# Patient Record
Sex: Male | Born: 1950
Health system: Southern US, Community
[De-identification: ages and names within clinical notes are randomized; demographics above are authoritative.]

## PROBLEM LIST (undated history)

## (undated) ENCOUNTER — Encounter: Attending: Hematology & Oncology | Primary: Hematology & Oncology

## (undated) ENCOUNTER — Encounter

## (undated) ENCOUNTER — Telehealth

## (undated) ENCOUNTER — Ambulatory Visit

## (undated) ENCOUNTER — Ambulatory Visit: Payer: MEDICARE

## (undated) ENCOUNTER — Telehealth: Attending: Hospitalist | Primary: Hospitalist

## (undated) ENCOUNTER — Ambulatory Visit: Attending: Family | Primary: Family

## (undated) ENCOUNTER — Telehealth: Attending: Pharmacist | Primary: Pharmacist

## (undated) ENCOUNTER — Encounter: Attending: Pharmacist | Primary: Pharmacist

## (undated) ENCOUNTER — Inpatient Hospital Stay

## (undated) ENCOUNTER — Telehealth: Attending: Emergency Medicine | Primary: Emergency Medicine

## (undated) ENCOUNTER — Encounter: Attending: Emergency Medicine | Primary: Emergency Medicine

## (undated) ENCOUNTER — Telehealth: Attending: Hematology & Oncology | Primary: Hematology & Oncology

## (undated) ENCOUNTER — Ambulatory Visit: Payer: MEDICARE | Attending: Pharmacist | Primary: Pharmacist

## (undated) ENCOUNTER — Encounter: Attending: Infectious Disease | Primary: Infectious Disease

## (undated) DIAGNOSIS — N183 Chronic kidney disease, stage 3 (moderate): Secondary | ICD-10-CM

## (undated) DIAGNOSIS — C9500 Acute leukemia of unspecified cell type not having achieved remission: Secondary | ICD-10-CM

## (undated) DIAGNOSIS — I10 Essential (primary) hypertension: Secondary | ICD-10-CM

## (undated) DIAGNOSIS — E785 Hyperlipidemia, unspecified: Secondary | ICD-10-CM

## (undated) DIAGNOSIS — I639 Cerebral infarction, unspecified: Secondary | ICD-10-CM

## (undated) DIAGNOSIS — E119 Type 2 diabetes mellitus without complications: Secondary | ICD-10-CM

## (undated) HISTORY — DX: Hyperlipidemia, unspecified: E78.5

## (undated) HISTORY — DX: Type 2 diabetes mellitus without complications: E11.9

## (undated) HISTORY — DX: Essential (primary) hypertension: I10

## (undated) HISTORY — DX: Cerebral infarction, unspecified: I63.9

## (undated) HISTORY — PX: TONSILLECTOMY: SUR1361

## (undated) HISTORY — DX: Chronic kidney disease, stage 3 (moderate): N18.3

## (undated) HISTORY — PX: CATARACT EXTRACTION: SUR2

---

## 2007-11-14 ENCOUNTER — Inpatient Hospital Stay: Payer: Self-pay | Admitting: Internal Medicine

## 2007-11-14 ENCOUNTER — Other Ambulatory Visit: Payer: Self-pay

## 2011-03-14 ENCOUNTER — Inpatient Hospital Stay: Payer: Self-pay | Admitting: Internal Medicine

## 2011-04-28 ENCOUNTER — Emergency Department: Payer: Self-pay | Admitting: Emergency Medicine

## 2011-06-21 ENCOUNTER — Emergency Department: Payer: Self-pay | Admitting: Unknown Physician Specialty

## 2013-07-10 ENCOUNTER — Emergency Department: Payer: Self-pay | Admitting: Emergency Medicine

## 2013-07-10 LAB — COMPREHENSIVE METABOLIC PANEL
Albumin: 3.9 g/dL (ref 3.4–5.0)
Alkaline Phosphatase: 78 U/L (ref 50–136)
Anion Gap: 8 (ref 7–16)
BUN: 14 mg/dL (ref 7–18)
Bilirubin,Total: 0.4 mg/dL (ref 0.2–1.0)
Calcium, Total: 9.2 mg/dL (ref 8.5–10.1)
Co2: 26 mmol/L (ref 21–32)
Creatinine: 1.14 mg/dL (ref 0.60–1.30)
Glucose: 229 mg/dL — ABNORMAL HIGH (ref 65–99)
Potassium: 3.9 mmol/L (ref 3.5–5.1)
SGOT(AST): 14 U/L — ABNORMAL LOW (ref 15–37)
Sodium: 135 mmol/L — ABNORMAL LOW (ref 136–145)

## 2013-07-10 LAB — URINALYSIS, COMPLETE
Bilirubin,UR: NEGATIVE
Glucose,UR: >=500 mg/dL (ref 0–75)
Leukocyte Esterase: NEGATIVE
Ph: 5 (ref 4.5–8.0)
RBC,UR: <1 /HPF (ref 0–5)
Specific Gravity: 1.021 (ref 1.003–1.030)

## 2013-07-10 LAB — CBC
HCT: 40.2 % (ref 40.0–52.0)
HGB: 14.6 g/dL (ref 13.0–18.0)
MCV: 93 fL (ref 80–100)
Platelet: 241 10*3/uL (ref 150–440)
RBC: 4.31 10*6/uL — ABNORMAL LOW (ref 4.40–5.90)
RDW: 12.9 % (ref 11.5–14.5)
WBC: 5.5 10*3/uL (ref 3.8–10.6)

## 2013-09-25 ENCOUNTER — Ambulatory Visit: Payer: Self-pay | Admitting: Ophthalmology

## 2014-03-10 ENCOUNTER — Ambulatory Visit: Payer: Self-pay | Admitting: Family Medicine

## 2014-04-04 ENCOUNTER — Ambulatory Visit: Payer: Self-pay | Admitting: Family Medicine

## 2014-05-05 ENCOUNTER — Ambulatory Visit: Payer: Self-pay | Admitting: Family Medicine

## 2014-08-05 ENCOUNTER — Ambulatory Visit: Payer: Self-pay | Admitting: Family Medicine

## 2014-12-29 DIAGNOSIS — I10 Essential (primary) hypertension: Secondary | ICD-10-CM | POA: Diagnosis not present

## 2014-12-29 DIAGNOSIS — Z1389 Encounter for screening for other disorder: Secondary | ICD-10-CM | POA: Diagnosis not present

## 2014-12-29 DIAGNOSIS — E782 Mixed hyperlipidemia: Secondary | ICD-10-CM | POA: Diagnosis not present

## 2014-12-29 DIAGNOSIS — I6930 Unspecified sequelae of cerebral infarction: Secondary | ICD-10-CM | POA: Diagnosis not present

## 2014-12-29 DIAGNOSIS — E1140 Type 2 diabetes mellitus with diabetic neuropathy, unspecified: Secondary | ICD-10-CM | POA: Diagnosis not present

## 2015-04-02 DIAGNOSIS — I10 Essential (primary) hypertension: Secondary | ICD-10-CM | POA: Diagnosis not present

## 2015-04-02 DIAGNOSIS — Z1389 Encounter for screening for other disorder: Secondary | ICD-10-CM | POA: Diagnosis not present

## 2015-04-02 DIAGNOSIS — N183 Chronic kidney disease, stage 3 (moderate): Secondary | ICD-10-CM | POA: Diagnosis not present

## 2015-04-02 DIAGNOSIS — Z9181 History of falling: Secondary | ICD-10-CM | POA: Diagnosis not present

## 2015-04-02 DIAGNOSIS — Z1211 Encounter for screening for malignant neoplasm of colon: Secondary | ICD-10-CM | POA: Diagnosis not present

## 2015-04-02 DIAGNOSIS — E782 Mixed hyperlipidemia: Secondary | ICD-10-CM | POA: Diagnosis not present

## 2015-04-02 DIAGNOSIS — R5383 Other fatigue: Secondary | ICD-10-CM | POA: Diagnosis not present

## 2015-04-02 DIAGNOSIS — E1140 Type 2 diabetes mellitus with diabetic neuropathy, unspecified: Secondary | ICD-10-CM | POA: Diagnosis not present

## 2015-04-28 DIAGNOSIS — Z008 Encounter for other general examination: Secondary | ICD-10-CM | POA: Diagnosis not present

## 2015-05-22 ENCOUNTER — Encounter: Payer: Self-pay | Admitting: Family Medicine

## 2015-05-22 ENCOUNTER — Ambulatory Visit (INDEPENDENT_AMBULATORY_CARE_PROVIDER_SITE_OTHER): Payer: Commercial Managed Care - HMO | Admitting: Family Medicine

## 2015-05-22 VITALS — BP 112/78 | HR 78 | Temp 97.8°F | Resp 16 | Ht 70.0 in | Wt 180.4 lb

## 2015-05-22 DIAGNOSIS — H2000 Unspecified acute and subacute iridocyclitis: Secondary | ICD-10-CM | POA: Diagnosis not present

## 2015-05-22 DIAGNOSIS — E1142 Type 2 diabetes mellitus with diabetic polyneuropathy: Secondary | ICD-10-CM | POA: Insufficient documentation

## 2015-05-22 DIAGNOSIS — E1169 Type 2 diabetes mellitus with other specified complication: Secondary | ICD-10-CM | POA: Insufficient documentation

## 2015-05-22 DIAGNOSIS — I10 Essential (primary) hypertension: Secondary | ICD-10-CM | POA: Insufficient documentation

## 2015-05-22 DIAGNOSIS — E785 Hyperlipidemia, unspecified: Secondary | ICD-10-CM

## 2015-05-22 DIAGNOSIS — H5713 Ocular pain, bilateral: Secondary | ICD-10-CM | POA: Diagnosis not present

## 2015-05-22 NOTE — Patient Instructions (Signed)
I made you an appt at [facility] at 115 pm today.

## 2015-05-22 NOTE — Progress Notes (Signed)
Subjective:    Patient ID: Richard Gordon, male    DOB: 1951-07-05, 64 y.o.   MRN: [id number]  HPI: Richard Gordon is a 64 y.o. male presenting on 05/22/2015 for Eye Pain   Eye Pain  Both eyes are affected.This is a new problem. The current episode started yesterday. The problem occurs intermittently. The problem has been waxing and waning. There was no injury mechanism. The pain is at a severity of 5/10. There is no known exposure to pink eye. Associated symptoms include eye redness and photophobia. Pertinent negatives include no blurred vision, eye discharge, fever, foreign body sensation, itching, recent URI or vomiting.   Eye pain began last night. Occurs when pt looks as bright light. No exudate or itching from the eye. No watering, no foreign body sensation. Pt has eye exam scheduled on 6/28.     Past Medical History  Diagnosis Date  . Diabetes mellitus without complication   . Hypertension   . Hyperlipidemia     No current outpatient prescriptions on file prior to visit.   No current facility-administered medications on file prior to visit.    Review of Systems  Constitutional: Negative for fever and chills.  HENT: Negative.   Eyes: Positive for photophobia, pain and redness. Negative for blurred vision and discharge.  Respiratory: Negative for chest tightness and shortness of breath.   Cardiovascular: Negative for chest pain, palpitations and leg swelling.  Gastrointestinal: Negative for vomiting.  Genitourinary: Negative.   Musculoskeletal: Negative.   Skin: Negative for itching.  Neurological: Negative for dizziness and light-headedness.   Per HPI unless specifically indicated above     Objective:    BP 112/78 mmHg  Pulse 78  Temp(Src) 97.8 F (36.6 C) (Oral)  Resp 16  Ht 5' 10" (1.778 m)  Wt 180 lb 6.4 oz (81.829 kg)  BMI 25.88 kg/m2  Wt Readings from Last 3 Encounters:  05/22/15 180 lb 6.4 oz (81.829 kg)    Physical Exam  Constitutional: He appears  well-developed and well-nourished. No distress.  Eyes: EOM and lids are normal. Pupils are equal, round, and reactive to light. No scleral icterus. Right eye exhibits no nystagmus. Left eye exhibits no nystagmus.  Both eyes mildly red. No exudate.   Cardiovascular: Normal rate and regular rhythm.  Exam reveals no gallop and no friction rub.   No murmur heard. Skin: He is not diaphoretic.   No results found for this or any previous visit.    Assessment & Plan:   Problem List Items Addressed This Visit    None    Visit Diagnoses    Eye pain, bilateral    -  Primary    No sign of infection. Due to eye pain and light sensitivity, refer to [facility] for evaluation.     Relevant Orders    Ambulatory referral to Ophthalmology       Meds ordered this encounter  Medications  . glimepiride (AMARYL) 4 MG tablet    Sig: Take 4 mg by mouth daily with breakfast.  . simvastatin (ZOCOR) 20 MG tablet    Sig: Take 20 mg by mouth daily.  . hydrochlorothiazide (HYDRODIURIL) 25 MG tablet    Sig: Take 25 mg by mouth daily.  Marland Kitchen lisinopril (PRINIVIL,ZESTRIL) 20 MG tablet    Sig: Take 20 mg by mouth daily.  . metFORMIN (GLUCOPHAGE) 1000 MG tablet    Sig: Take 1,000 mg by mouth 2 (two) times daily with a meal.  .  aspirin 325 MG tablet    Sig: Take 325 mg by mouth daily.      Follow up plan: Return if symptoms worsen or fail to improve.

## 2015-07-09 ENCOUNTER — Encounter: Payer: Self-pay | Admitting: Family Medicine

## 2015-07-09 ENCOUNTER — Ambulatory Visit (INDEPENDENT_AMBULATORY_CARE_PROVIDER_SITE_OTHER): Payer: Commercial Managed Care - HMO | Admitting: Family Medicine

## 2015-07-09 VITALS — BP 136/64 | HR 72 | Resp 16 | Ht 69.0 in | Wt 183.0 lb

## 2015-07-09 DIAGNOSIS — E119 Type 2 diabetes mellitus without complications: Secondary | ICD-10-CM

## 2015-07-09 DIAGNOSIS — E785 Hyperlipidemia, unspecified: Secondary | ICD-10-CM | POA: Diagnosis not present

## 2015-07-09 DIAGNOSIS — I10 Essential (primary) hypertension: Secondary | ICD-10-CM | POA: Diagnosis not present

## 2015-07-09 DIAGNOSIS — E1169 Type 2 diabetes mellitus with other specified complication: Secondary | ICD-10-CM | POA: Insufficient documentation

## 2015-07-09 LAB — POCT GLYCOSYLATED HEMOGLOBIN (HGB A1C): Hemoglobin A1C: 6.7

## 2015-07-09 NOTE — Assessment & Plan Note (Signed)
Reduce statin to 75m daily due to very low LDL (27).  Recheck at next visit.  Continue to encourage the heart healthy diet.

## 2015-07-09 NOTE — Assessment & Plan Note (Addendum)
Controlled continue current medication regimen. Recheck A1c in 3 mos. Step back medications  If continues to drop.  Eye exam UTD. Foot exam UTD. ACE/ARB for renal protection.

## 2015-07-09 NOTE — Progress Notes (Signed)
Subjective:    Patient ID: Richard Gordon, male    DOB: November 02, 1951, 64 y.o.   MRN: [id number]  HPI: Richard Gordon is a 64 y.o. male presenting on 07/09/2015 for Follow-up and Diabetes   Diabetes He presents for his follow-up diabetic visit. He has type 2 diabetes mellitus. His disease course has been improving. There are no hypoglycemic associated symptoms. Pertinent negatives for hypoglycemia include no headaches. Pertinent negatives for diabetes include no chest pain, no fatigue, no foot paresthesias, no polydipsia, no polyphagia and no polyuria. Diabetic complications include a CVA. He is following a diabetic diet. His overall blood glucose range is 130-140 mg/dl. An ACE inhibitor/angiotensin II receptor blocker is being taken. He does not see a podiatrist.Eye exam is current.    Past Medical History  Diagnosis Date  . Hypertension   . Hyperlipidemia   . Diabetes mellitus without complication   . Stroke     No current outpatient prescriptions on file prior to visit.   No current facility-administered medications on file prior to visit.    Review of Systems  Constitutional: Negative for fever, chills and fatigue.  Respiratory: Negative for chest tightness, shortness of breath and wheezing.   Cardiovascular: Negative for chest pain.  Gastrointestinal: Negative.  Negative for nausea, vomiting and abdominal pain.  Endocrine: Negative for cold intolerance, heat intolerance, polydipsia, polyphagia and polyuria.  Genitourinary: Negative for dysuria.  Musculoskeletal: Negative.   Neurological: Negative for light-headedness, numbness and headaches.  Psychiatric/Behavioral: Negative.    Per HPI unless specifically indicated above     Objective:    BP 136/64 mmHg  Pulse 72  Resp 16  Ht 5' 9" (1.753 m)  Wt 183 lb (83.008 kg)  BMI 27.01 kg/m2  Wt Readings from Last 3 Encounters:  07/09/15 183 lb (83.008 kg)    Physical Exam  Constitutional: He is oriented to person, place, and  time. He appears well-developed and well-nourished. No distress.  Neck: Normal range of motion. Neck supple. No thyromegaly present.  Cardiovascular: Normal rate and regular rhythm.  Exam reveals no gallop and no friction rub.   No murmur heard. Pulmonary/Chest: Effort normal and breath sounds normal.  Abdominal: Soft. Bowel sounds are normal. There is no tenderness. There is no rebound.  Musculoskeletal: Normal range of motion. He exhibits no edema or tenderness.  Lymphadenopathy:    He has no cervical adenopathy.  Neurological: He is alert and oriented to person, place, and time.  Skin: Skin is warm and dry. He is not diaphoretic.   Diabetic Foot Exam - Simple   Simple Foot Form  Diabetic Foot exam was performed with the following findings:  Yes 07/09/2015  3:37 PM  Visual Inspection  No deformities, no ulcerations, no other skin breakdown bilaterally:  Yes  Sensation Testing  Intact to touch and monofilament testing bilaterally:  Yes  Pulse Check  Posterior Tibialis and Dorsalis pulse intact bilaterally:  Yes  Comments      Results for orders placed or performed in visit on 07/09/15  POCT HgB A1C  Result Value Ref Range   Hemoglobin A1C 6.7       Assessment & Plan:   Problem List Items Addressed This Visit      Cardiovascular and Mediastinum   Hypertension    Controlled- continue current regimen.       Relevant Medications   hydrochlorothiazide (HYDRODIURIL) 25 MG tablet   lisinopril (PRINIVIL,ZESTRIL) 20 MG tablet   simvastatin (ZOCOR) 20 MG tablet   Other Relevant  Orders   Basic Metabolic Panel (BMET)     Endocrine   Diabetes type 2, controlled - Primary    Controlled continue current medication regimen. Recheck A1c in 3 mos. Step back medications  If continues to drop.  Eye exam UTD. Foot exam UTD. ACE/ARB for renal protection.       Relevant Medications   glimepiride (AMARYL) 4 MG tablet   lisinopril (PRINIVIL,ZESTRIL) 20 MG tablet   metFORMIN  (GLUCOPHAGE) 1000 MG tablet   simvastatin (ZOCOR) 20 MG tablet   Other Relevant Orders   POCT HgB A1C (Completed)   Basic Metabolic Panel (BMET)     Other   Hyperlipidemia    Reduce statin to 7m daily due to very low LDL (27).  Recheck at next visit.  Continue to encourage the heart healthy diet.       Relevant Medications   hydrochlorothiazide (HYDRODIURIL) 25 MG tablet   lisinopril (PRINIVIL,ZESTRIL) 20 MG tablet   simvastatin (ZOCOR) 20 MG tablet      Meds ordered this encounter  Medications  . glimepiride (AMARYL) 4 MG tablet    Sig:   . hydrochlorothiazide (HYDRODIURIL) 25 MG tablet    Sig:   . lisinopril (PRINIVIL,ZESTRIL) 20 MG tablet    Sig:   . metFORMIN (GLUCOPHAGE) 1000 MG tablet    Sig:   . simvastatin (ZOCOR) 20 MG tablet    Sig:   . Vitamins A & D 5000-400 UNITS CAPS    Sig:       Follow up plan: Return in about 3 months (around 10/09/2015), or if symptoms worsen or fail to improve.

## 2015-07-09 NOTE — Assessment & Plan Note (Signed)
Controlled- continue current regimen.

## 2015-07-09 NOTE — Patient Instructions (Signed)
Cholesterol: Take 1/2 each evening for your cholesterol. And We will recheck in November.  Great job on getting your A1c down to 6.7%  Dr. Dorothey Baseman Office: [facility] [place] [phone]

## 2015-07-10 LAB — BASIC METABOLIC PANEL
BUN / CREAT RATIO: 16 (ref 10–22)
BUN: 23 mg/dL (ref 8–27)
CO2: 23 mmol/L (ref 18–29)
CREATININE: 1.42 mg/dL — AB (ref 0.76–1.27)
Calcium: 10.4 mg/dL — ABNORMAL HIGH (ref 8.6–10.2)
Chloride: 98 mmol/L (ref 97–108)
GFR calc Af Amer: 59 mL/min/{1.73_m2} — ABNORMAL LOW (ref >59)
GFR calc non Af Amer: 51 mL/min/{1.73_m2} — ABNORMAL LOW (ref >59)
Glucose: 133 mg/dL — ABNORMAL HIGH (ref 65–99)
Potassium: 4.7 mmol/L (ref 3.5–5.2)
SODIUM: 141 mmol/L (ref 134–144)

## 2015-08-14 ENCOUNTER — Encounter: Payer: Self-pay | Admitting: Family Medicine

## 2015-08-28 ENCOUNTER — Other Ambulatory Visit: Payer: Self-pay | Admitting: Family Medicine

## 2015-11-03 ENCOUNTER — Ambulatory Visit (INDEPENDENT_AMBULATORY_CARE_PROVIDER_SITE_OTHER): Payer: Commercial Managed Care - HMO | Admitting: Family Medicine

## 2015-11-03 ENCOUNTER — Encounter: Payer: Self-pay | Admitting: Family Medicine

## 2015-11-03 VITALS — BP 114/72 | HR 83 | Temp 98.3°F | Resp 16 | Ht 70.0 in | Wt 185.0 lb

## 2015-11-03 DIAGNOSIS — Z23 Encounter for immunization: Secondary | ICD-10-CM

## 2015-11-03 DIAGNOSIS — E1142 Type 2 diabetes mellitus with diabetic polyneuropathy: Secondary | ICD-10-CM | POA: Diagnosis not present

## 2015-11-03 DIAGNOSIS — E785 Hyperlipidemia, unspecified: Secondary | ICD-10-CM

## 2015-11-03 DIAGNOSIS — N183 Chronic kidney disease, stage 3 unspecified: Secondary | ICD-10-CM

## 2015-11-03 DIAGNOSIS — I6930 Unspecified sequelae of cerebral infarction: Secondary | ICD-10-CM | POA: Diagnosis not present

## 2015-11-03 DIAGNOSIS — R5383 Other fatigue: Secondary | ICD-10-CM | POA: Insufficient documentation

## 2015-11-03 DIAGNOSIS — E1169 Type 2 diabetes mellitus with other specified complication: Secondary | ICD-10-CM

## 2015-11-03 DIAGNOSIS — I129 Hypertensive chronic kidney disease with stage 1 through stage 4 chronic kidney disease, or unspecified chronic kidney disease: Secondary | ICD-10-CM

## 2015-11-03 DIAGNOSIS — L209 Atopic dermatitis, unspecified: Secondary | ICD-10-CM | POA: Insufficient documentation

## 2015-11-03 HISTORY — DX: Chronic kidney disease, stage 3 unspecified: N18.30

## 2015-11-03 LAB — POCT GLYCOSYLATED HEMOGLOBIN (HGB A1C): HEMOGLOBIN A1C: 7.7

## 2015-11-03 MED ORDER — SITAGLIPTIN PHOSPHATE 100 MG PO TABS: 100.0000 mg | ORAL_TABLET | Freq: Every day | ORAL | Status: DC

## 2015-11-03 MED ORDER — ALPHA-LIPOIC ACID 600 MG PO CAPS: 1.0000 | ORAL_CAPSULE | Freq: Every day | ORAL | Status: DC

## 2015-11-03 NOTE — Assessment & Plan Note (Signed)
A1C is elevated. Continue metformin. Change Amaryl to Januvia to help control A1c. Encouraged pt to make diet changes to help control A1c. Encouraged carb counting and avoiding concentrated sweets. Encouraged continued exercise.  ACE for renal protection. Eye exam UTD. Foot exam done.  RTC 3 mos.

## 2015-11-03 NOTE — Patient Instructions (Addendum)
I have changed your diabetes medications to see if we can reduce your A1c.  We also need to change a few things in your diet- Reduce the number of times you are going to the grill. Increase veggies and reduce breads, rice, and pasta. Watch concentrated sweets such as cakes and cookies.   Diabetes Mellitus and Food It is important for you to manage your blood sugar (glucose) level. Your blood glucose level can be greatly affected by what you eat. Eating healthier foods in the appropriate amounts throughout the day at about the same time each day will help you control your blood glucose level. It can also help slow or prevent worsening of your diabetes mellitus. Healthy eating may even help you improve the level of your blood pressure and reach or maintain a healthy weight.  General recommendations for healthful eating and cooking habits include:  Eating meals and snacks regularly. Avoid going long periods of time without eating to lose weight.  Eating a diet that consists mainly of plant-based foods, such as fruits, vegetables, nuts, legumes, and whole grains.  Using low-heat cooking methods, such as baking, instead of high-heat cooking methods, such as deep frying. Work with your dietitian to make sure you understand how to use the Nutrition Facts information on food labels. HOW CAN FOOD AFFECT ME? Carbohydrates Carbohydrates affect your blood glucose level more than any other type of food. Your dietitian will help you determine how many carbohydrates to eat at each meal and teach you how to count carbohydrates. Counting carbohydrates is important to keep your blood glucose at a healthy level, especially if you are using insulin or taking certain medicines for diabetes mellitus. Alcohol Alcohol can cause sudden decreases in blood glucose (hypoglycemia), especially if you use insulin or take certain medicines for diabetes mellitus. Hypoglycemia can be a life-threatening condition. Symptoms of  hypoglycemia (sleepiness, dizziness, and disorientation) are similar to symptoms of having too much alcohol.  If your health care provider has given you approval to drink alcohol, do so in moderation and use the following guidelines:  Women should not have more than one drink per day, and men should not have more than two drinks per day. One drink is equal to:  12 oz of beer.  5 oz of wine.  1 oz of hard liquor.  Do not drink on an empty stomach.  Keep yourself hydrated. Have water, diet soda, or unsweetened iced tea.  Regular soda, juice, and other mixers might contain a lot of carbohydrates and should be counted. WHAT FOODS ARE NOT RECOMMENDED? As you make food choices, it is important to remember that all foods are not the same. Some foods have fewer nutrients per serving than other foods, even though they might have the same number of calories or carbohydrates. It is difficult to get your body what it needs when you eat foods with fewer nutrients. Examples of foods that you should avoid that are high in calories and carbohydrates but low in nutrients include:  Trans fats (most processed foods list trans fats on the Nutrition Facts label).  Regular soda.  Juice.  Candy.  Sweets, such as cake, pie, doughnuts, and cookies.  Fried foods. WHAT FOODS CAN I EAT? Eat nutrient-rich foods, which will nourish your body and keep you healthy. The food you should eat also will depend on several factors, including:  The calories you need.  The medicines you take.  Your weight.  Your blood glucose level.  Your blood pressure level.  Your cholesterol level. You should eat a variety of foods, including:  Protein.  Lean cuts of meat.  Proteins low in saturated fats, such as fish, egg whites, and beans. Avoid processed meats.  Fruits and vegetables.  Fruits and vegetables that may help control blood glucose levels, such as apples, mangoes, and yams.  Dairy products.  Choose  fat-free or low-fat dairy products, such as milk, yogurt, and cheese.  Grains, bread, pasta, and rice.  Choose whole grain products, such as multigrain bread, whole oats, and brown rice. These foods may help control blood pressure.  Fats.  Foods containing healthful fats, such as nuts, avocado, olive oil, canola oil, and fish. DOES EVERYONE WITH DIABETES MELLITUS HAVE THE SAME MEAL PLAN? Because every person with diabetes mellitus is different, there is not one meal plan that works for everyone. It is very important that you meet with a dietitian who will help you create a meal plan that is just right for you.   This information is not intended to replace advice given to you by your health care provider. Make sure you discuss any questions you have with your health care provider.   Document Released: 08/18/2005 Document Revised: 12/12/2014 Document Reviewed: 10/18/2013 Elsevier Interactive Patient Education [employer].

## 2015-11-03 NOTE — Progress Notes (Signed)
Subjective:    Patient ID: Richard Gordon, male    DOB: 02/07/1951, 64 y.o.   MRN: [id number]  HPI: Richard Gordon is a 64 y.o. male presenting on 11/03/2015 for Hypertension and Diabetes   Hypertension Pertinent negatives include no chest pain, headaches or shortness of breath.  Diabetes He presents for his follow-up diabetic visit. He has type 2 diabetes mellitus. His disease course has been fluctuating. There are no hypoglycemic associated symptoms. Pertinent negatives for hypoglycemia include no headaches. Associated symptoms include foot paresthesias. Pertinent negatives for diabetes include no chest pain, no fatigue, no polydipsia, no polyphagia, no polyuria, no visual change, no weakness and no weight loss. Symptoms are worsening. Risk factors for coronary artery disease include dyslipidemia, hypertension, male sex and diabetes mellitus. Current diabetic treatment includes oral agent (dual therapy). His overall blood glucose range is 140-180 mg/dl.    Pt presents for diabetes follow-up. Last A1c was 6.7%.  Taking glimepiride 23m daily and metformin.  Pt reports he has changed his diet, not eating as healthy. Eating out 4 times per week.  Sugars avg 120-135- checks in the morning and the afternoon. Numbness in toes is increased due to cold weather.    Past Medical History  Diagnosis Date  . Hypertension   . Hyperlipidemia   . Diabetes mellitus without complication ([place]   . Stroke ([facility]     Current Outpatient Prescriptions on File Prior to Visit  Medication Sig  . aspirin 325 MG tablet Take 325 mg by mouth daily.  . hydrochlorothiazide (HYDRODIURIL) 25 MG tablet   . hydrochlorothiazide (HYDRODIURIL) 25 MG tablet Take 25 mg by mouth daily.  .Marland Kitchenlisinopril (PRINIVIL,ZESTRIL) 20 MG tablet   . lisinopril (PRINIVIL,ZESTRIL) 20 MG tablet Take 20 mg by mouth daily.  . metFORMIN (GLUCOPHAGE) 1000 MG tablet Take 1,000 mg by mouth 2 (two) times daily with a meal.  . simvastatin (ZOCOR) 20  MG tablet   . simvastatin (ZOCOR) 20 MG tablet Take 20 mg by mouth daily.  . Vitamins A & D 5000-400 UNITS CAPS    No current facility-administered medications on file prior to visit.    Review of Systems  Constitutional: Negative for fever, chills, weight loss and fatigue.  Respiratory: Negative for chest tightness, shortness of breath and wheezing.   Cardiovascular: Negative for chest pain.  Gastrointestinal: Negative.   Endocrine: Negative for cold intolerance, heat intolerance, polydipsia, polyphagia and polyuria.  Genitourinary: Negative.   Musculoskeletal: Negative.  Negative for myalgias and arthralgias.  Neurological: Negative for weakness, light-headedness, numbness and headaches.  Psychiatric/Behavioral: Negative.    Per HPI unless specifically indicated above     Objective:    BP 114/72 mmHg  Pulse 83  Temp(Src) 98.3 F (36.8 C) (Oral)  Resp 16  Ht 5' 10" (1.778 m)  Wt 185 lb (83.915 kg)  BMI 26.54 kg/m2  Wt Readings from Last 3 Encounters:  11/03/15 185 lb (83.915 kg)  07/09/15 183 lb (83.008 kg)  05/22/15 180 lb 6.4 oz (81.829 kg)    Physical Exam  Constitutional: He is oriented to person, place, and time. He appears well-developed and well-nourished. No distress.  Neck: Normal range of motion. Neck supple. No thyromegaly present.  Cardiovascular: Normal rate and regular rhythm.  Exam reveals no gallop and no friction rub.   No murmur heard. Pulmonary/Chest: Effort normal and breath sounds normal.  Abdominal: Soft. Bowel sounds are normal. There is no tenderness. There is no rebound.  Musculoskeletal: Normal range of motion.  He exhibits no edema or tenderness.  Lymphadenopathy:    He has no cervical adenopathy.  Neurological: He is alert and oriented to person, place, and time.  Skin: Skin is warm and dry. He is not diaphoretic.   Results for orders placed or performed in visit on 11/03/15  POCT HgB A1C  Result Value Ref Range   Hemoglobin A1C 7.7     Diabetic Foot Exam - Simple   Simple Foot Form  Diabetic Foot exam was performed with the following findings:  Yes 11/03/2015  2:05 PM  Visual Inspection  No deformities, no ulcerations, no other skin breakdown bilaterally:  Yes  Sensation Testing  Intact to touch and monofilament testing bilaterally:  Yes  Pulse Check  Posterior Tibialis and Dorsalis pulse intact bilaterally:  Yes  Comments         Assessment & Plan:   Problem List Items Addressed This Visit      Cardiovascular and Mediastinum   Hypertension     Nervous and Auditory   DM type 2 with diabetic peripheral neuropathy ([name])    A1C is elevated. Continue metformin. Change Amaryl to Januvia to help control A1c. Encouraged pt to make diet changes to help control A1c. Encouraged carb counting and avoiding concentrated sweets. Encouraged continued exercise.  ACE for renal protection. Eye exam UTD. Foot exam done.  RTC 3 mos.       Relevant Medications   sitaGLIPtin (JANUVIA) 100 MG tablet   Alpha-Lipoic Acid 600 MG CAPS   Other Relevant Orders   POCT HgB A1C (Completed)     Genitourinary   Chronic kidney disease (CKD), stage III (moderate)    Advised to avoid NSAIDs. Check BMP today. ACE for renal protection.         Other   Hyperlipidemia associated with type 2 diabetes mellitus ([place])    Check lipid panel today. Continue statin. Aspirin for ASCVD prevention.       Relevant Medications   sitaGLIPtin (JANUVIA) 100 MG tablet   Other Relevant Orders   Lipid panel   Personal history of stroke with residual effects - Primary    Secondary prevention with aspirin and statin.        Other Visit Diagnoses    Need for Streptococcus pneumoniae vaccination        Relevant Orders    Pneumococcal conjugate vaccine 13-valent (Completed)       Meds ordered this encounter  Medications  . Omega-3 Fatty Acids (FISH OIL) 1200 MG CAPS    Sig: Take by mouth.  . sitaGLIPtin (JANUVIA) 100 MG tablet    Sig: Take 1  tablet (100 mg total) by mouth daily.    Dispense:  90 tablet    Refill:  3    Order Specific Question:  Supervising Provider    Answer:  Arlis Porta [phone]  . Alpha-Lipoic Acid 600 MG CAPS    Sig: Take 1 capsule (600 mg total) by mouth daily.    Dispense:  90 each    Refill:  3    Order Specific Question:  Supervising Provider    Answer:  Arlis Porta [[id number]]      Follow up plan: Return in about 3 months (around 03-01-2016).

## 2015-11-03 NOTE — Assessment & Plan Note (Signed)
Check lipid panel today. Continue statin. Aspirin for ASCVD prevention.

## 2015-11-03 NOTE — Assessment & Plan Note (Signed)
Secondary prevention with aspirin and statin.

## 2015-11-03 NOTE — Assessment & Plan Note (Addendum)
Advised to avoid NSAIDs. Check BMP today. ACE for renal protection.

## 2015-11-12 DIAGNOSIS — I10 Essential (primary) hypertension: Secondary | ICD-10-CM | POA: Diagnosis not present

## 2015-11-12 DIAGNOSIS — E785 Hyperlipidemia, unspecified: Secondary | ICD-10-CM | POA: Diagnosis not present

## 2015-11-13 ENCOUNTER — Telehealth: Payer: Self-pay | Admitting: Family Medicine

## 2015-11-13 DIAGNOSIS — E785 Hyperlipidemia, unspecified: Secondary | ICD-10-CM

## 2015-11-13 LAB — LIPID PANEL
CHOL/HDL RATIO: 12.5 ratio — AB (ref 0.0–5.0)
Cholesterol, Total: 150 mg/dL (ref 100–199)
HDL: 12 mg/dL — AB (ref >39)
Triglycerides: 1141 mg/dL (ref 0–149)

## 2015-11-13 LAB — BASIC METABOLIC PANEL
BUN / CREAT RATIO: 21 (ref 10–22)
BUN: 24 mg/dL (ref 8–27)
CO2: 20 mmol/L (ref 18–29)
Calcium: 9.2 mg/dL (ref 8.6–10.2)
Chloride: 99 mmol/L (ref 97–106)
Creatinine, Ser: 1.17 mg/dL (ref 0.76–1.27)
GFR calc non Af Amer: 65 mL/min/{1.73_m2} (ref >59)
GFR, EST AFRICAN AMERICAN: 75 mL/min/{1.73_m2} (ref >59)
Glucose: 290 mg/dL — ABNORMAL HIGH (ref 65–99)
Potassium: 4.5 mmol/L (ref 3.5–5.2)
Sodium: 139 mmol/L (ref 136–144)

## 2015-11-13 NOTE — Telephone Encounter (Signed)
Lab orders placed.

## 2015-11-17 ENCOUNTER — Telehealth: Payer: Self-pay | Admitting: Family Medicine

## 2015-11-17 DIAGNOSIS — E1142 Type 2 diabetes mellitus with diabetic polyneuropathy: Secondary | ICD-10-CM

## 2015-11-17 NOTE — Telephone Encounter (Signed)
Pt said [facility] mail order pharmacy does not have alpha lipoic acid.  The sitagliptin 100 mg is too expensive so he wants to go back to the glimepiride 4 mg.  His call back number is [phone].

## 2015-11-18 MED ORDER — GLIMEPIRIDE 4 MG PO TABS: 8.0000 mg | ORAL_TABLET | Freq: Every day | ORAL | Status: DC

## 2015-11-18 NOTE — Telephone Encounter (Signed)
Please let Richard Gordon know I have sent in the dose of glimepiride 53m (take 2 pills daily) to his mail order pharmacy. He can get alpha lipoic acid over the counter. He can even order it on [employer] I can also try to send it to his local pharmacy to see if that's available.   Thanks! AK

## 2015-11-18 NOTE — Telephone Encounter (Signed)
Left detailed message on [email or web address]

## 2015-11-24 ENCOUNTER — Telehealth: Payer: Self-pay | Admitting: Family Medicine

## 2015-11-24 NOTE — Telephone Encounter (Signed)
Pt said he received to [place] for the mail order pharmacy at no charge so he did decide to take it instead of the other medication.

## 2015-11-24 NOTE — Telephone Encounter (Signed)
That is okay. He can continue Januvia. I will cancel the glimepiride. Thanks! AK

## 2015-12-29 ENCOUNTER — Other Ambulatory Visit: Payer: Self-pay | Admitting: Family Medicine

## 2016-01-15 ENCOUNTER — Other Ambulatory Visit: Payer: Self-pay | Admitting: Family Medicine

## 2016-02-02 ENCOUNTER — Ambulatory Visit: Payer: Self-pay | Admitting: Family Medicine

## 2016-02-04 ENCOUNTER — Ambulatory Visit (INDEPENDENT_AMBULATORY_CARE_PROVIDER_SITE_OTHER): Payer: Commercial Managed Care - HMO | Admitting: Family Medicine

## 2016-02-04 DIAGNOSIS — I10 Essential (primary) hypertension: Secondary | ICD-10-CM | POA: Diagnosis not present

## 2016-02-04 DIAGNOSIS — E1142 Type 2 diabetes mellitus with diabetic polyneuropathy: Secondary | ICD-10-CM | POA: Diagnosis not present

## 2016-02-04 DIAGNOSIS — E785 Hyperlipidemia, unspecified: Secondary | ICD-10-CM | POA: Diagnosis not present

## 2016-02-04 DIAGNOSIS — H53149 Visual discomfort, unspecified: Secondary | ICD-10-CM

## 2016-02-04 DIAGNOSIS — E1169 Type 2 diabetes mellitus with other specified complication: Secondary | ICD-10-CM

## 2016-02-04 DIAGNOSIS — H5319 Other subjective visual disturbances: Secondary | ICD-10-CM

## 2016-02-04 LAB — POCT GLYCOSYLATED HEMOGLOBIN (HGB A1C): HEMOGLOBIN A1C: 8.2

## 2016-02-04 MED ORDER — INSULIN GLARGINE 100 UNIT/ML SOLOSTAR PEN: 10.0000 [IU] | PEN_INJECTOR | Freq: Every day | SUBCUTANEOUS | Status: DC

## 2016-02-04 MED ORDER — INSULIN PEN NEEDLE 31G X 5 MM MISC: 1.0000 | Freq: Every day | Status: DC

## 2016-02-04 NOTE — Progress Notes (Signed)
Subjective:    Patient ID: Richard Gordon, male    DOB: [birth year], 65 y.o.   MRN: [id number]  HPI: Richard Gordon is a 65 y.o. male presenting on 02/04/2016 for Diabetes   HPI   Here for diabetes follow up.  Diabetes - Last opthalmology appointment was 1 year ago. A1C 8.2 today, up from 6.7. Taking metformin and jenuvia as prescribed. Avg blood sugar is 130 per patient report. Checks twice per week, once in the morning and once in the evening. Left side tingling r/t CVA in the past. Slight tingling in BLE that is not especially bothersome. Monofilament exam with no decreased sensation. Walking 1 mile per day. Light sensitivity:  he has had photophobia for the past month, ever since he got a cold. Once the photophobia was so bad that it made him nauseated. He has had this issue in the past. Saw eye doctor for it in the past. It stopped for a while and has recently restarted.   Hypertryglycerides - TG >1100 on last lipid panel. Pt had just eaten an energy bar prior to draw. 24 hour diet recall: pancakes and oranges this morning and two hamburgers yesterday evening. No stomach pain presently or with food.  HTN - No CP/SOB or swelling. No lightheadedness, dizziness, headaches, or vision changes other than photophobia. Did get a little dizzy with standing up when he had the cold a month ago.    Past Medical History  Diagnosis Date  . Hypertension   . Hyperlipidemia   . Diabetes mellitus without complication ([place])   . Stroke [facility])     Current Outpatient Prescriptions on File Prior to Visit  Medication Sig  . aspirin 325 MG tablet Take 325 mg by mouth daily.  . hydrochlorothiazide (HYDRODIURIL) 25 MG tablet Take 25 mg by mouth daily.  Marland Kitchen lisinopril (PRINIVIL,ZESTRIL) 20 MG tablet TAKE 1 TABLET EVERY DAY  . metFORMIN (GLUCOPHAGE) 1000 MG tablet TAKE 1 TABLET TWICE DAILY  . Omega-3 Fatty Acids (FISH OIL) 1200 MG CAPS Take by mouth.  . simvastatin (ZOCOR) 20 MG tablet TAKE 1 TABLET NIGHTLY.    . sitaGLIPtin (JANUVIA) 100 MG tablet Take 1 tablet (100 mg total) by mouth daily.   No current facility-administered medications on file prior to visit.    Review of Systems  Constitutional: Negative for fever, chills, diaphoresis, activity change, appetite change and fatigue.  HENT: Negative for ear pain, hearing loss, rhinorrhea, sinus pressure, sore throat and tinnitus.   Eyes: Positive for photophobia. Negative for pain and visual disturbance.  Respiratory: Negative for cough, chest tightness and shortness of breath.   Cardiovascular: Negative for chest pain and leg swelling.  Gastrointestinal: Positive for nausea (once with photophobia). Negative for vomiting, abdominal pain, diarrhea, constipation and abdominal distention.  Endocrine: Negative for polydipsia, polyphagia and polyuria.  Genitourinary: Negative for dysuria, urgency, decreased urine volume and difficulty urinating.  Musculoskeletal: Negative for myalgias, back pain, joint swelling, arthralgias and gait problem.  Skin: Negative for color change.  Neurological: Positive for dizziness and numbness (numbness/tingling to LUE and LLE r/t old CVA). Negative for weakness, light-headedness and headaches.  Psychiatric/Behavioral: Negative for behavioral problems and agitation. The patient is not nervous/anxious.    Per HPI unless specifically indicated above     Objective:    BP 114/73 mmHg  Pulse 80  Temp(Src) 97.7 F (36.5 C) (Oral)  Resp 16  Ht 5' 10" (1.778 m)  Wt 179 lb (81.194 kg)  BMI 25.68 kg/m2  Wt  Readings from Last 3 Encounters:  02/04/16 179 lb (81.194 kg)  11/03/15 185 lb (83.915 kg)  07/09/15 183 lb (83.008 kg)    Physical Exam  Constitutional: He is oriented to person, place, and time. He appears well-developed and well-nourished. No distress.  HENT:  Head: Normocephalic and atraumatic.  Eyes: Conjunctivae are normal. Right eye exhibits no discharge. Left eye exhibits no discharge. No scleral  icterus.  Neck: Normal range of motion. Neck supple. Carotid bruit is not present. No thyromegaly present.  Cardiovascular: Normal rate, regular rhythm and normal heart sounds.  Exam reveals no gallop and no friction rub.   No murmur heard. Pulmonary/Chest: Effort normal and breath sounds normal. No respiratory distress. He has no wheezes.  Abdominal: Soft. Bowel sounds are normal. He exhibits no distension. There is no tenderness. There is no rebound and no guarding.  Musculoskeletal: Normal range of motion. He exhibits no edema or tenderness.  Neurological: He is alert and oriented to person, place, and time. He has normal reflexes.  Skin: Skin is warm and dry. No rash noted. No erythema.  Psychiatric: He has a normal mood and affect. His behavior is normal. Judgment and thought content normal.   Diabetic Foot Exam - Simple   Simple Foot Form  Diabetic Foot exam was performed with the following findings:  Yes 02/04/2016  2:36 PM  Visual Inspection  No deformities, no ulcerations, no other skin breakdown bilaterally:  Yes  Sensation Testing  Intact to touch and monofilament testing bilaterally:  Yes  Pulse Check  Posterior Tibialis and Dorsalis pulse intact bilaterally:  Yes  Comments      Results for orders placed or performed in visit on 02/04/16  POCT HgB A1C  Result Value Ref Range   Hemoglobin A1C 8.2       Assessment & Plan:   Problem List Items Addressed This Visit      Cardiovascular and Mediastinum   Hypertension    Controlled.         Nervous and Auditory   DM type 2 with diabetic peripheral neuropathy ([name])    Start insulin today. 10 units once daily. Pt instructed on how to inject. Will titrate 2 units for every FBG > 120.  Diet and lifestyle interventions reviewed.  Recheck 1 mos to see how sugars are doing. Hypoglycemia protocol reviewed. Refer to [facility] for eye exam.       Relevant Medications   Insulin Glargine (LANTUS) 100 UNIT/ML Solostar Pen    Insulin Pen Needle 31G X 5 MM MISC   Other Relevant Orders   POCT HgB A1C (Completed)   Ambulatory referral to Ophthalmology     Other   Hyperlipidemia associated with type 2 diabetes mellitus ([place])   Relevant Medications   Insulin Glargine (LANTUS) 100 UNIT/ML Solostar Pen   Other Relevant Orders   Lipid panel    Other Visit Diagnoses    Photophobia        Refer to opthamology for evaluation and management.     Relevant Orders    Ambulatory referral to Ophthalmology       Meds ordered this encounter  Medications  . Insulin Glargine (LANTUS) 100 UNIT/ML Solostar Pen    Sig: Inject 10 Units into the skin daily at 10 pm.    Dispense:  15 mL    Refill:  11    Order Specific Question:  Supervising Provider    Answer:  Arlis Porta [phone]  . Insulin Pen Needle  31G X 5 MM MISC    Sig: 1 each by Does not apply route daily.    Dispense:  100 each    Refill:  5    Order Specific Question:  Supervising Provider    Answer:  Arlis Porta [[id number]]      Follow up plan: Return in about 4 weeks (around 03/03/2016) for diabetes.

## 2016-02-04 NOTE — Assessment & Plan Note (Signed)
Start insulin today. 10 units once daily. Pt instructed on how to inject. Will titrate 2 units for every FBG > 120.  Diet and lifestyle interventions reviewed.  Recheck 1 mos to see how sugars are doing. Hypoglycemia protocol reviewed. Refer to [facility] for eye exam.

## 2016-02-04 NOTE — Assessment & Plan Note (Signed)
Controlled.

## 2016-02-04 NOTE — Patient Instructions (Signed)
We are starting insulin today. Start by giving yourself 10 units at night.  Add 2 units every 7 days for blood sugar > 120.  CHeck your sugar in the AM before you eat.   Please check your blood glucose one time daily. If your glucose is < 70 mg/dl or you have symptoms of hypoglycemia dizziness, headache, hunger and jitteriness please drink 4 oz of juice or soda.  Check blood glucose 15 minutes later. If it has not risen to >100, please seek medical attention. If > 100 please eat a snack containing protein such as peanut butter and crackers.  Let's have you see an eye doctor about your recent light sensitivity.

## 2016-02-05 ENCOUNTER — Telehealth: Payer: Self-pay | Admitting: Family Medicine

## 2016-02-05 ENCOUNTER — Other Ambulatory Visit: Payer: Self-pay | Admitting: *Deleted

## 2016-02-05 DIAGNOSIS — E785 Hyperlipidemia, unspecified: Secondary | ICD-10-CM | POA: Diagnosis not present

## 2016-02-05 DIAGNOSIS — E1169 Type 2 diabetes mellitus with other specified complication: Secondary | ICD-10-CM | POA: Diagnosis not present

## 2016-02-05 MED ORDER — GLUCOSE BLOOD VI STRP: ORAL_STRIP | Status: DC

## 2016-02-05 MED ORDER — ACCU-CHEK MULTICLIX LANCETS MISC: Status: DC

## 2016-02-05 NOTE — Telephone Encounter (Signed)
Pt needs supplies order for accucheck avida meter.  His call back number is [phone]

## 2016-02-06 LAB — LIPID PANEL
CHOL/HDL RATIO: 7.3 ratio — AB (ref 0.0–5.0)
Cholesterol, Total: 109 mg/dL (ref 100–199)
HDL: 15 mg/dL — AB (ref >39)
LDL CALC: 17 mg/dL (ref 0–99)
TRIGLYCERIDES: 385 mg/dL — AB (ref 0–149)
VLDL CHOLESTEROL CAL: 77 mg/dL — AB (ref 5–40)

## 2016-02-10 ENCOUNTER — Encounter: Payer: Self-pay | Admitting: *Deleted

## 2016-02-14 ENCOUNTER — Emergency Department
Admission: EM | Admit: 2016-02-14 | Discharge: 2016-02-14 | Disposition: A | Discharge status: Home / Self Care | Payer: Commercial Managed Care - HMO | Attending: Emergency Medicine | Admitting: Emergency Medicine

## 2016-02-14 DIAGNOSIS — N183 Chronic kidney disease, stage 3 (moderate): Secondary | ICD-10-CM | POA: Insufficient documentation

## 2016-02-14 DIAGNOSIS — E1142 Type 2 diabetes mellitus with diabetic polyneuropathy: Secondary | ICD-10-CM | POA: Diagnosis not present

## 2016-02-14 DIAGNOSIS — Z7984 Long term (current) use of oral hypoglycemic drugs: Secondary | ICD-10-CM | POA: Diagnosis not present

## 2016-02-14 DIAGNOSIS — Z794 Long term (current) use of insulin: Secondary | ICD-10-CM | POA: Insufficient documentation

## 2016-02-14 DIAGNOSIS — E1165 Type 2 diabetes mellitus with hyperglycemia: Secondary | ICD-10-CM | POA: Insufficient documentation

## 2016-02-14 DIAGNOSIS — I129 Hypertensive chronic kidney disease with stage 1 through stage 4 chronic kidney disease, or unspecified chronic kidney disease: Secondary | ICD-10-CM | POA: Diagnosis not present

## 2016-02-14 DIAGNOSIS — R739 Hyperglycemia, unspecified: Secondary | ICD-10-CM

## 2016-02-14 LAB — URINALYSIS COMPLETE WITH MICROSCOPIC (ARMC ONLY)
BACTERIA UA: NONE SEEN
Bilirubin Urine: NEGATIVE
Glucose, UA: >500 mg/dL — AB
Hgb urine dipstick: NEGATIVE
Ketones, ur: NEGATIVE mg/dL
Leukocytes, UA: NEGATIVE
Nitrite: NEGATIVE
PROTEIN: NEGATIVE mg/dL
Specific Gravity, Urine: 1.022 (ref 1.005–1.030)
pH: 5 (ref 5.0–8.0)

## 2016-02-14 LAB — BASIC METABOLIC PANEL
Anion gap: 8 (ref 5–15)
BUN: 14 mg/dL (ref 6–20)
CHLORIDE: 106 mmol/L (ref 101–111)
CO2: 21 mmol/L — ABNORMAL LOW (ref 22–32)
Calcium: 8.6 mg/dL — ABNORMAL LOW (ref 8.9–10.3)
Creatinine, Ser: 1.3 mg/dL — ABNORMAL HIGH (ref 0.61–1.24)
GFR calc Af Amer: >60 mL/min (ref >60)
GFR calc non Af Amer: 56 mL/min — ABNORMAL LOW (ref >60)
GLUCOSE: 364 mg/dL — AB (ref 65–99)
POTASSIUM: 4.3 mmol/L (ref 3.5–5.1)
Sodium: 135 mmol/L (ref 135–145)

## 2016-02-14 LAB — CBC
HEMATOCRIT: 37 % — AB (ref 40.0–52.0)
HEMOGLOBIN: 12.9 g/dL — AB (ref 13.0–18.0)
MCH: 33.1 pg (ref 26.0–34.0)
MCHC: 34.9 g/dL (ref 32.0–36.0)
MCV: 95 fL (ref 80.0–100.0)
Platelets: 177 10*3/uL (ref 150–440)
RBC: 3.9 MIL/uL — ABNORMAL LOW (ref 4.40–5.90)
RDW: 13 % (ref 11.5–14.5)
WBC: 4.4 10*3/uL (ref 3.8–10.6)

## 2016-02-14 LAB — GLUCOSE, CAPILLARY
GLUCOSE-CAPILLARY: 306 mg/dL — AB (ref 65–99)
GLUCOSE-CAPILLARY: 137 mg/dL — AB (ref 65–99)

## 2016-02-14 MED ORDER — INSULIN ASPART 100 UNIT/ML ~~LOC~~ SOLN
10.0000 [IU] | Freq: Once | SUBCUTANEOUS | Status: AC
  Administered 2016-02-14: 10 [IU] via SUBCUTANEOUS
  Filled 2016-02-14: qty 10

## 2016-02-14 NOTE — ED Provider Notes (Addendum)
[facility] Emergency Department Provider Note     Time seen: ----------------------------------------- 6:33 PM on 02/14/2016 -----------------------------------------    I have reviewed the triage vital signs and the nursing notes.   HISTORY  Chief Complaint Hyperglycemia    HPI Richard Gordon is a 65 y.o. male who presents ER for hypoglycemia. Patient states on Friday he started taking Lantus and historically has only been taking pills for glycemic control. Since that time his blood sugar has been elevated. He denies any other complaints, states his diet has not been a strict diabetic diet. Denies any recent illness. Patient denies weakness at this time.   Past Medical History  Diagnosis Date  . Hypertension   . Hyperlipidemia   . Diabetes mellitus without complication ([place])   . Stroke [facility])     Patient Active Problem List   Diagnosis Date Noted  . AD (atopic dermatitis) 11/03/2015  . Chronic kidney disease (CKD), stage III (moderate) 11/03/2015  . Fatigue 11/03/2015  . Personal history of stroke with residual effects 11/03/2015  . Hyperlipidemia associated with type 2 diabetes mellitus ([place]) 07/09/2015  . Hypertension 07/09/2015  . DM type 2 with diabetic peripheral neuropathy ([place]) 05/22/2015    Past Surgical History  Procedure Laterality Date  . Cataract extraction    . Tonsillectomy      Allergies Review of patient's allergies indicates no known allergies.  Social History Social History  Substance Use Topics  . Smoking status: Never Smoker   . Smokeless tobacco: Never Used  . Alcohol Use: No    Review of Systems Constitutional: Negative for fever. Eyes: Negative for visual changes. ENT: Negative for sore throat. Cardiovascular: Negative for chest pain. Respiratory: Negative for shortness of breath. Gastrointestinal: Negative for abdominal pain, vomiting and diarrhea. Genitourinary: Negative for dysuria. Musculoskeletal:  Negative for back pain. Skin: Negative for rash. Neurological: Negative for headaches, focal weakness or numbness.  10-point ROS otherwise negative.  ____________________________________________   PHYSICAL EXAM:  VITAL SIGNS: ED Triage Vitals  Enc Vitals Group     BP 02/14/16 1512 144/60 mmHg     Pulse Rate 02/14/16 1512 82     Resp 02/14/16 1512 18     Temp 02/14/16 1512 98 F (36.7 C)     Temp Source 02/14/16 1512 Oral     SpO2 02/14/16 1512 97 %     Weight 02/14/16 1512 180 lb (81.647 kg)     Height 02/14/16 1512 5' 10" (1.778 m)     Head Cir --      Peak Flow --      Pain Score --      Pain Loc --      Pain Edu? --      Excl. in [place]? --     Constitutional: Alert and oriented. Well appearing and in no distress. Eyes: Conjunctivae are normal. PERRL. Normal extraocular movements. ENT   Head: Normocephalic and atraumatic.   Nose: No congestion/rhinnorhea.   Mouth/Throat: Mucous membranes are moist.   Neck: No stridor. Cardiovascular: Normal rate, regular rhythm. Normal and symmetric distal pulses are present in all extremities. No murmurs, rubs, or gallops. Respiratory: Normal respiratory effort without tachypnea nor retractions. Breath sounds are clear and equal bilaterally. No wheezes/rales/rhonchi. Gastrointestinal: Soft and nontender. No distention. No abdominal bruits.  Musculoskeletal: Nontender with normal range of motion in all extremities. No joint effusions.  No lower extremity tenderness nor edema. Neurologic:  Normal speech and language. No gross focal neurologic deficits are appreciated.  Speech is normal. No gait instability. Skin:  Skin is warm, dry and intact. No rash noted. Psychiatric: Mood and affect are normal. Speech and behavior are normal. Patient exhibits appropriate insight and judgment. ____________________________________________  ED COURSE:  Pertinent labs & imaging results that were available during my care of the patient were  reviewed by me and considered in my medical decision making (see chart for details). Patient is in no acute distress, will check basic labs and reevaluate. ____________________________________________    LABS (pertinent positives/negatives)  Labs Reviewed  BASIC METABOLIC PANEL - Abnormal; Notable for the following:    CO2 21 (*)    Glucose, Bld 364 (*)    Creatinine, Ser 1.30 (*)    Calcium 8.6 (*)    GFR calc non Af Amer 56 (*)    All other components within normal limits  CBC - Abnormal; Notable for the following:    RBC 3.90 (*)    Hemoglobin 12.9 (*)    HCT 37.0 (*)    All other components within normal limits  URINALYSIS COMPLETEWITH MICROSCOPIC ([facility] ONLY) - Abnormal; Notable for the following:    Color, Urine YELLOW (*)    APPearance CLEAR (*)    Glucose, UA >500 (*)    Squamous Epithelial / LPF 0-5 (*)    All other components within normal limits  GLUCOSE, CAPILLARY - Abnormal; Notable for the following:    Glucose-Capillary 306 (*)    All other components within normal limits  CBG MONITORING, ED   ____________________________________________  FINAL ASSESSMENT AND PLAN  Hyperglycemia  Plan: Patient with labs as dictated above. Patient with recent hyperglycemia, I will increase his Lantus to 12 units at night, advised strict diabetic diet and follow-up with his doctor in the next week. He did receive one-time dose of subcutaneous insulin 10 units here.   Earleen Newport, MD   Earleen Newport, MD 02/14/16 Kathaleen Maser  Earleen Newport, MD 02/14/16 [phone]

## 2016-02-14 NOTE — Discharge Instructions (Signed)

## 2016-02-14 NOTE — ED Notes (Signed)
PT arrives to ER via POV c/o hyperglycemia, CBG 300's at home. PT states that he was switched from PO pills to insulin on Friday. Denies any other complaints. Pt alert and oriented X4, active, cooperative, pt in NAD. RR even and unlabored, color WNL.

## 2016-02-14 NOTE — ED Notes (Signed)
NAD noted at time of D/C. Pt denies questions or concerns. Pt ambulatory to the lobby at this time.

## 2016-02-15 ENCOUNTER — Ambulatory Visit (INDEPENDENT_AMBULATORY_CARE_PROVIDER_SITE_OTHER): Payer: Commercial Managed Care - HMO | Admitting: *Deleted

## 2016-02-15 ENCOUNTER — Encounter: Payer: Self-pay | Admitting: *Deleted

## 2016-02-15 ENCOUNTER — Other Ambulatory Visit: Payer: Self-pay | Admitting: Family Medicine

## 2016-02-15 VITALS — BP 128/76 | HR 77 | Temp 98.3°F | Resp 16 | Wt 176.4 lb

## 2016-02-15 DIAGNOSIS — E1142 Type 2 diabetes mellitus with diabetic polyneuropathy: Secondary | ICD-10-CM | POA: Diagnosis not present

## 2016-02-15 LAB — GLUCOSE, POCT (MANUAL RESULT ENTRY)

## 2016-02-15 MED ORDER — BLOOD GLUCOSE MONITOR KIT: PACK | Status: DC

## 2016-02-16 ENCOUNTER — Other Ambulatory Visit: Payer: Self-pay | Admitting: *Deleted

## 2016-02-16 DIAGNOSIS — E1142 Type 2 diabetes mellitus with diabetic polyneuropathy: Secondary | ICD-10-CM

## 2016-02-16 MED ORDER — ACCU-CHEK MULTICLIX LANCETS MISC: 1.0000 | Freq: Two times a day (BID) | Status: DC

## 2016-02-16 MED ORDER — ACCU-CHEK AVIVA PLUS W/DEVICE KIT: 1.0000 | PACK | Freq: Two times a day (BID) | Status: DC

## 2016-02-16 MED ORDER — GLUCOSE BLOOD VI STRP
1.0000 | ORAL_STRIP | Freq: Two times a day (BID) | Status: DC
Start: 2016-02-16 — End: 2016-04-04

## 2016-02-17 ENCOUNTER — Ambulatory Visit: Payer: Self-pay | Admitting: Family Medicine

## 2016-02-29 ENCOUNTER — Other Ambulatory Visit: Payer: Self-pay | Admitting: Family Medicine

## 2016-03-03 ENCOUNTER — Encounter: Payer: Self-pay | Admitting: Family Medicine

## 2016-03-03 ENCOUNTER — Ambulatory Visit (INDEPENDENT_AMBULATORY_CARE_PROVIDER_SITE_OTHER): Payer: Commercial Managed Care - HMO | Admitting: Family Medicine

## 2016-03-03 VITALS — BP 130/73 | HR 86 | Temp 97.7°F | Resp 16 | Ht 70.0 in | Wt 179.0 lb

## 2016-03-03 DIAGNOSIS — R350 Frequency of micturition: Secondary | ICD-10-CM

## 2016-03-03 DIAGNOSIS — E1142 Type 2 diabetes mellitus with diabetic polyneuropathy: Secondary | ICD-10-CM

## 2016-03-03 LAB — POCT URINALYSIS DIPSTICK
BILIRUBIN UA: NEGATIVE
Blood, UA: NEGATIVE
GLUCOSE UA: NEGATIVE
Ketones, UA: NEGATIVE
LEUKOCYTES UA: NEGATIVE
Nitrite, UA: NEGATIVE
Protein, UA: NEGATIVE
Spec Grav, UA: 1.015
Urobilinogen, UA: 0.2
pH, UA: 5

## 2016-03-03 LAB — POCT UA - MICROALBUMIN
ALBUMIN/CREATININE RATIO, URINE, POC: 0
CREATININE, POC: 0 mg/dL
Microalbumin Ur, POC: 20 mg/L

## 2016-03-03 NOTE — Assessment & Plan Note (Signed)
Increase Lantus to 16 units daily. Pt seems nervous and unsure about insulin- would appreciate diabetes refresher course. Referral placed today. 1/2 glimipiride to prevent hypoglycemia.  Plan for patient to increase by 2 units every 7 days at his comfort level.  UA microalbumin done today. Eye exam is scheduled. Foot exam UTD. Return in 2 mos for A1c recheck.

## 2016-03-03 NOTE — Progress Notes (Signed)
Subjective:    Patient ID: Richard Gordon, male    DOB: 09/07/1951, 65 y.o.   MRN: [id number]  HPI: Richard Gordon is a 65 y.o. male presenting on 03/03/2016 for Diabetes   HPI   Has been checking sugars twice per day, fasting and at 3 pm. He took 14 units of Lantus yesterday and had been taking 12 units since he wend to the ER with elevated blood sugars. Fasting sugars have been mostly 150-250 range. 3 pm sugars have been between 250 and 300. This is two hours after his lunch. He is still taking metformin and glimepiride.   Feels like he is urinating more. No extreme thirst. No extreme hunger. No hypoglycemic symptoms. No vision changes. Has numbness in left arm and left leg when it is cold outside. Has experienced tingling in right arm once or twice, but it away. Void 2 mores times per day. Waking up at 5am to void. Drinking water to be healthy. No changes in urine stream. Present x 2 weeks.   Diet seems to be doing okay. He stays away from sodas, pastas, potatoes, rice, or breads.   Past Medical History  Diagnosis Date  . Hypertension   . Hyperlipidemia   . Diabetes mellitus without complication ([place])   . Stroke [facility])     Current Outpatient Prescriptions on File Prior to Visit  Medication Sig  . aspirin 325 MG tablet Take 325 mg by mouth daily.  . blood glucose meter kit and supplies KIT Dispense based on patient and insurance preference. Use up to four times daily as directed. (FOR ICD-9 250.00, 250.01).  . Blood Glucose Monitoring Suppl (ACCU-CHEK AVIVA PLUS) w/Device KIT 1 each by Does not apply route 2 (two) times daily. Dx: E11.42  . gabapentin (NEURONTIN) 300 MG capsule Take 300 mg by mouth daily.  Marland Kitchen glimepiride (AMARYL) 4 MG tablet Take 4 mg by mouth 2 (two) times daily.  Marland Kitchen glucose blood (ACCU-CHEK AVIVA PLUS) test strip 1 each by Other route 2 (two) times daily. Dx: E11.42  . glucose blood (ACCU-CHEK AVIVA) test strip Check blood sugar twice daily. DX: E11.42  .  hydrochlorothiazide (HYDRODIURIL) 25 MG tablet Take 25 mg by mouth daily.  . Insulin Glargine (LANTUS) 100 UNIT/ML Solostar Pen Inject 10 Units into the skin daily at 10 pm.  . Insulin Pen Needle 31G X 5 MM MISC 1 each by Does not apply route daily.  . Lancets (ACCU-CHEK MULTICLIX) lancets Use with Accu-Chek meter to check blood sugar twice daily. DX:E11.42  . Lancets (ACCU-CHEK MULTICLIX) lancets 1 each by Other route 2 (two) times daily. Dx: E11.42  . lisinopril (PRINIVIL,ZESTRIL) 20 MG tablet TAKE 1 TABLET EVERY DAY  . metFORMIN (GLUCOPHAGE) 1000 MG tablet TAKE 1 TABLET TWICE DAILY  . Omega-3 Fatty Acids (FISH OIL) 1200 MG CAPS Take by mouth.  . simvastatin (ZOCOR) 20 MG tablet TAKE 1 TABLET NIGHTLY.   No current facility-administered medications on file prior to visit.    Review of Systems  Constitutional: Negative for activity change and appetite change.  HENT: Negative for congestion, hearing loss and trouble swallowing.   Eyes: Negative for visual disturbance.  Respiratory: Negative for chest tightness and shortness of breath.   Cardiovascular: Negative for chest pain.  Endocrine: Negative for polydipsia, polyphagia and polyuria.  Genitourinary: Positive for frequency. Negative for dysuria, urgency, hematuria, flank pain and difficulty urinating.  Musculoskeletal: Negative for myalgias, back pain and arthralgias.  Allergic/Immunologic: Negative for environmental allergies.  Neurological:  Positive for numbness (On left upper and left lower extremities when it is cold outside).  Psychiatric/Behavioral: Negative for sleep disturbance.   Per HPI unless specifically indicated above     Objective:    BP 130/73 mmHg  Pulse 86  Temp(Src) 97.7 F (36.5 C) (Oral)  Resp 16  Ht 5' 10" (1.778 m)  Wt 179 lb (81.194 kg)  BMI 25.68 kg/m2  Wt Readings from Last 3 Encounters:  03/03/16 179 lb (81.194 kg)  02/15/16 176 lb 6.4 oz (80.015 kg)  02/14/16 180 lb (81.647 kg)    Physical  Exam  Constitutional: He is oriented to person, place, and time. He appears well-developed.  HENT:  Head: Normocephalic.  Neck: Normal range of motion.  Cardiovascular: Normal rate, regular rhythm and normal heart sounds.   Pulmonary/Chest: Effort normal and breath sounds normal. No respiratory distress.  Abdominal: Soft. There is no CVA tenderness.  Musculoskeletal: Normal range of motion.  Neurological: He is alert and oriented to person, place, and time.  Skin: Skin is warm and dry.  Psychiatric: His behavior is normal. His mood appears anxious.   Results for orders placed or performed in visit on 03/03/16  POCT urinalysis dipstick  Result Value Ref Range   Color, UA yellow    Clarity, UA clear    Glucose, UA negative    Bilirubin, UA negative    Ketones, UA negative    Spec Grav, UA 1.015    Blood, UA negative    pH, UA 5.0    Protein, UA negative    Urobilinogen, UA 0.2    Nitrite, UA negative    Leukocytes, UA Negative Negative  POCT UA - Microalbumin  Result Value Ref Range   Microalbumin Ur, POC 20 mg/L   Creatinine, POC 0 mg/dL   Albumin/Creatinine Ratio, Urine, POC 0       Assessment & Plan:   Problem List Items Addressed This Visit      Nervous and Auditory   DM type 2 with diabetic peripheral neuropathy ([name]) - Primary    Increase Lantus to 16 units daily. Pt seems nervous and unsure about insulin- would appreciate diabetes refresher course. Referral placed today. 1/2 glimipiride to prevent hypoglycemia.  Plan for patient to increase by 2 units every 7 days at his comfort level.  UA microalbumin done today. Eye exam is scheduled. Foot exam UTD. Return in 2 mos for A1c recheck.       Relevant Orders   POCT UA - Microalbumin (Completed)   Ambulatory referral to diabetic education    Other Visit Diagnoses    Urinary frequency        Likely 2/2 water intake. Negative UA. Pt will return if symptoms worsen or do not improve.     Relevant Orders    POCT  urinalysis dipstick (Completed)       No orders of the defined types were placed in this encounter.      Follow up plan: Return in about 2 months (around 05/07/2016) for DM.

## 2016-03-03 NOTE — Patient Instructions (Addendum)
Diabetes: Increase lantus to 16 units each day. We will have you take a refresher course of the diabetes education now that you are on insulin.

## 2016-03-08 DIAGNOSIS — H401112 Primary open-angle glaucoma, right eye, moderate stage: Secondary | ICD-10-CM | POA: Diagnosis not present

## 2016-03-08 LAB — HM DIABETES EYE EXAM

## 2016-03-17 ENCOUNTER — Ambulatory Visit: Payer: Self-pay | Admitting: *Deleted

## 2016-03-17 ENCOUNTER — Encounter: Payer: Self-pay | Admitting: Family Medicine

## 2016-04-04 ENCOUNTER — Telehealth: Payer: Self-pay | Admitting: Family Medicine

## 2016-04-04 NOTE — Telephone Encounter (Signed)
Pt needs a refill on Accuchek test strips.

## 2016-04-04 NOTE — Telephone Encounter (Signed)
Patient has 6 refills on test strips.

## 2016-05-04 ENCOUNTER — Other Ambulatory Visit: Payer: Self-pay | Admitting: Family Medicine

## 2016-05-09 ENCOUNTER — Encounter (INDEPENDENT_AMBULATORY_CARE_PROVIDER_SITE_OTHER): Payer: Self-pay

## 2016-05-09 ENCOUNTER — Ambulatory Visit (INDEPENDENT_AMBULATORY_CARE_PROVIDER_SITE_OTHER): Payer: Commercial Managed Care - HMO | Admitting: Family Medicine

## 2016-05-09 VITALS — BP 130/79 | HR 71 | Temp 97.7°F | Resp 16 | Ht 70.0 in | Wt 181.0 lb

## 2016-05-09 DIAGNOSIS — I6930 Unspecified sequelae of cerebral infarction: Secondary | ICD-10-CM

## 2016-05-09 DIAGNOSIS — E1169 Type 2 diabetes mellitus with other specified complication: Secondary | ICD-10-CM | POA: Diagnosis not present

## 2016-05-09 DIAGNOSIS — E785 Hyperlipidemia, unspecified: Secondary | ICD-10-CM

## 2016-05-09 DIAGNOSIS — E1142 Type 2 diabetes mellitus with diabetic polyneuropathy: Secondary | ICD-10-CM | POA: Diagnosis not present

## 2016-05-09 DIAGNOSIS — N183 Chronic kidney disease, stage 3 unspecified: Secondary | ICD-10-CM

## 2016-05-09 LAB — POCT GLYCOSYLATED HEMOGLOBIN (HGB A1C): Hemoglobin A1C: 6.5

## 2016-05-09 MED ORDER — SIMVASTATIN 20 MG PO TABS: 10.0000 mg | ORAL_TABLET | Freq: Every day | ORAL | Status: DC

## 2016-05-09 MED ORDER — GLIMEPIRIDE 4 MG PO TABS: 4.0000 mg | ORAL_TABLET | Freq: Every day | ORAL | Status: DC

## 2016-05-09 MED ORDER — INSULIN PEN NEEDLE 31G X 5 MM MISC: 1.0000 | Freq: Every day | Status: DC

## 2016-05-09 NOTE — Progress Notes (Signed)
Subjective:    Patient ID: Richard Gordon, male    DOB: 02-22-1951, 65 y.o.   MRN: [id number]  HPI: Richard Gordon is a 65 y.o. male presenting on 05/09/2016 for Diabetes   HPI  Pt presents for diabetes follow-up. A1c is down to 6.5%- is taking 30 units of lantus daily. AM sugars are 109 avg in the AM. No hypoglycemia. Occasional foot numbness. Gabapentin made him feel dizzy. Having a hard time keeping to diet.  Just had eye exam- no retinopathy.   Past Medical History  Diagnosis Date  . Hypertension   . Hyperlipidemia   . Diabetes mellitus without complication ([place])   . Stroke [facility])     Current Outpatient Prescriptions on File Prior to Visit  Medication Sig  . aspirin 325 MG tablet Take 325 mg by mouth daily.  . blood glucose meter kit and supplies KIT Dispense based on patient and insurance preference. Use up to four times daily as directed. (FOR ICD-9 250.00, 250.01).  . Blood Glucose Monitoring Suppl (ACCU-CHEK AVIVA PLUS) w/Device KIT 1 each by Does not apply route 2 (two) times daily. Dx: E11.42  . glucose blood (ACCU-CHEK AVIVA) test strip Check blood sugar twice daily. DX: E11.42  . hydrochlorothiazide (HYDRODIURIL) 25 MG tablet TAKE 1 TABLET EVERY DAY  . Insulin Glargine (LANTUS) 100 UNIT/ML Solostar Pen Inject 10 Units into the skin daily at 10 pm.  . Lancets (ACCU-CHEK MULTICLIX) lancets Use with Accu-Chek meter to check blood sugar twice daily. DX:E11.42  . Lancets (ACCU-CHEK MULTICLIX) lancets 1 each by Other route 2 (two) times daily. Dx: E11.42  . lisinopril (PRINIVIL,ZESTRIL) 20 MG tablet TAKE 1 TABLET EVERY DAY  . metFORMIN (GLUCOPHAGE) 1000 MG tablet TAKE 1 TABLET TWICE DAILY  . Omega-3 Fatty Acids (FISH OIL) 1200 MG CAPS Take by mouth.   No current facility-administered medications on file prior to visit.    Review of Systems  Constitutional: Negative for fever and chills.  HENT: Negative.   Respiratory: Negative for chest tightness, shortness of breath and  wheezing.   Cardiovascular: Negative for chest pain, palpitations and leg swelling.  Gastrointestinal: Negative for nausea, vomiting and abdominal pain.  Endocrine: Negative.   Genitourinary: Negative for dysuria, urgency, discharge, penile pain and testicular pain.  Musculoskeletal: Negative for back pain, joint swelling and arthralgias.  Skin: Negative.   Neurological: Negative for dizziness, weakness, numbness and headaches.  Psychiatric/Behavioral: Negative for sleep disturbance and dysphoric mood.   Per HPI unless specifically indicated above     Objective:    BP 130/79 mmHg  Pulse 71  Temp(Src) 97.7 F (36.5 C) (Oral)  Resp 16  Ht 5' 10" (1.778 m)  Wt 181 lb (82.101 kg)  BMI 25.97 kg/m2  Wt Readings from Last 3 Encounters:  05/09/16 181 lb (82.101 kg)  03/03/16 179 lb (81.194 kg)  02/15/16 176 lb 6.4 oz (80.015 kg)    Physical Exam  Constitutional: He is oriented to person, place, and time. He appears well-developed and well-nourished. No distress.  HENT:  Head: Normocephalic and atraumatic.  Neck: Neck supple. No thyromegaly present.  Cardiovascular: Normal rate, regular rhythm and normal heart sounds.  Exam reveals no gallop and no friction rub.   No murmur heard. Pulmonary/Chest: Effort normal and breath sounds normal. He has no wheezes.  Abdominal: Soft. Bowel sounds are normal. He exhibits no distension. There is no tenderness. There is no rebound.  Musculoskeletal: Normal range of motion. He exhibits no edema or tenderness.  Neurological: He is alert and oriented to person, place, and time. He has normal reflexes.  Skin: Skin is warm and dry. No rash noted. No erythema.  Psychiatric: He has a normal mood and affect. His behavior is normal. Thought content normal.   Results for orders placed or performed in visit on 05/09/16  POCT HgB A1C  Result Value Ref Range   Hemoglobin A1C 6.5       Assessment & Plan:   Problem List Items Addressed This Visit       Nervous and Auditory   DM type 2 with diabetic peripheral neuropathy ([place]) - Primary    Great response of A1c to insulin. Reviewed hypoglycemia protocol. Pt will no longer self titrate insulin. Hold at 77. Will call if AM sugars < 100 consistent.  Consider reducing insulin dosing.  Eye exam UTD. Foot exam UTD. Recheck 3 mos.       Relevant Medications   glimepiride (AMARYL) 4 MG tablet   Insulin Pen Needle 31G X 5 MM MISC   simvastatin (ZOCOR) 20 MG tablet   Other Relevant Orders   POCT HgB A1C (Completed)   BASIC METABOLIC PANEL WITH GFR     Genitourinary   Chronic kidney disease (CKD), stage III (moderate)    Recheck kidney function today.         Other   Hyperlipidemia associated with type 2 diabetes mellitus ([place])    LDL of 17 last visit with elevated Tg's. Taking 1/2 dose simvastatin. My d/c if LDL <25.       Relevant Medications   glimepiride (AMARYL) 4 MG tablet   simvastatin (ZOCOR) 20 MG tablet   Other Relevant Orders   Lipid Profile   Personal history of stroke with residual effects    Overall doing well. Occasional numbness. Aspirin and statin as secondary prevention.          Meds ordered this encounter  Medications  . glimepiride (AMARYL) 4 MG tablet    Sig: Take 1 tablet (4 mg total) by mouth daily with breakfast.    Dispense:  90 tablet    Refill:  3    Order Specific Question:  Supervising Provider    Answer:  Arlis Porta [phone]  . Insulin Pen Needle 31G X 5 MM MISC    Sig: 1 each by Does not apply route daily.    Dispense:  100 each    Refill:  5    Order Specific Question:  Supervising Provider    Answer:  Arlis Porta [id number]  . simvastatin (ZOCOR) 20 MG tablet    Sig: Take 0.5 tablets (10 mg total) by mouth daily at 6 PM.    Dispense:  90 tablet    Refill:  3    Order Specific Question:  Supervising Provider    Answer:  Arlis Porta [[id number]]      Follow up plan: Return in about 3 months (around 08/09/2016) for  diabetes. Marland Kitchen

## 2016-05-09 NOTE — Assessment & Plan Note (Signed)
Recheck kidney function today.

## 2016-05-09 NOTE — Patient Instructions (Addendum)
Please check your blood glucose 2 times times daily. If your glucose is < 70 mg/dl or you have symptoms of hypoglycemia dizziness, headache, hunger, jitteriness and sweating please drink 4 oz of juice or soda.  Check blood glucose 15 minutes later. If it has not risen to >100, please seek medical attention. If > 100 please eat a snack containing protein such as peanut butter and crackers.  If your AM (fasting sugars are < 100 consistently, please give me a call. Do not go above 30 units of insulin daily.

## 2016-05-09 NOTE — Assessment & Plan Note (Signed)
Overall doing well. Occasional numbness. Aspirin and statin as secondary prevention.

## 2016-05-09 NOTE — Assessment & Plan Note (Signed)
LDL of 17 last visit with elevated Tg's. Taking 1/2 dose simvastatin. My d/c if LDL <25.

## 2016-05-09 NOTE — Assessment & Plan Note (Signed)
Great response of A1c to insulin. Reviewed hypoglycemia protocol. Pt will no longer self titrate insulin. Hold at 36. Will call if AM sugars < 100 consistent.  Consider reducing insulin dosing.  Eye exam UTD. Foot exam UTD. Recheck 3 mos.

## 2016-05-10 ENCOUNTER — Other Ambulatory Visit: Payer: Commercial Managed Care - HMO

## 2016-05-10 LAB — BASIC METABOLIC PANEL WITH GFR
BUN: 18 mg/dL (ref 7–25)
CO2: 22 mmol/L (ref 20–31)
Calcium: 9.1 mg/dL (ref 8.6–10.3)
Chloride: 105 mmol/L (ref 98–110)
Creat: 1.29 mg/dL — ABNORMAL HIGH (ref 0.70–1.25)
GFR, EST AFRICAN AMERICAN: 66 mL/min (ref >=60)
GFR, EST NON AFRICAN AMERICAN: 57 mL/min — AB (ref >=60)
GLUCOSE: 125 mg/dL — AB (ref 65–99)
POTASSIUM: 4.4 mmol/L (ref 3.5–5.3)
Sodium: 136 mmol/L (ref 135–146)

## 2016-05-10 LAB — LIPID PANEL
CHOLESTEROL: 96 mg/dL — AB (ref 125–200)
HDL: 21 mg/dL — ABNORMAL LOW (ref >=40)
LDL CALC: 30 mg/dL (ref <130)
Total CHOL/HDL Ratio: 4.6 Ratio (ref <=5.0)
Triglycerides: 226 mg/dL — ABNORMAL HIGH (ref <150)
VLDL: 45 mg/dL — AB (ref <30)

## 2016-06-14 ENCOUNTER — Telehealth: Payer: Self-pay | Admitting: Family Medicine

## 2016-06-14 NOTE — Telephone Encounter (Signed)
Pt needs another shipment of insulin ordered for [employer].  His call back number is [phone].  He left paperwork with phone and fax numbers

## 2016-06-14 NOTE — Telephone Encounter (Signed)
Okay- please leave the paperwork on my desk and we will determine what needs to be done.

## 2016-06-15 NOTE — Telephone Encounter (Signed)
Reorder form will be faxed to office.[place]

## 2016-07-05 ENCOUNTER — Other Ambulatory Visit: Payer: Self-pay

## 2016-08-15 ENCOUNTER — Encounter: Payer: Self-pay | Admitting: Family Medicine

## 2016-08-15 ENCOUNTER — Other Ambulatory Visit: Payer: Self-pay | Admitting: Pharmacist

## 2016-08-15 ENCOUNTER — Ambulatory Visit (INDEPENDENT_AMBULATORY_CARE_PROVIDER_SITE_OTHER): Payer: Commercial Managed Care - HMO | Admitting: Family Medicine

## 2016-08-15 VITALS — BP 126/60 | HR 74 | Temp 97.7°F | Ht 70.0 in | Wt 184.2 lb

## 2016-08-15 DIAGNOSIS — I10 Essential (primary) hypertension: Secondary | ICD-10-CM

## 2016-08-15 DIAGNOSIS — E1142 Type 2 diabetes mellitus with diabetic polyneuropathy: Secondary | ICD-10-CM | POA: Diagnosis not present

## 2016-08-15 DIAGNOSIS — E785 Hyperlipidemia, unspecified: Secondary | ICD-10-CM

## 2016-08-15 DIAGNOSIS — I6930 Unspecified sequelae of cerebral infarction: Secondary | ICD-10-CM | POA: Diagnosis not present

## 2016-08-15 DIAGNOSIS — N183 Chronic kidney disease, stage 3 unspecified: Secondary | ICD-10-CM

## 2016-08-15 DIAGNOSIS — Z23 Encounter for immunization: Secondary | ICD-10-CM | POA: Diagnosis not present

## 2016-08-15 DIAGNOSIS — Z1211 Encounter for screening for malignant neoplasm of colon: Secondary | ICD-10-CM

## 2016-08-15 DIAGNOSIS — E1169 Type 2 diabetes mellitus with other specified complication: Secondary | ICD-10-CM

## 2016-08-15 LAB — BASIC METABOLIC PANEL WITH GFR
BUN: 14 mg/dL (ref 7–25)
CALCIUM: 9.6 mg/dL (ref 8.6–10.3)
CO2: 26 mmol/L (ref 20–31)
Chloride: 104 mmol/L (ref 98–110)
Creat: 1.19 mg/dL (ref 0.70–1.25)
GFR, EST AFRICAN AMERICAN: 73 mL/min (ref >=60)
GFR, Est Non African American: 63 mL/min (ref >=60)
GLUCOSE: 99 mg/dL (ref 65–99)
Potassium: 4.3 mmol/L (ref 3.5–5.3)
Sodium: 139 mmol/L (ref 135–146)

## 2016-08-15 LAB — LIPID PANEL
CHOLESTEROL: 109 mg/dL — AB (ref 125–200)
HDL: 19 mg/dL — AB (ref >=40)
LDL Cholesterol: 40 mg/dL (ref <130)
TRIGLYCERIDES: 251 mg/dL — AB (ref <150)
Total CHOL/HDL Ratio: 5.7 Ratio — ABNORMAL HIGH (ref <=5.0)
VLDL: 50 mg/dL — ABNORMAL HIGH (ref <30)

## 2016-08-15 LAB — POCT GLYCOSYLATED HEMOGLOBIN (HGB A1C): HEMOGLOBIN A1C: 6

## 2016-08-15 MED ORDER — ASPIRIN 81 MG PO TABS: 81.0000 mg | ORAL_TABLET | Freq: Every day | ORAL | Status: DC

## 2016-08-15 NOTE — Assessment & Plan Note (Signed)
Controlled. Continue lisinopril. Check BMP for kidney function. Reviewed DASH diet.

## 2016-08-15 NOTE — Assessment & Plan Note (Signed)
Well controlled. Will reduce the lantus dosing with a goal to stop insulin over time. Reduce to 16 units daily. Continue metformin and amaryl. Encouraged continued diet and lifestyle recommendations. ACE for renal protection. UA micro UTD. Eye exam will be done next month.  Recheck 3 mos.

## 2016-08-15 NOTE — Assessment & Plan Note (Signed)
Taking whole tablet simvastatin daily- total cholesterol has been low. Recheck lipid panel today. Consider resuming whole tablet with continued low cholesterol.

## 2016-08-15 NOTE — Patient Outreach (Signed)
Receive a voicemail from Mr. Sabala. Outreach call to [place] regarding his request for follow up from the [facility] Medication Adherence Campaign. Speak with Mr. Saulter and identify myself. Patient reports that he is on his way to an appointment and asks that I call him back. Let Mr. Whitebread know that I will call him back when I am next in the office, on Wednesday.  Harlow Asa, PharmD Clinical Pharmacist [facility] [phone]

## 2016-08-15 NOTE — Assessment & Plan Note (Signed)
Recheck kidney function.

## 2016-08-15 NOTE — Assessment & Plan Note (Signed)
ASA and statin for secondary prevention.

## 2016-08-15 NOTE — Progress Notes (Signed)
Subjective:    Patient ID: [name], male    DOB: Jul 22, 1951, 65 y.o.   MRN: [id number]  HPI: Richard Gordon is a 65 y.o. male presenting on 08/15/2016 for Diabetes; Hypertension; and Hyperlipidemia   HPI  Pt presents for diabetes follow-up. Last A1c was 6.5%. He has reduced insulin to 20 units of lantus daily. Taking 30m amaryl in the AM, metformin 10012mBID. [facility] Checks at 9am and then again at 330pm- about 2 hours after lunch Avg 200 then. AVg am is 150. Has had some lows 80's- drank OJ to help himself feel better.  UA micro done in March. Reduced insulin to 20.  Eyes are very sensitive light. Avoids bright lights. Tries to avoid bright sunlight. Will see eye doctor next month.  Taking Lisinopril 2032maily. Not taking HCTZ.  Taking only 53m46mby aspirin. Foot tingling does well with exercise.       Past Medical History:  Diagnosis Date  . Diabetes mellitus without complication ([name])[place]. Hyperlipidemia   . Hypertension   [facility]  Current Outpatient Prescriptions on File Prior to Visit  Medication Sig  . glimepiride (AMARYL) 4 MG tablet Take 1 tablet (4 mg total) by mouth daily with breakfast.  . glucose blood (ACCU-CHEK AVIVA) test strip Check blood sugar twice daily. DX: E11.42  . Insulin Glargine (LANTUS) 100 UNIT/ML Solostar Pen Inject 10 Units into the skin daily at 10 pm.  . Insulin Pen Needle 31G X 5 MM MISC 1 each by Does not apply route daily.  . Lancets (ACCU-CHEK MULTICLIX) lancets Use with Accu-Chek meter to check blood sugar twice daily. DX:E11.42  . Lancets (ACCU-CHEK MULTICLIX) lancets 1 each by Other route 2 (two) times daily. Dx: E11.42  . lisinopril (PRINIVIL,ZESTRIL) 20 MG tablet TAKE 1 TABLET EVERY DAY  . metFORMIN (GLUCOPHAGE) 1000 MG tablet TAKE 1 TABLET TWICE DAILY  . Omega-3 Fatty Acids (FISH OIL) 1200 MG CAPS Take by mouth.  . simvastatin (ZOCOR) 20 MG tablet Take 0.5 tablets (10 mg  total) by mouth daily at 6 PM.  . blood glucose meter kit and supplies KIT Dispense based on patient and insurance preference. Use up to four times daily as directed. (FOR ICD-9 250.00, 250.01).  . Blood Glucose Monitoring Suppl (ACCU-CHEK AVIVA PLUS) w/Device KIT 1 each by Does not apply route 2 (two) times daily. Dx: E11.42   No current facility-administered medications on file prior to visit.     Review of Systems  Constitutional: Negative for chills and fever.  HENT: Negative.   Respiratory: Negative for chest tightness, shortness of breath and wheezing.   Cardiovascular: Negative for chest pain, palpitations and leg swelling.  Gastrointestinal: Negative for abdominal pain, nausea and vomiting.  Endocrine: Negative.   Genitourinary: Negative for discharge, dysuria, penile pain, testicular pain and urgency.  Musculoskeletal: Negative for arthralgias, back pain and joint swelling.  Skin: Negative.   Neurological: Negative for dizziness, weakness, numbness and headaches.  Psychiatric/Behavioral: Negative for dysphoric mood and sleep disturbance.   Per HPI unless specifically indicated above     Objective:    BP 126/60 (BP Location: Left Arm, Cuff Size: Large)   Pulse 74   Temp 97.7 F (36.5 C) (Oral)   Ht 5' 10" (1.778 m)   Wt 184 lb 3.2 oz (83.6 kg)   BMI 26.43 kg/m   Wt Readings from Last 3 Encounters:  08/15/16  184 lb 3.2 oz (83.6 kg)  05/09/16 181 lb (82.1 kg)  03/03/16 179 lb (81.2 kg)    Physical Exam  Constitutional: He is oriented to person, place, and time. He appears well-developed and well-nourished. No distress.  HENT:  Head: Normocephalic and atraumatic.  Neck: Neck supple. No thyromegaly present.  Cardiovascular: Normal rate, regular rhythm and normal heart sounds.  Exam reveals no gallop and no friction rub.   No murmur heard. Pulmonary/Chest: Effort normal and breath sounds normal. He has no wheezes.  Abdominal: Soft. Bowel sounds are normal. He exhibits  no distension. There is no tenderness. There is no rebound.  Musculoskeletal: Normal range of motion. He exhibits no edema or tenderness.  Neurological: He is alert and oriented to person, place, and time. He has normal reflexes.  Skin: Skin is warm and dry. No rash noted. No erythema.  Psychiatric: He has a normal mood and affect. His behavior is normal. Thought content normal.   Results for orders placed or performed in visit on 08/15/16  POCT HgB A1C  Result Value Ref Range   Hemoglobin A1C 6.0       Assessment & Plan:   Problem List Items Addressed This Visit      Cardiovascular and Mediastinum   Hypertension    Controlled. Continue lisinopril. Check BMP for kidney function. Reviewed DASH diet.       Relevant Medications   aspirin 81 MG tablet     Nervous and Auditory   DM type 2 with diabetic peripheral neuropathy ([place]) - Primary    Well controlled. Will reduce the lantus dosing with a goal to stop insulin over time. Reduce to 16 units daily. Continue metformin and amaryl. Encouraged continued diet and lifestyle recommendations. ACE for renal protection. UA micro UTD. Eye exam will be done next month.  Recheck 3 mos.       Relevant Medications   aspirin 81 MG tablet   Other Relevant Orders   POCT HgB A1C (Completed)     Genitourinary   Chronic kidney disease (CKD), stage III (moderate)    Recheck kidney function.       Relevant Orders   BASIC METABOLIC PANEL WITH GFR     Other   Hyperlipidemia associated with type 2 diabetes mellitus ([name])    Taking whole tablet simvastatin daily- total cholesterol has been low. Recheck lipid panel today. Consider resuming whole tablet with continued low cholesterol.       Relevant Medications   aspirin 81 MG tablet   Other Relevant Orders   Lipid Profile   Personal history of stroke with residual effects    ASA and statin for secondary prevention.        Other Visit Diagnoses    Need for influenza vaccination        Relevant Orders   Flu vaccine HIGH DOSE PF (Fluzone High dose) (Completed)   Screening for colon cancer       Relevant Orders   Cologuard      Meds ordered this encounter  Medications  . aspirin 81 MG tablet    Sig: Take 1 tablet (81 mg total) by mouth daily.    Dispense:  30 tablet    Order Specific Question:   Supervising Provider    Answer:   Arlis Porta [[id number]]      Follow up plan: Return in about 3 months (around 11/14/2016), or if symptoms worsen or fail to improve, for Diabetes. Marland Kitchen

## 2016-08-15 NOTE — Patient Instructions (Addendum)
Insulin- Let's reduce to 16 units once daily on your insulin. Continue Amaryl once daily and Metformin twice daily.   Keep up the good work with your diabetes.

## 2016-08-17 ENCOUNTER — Other Ambulatory Visit: Payer: Self-pay | Admitting: Pharmacist

## 2016-08-17 NOTE — Patient Outreach (Signed)
Outreach call again to [place] regarding his request for follow up from the [facility] Medication Adherence Campaign. HIPAA identifiers verified and verbal consent received.  Mr. Manseau states that he takes his medications as directed. Reports that he does find that he occasionally misses a dose of his medications. Patient describes his system of remembering his medications, keeping the bottles where he sees them in his kitchen and moving the bottles from one shelf to another when he has taken his dose each day. Discuss with patient a variety of strategies for helping him to remember to take his medications, such as using a pillbox or using a phone alarm. Patient expresses interest in using a phone alarm. Discuss with patient the importance of medication adherence. Patient verbalizes understanding. Patient denies any issues with affording his medications.  Patient reports that his blood sugars have been improving. Reports that his morning fasting blood sugars are usually between 130-140 mg/dL. Reports that his readings are around 160 mg/dL before supper. Patient reports that he feels like his blood sugar is low, feels "a lack of energy", when his blood sugar is in the 80s. Review with patient how to manage low blood sugars.  Patient reports that he has no further medication questions or concerns today. Confirms that he still has my phone number for future medication questions or concerns.  Harlow Asa, PharmD Clinical Pharmacist [facility] [phone]

## 2016-09-05 DIAGNOSIS — H401113 Primary open-angle glaucoma, right eye, severe stage: Secondary | ICD-10-CM | POA: Diagnosis not present

## 2016-09-08 ENCOUNTER — Ambulatory Visit (INDEPENDENT_AMBULATORY_CARE_PROVIDER_SITE_OTHER): Payer: Commercial Managed Care - HMO | Admitting: Family Medicine

## 2016-09-08 ENCOUNTER — Ambulatory Visit: Payer: Self-pay | Admitting: Family Medicine

## 2016-09-08 ENCOUNTER — Encounter: Payer: Self-pay | Admitting: Family Medicine

## 2016-09-08 VITALS — BP 144/72 | HR 83 | Temp 98.5°F | Resp 16 | Ht 70.0 in | Wt 185.0 lb

## 2016-09-08 DIAGNOSIS — I10 Essential (primary) hypertension: Secondary | ICD-10-CM

## 2016-09-08 DIAGNOSIS — E1142 Type 2 diabetes mellitus with diabetic polyneuropathy: Secondary | ICD-10-CM

## 2016-09-08 LAB — GLUCOSE, POCT (MANUAL RESULT ENTRY): POC Glucose: 234 mg/dl — AB (ref 70–99)

## 2016-09-08 MED ORDER — HYDROCHLOROTHIAZIDE 25 MG PO TABS: 12.5000 mg | ORAL_TABLET | Freq: Every day | ORAL | 3 refills | Status: DC

## 2016-09-08 NOTE — Assessment & Plan Note (Addendum)
Will begin to titrate insulin again since sugars are increasing. Reviewed diet changes and needs to eat regular meals. Pt has refused to see nutrition for diabetes refresher. Goal to increase 2 units every 7 days until fasting sugars <130. Hypoglycemia protocol reviewed.  Recheck 2 mos or PRN.

## 2016-09-08 NOTE — Assessment & Plan Note (Addendum)
BP is elevated today. Pt had stopped taking HCTZ at home 2/2 low BP. Will restart today at 1/2 dose. Recheck BP in 2 weeks.

## 2016-09-08 NOTE — Progress Notes (Signed)
Subjective:    Patient ID: Richard Gordon, male    DOB: 1951-09-06, 65 y.o.   MRN: [id number]  HPI: Richard Gordon is a 65 y.o. male presenting on 09/08/2016 for Diabetes   HPI  Pt presents for concerns about his blood sugars. It is creeping up daily according him. He reports he has not been eating right. AM sugar is 160 today. Poor appetite. Just not hungry. No nausea, no vomiting, no abdominal pain. This AM he had a breakfast burrito (eggs and sausage) can of beanie weenies, plans to have a sub for dinner. Checked sugar about 30 minutes prior to coming here- was 260 but he head eating 1 hour prior. Low blood glucose of 80- felt low at the time. Ate some candy.  Doing 15 units of Lantus daily.   Past Medical History:  Diagnosis Date  . Diabetes mellitus without complication ([place])   . Hyperlipidemia   . Hypertension   . Stroke [facility])     Current Outpatient Prescriptions on File Prior to Visit  Medication Sig  . aspirin 81 MG tablet Take 1 tablet (81 mg total) by mouth daily.  . blood glucose meter kit and supplies KIT Dispense based on patient and insurance preference. Use up to four times daily as directed. (FOR ICD-9 250.00, 250.01).  . Blood Glucose Monitoring Suppl (ACCU-CHEK AVIVA PLUS) w/Device KIT 1 each by Does not apply route 2 (two) times daily. Dx: E11.42  . glimepiride (AMARYL) 4 MG tablet Take 1 tablet (4 mg total) by mouth daily with breakfast.  . glucose blood (ACCU-CHEK AVIVA) test strip Check blood sugar twice daily. DX: E11.42  . Insulin Glargine (LANTUS) 100 UNIT/ML Solostar Pen Inject 10 Units into the skin daily at 10 pm.  . Insulin Pen Needle 31G X 5 MM MISC 1 each by Does not apply route daily.  . Lancets (ACCU-CHEK MULTICLIX) lancets Use with Accu-Chek meter to check blood sugar twice daily. DX:E11.42  . Lancets (ACCU-CHEK MULTICLIX) lancets 1 each by Other route 2 (two) times daily. Dx: E11.42  . lisinopril (PRINIVIL,ZESTRIL) 20 MG tablet TAKE 1 TABLET EVERY  DAY  . metFORMIN (GLUCOPHAGE) 1000 MG tablet TAKE 1 TABLET TWICE DAILY  . Omega-3 Fatty Acids (FISH OIL) 1200 MG CAPS Take by mouth.  . simvastatin (ZOCOR) 20 MG tablet Take 0.5 tablets (10 mg total) by mouth daily at 6 PM.   No current facility-administered medications on file prior to visit.     Review of Systems  Constitutional: Negative for chills and fever.  HENT: Negative.   Respiratory: Negative for chest tightness, shortness of breath and wheezing.   Cardiovascular: Negative for chest pain, palpitations and leg swelling.  Gastrointestinal: Negative for abdominal pain, nausea and vomiting.  Endocrine: Negative.   Genitourinary: Negative for discharge, dysuria, penile pain, testicular pain and urgency.  Musculoskeletal: Negative for arthralgias, back pain and joint swelling.  Skin: Negative.   Neurological: Negative for dizziness, weakness, numbness and headaches.  Psychiatric/Behavioral: Negative for dysphoric mood and sleep disturbance.   Per HPI unless specifically indicated above     Objective:    BP (!) 144/72 (BP Location: Right Arm)   Pulse 83   Temp 98.5 F (36.9 C) (Oral)   Resp 16   Ht 5' 10" (1.778 m)   Wt 185 lb (83.9 kg)   BMI 26.54 kg/m   Wt Readings from Last 3 Encounters:  09/08/16 185 lb (83.9 kg)  08/15/16 184 lb 3.2 oz (83.6 kg)  05/09/16 181 lb (82.1 kg)    Physical Exam  Constitutional: He is oriented to person, place, and time. He appears well-developed and well-nourished. No distress.  HENT:  Head: Normocephalic and atraumatic.  Neck: Neck supple. No thyromegaly present.  Cardiovascular: Normal rate, regular rhythm and normal heart sounds.  Exam reveals no gallop and no friction rub.   No murmur heard. Pulmonary/Chest: Effort normal and breath sounds normal. He has no wheezes.  Abdominal: Soft. Bowel sounds are normal. He exhibits no distension. There is no tenderness. There is no rebound.  Musculoskeletal: Normal range of motion. He  exhibits no edema or tenderness.  Neurological: He is alert and oriented to person, place, and time. He has normal reflexes.  Skin: Skin is warm and dry. No rash noted. No erythema.  Psychiatric: He has a normal mood and affect. His behavior is normal. Thought content normal.   Results for orders placed or performed in visit on 09/08/16  POCT Glucose (CBG)  Result Value Ref Range   POC Glucose 234 (A) 70 - 99 mg/dl      Assessment & Plan:   Problem List Items Addressed This Visit      Cardiovascular and Mediastinum   Hypertension    BP is elevated today. Pt had stopped taking HCTZ at home 2/2 low BP. Will restart today at 1/2 dose. Recheck BP in 2 weeks.       Relevant Medications   hydrochlorothiazide (HYDRODIURIL) 25 MG tablet     Endocrine   DM type 2 with diabetic peripheral neuropathy ([place]) - Primary    Will begin to titrate insulin again since sugars are increasing. Reviewed diet changes and needs to eat regular meals. Pt has refused to see nutrition for diabetes refresher. Goal to increase 2 units every 7 days until fasting sugars <130. Hypoglycemia protocol reviewed.  Recheck 2 mos or PRN.       Relevant Orders   POCT Glucose (CBG) (Completed)    Other Visit Diagnoses   None.     Meds ordered this encounter  Medications  . DISCONTD: gabapentin (NEURONTIN) 300 MG capsule    Sig: Take 300 mg by mouth 3 (three) times daily.  Marland Kitchen latanoprost (XALATAN) 0.005 % ophthalmic solution    Sig: Place 1 drop into both eyes as directed.  Marland Kitchen DISCONTD: hydrochlorothiazide (HYDRODIURIL) 25 MG tablet    Sig: Take 25 mg by mouth daily.  . hydrochlorothiazide (HYDRODIURIL) 25 MG tablet    Sig: Take 0.5 tablets (12.5 mg total) by mouth daily.    Dispense:  45 tablet    Refill:  3    Order Specific Question:   Supervising Provider    Answer:   Arlis Porta [[id number]]      Follow up plan: Return in about 2 weeks (around 09/22/2016) for BP check. Marland Kitchen

## 2016-09-08 NOTE — Patient Instructions (Addendum)
Since you are concerned about your sugars going up- let's increase your insulin until your fasting blood glucose is <130.  Increase your insulin by 2 units every 7 days for an average fasting glucose of <130.  If you fasting sugars are less than 130 please continue same dose of insulin.  Your BP is high today- start taking 1/2 tablet of HCTZ once daily. I would like to check your BP in 2 weeks.   Please seek immediate medical attention at ER or Urgent Care if you develop: Chest pain, pressure or tightness. Shortness of breath accompanied by nausea or diaphoresis Visual changes Numbness or tingling on one side of the body Facial droop Altered mental status Or any concerning symptoms.

## 2016-09-22 ENCOUNTER — Ambulatory Visit (INDEPENDENT_AMBULATORY_CARE_PROVIDER_SITE_OTHER): Payer: Commercial Managed Care - HMO | Admitting: Family Medicine

## 2016-09-22 ENCOUNTER — Encounter: Payer: Self-pay | Admitting: Family Medicine

## 2016-09-22 VITALS — BP 138/72 | HR 78 | Temp 97.9°F | Resp 16 | Ht 70.0 in | Wt 183.0 lb

## 2016-09-22 DIAGNOSIS — I10 Essential (primary) hypertension: Secondary | ICD-10-CM | POA: Diagnosis not present

## 2016-09-22 DIAGNOSIS — E1142 Type 2 diabetes mellitus with diabetic polyneuropathy: Secondary | ICD-10-CM | POA: Diagnosis not present

## 2016-09-22 DIAGNOSIS — L209 Atopic dermatitis, unspecified: Secondary | ICD-10-CM

## 2016-09-22 MED ORDER — TRIAMCINOLONE ACETONIDE 0.1 % EX CREA: 1.0000 "application " | TOPICAL_CREAM | Freq: Two times a day (BID) | CUTANEOUS | 0 refills | Status: DC

## 2016-09-22 NOTE — Assessment & Plan Note (Signed)
Believe L arm rash may be reaction to home ointment. Atopic lesions along both forearms. Advised pt to apply no medications for a few days. Wash in warm water. Apply vaseline or hypoallergenic lotion. If rash improves can change to PRN triamcinolone. If rash does not improve- consider referral to dermatology.

## 2016-09-22 NOTE — Assessment & Plan Note (Signed)
BP is controlled today. Continue 1/2 tablet of HCTZ once daily. Alarm symptoms. Recheck in 3 mos.

## 2016-09-22 NOTE — Patient Instructions (Addendum)
Keep titrating the insulin until your fasting blood sugars are < 130 in the AM. Add 2 units every 7 days to your Lantus.  Watch carefully what you eat. Aim for low carb.  Arm: Stop applying the hydrocortisone ointment for a few days. Apply just a gentle lotion- vaseline, Sarna.  Keep the skin moist. You can take Claritin or benadryl to help with the lesions.  Wait to see if rash resolves. If not improving- you will need to go to dermatology.   Please check your blood glucose 1-2times daily. If your glucose is < 70 mg/dl or you have symptoms of hypoglycemia confusion, dizziness, hunger, jitteriness and sweating please drink 4 oz of juice or soda.  Check blood glucose 15 minutes later. If it has not risen to >100, please seek medical attention. If > 100 please eat a snack containing protein such as peanut butter and crackers.

## 2016-09-22 NOTE — Assessment & Plan Note (Signed)
Increasing blood sugars are likely 2/2 diet changes. Advised pt to watch diet closely. Continue to titrate insulin until sugars are <130 fasting. With A1c increase- consider adding post-prandial insulin or GLP-1 agonist.  Recheck 2 mos.

## 2016-09-22 NOTE — Progress Notes (Signed)
Subjective:    Patient ID: Richard Gordon, male    DOB: 11-18-1951, 65 y.o.   MRN: [id number]  HPI: [name] is a 65 y.o. male presenting on 09/22/2016 for Diabetes   HPI  Pt presents for concerns about his diabetes. His sugars are averaging in 200 in the AM. He has titrated his lantus up to 19 units.  BP is controlled today by taking 1/2 HCTZ. No chest pain. No visual changes. No chest pain.  Rash on ARm- started 1 week ago.He has a history of atopic dermatitis and frequently has red scaling lesions on arms and hands. Thought it was his watch was causing a large lesion on the L forearm near the wrist. Started apply hydrocortisone ointment to the entire forearm to elbow. Now has red spots up the arm and red skin. The original area improved with steroid cream. Skin is red and irritated. Non-tender. Not pruritic.   Past Medical History:  Diagnosis Date  . Diabetes mellitus without complication ([place])   . Hyperlipidemia   . Hypertension   . Stroke [facility])     Current Outpatient Prescriptions on File Prior to Visit  Medication Sig  . aspirin 81 MG tablet Take 1 tablet (81 mg total) by mouth daily.  . blood glucose meter kit and supplies KIT Dispense based on patient and insurance preference. Use up to four times daily as directed. (FOR ICD-9 250.00, 250.01).  . Blood Glucose Monitoring Suppl (ACCU-CHEK AVIVA PLUS) w/Device KIT 1 each by Does not apply route 2 (two) times daily. Dx: E11.42  . glimepiride (AMARYL) 4 MG tablet Take 1 tablet (4 mg total) by mouth daily with breakfast.  . glucose blood (ACCU-CHEK AVIVA) test strip Check blood sugar twice daily. DX: E11.42  . hydrochlorothiazide (HYDRODIURIL) 25 MG tablet Take 0.5 tablets (12.5 mg total) by mouth daily.  . Insulin Glargine (LANTUS) 100 UNIT/ML Solostar Pen Inject 10 Units into the skin daily at 10 pm.  . Insulin Pen Needle 31G X 5 MM MISC 1 each by Does not apply route daily.  . Lancets (ACCU-CHEK MULTICLIX) lancets Use  with Accu-Chek meter to check blood sugar twice daily. DX:E11.42  . Lancets (ACCU-CHEK MULTICLIX) lancets 1 each by Other route 2 (two) times daily. Dx: E11.42  . latanoprost (XALATAN) 0.005 % ophthalmic solution Place 1 drop into both eyes as directed.  Marland Kitchen lisinopril (PRINIVIL,ZESTRIL) 20 MG tablet TAKE 1 TABLET EVERY DAY  . metFORMIN (GLUCOPHAGE) 1000 MG tablet TAKE 1 TABLET TWICE DAILY  . Omega-3 Fatty Acids (FISH OIL) 1200 MG CAPS Take by mouth.  . simvastatin (ZOCOR) 20 MG tablet Take 0.5 tablets (10 mg total) by mouth daily at 6 PM.   No current facility-administered medications on file prior to visit.     Review of Systems  Constitutional: Negative for chills and fever.  HENT: Negative.   Eyes: Negative for photophobia and visual disturbance.  Respiratory: Negative for chest tightness, shortness of breath and wheezing.   Cardiovascular: Negative for chest pain, palpitations and leg swelling.  Gastrointestinal: Negative for abdominal pain, nausea and vomiting.  Endocrine: Negative.  Negative for polydipsia, polyphagia and polyuria.  Genitourinary: Negative for discharge, dysuria, penile pain, testicular pain and urgency.  Musculoskeletal: Negative for arthralgias, back pain and joint swelling.  Skin: Positive for rash.  Neurological: Negative for dizziness, weakness, numbness and headaches.  Psychiatric/Behavioral: Negative for dysphoric mood and sleep disturbance.   Per HPI unless specifically indicated above     Objective:  BP 138/72   Pulse 78   Temp 97.9 F (36.6 C) (Oral)   Resp 16   Ht 5' 10" (1.778 m)   Wt 183 lb (83 kg)   BMI 26.26 kg/m   Wt Readings from Last 3 Encounters:  09/22/16 183 lb (83 kg)  09/08/16 185 lb (83.9 kg)  08/15/16 184 lb 3.2 oz (83.6 kg)    Physical Exam  Constitutional: He is oriented to person, place, and time. He appears well-developed and well-nourished. No distress.  HENT:  Head: Normocephalic and atraumatic.  Neck: Neck supple.  No thyromegaly present.  Cardiovascular: Normal rate, regular rhythm and normal heart sounds.  Exam reveals no gallop and no friction rub.   No murmur heard. Pulmonary/Chest: Effort normal and breath sounds normal. He has no wheezes.  Abdominal: Soft. Bowel sounds are normal. He exhibits no distension. There is no tenderness. There is no rebound.  Musculoskeletal: Normal range of motion. He exhibits no edema or tenderness.  Neurological: He is alert and oriented to person, place, and time. He has normal reflexes.  Skin: Skin is warm and dry. Rash noted. No purpura noted. Rash is macular. Rash is not vesicular. No erythema.  Red scaling lesions and along both forearms. L forearm is red with dark red lesion. Non-tender. No pus or drainage.   Psychiatric: He has a normal mood and affect. His behavior is normal. Thought content normal.   Results for orders placed or performed in visit on 09/08/16  POCT Glucose (CBG)  Result Value Ref Range   POC Glucose 234 (A) 70 - 99 mg/dl      Assessment & Plan:   Problem List Items Addressed This Visit      Cardiovascular and Mediastinum   Hypertension    BP is controlled today. Continue 1/2 tablet of HCTZ once daily. Alarm symptoms. Recheck in 3 mos.         Endocrine   DM type 2 with diabetic peripheral neuropathy ([place]) - Primary    Increasing blood sugars are likely 2/2 diet changes. Advised pt to watch diet closely. Continue to titrate insulin until sugars are <130 fasting. With A1c increase- consider adding post-prandial insulin or GLP-1 agonist.  Recheck 2 mos.         Musculoskeletal and Integument   AD (atopic dermatitis)    Believe L arm rash may be reaction to home ointment. Atopic lesions along both forearms. Advised pt to apply no medications for a few days. Wash in warm water. Apply vaseline or hypoallergenic lotion. If rash improves can change to PRN triamcinolone. If rash does not improve- consider referral to dermatology.        Relevant Medications   triamcinolone cream (KENALOG) 0.1 %    Other Visit Diagnoses   None.     Meds ordered this encounter  Medications  . triamcinolone cream (KENALOG) 0.1 %    Sig: Apply 1 application topically 2 (two) times daily.    Dispense:  30 g    Refill:  0    Order Specific Question:   Supervising Provider    Answer:   Arlis Porta [[id number]]      Follow up plan: Return if symptoms worsen or fail to improve, for Please keep schedule appt in December. Marland Kitchen

## 2016-11-21 ENCOUNTER — Encounter: Payer: Self-pay | Admitting: Family Medicine

## 2016-11-21 ENCOUNTER — Ambulatory Visit (INDEPENDENT_AMBULATORY_CARE_PROVIDER_SITE_OTHER): Payer: Commercial Managed Care - HMO | Admitting: Family Medicine

## 2016-11-21 ENCOUNTER — Ambulatory Visit: Payer: Self-pay | Admitting: Family Medicine

## 2016-11-21 VITALS — BP 119/69 | HR 79 | Temp 97.8°F | Ht 70.0 in | Wt 184.5 lb

## 2016-11-21 DIAGNOSIS — I6930 Unspecified sequelae of cerebral infarction: Secondary | ICD-10-CM

## 2016-11-21 DIAGNOSIS — I10 Essential (primary) hypertension: Secondary | ICD-10-CM

## 2016-11-21 DIAGNOSIS — E1142 Type 2 diabetes mellitus with diabetic polyneuropathy: Secondary | ICD-10-CM

## 2016-11-21 DIAGNOSIS — E1169 Type 2 diabetes mellitus with other specified complication: Secondary | ICD-10-CM | POA: Diagnosis not present

## 2016-11-21 DIAGNOSIS — E785 Hyperlipidemia, unspecified: Secondary | ICD-10-CM

## 2016-11-21 LAB — HEMOGLOBIN A1C: HEMOGLOBIN A1C: 6.3 % (ref 4.6–6.5)

## 2016-11-21 MED ORDER — CLOPIDOGREL BISULFATE 75 MG PO TABS: 75.0000 mg | ORAL_TABLET | Freq: Every day | ORAL | 3 refills | Status: DC

## 2016-11-21 NOTE — Assessment & Plan Note (Signed)
At goal.  Continue Lantus, Metformin, Amaryl.  A1C today.

## 2016-11-21 NOTE — Assessment & Plan Note (Signed)
At goal.  Continue Simvastatin.

## 2016-11-21 NOTE — Assessment & Plan Note (Signed)
Stopping aspirin. Starting plavix.

## 2016-11-21 NOTE — Patient Instructions (Signed)
You're doing well.  Stop the aspirin. Start the plavix.  Continue your other meds.  Follow up in 3 months.  Take care  Dr. Lacinda Axon

## 2016-11-21 NOTE — Progress Notes (Signed)
Subjective:  Patient ID: Richard Gordon, male    DOB: 13-Sep-1951  Age: 65 y.o. MRN: [id number]  CC: Establish care  HPI Richard Gordon is a 65 y.o. male presents to the clinic today to establish care. Issues are below.  Hx of Stroke  Stroke x 2.  Is currently on Aspirin, and Simvastatin.  Will discuss change in Antiplatelet therapy today.   HTN  At goal on Lisinopril and HCTZ.  DM-2  In need of A1C.  CBG's have been well controlled.  Currently on Lantus 30 units QHS, Metformin, and Amaryl.  HLD  LDL at goal on Simvastatin.  PMH, Surgical Hx, Family Hx, Social History reviewed and updated as below.  Past Medical History:  Diagnosis Date  . Chronic kidney disease (CKD), stage III (moderate) 11/03/2015  . Diabetes mellitus without complication (Richard Gordon)   . Hyperlipidemia   . Hypertension   . Stroke [facility])    Past Surgical History:  Procedure Laterality Date  . CATARACT EXTRACTION    . TONSILLECTOMY     Family History  Problem Relation Age of Onset  . Hypertension Father   . Diabetes Mother    Social History  Substance Use Topics  . Smoking status: Former Research scientist (life sciences)  . Smokeless tobacco: Never Used  . Alcohol use No   Review of Systems General: Denies unexplained weight loss, fever. Skin: Denies new or changing mole, sore/wound that won't heal. ENT: Trouble hearing, ringing in the ears, sores in the mouth, hoarseness, trouble swallowing. Eyes: Denies trouble seeing/visual disturbance. Heart/CV: Denies chest pain, shortness of breath, edema, palpitations. Lungs/Resp: Denies cough, shortness of breath, hemoptysis. Abd/GI: Denies nausea, vomiting, diarrhea, constipation, abdominal pain, hematochezia, melena. GU: Denies dysuria, incontinence, hematuria, urinary frequency, difficulty starting/keeping stream, penile discharge, sexual difficulty, lump in testicles. MSK: Denies joint pain/swelling, myalgias. Neuro: Denies headaches, weakness, numbness, dizziness,  syncope. Psych: Denies sadness, anxiety, stress, memory difficulty. Endocrine: Denies polyuria and polydipsia.  Objective:   Today's Vitals: BP 119/69 (BP Location: Left Arm, Patient Position: Sitting, Cuff Size: Normal)   Pulse 79   Temp 97.8 F (36.6 C) (Oral)   Ht 5' 10" (1.778 m) Comment: with shoes  Wt 184 lb 8 oz (83.7 kg)   BMI 26.47 kg/m   Physical Exam  Constitutional: He is oriented to person, place, and time. He appears well-developed. No distress.  HENT:  Head: Normocephalic and atraumatic.  Eyes: Conjunctivae are normal.  Neck: Neck supple.  Cardiovascular: Normal rate and regular rhythm.   Pulmonary/Chest: Effort normal and breath sounds normal.  Abdominal: Soft. He exhibits no distension. There is no tenderness. There is no rebound.  Musculoskeletal: Normal range of motion.  Neurological: He is alert and oriented to person, place, and time.  Skin: Skin is warm.  Psychiatric: He has a normal mood and affect.  Vitals reviewed.  Assessment & Plan:   Problem List Items Addressed This Visit    Personal history of stroke with residual effects - Primary    Stopping aspirin. Starting plavix.      Hypertension    At goal. Continue Lisinopril and HCTZ.      Hyperlipidemia associated with type 2 diabetes mellitus (Richard Gordon)    At goal.  Continue Simvastatin.       DM type 2 with diabetic peripheral neuropathy ([name])    At goal.  Continue Lantus, Metformin, Amaryl.  A1C today.      Relevant Orders   Hemoglobin A1c      Outpatient Encounter Prescriptions  as of 11/21/2016  Medication Sig  . glimepiride (AMARYL) 4 MG tablet Take 1 tablet (4 mg total) by mouth daily with breakfast.  . glucose blood (ACCU-CHEK AVIVA) test strip Check blood sugar twice daily. DX: E11.42  . hydrochlorothiazide (HYDRODIURIL) 25 MG tablet Take 0.5 tablets (12.5 mg total) by mouth daily.  . Insulin Glargine (LANTUS) 100 UNIT/ML Solostar Pen Inject 10 Units into the skin daily at 10  pm.  . Insulin Pen Needle 31G X 5 MM MISC 1 each by Does not apply route daily.  . Lancets (ACCU-CHEK MULTICLIX) lancets Use with Accu-Chek meter to check blood sugar twice daily. DX:E11.42  . latanoprost (XALATAN) 0.005 % ophthalmic solution Place 1 drop into both eyes as directed.  Marland Kitchen lisinopril (PRINIVIL,ZESTRIL) 20 MG tablet TAKE 1 TABLET EVERY DAY  . metFORMIN (GLUCOPHAGE) 1000 MG tablet TAKE 1 TABLET TWICE DAILY  . Omega-3 Fatty Acids (FISH OIL) 1200 MG CAPS Take by mouth.  . simvastatin (ZOCOR) 20 MG tablet Take 0.5 tablets (10 mg total) by mouth daily at 6 PM.  . triamcinolone cream (KENALOG) 0.1 % Apply 1 application topically 2 (two) times daily.  . [DISCONTINUED] aspirin 81 MG tablet Take 1 tablet (81 mg total) by mouth daily.  . clopidogrel (PLAVIX) 75 MG tablet Take 1 tablet (75 mg total) by mouth daily.  . [DISCONTINUED] blood glucose meter kit and supplies KIT Dispense based on patient and insurance preference. Use up to four times daily as directed. (FOR ICD-9 250.00, 250.01).  . [DISCONTINUED] Blood Glucose Monitoring Suppl (ACCU-CHEK AVIVA PLUS) w/Device KIT 1 each by Does not apply route 2 (two) times daily. Dx: E11.42  . [DISCONTINUED] Lancets (ACCU-CHEK MULTICLIX) lancets 1 each by Other route 2 (two) times daily. Dx: E11.42   No facility-administered encounter medications on file as of 11/21/2016.     Follow-up: Return in about 3 months (around 02/19/2017).  [place]

## 2016-11-21 NOTE — Assessment & Plan Note (Signed)
At goal. Continue Lisinopril and HCTZ.

## 2016-11-21 NOTE — Progress Notes (Signed)
Pre visit review using our clinic review tool, if applicable. No additional management support is needed unless otherwise documented below in the visit note.

## 2016-11-22 ENCOUNTER — Other Ambulatory Visit: Payer: Self-pay

## 2016-11-22 MED ORDER — CLOPIDOGREL BISULFATE 75 MG PO TABS: 75.0000 mg | ORAL_TABLET | Freq: Every day | ORAL | 3 refills | Status: DC

## 2016-12-26 ENCOUNTER — Ambulatory Visit: Payer: Self-pay | Admitting: Family Medicine

## 2017-01-12 ENCOUNTER — Other Ambulatory Visit: Payer: Self-pay | Admitting: Family Medicine

## 2017-01-12 DIAGNOSIS — E1142 Type 2 diabetes mellitus with diabetic polyneuropathy: Secondary | ICD-10-CM

## 2017-01-12 MED ORDER — INSULIN GLARGINE 100 UNIT/ML SOLOSTAR PEN: 10.0000 [IU] | PEN_INJECTOR | Freq: Every day | SUBCUTANEOUS | 11 refills | Status: DC

## 2017-01-12 MED ORDER — METFORMIN HCL 1000 MG PO TABS: 1000.0000 mg | ORAL_TABLET | Freq: Two times a day (BID) | ORAL | 3 refills | Status: DC

## 2017-01-12 NOTE — Telephone Encounter (Signed)
Metformin refilled 01/15/2016. lantus refilled 02/04/16. Pt last seen 11/21/16. Last labs 11/21/16.

## 2017-01-18 ENCOUNTER — Other Ambulatory Visit: Payer: Self-pay | Admitting: Family Medicine

## 2017-01-18 MED ORDER — GLIMEPIRIDE 4 MG PO TABS: 4.0000 mg | ORAL_TABLET | Freq: Every day | ORAL | 3 refills | Status: DC

## 2017-01-18 MED ORDER — SIMVASTATIN 20 MG PO TABS: 10.0000 mg | ORAL_TABLET | Freq: Every day | ORAL | 3 refills | Status: DC

## 2017-01-18 MED ORDER — LISINOPRIL 20 MG PO TABS: 20.0000 mg | ORAL_TABLET | Freq: Every day | ORAL | 3 refills | Status: DC

## 2017-01-25 ENCOUNTER — Telehealth: Payer: Self-pay | Admitting: Family Medicine

## 2017-01-25 NOTE — Telephone Encounter (Signed)
Pt would to have his insulin delivered to office, like he had at his previous provider. Pt brought in a phone  Number; company is [employer], [phone]. Pt states they will explain to you how to do this and will send papers to be completed by Dr. Lacinda Axon. Pt stated he could not call to get papers that his provider must call. If you have any questions please call pt at [phone].

## 2017-01-26 NOTE — Telephone Encounter (Signed)
Called [employer] and they are faxing over new updated form with Dr.Cook;s name has PCP. Pt was made aware of this.

## 2017-01-26 NOTE — Telephone Encounter (Signed)
Please call and arrange.

## 2017-01-31 NOTE — Telephone Encounter (Signed)
Pt came into check on the status of this.. Please advise

## 2017-02-01 NOTE — Telephone Encounter (Signed)
[employer] was called again due to our office not receiving paperwork for pt insulin. A voicemail was left letting pt know.

## 2017-02-01 NOTE — Telephone Encounter (Signed)
Form completed and faxed.

## 2017-02-07 NOTE — Telephone Encounter (Signed)
Was called and toldl we received confirmation from [employer] in regards to Lantus RX.

## 2017-02-09 ENCOUNTER — Telehealth: Payer: Self-pay | Admitting: Family Medicine

## 2017-02-09 NOTE — Telephone Encounter (Signed)
Pt came by office and was given forms to sign for [employer] and paperwork will be faxed today 02/09/17.

## 2017-02-09 NOTE — Telephone Encounter (Signed)
A voicemail was left letting pt know that he will need to come by and sign some paperwork. This paperwork is at my desk for him to sign.

## 2017-02-13 ENCOUNTER — Telehealth: Payer: Self-pay | Admitting: *Deleted

## 2017-02-16 NOTE — Telephone Encounter (Addendum)
A secondary patient assistant form was filed out. Form was received and stated that medication requested by pt was denied via [employer] due to pt not meeting 5% out of pocket. Pt was made aware of this and told to call insurance to see what he would have to pay out of pocket to get through [facility] order. Pt was told that he could be seen by pharmacist here in the office on a Monday to talk about other insulin options if unable to afford current insulin. Pt agreed and will call insurance first. He still has three pens left.  Lowella Grip, [facility] was consulted in regards to pt and medication. He will touch base with patient.

## 2017-02-20 ENCOUNTER — Encounter: Payer: Self-pay | Admitting: Family Medicine

## 2017-02-20 ENCOUNTER — Ambulatory Visit (INDEPENDENT_AMBULATORY_CARE_PROVIDER_SITE_OTHER): Payer: Medicare PPO | Admitting: Family Medicine

## 2017-02-20 VITALS — BP 124/82 | HR 73 | Temp 97.8°F | Wt 186.5 lb

## 2017-02-20 DIAGNOSIS — E1169 Type 2 diabetes mellitus with other specified complication: Secondary | ICD-10-CM

## 2017-02-20 DIAGNOSIS — I10 Essential (primary) hypertension: Secondary | ICD-10-CM

## 2017-02-20 DIAGNOSIS — E1142 Type 2 diabetes mellitus with diabetic polyneuropathy: Secondary | ICD-10-CM

## 2017-02-20 DIAGNOSIS — E785 Hyperlipidemia, unspecified: Secondary | ICD-10-CM | POA: Diagnosis not present

## 2017-02-20 MED ORDER — INSULIN GLARGINE 100 UNIT/ML SOLOSTAR PEN: 30.0000 [IU] | PEN_INJECTOR | Freq: Every day | SUBCUTANEOUS | 11 refills | Status: DC

## 2017-02-20 NOTE — Progress Notes (Signed)
Pre visit review using our clinic review tool, if applicable. No additional management support is needed unless otherwise documented below in the visit note.

## 2017-02-20 NOTE — Assessment & Plan Note (Signed)
At goal. Continue current medications.

## 2017-02-20 NOTE — Patient Instructions (Signed)
Continue medications.  Follow-up in 6 months.  You're doing well at this time.  Take care  Dr. Lacinda Axon

## 2017-02-20 NOTE — Progress Notes (Signed)
Subjective:  Patient ID: Richard Gordon, male    DOB: 27-Sep-1951  Age: 66 y.o. MRN: [id number]  CC: Follow up  HPI:  66 year old male with DM 2 with complications, hyperlipidemia, hypertension, CKD, history of stroke presents for follow up.  DM 2  Well controlled. Blood sugar was 141 this morning.  Endorses compliance with metformin, insulin, Amaryl.  Hypertension  At goal on HCTZ, lisinopril.  Hyperlipidemia  At goal on simvastatin.   Social Hx   Social History   Social History  . Marital status: Single    Spouse name: N/A  . Number of children: N/A  . Years of education: N/A   Social History Main Topics  . Smoking status: Former Research scientist (life sciences)  . Smokeless tobacco: Never Used  . Alcohol use No  . Drug use: No  . Sexual activity: Not Currently    Partners: Female   Other Topics Concern  . None   Social History Narrative   ** Merged History Encounter **        Review of Systems  Constitutional: Negative.   Respiratory: Negative.   Cardiovascular: Negative.    Objective:  BP 124/82   Pulse 73   Temp 97.8 F (36.6 C) (Oral)   Wt 186 lb 8 oz (84.6 kg)   SpO2 94%   BMI 26.76 kg/m   BP/Weight 02/20/2017 11/21/2016 65/78/4696  Systolic BP 295 284 132  Diastolic BP 82 69 72  Wt. (Lbs) 186.5 184.5 183  BMI 26.76 26.47 26.26    Physical Exam  Constitutional: He is oriented to person, place, and time. He appears well-developed. No distress.  Cardiovascular: Normal rate and regular rhythm.   Pulmonary/Chest: Effort normal. He has no wheezes. He has no rales.  Neurological: He is alert and oriented to person, place, and time.  Psychiatric: He has a normal mood and affect.  Vitals reviewed.   Lab Results  Component Value Date   WBC 4.4 02/14/2016   HGB 12.9 (L) 02/14/2016   HCT 37.0 (L) 02/14/2016   PLT 177 02/14/2016   GLUCOSE 99 08/15/2016   CHOL 109 (L) 08/15/2016   TRIG 251 (H) 08/15/2016   HDL 19 (L) 08/15/2016   LDLCALC 40 08/15/2016   ALT  27 07/10/2013   AST 14 (L) 07/10/2013   NA 139 08/15/2016   K 4.3 08/15/2016   CL 104 08/15/2016   CREATININE 1.19 08/15/2016   BUN 14 08/15/2016   CO2 26 08/15/2016   HGBA1C 6.3 11/21/2016   MICROALBUR 20 03/03/2016    Assessment & Plan:   Problem List Items Addressed This Visit    DM type 2 with diabetic peripheral neuropathy ([place]) - Primary    At goal. Continue current medications.      Hyperlipidemia associated with type 2 diabetes mellitus ([place])    At goal. Continue simvastatin.      Hypertension    At goal. Continue HCTZ and lisinopril.        Follow-up: 6 months.  [place]

## 2017-02-20 NOTE — Assessment & Plan Note (Signed)
At goal. Continue HCTZ and lisinopril.

## 2017-02-20 NOTE — Assessment & Plan Note (Signed)
At goal. Continue simvastatin.

## 2017-03-01 ENCOUNTER — Telehealth: Payer: Self-pay | Admitting: Pharmacist

## 2017-03-01 NOTE — Telephone Encounter (Signed)
03/01/17  Received call from patient regarding his insulin.  Patient previously had difficulty affording his insulin and had received assistance from manufacturer patient assistance.  Patient states he has been approved for partial extra help which will lower his copay with [facility] mail order for a 90 day supply to $45.  Patient states that he can afford this copay and does not need extra assistance at this time.     Bennye Alm, PharmD, [place] PGY2 Pharmacy Resident [phone]

## 2017-03-27 ENCOUNTER — Telehealth: Payer: Self-pay | Admitting: Pharmacist

## 2017-03-27 DIAGNOSIS — E1169 Type 2 diabetes mellitus with other specified complication: Secondary | ICD-10-CM

## 2017-03-27 DIAGNOSIS — I10 Essential (primary) hypertension: Secondary | ICD-10-CM

## 2017-03-27 DIAGNOSIS — E1142 Type 2 diabetes mellitus with diabetic polyneuropathy: Secondary | ICD-10-CM

## 2017-03-27 DIAGNOSIS — E785 Hyperlipidemia, unspecified: Principal | ICD-10-CM

## 2017-03-27 MED ORDER — ACCU-CHEK SOFT TOUCH LANCETS MISC
3 refills | Status: DC
Start: 2017-03-27 — End: 2018-10-26

## 2017-03-27 MED ORDER — GLUCOSE BLOOD VI STRP: ORAL_STRIP | 3 refills | Status: DC

## 2017-03-27 MED ORDER — HYDROCHLOROTHIAZIDE 12.5 MG PO TABS: 12.5000 mg | ORAL_TABLET | Freq: Every day | ORAL | 0 refills | Status: DC

## 2017-03-27 NOTE — Telephone Encounter (Signed)
Received request from [facility] requesting 90 day supply for HCTZ, Accu-chek aviva plus test strips and lancets.  90 day prescriptions were sent to [facility] mail order per Dr Lacinda Axon.    Bennye Alm, PharmD, [place] PGY2 Pharmacy Resident [phone]

## 2017-04-28 ENCOUNTER — Telehealth: Payer: Self-pay | Admitting: Family Medicine

## 2017-04-28 MED ORDER — LISINOPRIL 20 MG PO TABS: 20.0000 mg | ORAL_TABLET | Freq: Every day | ORAL | 0 refills | Status: DC

## 2017-04-28 NOTE — Telephone Encounter (Signed)
Rx sent, thanks

## 2017-04-28 NOTE — Telephone Encounter (Signed)
Pt is out of lisinopril (PRINIVIL,ZESTRIL) 20 MG tablet and would like a refill. He would like it sent to [facility] on [address] Just this time since he is out of medication. His phone number is [phone].

## 2017-05-04 ENCOUNTER — Other Ambulatory Visit: Payer: Self-pay

## 2017-05-04 DIAGNOSIS — E1142 Type 2 diabetes mellitus with diabetic polyneuropathy: Secondary | ICD-10-CM

## 2017-05-04 MED ORDER — INSULIN PEN NEEDLE 31G X 5 MM MISC: 1.0000 | Freq: Every day | 3 refills | Status: DC

## 2017-06-23 ENCOUNTER — Telehealth: Payer: Self-pay | Admitting: Family Medicine

## 2017-06-23 NOTE — Telephone Encounter (Signed)
Patient came into clinic with C/O of his glucometer readings are increased from normal and he thought his glucometer might be wrong stated fasting sugars in the morning have been around 200 to 220, Check patient cbg with in house glucometer reading was 120 , had patient check with his on glucometer and reading was 118. Enquired as to patient night time snack which he staed was fresh fruit, and cookies for break fast , advised patient that the cookies with fruit might be causing the increased sugars patient staed he did not think so, advised patient to make an appointment to discuss with PCP . Patient scheduled for 07/04/17. FYI

## 2017-07-03 ENCOUNTER — Telehealth: Payer: Self-pay | Admitting: Family Medicine

## 2017-07-03 NOTE — Telephone Encounter (Signed)
Pt came in requesting new rx's for Insulin Glargine (LANTUS) 100 UNIT/ML Solostar Pen, lisinopril (PRINIVIL,ZESTRIL) 20 MG tablet, metFORMIN (GLUCOPHAGE) 1000 MG tablet, and glimepiride (AMARYL) 4 MG tablet. Please advise, thank you!  [place], Jasper Grimes

## 2017-07-04 ENCOUNTER — Ambulatory Visit (INDEPENDENT_AMBULATORY_CARE_PROVIDER_SITE_OTHER): Payer: Medicare HMO | Admitting: Family Medicine

## 2017-07-04 ENCOUNTER — Encounter: Payer: Self-pay | Admitting: Family Medicine

## 2017-07-04 VITALS — BP 130/70 | HR 79 | Temp 98.7°F | Wt 179.5 lb

## 2017-07-04 DIAGNOSIS — E1142 Type 2 diabetes mellitus with diabetic polyneuropathy: Secondary | ICD-10-CM

## 2017-07-04 DIAGNOSIS — Z130 Encounter for screening for diseases of the blood and blood-forming organs and certain disorders involving the immune mechanism: Secondary | ICD-10-CM

## 2017-07-04 DIAGNOSIS — I10 Essential (primary) hypertension: Secondary | ICD-10-CM | POA: Diagnosis not present

## 2017-07-04 DIAGNOSIS — E1169 Type 2 diabetes mellitus with other specified complication: Secondary | ICD-10-CM | POA: Diagnosis not present

## 2017-07-04 DIAGNOSIS — E785 Hyperlipidemia, unspecified: Secondary | ICD-10-CM | POA: Diagnosis not present

## 2017-07-04 LAB — CBC
HEMATOCRIT: 41.3 % (ref 39.0–52.0)
HEMOGLOBIN: 14.1 g/dL (ref 13.0–17.0)
MCHC: 34.1 g/dL (ref 30.0–36.0)
MCV: 99 fl (ref 78.0–100.0)
PLATELETS: 155 10*3/uL (ref 150.0–400.0)
RBC: 4.17 Mil/uL — ABNORMAL LOW (ref 4.22–5.81)
RDW: 13.5 % (ref 11.5–15.5)
WBC: 7.9 10*3/uL (ref 4.0–10.5)

## 2017-07-04 LAB — LIPID PANEL
Cholesterol: 103 mg/dL (ref 0–200)
HDL: 18.5 mg/dL — ABNORMAL LOW (ref >39.00)
Total CHOL/HDL Ratio: 6
Triglycerides: 410 mg/dL — ABNORMAL HIGH (ref 0.0–149.0)

## 2017-07-04 LAB — COMPREHENSIVE METABOLIC PANEL
ALT: 9 U/L (ref 0–53)
AST: 12 U/L (ref 0–37)
Albumin: 4.3 g/dL (ref 3.5–5.2)
Alkaline Phosphatase: 48 U/L (ref 39–117)
BILIRUBIN TOTAL: 0.5 mg/dL (ref 0.2–1.2)
BUN: 13 mg/dL (ref 6–23)
CALCIUM: 9.3 mg/dL (ref 8.4–10.5)
CO2: 24 meq/L (ref 19–32)
CREATININE: 1.2 mg/dL (ref 0.40–1.50)
Chloride: 102 mEq/L (ref 96–112)
GFR: 64.09 mL/min (ref >60.00)
Glucose, Bld: 214 mg/dL — ABNORMAL HIGH (ref 70–99)
Potassium: 3.9 mEq/L (ref 3.5–5.1)
SODIUM: 135 meq/L (ref 135–145)
Total Protein: 7.1 g/dL (ref 6.0–8.3)

## 2017-07-04 LAB — HEMOGLOBIN A1C: Hgb A1c MFr Bld: 6.1 % (ref 4.6–6.5)

## 2017-07-04 MED ORDER — INSULIN GLARGINE 100 UNIT/ML SOLOSTAR PEN: 30.0000 [IU] | PEN_INJECTOR | Freq: Every day | SUBCUTANEOUS | 11 refills | Status: DC

## 2017-07-04 MED ORDER — ASPIRIN EC 81 MG PO TBEC: 81.0000 mg | DELAYED_RELEASE_TABLET | Freq: Every day | ORAL | Status: DC

## 2017-07-04 MED ORDER — METFORMIN HCL 1000 MG PO TABS: 1000.0000 mg | ORAL_TABLET | Freq: Two times a day (BID) | ORAL | 3 refills | Status: DC

## 2017-07-04 NOTE — Assessment & Plan Note (Signed)
At goal. Continue lisinopril and HCTZ.

## 2017-07-04 NOTE — Patient Instructions (Signed)
Stop the amaryl.  Continue your other meds.  We will call with your lab results.  Follow up in 6 months.   Take care  Dr. Lacinda Axon

## 2017-07-04 NOTE — Assessment & Plan Note (Signed)
Stable. Continue Lantus, metformin. Stopping Amaryl.

## 2017-07-04 NOTE — Assessment & Plan Note (Signed)
LDL at goal. Continue simvastatin.

## 2017-07-04 NOTE — Telephone Encounter (Signed)
Scripts sent in at office visit 07/04/17

## 2017-07-04 NOTE — Progress Notes (Signed)
Subjective:  Patient ID: Richard Gordon, male    DOB: 1951/07/21  Age: 66 y.o. MRN: [id number]  CC: Follow up  HPI:  66 year old male with HTN, HLD, DM-2 hx of CVA presents for follow up.  DM-2  Intermittently elevated fasting sugars. Was 206 this morning.  Endorses compliance with Amaryl, metformin, and Lantus 30 units daily at bedtime.  No hypoglycemia.  Hypertension  At goal on HCTZ and lisinopril.  Hyperlipidemia  LDL at goal on Simvastatin.  Social Hx   Social History   Social History  . Marital status: Single    Spouse name: N/A  . Number of children: N/A  . Years of education: N/A   Social History Main Topics  . Smoking status: Former Research scientist (life sciences)  . Smokeless tobacco: Never Used  . Alcohol use No  . Drug use: No  . Sexual activity: Not Currently    Partners: Female   Other Topics Concern  . None   Social History Narrative   ** Merged History Encounter **        Review of Systems  Constitutional: Negative.   Endocrine: Negative.    Objective:  BP 130/70 (BP Location: Left Arm, Patient Position: Sitting, Cuff Size: Normal)   Pulse 79   Temp 98.7 F (37.1 C) (Oral)   Wt 179 lb 8 oz (81.4 kg)   SpO2 95%   BMI 25.76 kg/m   BP/Weight 07/04/2017 02/20/2017 40/98/1191  Systolic BP 478 295 621  Diastolic BP 70 82 69  Wt. (Lbs) 179.5 186.5 184.5  BMI 25.76 26.76 26.47    Physical Exam  Constitutional: He is oriented to person, place, and time. He appears well-developed. No distress.  HENT:  Head: Normocephalic.  Eyes: Conjunctivae are normal. No scleral icterus.  Cardiovascular: Normal rate and regular rhythm.   No murmur heard. Pulmonary/Chest: Effort normal. He has no wheezes. He has no rales.  Neurological: He is alert and oriented to person, place, and time.  Psychiatric: He has a normal mood and affect.  Vitals reviewed.   Lab Results  Component Value Date   WBC 4.4 02/14/2016   HGB 12.9 (L) 02/14/2016   HCT 37.0 (L) 02/14/2016   PLT 177 02/14/2016   GLUCOSE 99 08/15/2016   CHOL 109 (L) 08/15/2016   TRIG 251 (H) 08/15/2016   HDL 19 (L) 08/15/2016   LDLCALC 40 08/15/2016   ALT 27 07/10/2013   AST 14 (L) 07/10/2013   NA 139 08/15/2016   K 4.3 08/15/2016   CL 104 08/15/2016   CREATININE 1.19 08/15/2016   BUN 14 08/15/2016   CO2 26 08/15/2016   HGBA1C 6.3 11/21/2016   MICROALBUR 20 03/03/2016    Assessment & Plan:   Problem List Items Addressed This Visit      Cardiovascular and Mediastinum   Hypertension    At goal. Continue lisinopril and HCTZ.      Relevant Medications   aspirin EC 81 MG tablet     Endocrine   DM type 2 with diabetic peripheral neuropathy ([name]) - Primary    Stable. Continue Lantus, metformin. Stopping Amaryl.      Relevant Medications   Insulin Glargine (LANTUS) 100 UNIT/ML Solostar Pen   metFORMIN (GLUCOPHAGE) 1000 MG tablet   aspirin EC 81 MG tablet   Other Relevant Orders   Hemoglobin A1c   Comprehensive metabolic panel   Hyperlipidemia associated with type 2 diabetes mellitus ([name])    LDL at goal. Continue simvastatin.  Relevant Medications   Insulin Glargine (LANTUS) 100 UNIT/ML Solostar Pen   metFORMIN (GLUCOPHAGE) 1000 MG tablet   aspirin EC 81 MG tablet   Other Relevant Orders   Lipid panel    Other Visit Diagnoses    Screening for deficiency anemia       Relevant Orders   CBC      Meds ordered this encounter  Medications  . Insulin Glargine (LANTUS) 100 UNIT/ML Solostar Pen    Sig: Inject 30 Units into the skin daily.    Dispense:  15 mL    Refill:  11  . metFORMIN (GLUCOPHAGE) 1000 MG tablet    Sig: Take 1 tablet (1,000 mg total) by mouth 2 (two) times daily.    Dispense:  180 tablet    Refill:  3  . aspirin EC 81 MG tablet    Sig: Take 1 tablet (81 mg total) by mouth daily.    Follow-up: 6 months.  [place]

## 2017-07-05 LAB — LDL CHOLESTEROL, DIRECT: LDL DIRECT: 34 mg/dL

## 2017-07-06 ENCOUNTER — Telehealth: Payer: Self-pay | Admitting: Family Medicine

## 2017-07-06 NOTE — Telephone Encounter (Signed)
Pt saw Dr. Lacinda Axon on 07/04/17. Pt stated that Dr. Lacinda Axon might be able to help him with his insulin. He is requesting help. Phone number is [phone].

## 2017-07-06 NOTE — Telephone Encounter (Signed)
Left voice mail to call back

## 2017-07-07 NOTE — Telephone Encounter (Addendum)
I have been unable to reach him multiple times regarding lab results . Tried calling again today again and left a message today to find more information regarding insulin to see what he was referring to .  Do you know what he may be referring to .

## 2017-07-10 ENCOUNTER — Telehealth: Payer: Self-pay | Admitting: Family Medicine

## 2017-07-10 DIAGNOSIS — I10 Essential (primary) hypertension: Secondary | ICD-10-CM

## 2017-07-10 MED ORDER — LISINOPRIL 20 MG PO TABS: 20.0000 mg | ORAL_TABLET | Freq: Every day | ORAL | 1 refills | Status: DC

## 2017-07-10 MED ORDER — SIMVASTATIN 20 MG PO TABS: 10.0000 mg | ORAL_TABLET | Freq: Every day | ORAL | 1 refills | Status: DC

## 2017-07-10 MED ORDER — HYDROCHLOROTHIAZIDE 12.5 MG PO TABS: 12.5000 mg | ORAL_TABLET | Freq: Every day | ORAL | 1 refills | Status: DC

## 2017-07-10 NOTE — Telephone Encounter (Signed)
Pt is requesting that the following medications be refilled through his mail order pharmacy; Educational psychologist.   lisinopril (PRINIVIL,ZESTRIL) 20 MG tablet  simvastatin (ZOCOR) 20 MG tablet  hydrochlorothiazide (HYDRODIURIL) 12.5 MG tablet

## 2017-07-10 NOTE — Telephone Encounter (Signed)
Script sent

## 2017-07-17 ENCOUNTER — Ambulatory Visit (INDEPENDENT_AMBULATORY_CARE_PROVIDER_SITE_OTHER): Payer: Medicare HMO | Admitting: Pharmacist

## 2017-07-17 DIAGNOSIS — E1142 Type 2 diabetes mellitus with diabetic polyneuropathy: Secondary | ICD-10-CM

## 2017-07-17 MED ORDER — INSULIN GLARGINE 100 UNIT/ML SOLOSTAR PEN: 40.0000 [IU] | PEN_INJECTOR | Freq: Every day | SUBCUTANEOUS | 11 refills | Status: DC

## 2017-07-17 NOTE — Progress Notes (Signed)
Patient presents today with question regarding medication supply shipped from [place].   Patient states that he recently changed insurance carriers to [place]. He gets his medications mailed to him. He reports that he used to get 2 boxes of 5 Lantus Solostar pens. Now, he is only getting 1 box of 5 pens.   Explained to patient that at his current dose, 5 pens (1 box) would last him ~37 days. 1 box is appropriate for a one month supply. I called [employer] on the patient's behalf, their current Lantus rx indicated that 1 box of 5 pens would last 50 days. Updated Rx sent to [facility].   Patient inquires if there is a cheaper insulin on his plan. After reviewing [employer]'s formulary, the lowest tier any insulins are is Tier 3, which Lantus is.   Patient verbalized understanding.   [name], Pharm.D. PGY2 Ambulatory Care Pharmacy Resident Phone: [phone]

## 2017-07-17 NOTE — Progress Notes (Deleted)
S:     Chief Complaint  Patient presents with  . Medication Management    diabetes   T2, HTN, HLD, CKD3, d/c'd glimep A1C 6.1 on 7/31, SCr 1.2 ASA 81, simva 20, fish oil 1200 mg *** CrCl ~ 69 ml/min  Thinking SGLT2 if needed   Patient arrives in good spirits and ambulating without assistance.  Presents for diabetes evaluation, education, and management at the request of Dr. Lacinda Axon. Patient was referred on 07/04/2017.  Patient was last seen by Primary Care Provider on 07/04/2017.   Patient reports Diabetes was diagnosed in ***.   Family/Social History:   Patient reports adherence with medications.  Current diabetes medications include: Metformin 100 mg BID, lantus 39 units QHS Current hypertension medications include: lisinopril 20 mg daily, HCTZ 12.5 mg daily  Patient denies hypoglycemic events.  Patient reported dietary habits: Eats 2.5-3 meals/day Breakfast: burrito (egg, sausage) from [employer] Lunch: pimento cheese sandwich  Dinner: snacks on fruits  Snacks: strawberries, blueberries Drinks: coffee with sugar and cream, green tea 1.5 servings a d  Patient reported exercise habits: walk 1 mile every morning    Patient denies nocturia. Stable.  Patient reports neuropathy. Patient reports visual changes. Going to eye doctor soon.  Patient reports self foot exams. No issues  .[email or web address]   O:  Physical Exam  Constitutional: He appears well-developed and well-nourished.   Review of Systems  All other systems reviewed and are negative.  Lab Results  Component Value Date   HGBA1C 6.1 07/04/2017   There were no vitals filed for this visit.  Home fasting CBG: 175 this morning, usually 150-200 mg/dL 2 hour post-prandial/random CBG: not checking  10 year ASCVD risk: ***.  A/P: Diabetes longstanding/newly diagnosed currently ***. Patient {Actions; denies-reports:120008} hypoglycemic events and is able to verbalize appropriate hypoglycemia management plan.  Patient {Actions; denies-reports:120008} adherence with medication. Control is suboptimal due to ***.  PICK 1 OF THE FOLLOWING 4 (DELETE the others AND THEN DELETE THIS LINE OF TEXT)  {Meds adjust:18428} basal insulin Lantus (insulin glargine). Patient will continue to titrate 1 unit/day if fasting CBGs > 163m/dl until fasting CBGs reach goal or next visit.    {Meds adjust:18428} basal insulin Lantus (insulin glargine) to ***. {Meds adjust:18428} rapid insulin Novolog (insulin aspart) to ***.   {Meds adjust:18428} Victoza (liraglutide) to ***.   {Meds aGEXBMW:41324}Trulicity (dulaglutide) to ***.   {Meds adjust:18428} Jardiance (empagliflozin) to ***.  Next A1C anticipated ***.    ASCVD risk greater than 7.5%. {Meds adjust:18428} Aspirin *** mg and {Meds adjust:18428} ***statin *** mg.   Hypertension longstanding/newly diagnosed currently ***.  Patient {Actions; denies-reports:120008} adherence with medication. Control is suboptimal due to ***.  Written patient instructions provided.  Total time in face to face counseling *** minutes.   Follow up in Pharmacist Clinic Visit ***.   Patient seen with ***

## 2017-07-24 DIAGNOSIS — E113393 Type 2 diabetes mellitus with moderate nonproliferative diabetic retinopathy without macular edema, bilateral: Secondary | ICD-10-CM | POA: Diagnosis not present

## 2017-07-24 LAB — HM DIABETES EYE EXAM

## 2017-08-08 DIAGNOSIS — Z0100 Encounter for examination of eyes and vision without abnormal findings: Secondary | ICD-10-CM | POA: Diagnosis not present

## 2017-08-10 ENCOUNTER — Telehealth: Payer: Self-pay | Admitting: Family Medicine

## 2017-08-10 DIAGNOSIS — E1142 Type 2 diabetes mellitus with diabetic polyneuropathy: Secondary | ICD-10-CM

## 2017-08-10 NOTE — Telephone Encounter (Signed)
Pt is requesting a refill for BD Ultra-Fine Pen Needles, mini 5m x 31G. Please advise.   Thanks

## 2017-08-11 MED ORDER — INSULIN PEN NEEDLE 31G X 5 MM MISC: 1.0000 | Freq: Every day | 3 refills | Status: DC

## 2017-08-11 NOTE — Telephone Encounter (Signed)
Script sent to [facility] patient advised

## 2017-08-24 ENCOUNTER — Ambulatory Visit: Payer: Self-pay | Admitting: Family Medicine

## 2017-08-29 DIAGNOSIS — R69 Illness, unspecified: Secondary | ICD-10-CM | POA: Diagnosis not present

## 2017-09-01 ENCOUNTER — Telehealth: Payer: Self-pay | Admitting: Family Medicine

## 2017-09-01 MED ORDER — BLOOD GLUCOSE MONITOR KIT: PACK | 0 refills | Status: DC

## 2017-09-01 NOTE — Telephone Encounter (Signed)
Please advise

## 2017-09-01 NOTE — Telephone Encounter (Signed)
Pt is wondering if he qualifies for a new Accu-chek meter. He has had his for 3years and thinks it is not giving out correct results. Please call him at [phone].

## 2017-09-01 NOTE — Telephone Encounter (Signed)
We can send in a new one and see if they will cover it. Please find out where he wants it faxed.

## 2017-09-01 NOTE — Telephone Encounter (Signed)
Faxed to [facility], left message to notify

## 2017-09-13 ENCOUNTER — Other Ambulatory Visit: Payer: Self-pay

## 2017-09-13 MED ORDER — BLOOD GLUCOSE MONITOR KIT: PACK | 0 refills | Status: DC

## 2017-09-13 NOTE — Progress Notes (Signed)
Patient walked in and states he would like a new rx for a meter to take to his pharmacy, printed rx

## 2017-10-01 ENCOUNTER — Telehealth: Payer: Self-pay | Admitting: Family Medicine

## 2017-10-01 NOTE — Telephone Encounter (Signed)
I received a letter from the patient's insurance that they would not cover his Lantus. Please see if he is willing to switch to basaglar which is on his formulary and would be an equivalent dose and same kind of insulin as Lantus. Thanks.

## 2017-10-02 NOTE — Telephone Encounter (Signed)
Patient states he is ok with switching

## 2017-10-03 MED ORDER — BASAGLAR KWIKPEN 100 UNIT/ML ~~LOC~~ SOPN: 40.0000 [IU] | PEN_INJECTOR | Freq: Every day | SUBCUTANEOUS | 11 refills | Status: DC

## 2017-10-03 NOTE — Addendum Note (Signed)
Addended by: Leone Haven on: 10/03/2017 06:18 PM   Modules accepted: Orders

## 2017-10-03 NOTE — Telephone Encounter (Addendum)
Basaglar sent to pharmacy. Please confirm patients current dose of lantus. It appears that at some point since July he was increased to 40 u daily. Please confirm this dose with him. Thanks.

## 2017-10-04 NOTE — Telephone Encounter (Signed)
Left message to return call

## 2017-10-05 NOTE — Telephone Encounter (Signed)
Left message to return call

## 2017-10-10 NOTE — Telephone Encounter (Signed)
Noted.  Patient should continue on 42 units daily.

## 2017-10-10 NOTE — Telephone Encounter (Signed)
Patient is taking 42 units daily, he has not received the basaglar, patient will check with [place]

## 2017-10-16 ENCOUNTER — Telehealth: Payer: Self-pay | Admitting: Pharmacist

## 2017-10-16 MED ORDER — INSULIN GLARGINE 100 UNIT/ML SOLOSTAR PEN: 42.0000 [IU] | PEN_INJECTOR | Freq: Every day | SUBCUTANEOUS | 3 refills | Status: DC

## 2017-10-16 NOTE — Telephone Encounter (Signed)
Please advise pt he came in wanting to know the cost

## 2017-10-16 NOTE — Addendum Note (Signed)
Addended by: Deirdre Pippins E on: 10/16/2017 10:34 AM   Modules accepted: Orders

## 2017-10-16 NOTE — Telephone Encounter (Signed)
Called [employer] home delivery to sort out formulary confusion. Dr. Caryl Bis rec'd notification that Lantus was no longer preferred, however when he sent in Rx for Basaglar to replace it, insurance sent back fax stating Basaglar needed PA and preferred insulins were Lantus or Toujeo. Per Dr. Caryl Bis, [place] to change insulin to what is preferred on insurance.   Called in new Rx for Lantus Solostar 100U/mL 42 units once daily #QS 90 day supply with 3RF. Estimated cost is $121 for 90 day supply. Called pt to inform him of change and price however no answer, left HIPAA-compliant VM requesting he return my call. If he would rather a 30 day supply I will need to call in new Rx.   [name], Pharm.D. PGY2 Ambulatory Care Pharmacy Resident Phone: [phone]

## 2017-10-17 NOTE — Telephone Encounter (Signed)
Hi Caryl Pina - See my note from earlier yesterday - The estimated cost is $121 for a 90 day supply. I tried calling him but no answer. Left HIPAA compliant VM. Would you mind reaching out to him or just relay my findings below? Thanks!

## 2017-10-17 NOTE — Telephone Encounter (Signed)
Ok if he comes in again I will let him know

## 2017-10-17 NOTE — Telephone Encounter (Signed)
$  42 may be for 30 day supply? If he thinks $121 is the incorrect cost he would need to contact his insurance company directly. I called him for a third time to try and touch base but no luck.

## 2017-10-17 NOTE — Telephone Encounter (Signed)
I read the note to him yesterday when he came in and he stated that I was wrong and that it should only cost him $42

## 2017-10-30 ENCOUNTER — Ambulatory Visit (INDEPENDENT_AMBULATORY_CARE_PROVIDER_SITE_OTHER): Payer: Medicare HMO | Admitting: Pharmacist

## 2017-10-30 DIAGNOSIS — E1142 Type 2 diabetes mellitus with diabetic polyneuropathy: Secondary | ICD-10-CM

## 2017-10-30 NOTE — Progress Notes (Signed)
Patient presents today with questions regarding insurance coverage and formulary management. Patient inquires which insulin would be most affordable on [employer] Part D plan. All insulins are Tier 3 per my formulary search. Lantus is just as affordable as levemir and [place]. Basaglar is not covered at this time.   Patient expressed thanks for the visit and denies further questions.   [name], Pharm.D., BCPS PGY2 Ambulatory Care Pharmacy Resident Phone: [phone]

## 2017-11-06 ENCOUNTER — Telehealth: Payer: Self-pay | Admitting: Family Medicine

## 2017-11-06 DIAGNOSIS — R69 Illness, unspecified: Secondary | ICD-10-CM | POA: Diagnosis not present

## 2017-11-06 MED ORDER — GLUCOSE BLOOD VI STRP: ORAL_STRIP | 12 refills | Status: DC

## 2017-11-06 NOTE — Telephone Encounter (Signed)
Pt would like to have a prescription for test stripes for his OneTouch Verio moitor. Pt uses [facility] on [address]

## 2017-11-06 NOTE — Telephone Encounter (Signed)
Sent to pharmacy

## 2017-12-08 ENCOUNTER — Other Ambulatory Visit: Payer: Self-pay

## 2017-12-08 ENCOUNTER — Encounter: Payer: Self-pay | Admitting: Family Medicine

## 2017-12-08 ENCOUNTER — Ambulatory Visit (INDEPENDENT_AMBULATORY_CARE_PROVIDER_SITE_OTHER): Payer: Medicare HMO | Admitting: Family Medicine

## 2017-12-08 VITALS — BP 116/60 | HR 77 | Temp 97.9°F | Wt 180.0 lb

## 2017-12-08 DIAGNOSIS — I6930 Unspecified sequelae of cerebral infarction: Secondary | ICD-10-CM

## 2017-12-08 DIAGNOSIS — E785 Hyperlipidemia, unspecified: Secondary | ICD-10-CM | POA: Diagnosis not present

## 2017-12-08 DIAGNOSIS — E1142 Type 2 diabetes mellitus with diabetic polyneuropathy: Secondary | ICD-10-CM

## 2017-12-08 DIAGNOSIS — E1169 Type 2 diabetes mellitus with other specified complication: Secondary | ICD-10-CM

## 2017-12-08 DIAGNOSIS — I10 Essential (primary) hypertension: Secondary | ICD-10-CM | POA: Diagnosis not present

## 2017-12-08 NOTE — Progress Notes (Signed)
Tommi Rumps, MD Phone: [phone]  Richard Gordon is a 67 y.o. male who presents today for follow-up.  Hypertension: Not checking.  Taking HCTZ, lisinopril.  No chest pain or shortness of breath.  Diabetes: Running between 93 and 190.  Taking Lantus 42 units daily.  Taking metformin.  No polyuria or polydipsia.  No hypoglycemia.  Reports he saw ophthalmology 4 months ago.  Patient has a history of stroke at age 59.  Left him with some residual numbness in his left hand and elbow as well as his left foot.  Notices it more in the winter.  Notes no weakness.  Occasional muscular tightness in his left arm.  No chest pain.  Feels the bottom of his foot is swollen at times though no swelling noted.  No acute changes.  Does take simvastatin and aspirin.  Social History   Tobacco Use  Smoking Status Former Smoker  Smokeless Tobacco Never Used     ROS see history of present illness  Objective  Physical Exam Vitals:   12/08/17 1356  BP: 116/60  Pulse: 77  Temp: 97.9 F (36.6 C)  SpO2: 98%    BP Readings from Last 3 Encounters:  12/08/17 116/60  07/04/17 130/70  02/20/17 124/82   Wt Readings from Last 3 Encounters:  12/08/17 180 lb (81.6 kg)  07/17/17 177 lb 6.4 oz (80.5 kg)  07/04/17 179 lb 8 oz (81.4 kg)    Physical Exam  Constitutional: No distress.  Cardiovascular: Normal rate, regular rhythm and normal heart sounds.  Pulmonary/Chest: Effort normal and breath sounds normal.  Musculoskeletal: He exhibits no edema.  No tenderness of bilateral soles of feet  Neurological: He is alert. Gait normal.  CN 2-12 intact, 5/5 strength in bilateral biceps, triceps, grip, quads, hamstrings, plantar and dorsiflexion, sensation to light touch intact in bilateral UE and LE, normal gait  Skin: Skin is warm and dry. He is not diaphoretic.     Assessment/Plan: Please see individual problem list.  Hypertension Well-controlled.  He will return next week for fasting lab  work.  DM type 2 with diabetic peripheral neuropathy Appears to be relatively well controlled.  Return next week for A1c.  Will check into medication assistance for his Lantus.  Hyperlipidemia associated with type 2 diabetes mellitus ([place]) Return for fasting lipid panel.  Previously triglycerides were elevated.  Personal history of stroke with residual effects Continue secondary prevention.  Prior symptoms are stable.  Does note some issues with feeling off balance that has been chronic.  No acute changes.  He is interested in physical therapy to help with this.  Referral placed.   [name] was seen today for establish care.  Diagnoses and all orders for this visit:  Hyperlipidemia associated with type 2 diabetes mellitus ([place]) -     Comp Met (CMET); Future -     Lipid panel; Future  DM type 2 with diabetic peripheral neuropathy ([name]) -     Hemoglobin A1c; Future  Essential hypertension  Personal history of stroke with residual effects -     Ambulatory referral to Physical Therapy    Orders Placed This Encounter  Procedures  . Comp Met (CMET)    Standing Status:   Future    Standing Expiration Date:   12/08/2018  . Lipid panel    Standing Status:   Future    Standing Expiration Date:   12/08/2018  . Hemoglobin A1c    Standing Status:   Future    Standing Expiration  Date:   12/08/2018  . Ambulatory referral to Physical Therapy    Referral Priority:   Routine    Referral Type:   Physical Medicine    Referral Reason:   Specialty Services Required    Requested Specialty:   Physical Therapy    Number of Visits Requested:   1    No orders of the defined types were placed in this encounter.    Tommi Rumps, MD [place]

## 2017-12-08 NOTE — Assessment & Plan Note (Signed)
Appears to be relatively well controlled.  Return next week for A1c.  Will check into medication assistance for his Lantus.

## 2017-12-08 NOTE — Assessment & Plan Note (Signed)
Well-controlled.  He will return next week for fasting lab work.

## 2017-12-08 NOTE — Progress Notes (Signed)
Unfortunately, all of the patient assistance programs that offer insulins ([employer], [employer], and [employer]) require some sort of out of pocket spend threshold to be met, be it $1000-$1100 or 5% of annual income, during this calendar year. Last year [employer] waived out of pocket spend requirements for some of my patients later in the year - we can try this avenue but we would need to switch him to [employer]. I will call him to see if he meets the income requirements and go from there.  [name], Pharm.D., BCPS PGY2 Ambulatory Care Pharmacy Resident Phone: [phone]

## 2017-12-08 NOTE — Assessment & Plan Note (Signed)
Return for fasting lipid panel.  Previously triglycerides were elevated.

## 2017-12-08 NOTE — Patient Instructions (Signed)
Nice to meet you. We will check fasting labs next week.  If you develop new numbness or weakness please be evaluated.

## 2017-12-08 NOTE — Assessment & Plan Note (Signed)
Continue secondary prevention.  Prior symptoms are stable.  Does note some issues with feeling off balance that has been chronic.  No acute changes.  He is interested in physical therapy to help with this.  Referral placed.

## 2017-12-11 ENCOUNTER — Other Ambulatory Visit (INDEPENDENT_AMBULATORY_CARE_PROVIDER_SITE_OTHER): Payer: Medicare HMO

## 2017-12-11 DIAGNOSIS — E1169 Type 2 diabetes mellitus with other specified complication: Secondary | ICD-10-CM

## 2017-12-11 DIAGNOSIS — E1142 Type 2 diabetes mellitus with diabetic polyneuropathy: Secondary | ICD-10-CM | POA: Diagnosis not present

## 2017-12-11 DIAGNOSIS — E785 Hyperlipidemia, unspecified: Secondary | ICD-10-CM

## 2017-12-11 LAB — COMPREHENSIVE METABOLIC PANEL
ALK PHOS: 58 U/L (ref 39–117)
ALT: 10 U/L (ref 0–53)
AST: 13 U/L (ref 0–37)
Albumin: 4.4 g/dL (ref 3.5–5.2)
BUN: 11 mg/dL (ref 6–23)
CALCIUM: 9.4 mg/dL (ref 8.4–10.5)
CO2: 29 meq/L (ref 19–32)
CREATININE: 1.18 mg/dL (ref 0.40–1.50)
Chloride: 102 mEq/L (ref 96–112)
GFR: 65.26 mL/min (ref >60.00)
Glucose, Bld: 139 mg/dL — ABNORMAL HIGH (ref 70–99)
Potassium: 4.6 mEq/L (ref 3.5–5.1)
Sodium: 137 mEq/L (ref 135–145)
TOTAL PROTEIN: 7.3 g/dL (ref 6.0–8.3)
Total Bilirubin: 0.6 mg/dL (ref 0.2–1.2)

## 2017-12-11 LAB — LIPID PANEL
CHOL/HDL RATIO: 4
CHOLESTEROL: 103 mg/dL (ref 0–200)
HDL: 23.4 mg/dL — ABNORMAL LOW (ref >39.00)
NonHDL: 80.01
TRIGLYCERIDES: 307 mg/dL — AB (ref 0.0–149.0)
VLDL: 61.4 mg/dL — ABNORMAL HIGH (ref 0.0–40.0)

## 2017-12-11 LAB — HEMOGLOBIN A1C: Hgb A1c MFr Bld: 8.2 % — ABNORMAL HIGH (ref 4.6–6.5)

## 2017-12-11 LAB — LDL CHOLESTEROL, DIRECT: Direct LDL: 31 mg/dL

## 2017-12-14 ENCOUNTER — Ambulatory Visit: Payer: Self-pay

## 2017-12-18 ENCOUNTER — Ambulatory Visit: Payer: Medicare HMO | Admitting: Physical Therapy

## 2017-12-19 ENCOUNTER — Other Ambulatory Visit: Payer: Self-pay | Admitting: Family Medicine

## 2017-12-19 ENCOUNTER — Other Ambulatory Visit: Payer: Self-pay | Admitting: Pharmacist

## 2017-12-19 DIAGNOSIS — E782 Mixed hyperlipidemia: Secondary | ICD-10-CM

## 2017-12-19 MED ORDER — OMEGA-3-ACID ETHYL ESTERS 1 G PO CAPS
2.0000 g | ORAL_CAPSULE | Freq: Two times a day (BID) | ORAL | 1 refills | Status: DC
Start: 2017-12-19 — End: 2018-03-09

## 2017-12-19 MED ORDER — EMPAGLIFLOZIN 10 MG PO TABS: 10.0000 mg | ORAL_TABLET | Freq: Every day | ORAL | 3 refills | Status: DC

## 2017-12-19 MED ORDER — SIMVASTATIN 5 MG PO TABS: 5.0000 mg | ORAL_TABLET | Freq: Every day | ORAL | 3 refills | Status: DC

## 2017-12-19 NOTE — Telephone Encounter (Signed)
Called patient to follow up on insulin cost concerns. Patient states that he got another letter from [place] stating that Lantus is no longer covered. Per my search, Lantus NON formulary now. Covered alternatives include basaglar, levemir, tresiba.   Pended prescription for 1:1 conversion from lantus 42 units daily to basaglar 42 units daily. Confirmed dose with patient.   Discussed this with patient, will send prescription to Dr. Caryl Bis for review.   [name], Pharm.D., BCPS PGY2 Ambulatory Care Pharmacy Resident Phone: [phone]

## 2017-12-20 ENCOUNTER — Telehealth (INDEPENDENT_AMBULATORY_CARE_PROVIDER_SITE_OTHER): Payer: Medicare HMO | Admitting: Family Medicine

## 2017-12-20 ENCOUNTER — Other Ambulatory Visit (INDEPENDENT_AMBULATORY_CARE_PROVIDER_SITE_OTHER): Payer: Medicare HMO

## 2017-12-20 ENCOUNTER — Ambulatory Visit: Payer: Medicare HMO | Admitting: Physical Therapy

## 2017-12-20 DIAGNOSIS — R739 Hyperglycemia, unspecified: Secondary | ICD-10-CM | POA: Diagnosis not present

## 2017-12-20 DIAGNOSIS — E1065 Type 1 diabetes mellitus with hyperglycemia: Secondary | ICD-10-CM

## 2017-12-20 DIAGNOSIS — E1165 Type 2 diabetes mellitus with hyperglycemia: Secondary | ICD-10-CM | POA: Diagnosis not present

## 2017-12-20 LAB — BASIC METABOLIC PANEL
BUN: 15 mg/dL (ref 6–23)
CO2: 25 mEq/L (ref 19–32)
Calcium: 9.5 mg/dL (ref 8.4–10.5)
Chloride: 101 mEq/L (ref 96–112)
Creatinine, Ser: 1.08 mg/dL (ref 0.40–1.50)
GFR: 72.28 mL/min (ref >60.00)
Glucose, Bld: 282 mg/dL — ABNORMAL HIGH (ref 70–99)
POTASSIUM: 4.1 meq/L (ref 3.5–5.1)
SODIUM: 135 meq/L (ref 135–145)

## 2017-12-20 LAB — GLUCOSE, POCT (MANUAL RESULT ENTRY): POC GLUCOSE: 254 mg/dL — AB (ref 70–99)

## 2017-12-20 NOTE — Telephone Encounter (Signed)
Patient came into clinic this morning with concern for fasting CBG of 385. Patient stated he took his metformin 1000 mg an lantus 20 units and waited about 15 minutes retook his CBG finger stick 335. Patient stated he continued to wait another 20 minutes and retook his CBG still fasting 415, which when he became concerned and came to the office. Patient was brought back for triage and vitals attained along with POCT CBG which was 254, Pulse 83 02 sat @ 97% and BP 160/78. Patient was advised CBG was down from home reading and was allowed to rest for another 15 minutes new BP attained of 150/74 pulse 74. Reported findings to PCP along with patient meal details for the previous evening of only boiled shrimp. Nurse advised by PCP to attain a BMET to confirm CBG and to retake BP. BP was taken 15 minutes later and was recorded at 138/76 pulse 70 and patient was escorted to lab, and orders placed. Patient denied any dizziness, sob, nausea , skin warm and dry to touch. Patient stated he feels fine and normal.

## 2017-12-21 MED ORDER — BASAGLAR KWIKPEN 100 UNIT/ML ~~LOC~~ SOPN: 42.0000 [IU] | PEN_INJECTOR | Freq: Every day | SUBCUTANEOUS | 3 refills | Status: DC

## 2017-12-21 NOTE — Telephone Encounter (Signed)
Reviewed.  Discussed with LPN yesterday.  Patient was asymptomatic.  She advised that he had not picked up and started Jardiance yet.  He should start this.  He should continue to monitor his CBGs.  If they do not come down with addition of this medication he needs to let us know.  He can keep his plan follow-up.

## 2017-12-21 NOTE — Telephone Encounter (Signed)
Noted.  Prescription signed.

## 2017-12-25 ENCOUNTER — Ambulatory Visit: Payer: Medicare HMO | Admitting: Physical Therapy

## 2017-12-27 ENCOUNTER — Ambulatory Visit: Payer: Medicare HMO | Admitting: Physical Therapy

## 2018-01-01 ENCOUNTER — Ambulatory Visit: Payer: Medicare HMO | Admitting: Physical Therapy

## 2018-01-03 ENCOUNTER — Ambulatory Visit: Payer: Medicare HMO | Admitting: Physical Therapy

## 2018-01-08 ENCOUNTER — Other Ambulatory Visit: Payer: Self-pay

## 2018-01-08 ENCOUNTER — Emergency Department
Admission: EM | Admit: 2018-01-08 | Discharge: 2018-01-08 | Disposition: A | Discharge status: Home / Self Care | Payer: Medicare HMO | Attending: Emergency Medicine | Admitting: Emergency Medicine

## 2018-01-08 ENCOUNTER — Ambulatory Visit: Payer: Medicare HMO | Admitting: Physical Therapy

## 2018-01-08 DIAGNOSIS — R55 Syncope and collapse: Secondary | ICD-10-CM | POA: Insufficient documentation

## 2018-01-08 DIAGNOSIS — Z8673 Personal history of transient ischemic attack (TIA), and cerebral infarction without residual deficits: Secondary | ICD-10-CM | POA: Diagnosis not present

## 2018-01-08 DIAGNOSIS — R42 Dizziness and giddiness: Secondary | ICD-10-CM | POA: Diagnosis not present

## 2018-01-08 DIAGNOSIS — E1122 Type 2 diabetes mellitus with diabetic chronic kidney disease: Secondary | ICD-10-CM | POA: Insufficient documentation

## 2018-01-08 DIAGNOSIS — Z87891 Personal history of nicotine dependence: Secondary | ICD-10-CM | POA: Diagnosis not present

## 2018-01-08 DIAGNOSIS — N183 Chronic kidney disease, stage 3 (moderate): Secondary | ICD-10-CM | POA: Diagnosis not present

## 2018-01-08 DIAGNOSIS — I129 Hypertensive chronic kidney disease with stage 1 through stage 4 chronic kidney disease, or unspecified chronic kidney disease: Secondary | ICD-10-CM | POA: Insufficient documentation

## 2018-01-08 DIAGNOSIS — M6281 Muscle weakness (generalized): Secondary | ICD-10-CM | POA: Diagnosis not present

## 2018-01-08 DIAGNOSIS — N179 Acute kidney failure, unspecified: Secondary | ICD-10-CM | POA: Diagnosis not present

## 2018-01-08 LAB — COMPREHENSIVE METABOLIC PANEL
ALBUMIN: 4.1 g/dL (ref 3.5–5.0)
ALK PHOS: 65 U/L (ref 38–126)
ALT: 15 U/L — AB (ref 17–63)
ANION GAP: 10 (ref 5–15)
AST: 23 U/L (ref 15–41)
BUN: 26 mg/dL — ABNORMAL HIGH (ref 6–20)
CALCIUM: 8.5 mg/dL — AB (ref 8.9–10.3)
CO2: 23 mmol/L (ref 22–32)
CREATININE: 1.64 mg/dL — AB (ref 0.61–1.24)
Chloride: 102 mmol/L (ref 101–111)
GFR calc non Af Amer: 41 mL/min — ABNORMAL LOW (ref >60)
GFR, EST AFRICAN AMERICAN: 48 mL/min — AB (ref >60)
GLUCOSE: 228 mg/dL — AB (ref 65–99)
Potassium: 4 mmol/L (ref 3.5–5.1)
SODIUM: 135 mmol/L (ref 135–145)
Total Bilirubin: 0.7 mg/dL (ref 0.3–1.2)
Total Protein: 7.3 g/dL (ref 6.5–8.1)

## 2018-01-08 LAB — CBC WITH DIFFERENTIAL/PLATELET
BASOS ABS: 0 10*3/uL (ref 0–0.1)
Basophils Relative: 0 %
EOS ABS: 0 10*3/uL (ref 0–0.7)
Eosinophils Relative: 0 %
HCT: 40.1 % (ref 40.0–52.0)
HEMOGLOBIN: 13.4 g/dL (ref 13.0–18.0)
LYMPHS PCT: 9 %
Lymphs Abs: 1.1 10*3/uL (ref 1.0–3.6)
MCH: 32.8 pg (ref 26.0–34.0)
MCHC: 33.5 g/dL (ref 32.0–36.0)
MCV: 98 fL (ref 80.0–100.0)
MONO ABS: 4.1 10*3/uL — AB (ref 0.2–1.0)
Monocytes Relative: 34 %
Neutro Abs: 7 10*3/uL — ABNORMAL HIGH (ref 1.4–6.5)
Neutrophils Relative %: 57 %
PLATELETS: 132 10*3/uL — AB (ref 150–440)
RBC: 4.09 MIL/uL — ABNORMAL LOW (ref 4.40–5.90)
RDW: 14.1 % (ref 11.5–14.5)
WBC: 12.2 10*3/uL — AB (ref 3.8–10.6)

## 2018-01-08 LAB — TROPONIN I: Troponin I: <0.03 ng/mL (ref <0.03)

## 2018-01-08 MED ORDER — ONDANSETRON 4 MG PO TBDP: 4.0000 mg | ORAL_TABLET | Freq: Three times a day (TID) | ORAL | 0 refills | Status: DC | PRN

## 2018-01-08 MED ORDER — SODIUM CHLORIDE 0.9 % IV SOLN
Freq: Once | INTRAVENOUS | Status: AC
  Administered 2018-01-08: 1000 mL via INTRAVENOUS

## 2018-01-08 MED ORDER — SODIUM CHLORIDE 0.9 % IV SOLN
Freq: Once | INTRAVENOUS | Status: AC
Start: 2018-01-08 — End: 2018-01-08
  Administered 2018-01-08: 1000 mL via INTRAVENOUS

## 2018-01-08 NOTE — ED Notes (Signed)
Pt ambulatory to POV without difficulty. VSS. NAD. Reviewed discharge instructions, RX and follow up. All questions and concerns answered at this time.

## 2018-01-08 NOTE — ED Notes (Signed)
Report to Opal Sidles, [place]

## 2018-01-08 NOTE — ED Triage Notes (Addendum)
Pt to ER via [facility] after syncope X 2. Pt reports that he felt weak yesterday. Pt had syncopal episode with EMS as well. Pt adminstered 300cc NS with EMS to 18G to L [facility] that was started by EMS. Pt CBG in 224 per EMS.  Pt pale and diaphoretic. Pt alert and oriented X4, active, cooperative, pt in NAD. RR even and unlabored

## 2018-01-08 NOTE — ED Provider Notes (Signed)
[facility] Emergency Department Provider Note       Time seen: ----------------------------------------- 10:12 AM on 01/08/2018 -----------------------------------------   I have reviewed the triage vital signs and the nursing notes.  HISTORY   Chief Complaint No chief complaint on file.    HPI Richard Gordon is a 67 y.o. male with a history of chronic kidney disease, diabetes, hyperlipidemia and hypertension who presents to the ED for syncope.  Patient was at the [facility] this morning and had a syncopal or near syncopal event.  He then tried to stand up for EMS and almost passed out again.  Patient reports he has been sick recently and has for flulike symptoms as well as recent nausea and vomiting.  He thinks he may be dehydrated.  He has not had anything to eat this morning.  He reports he has not had syncope in the past.  Past Medical History:  Diagnosis Date  . Chronic kidney disease (CKD), stage III (moderate) ([place]) 11/03/2015  . Diabetes mellitus without complication ([place])   . Hyperlipidemia   . Hypertension   . Stroke [facility])     Patient Active Problem List   Diagnosis Date Noted  . AD (atopic dermatitis) 11/03/2015  . Chronic kidney disease (CKD), stage III (moderate) ([place]) 11/03/2015  . Personal history of stroke with residual effects 11/03/2015  . Hyperlipidemia associated with type 2 diabetes mellitus ([place]) 07/09/2015  . Hypertension 07/09/2015  . DM type 2 with diabetic peripheral neuropathy ([place]) 05/22/2015    Past Surgical History:  Procedure Laterality Date  . CATARACT EXTRACTION    . TONSILLECTOMY      Allergies Patient has no known allergies.  Social History Social History   Tobacco Use  . Smoking status: Former Research scientist (life sciences)  . Smokeless tobacco: Never Used  Substance Use Topics  . Alcohol use: No    Alcohol/week: 0.0 oz  . Drug use: No    Review of Systems Constitutional: Negative for fever.  Positive for chills and body  aches Cardiovascular: Negative for chest pain. Respiratory: Negative for shortness of breath. Gastrointestinal: Negative for abdominal pain, positive for vomiting Musculoskeletal: Negative for back pain. Skin: Negative for rash. Neurological: Negative for headaches, positive for weakness  All systems negative/normal/unremarkable except as stated in the HPI  ____________________________________________   PHYSICAL EXAM:  VITAL SIGNS: ED Triage Vitals  Enc Vitals Group     BP      Pulse      Resp      Temp      Temp src      SpO2      Weight      Height      Head Circumference      Peak Flow      Pain Score      Pain Loc      Pain Edu?      Excl. in [place]?     Constitutional: Alert and oriented. Well appearing and in no distress. Eyes: Conjunctivae are normal. Normal extraocular movements. ENT   Head: Normocephalic and atraumatic.   Nose: No congestion/rhinnorhea.   Mouth/Throat: Mucous membranes are moist.   Neck: No stridor. Cardiovascular: Normal rate, regular rhythm. No murmurs, rubs, or gallops. Respiratory: Normal respiratory effort without tachypnea nor retractions. Breath sounds are clear and equal bilaterally. No wheezes/rales/rhonchi. Gastrointestinal: Soft and nontender. Normal bowel sounds Musculoskeletal: Nontender with normal range of motion in extremities. No lower extremity tenderness nor edema. Neurologic:  Normal speech and language. No  gross focal neurologic deficits are appreciated.  Skin:  Skin is warm, dry and intact. No rash noted. Psychiatric: Mood and affect are normal. Speech and behavior are normal.  ____________________________________________  EKG: Interpreted by me.  Sinus rhythm rate 83 bpm, normal PR interval, normal QRS, normal QT.  ____________________________________________  ED COURSE:  As part of my medical decision making, I reviewed the following data within the [facility] History obtained from  family if available, nursing notes, old chart and ekg, as well as notes from prior ED visits. Patient presented for syncope, we will assess with labs and imaging as indicated at this time.   Procedures ____________________________________________   LABS (pertinent positives/negatives)  Labs Reviewed  CBC WITH DIFFERENTIAL/PLATELET - Abnormal; Notable for the following components:      Result Value   WBC 12.2 (*)    RBC 4.09 (*)    Platelets 132 (*)    Neutro Abs 7.0 (*)    Monocytes Absolute 4.1 (*)    All other components within normal limits  COMPREHENSIVE METABOLIC PANEL - Abnormal; Notable for the following components:   Glucose, Bld 228 (*)    BUN 26 (*)    Creatinine, Ser 1.64 (*)    Calcium 8.5 (*)    ALT 15 (*)    GFR calc non Af Amer 41 (*)    GFR calc Af Amer 48 (*)    All other components within normal limits  TROPONIN I  URINALYSIS, COMPLETE (UACMP) WITH MICROSCOPIC  CBG MONITORING, ED  ____________________________________________  DIFFERENTIAL DIAGNOSIS   Dehydration, electrolyte abnormality, arrhythmia, MI, PE  FINAL ASSESSMENT AND PLAN  Syncope   Plan: Patient had presented for syncope likely due to dehydration. Patient's labs did reflect acute kidney injury likely from dehydration.  Was given IV fluids here and feels better, he is stable for outpatient follow-up.   Laurence Aly, MD   Note: This note was generated in part or whole with voice recognition software. Voice recognition is usually quite accurate but there are transcription errors that can and very often do occur. I apologize for any typographical errors that were not detected and corrected.     Earleen Newport, MD 01/08/18 1320

## 2018-01-10 ENCOUNTER — Ambulatory Visit: Payer: Medicare HMO | Admitting: Physical Therapy

## 2018-01-15 ENCOUNTER — Ambulatory Visit: Payer: Medicare HMO | Admitting: Physical Therapy

## 2018-01-17 ENCOUNTER — Ambulatory Visit: Payer: Medicare HMO | Admitting: Physical Therapy

## 2018-01-19 ENCOUNTER — Other Ambulatory Visit: Payer: Self-pay | Admitting: Family Medicine

## 2018-01-19 DIAGNOSIS — N179 Acute kidney failure, unspecified: Secondary | ICD-10-CM

## 2018-01-19 MED ORDER — LISINOPRIL 20 MG PO TABS: 20.0000 mg | ORAL_TABLET | Freq: Every day | ORAL | 1 refills | Status: DC

## 2018-01-19 NOTE — Telephone Encounter (Signed)
Last OV 12/08/17 last filled by Dr.Cook 07/10/17 90 1rf

## 2018-01-19 NOTE — Telephone Encounter (Signed)
PT came in office stating he called in order for lisinopril (PRINIVIL,ZESTRIL) 20 MG tablet and was sent hydrochlorothiazide (HYDRODIURIL) 12.5 MG tablet instead. Patient would like Lisinopril called into  [place], [place] [facility] [place] [phone] (Phone) [phone] (Fax)

## 2018-01-19 NOTE — Telephone Encounter (Signed)
The pharmacy.  Patient needs a follow-up BMP.  It looks like it is in the emergency department for dehydration recently.  Please get him set up for labs.  Please also reinforce when he has an illness with vomiting or diarrhea he should not take his Jardiance and then resume it once his symptoms resolved.  Thanks.

## 2018-01-22 ENCOUNTER — Ambulatory Visit: Payer: Medicare HMO | Admitting: Physical Therapy

## 2018-01-22 ENCOUNTER — Telehealth: Payer: Self-pay

## 2018-01-22 NOTE — Telephone Encounter (Signed)
Left message to return call,ok for pec to schedule a non fasting lab appointment and notify patient to discontinue the [place] any time he has vomiting or diarrhea until all symptoms are resolved.

## 2018-01-24 ENCOUNTER — Ambulatory Visit: Payer: Medicare HMO | Admitting: Physical Therapy

## 2018-01-29 ENCOUNTER — Ambulatory Visit: Payer: Medicare HMO | Admitting: Physical Therapy

## 2018-01-31 ENCOUNTER — Ambulatory Visit: Payer: Medicare HMO | Admitting: Physical Therapy

## 2018-02-02 DIAGNOSIS — R69 Illness, unspecified: Secondary | ICD-10-CM | POA: Diagnosis not present

## 2018-02-16 NOTE — Telephone Encounter (Signed)
Unable top reach per phone, sent letter

## 2018-02-26 ENCOUNTER — Other Ambulatory Visit (INDEPENDENT_AMBULATORY_CARE_PROVIDER_SITE_OTHER): Payer: Medicare HMO

## 2018-02-26 DIAGNOSIS — E782 Mixed hyperlipidemia: Secondary | ICD-10-CM

## 2018-02-26 DIAGNOSIS — N179 Acute kidney failure, unspecified: Secondary | ICD-10-CM

## 2018-02-26 LAB — LDL CHOLESTEROL, DIRECT: Direct LDL: 38 mg/dL

## 2018-02-26 LAB — COMPREHENSIVE METABOLIC PANEL
ALT: 10 U/L (ref 0–53)
AST: 13 U/L (ref 0–37)
Albumin: 4 g/dL (ref 3.5–5.2)
Alkaline Phosphatase: 58 U/L (ref 39–117)
BUN: 18 mg/dL (ref 6–23)
CHLORIDE: 101 meq/L (ref 96–112)
CO2: 25 meq/L (ref 19–32)
CREATININE: 1.35 mg/dL (ref 0.40–1.50)
Calcium: 9.6 mg/dL (ref 8.4–10.5)
GFR: 55.84 mL/min — ABNORMAL LOW (ref >60.00)
Glucose, Bld: 184 mg/dL — ABNORMAL HIGH (ref 70–99)
POTASSIUM: 4.2 meq/L (ref 3.5–5.1)
SODIUM: 135 meq/L (ref 135–145)
Total Bilirubin: 0.6 mg/dL (ref 0.2–1.2)
Total Protein: 7.5 g/dL (ref 6.0–8.3)

## 2018-02-26 LAB — LIPID PANEL
CHOL/HDL RATIO: 4
CHOLESTEROL: 105 mg/dL (ref 0–200)
HDL: 23.7 mg/dL — ABNORMAL LOW (ref >39.00)
NONHDL: 81.5
Triglycerides: 255 mg/dL — ABNORMAL HIGH (ref 0.0–149.0)
VLDL: 51 mg/dL — AB (ref 0.0–40.0)

## 2018-02-27 ENCOUNTER — Telehealth: Payer: Self-pay | Admitting: Family Medicine

## 2018-02-27 ENCOUNTER — Other Ambulatory Visit: Payer: Self-pay | Admitting: Family Medicine

## 2018-02-27 MED ORDER — METFORMIN HCL 1000 MG PO TABS: 1000.0000 mg | ORAL_TABLET | Freq: Two times a day (BID) | ORAL | 3 refills | Status: DC

## 2018-02-27 MED ORDER — LISINOPRIL 20 MG PO TABS: 20.0000 mg | ORAL_TABLET | Freq: Every day | ORAL | 1 refills | Status: DC

## 2018-02-27 NOTE — Telephone Encounter (Signed)
Sent to pharmacy

## 2018-02-27 NOTE — Telephone Encounter (Signed)
The patient would like a refill on his   metFORMIN (GLUCOPHAGE) 1000 MG tablet   Lisinopril (PRINIVIL,ZESTRIL) 20 MG tablet   Send  to  [facility] Delivery for 90 day refill .

## 2018-03-06 ENCOUNTER — Telehealth: Payer: Self-pay

## 2018-03-06 NOTE — Telephone Encounter (Signed)
Copied from [place]. Topic: Inquiry >> Mar 06, 2018  1:38 PM Pricilla Handler wrote: Reason for CRM: Patient would like Deirdre Pippins to call him on Monday regarding a medication pricing question. Please call patient on Monday 03/12/2018.       Thank You!!!

## 2018-03-09 ENCOUNTER — Ambulatory Visit (INDEPENDENT_AMBULATORY_CARE_PROVIDER_SITE_OTHER): Payer: Medicare HMO | Admitting: Family Medicine

## 2018-03-09 ENCOUNTER — Encounter: Payer: Self-pay | Admitting: Family Medicine

## 2018-03-09 ENCOUNTER — Other Ambulatory Visit: Payer: Self-pay

## 2018-03-09 VITALS — BP 130/70 | HR 77 | Temp 98.0°F | Ht 70.0 in | Wt 180.0 lb

## 2018-03-09 DIAGNOSIS — R55 Syncope and collapse: Secondary | ICD-10-CM

## 2018-03-09 DIAGNOSIS — E1142 Type 2 diabetes mellitus with diabetic polyneuropathy: Secondary | ICD-10-CM

## 2018-03-09 DIAGNOSIS — I6930 Unspecified sequelae of cerebral infarction: Secondary | ICD-10-CM | POA: Diagnosis not present

## 2018-03-09 DIAGNOSIS — D72829 Elevated white blood cell count, unspecified: Secondary | ICD-10-CM

## 2018-03-09 DIAGNOSIS — I10 Essential (primary) hypertension: Secondary | ICD-10-CM

## 2018-03-09 DIAGNOSIS — E785 Hyperlipidemia, unspecified: Secondary | ICD-10-CM

## 2018-03-09 DIAGNOSIS — E1169 Type 2 diabetes mellitus with other specified complication: Secondary | ICD-10-CM

## 2018-03-09 NOTE — Assessment & Plan Note (Signed)
Well-controlled.  Continue current regimen.

## 2018-03-09 NOTE — Assessment & Plan Note (Signed)
No acute changes.  Continue current management.

## 2018-03-09 NOTE — Assessment & Plan Note (Signed)
Check A1c next week.

## 2018-03-09 NOTE — Progress Notes (Signed)
Tommi Rumps, MD Phone: [phone]  Richard Gordon is a 67 y.o. male who presents today for f/u.  HYPERTENSION Disease Monitoring: Blood pressure range-not checking Chest pain, palpitations- no      Dyspnea- no Medications: Compliance- taking HCTZ, lisinopril Lightheadedness,Syncope- yes, see below   Edema- no  DIABETES Disease Monitoring: Blood Sugar ranges-150s-190s Polyuria/phagia/dipsia- no      Optho- UTD Medications: Compliance- taking jardiance, lantus 41 u daily, metformin Hypoglycemic symptoms- no symptoms though rarely drops to 70s  HYPERLIPIDEMIA Disease Monitoring: See symptoms for Hypertension Medications: Compliance- taking simvastatin Right upper quadrant pain- no  Muscle aches- no  He has a history of a stroke.  He has had chronic numbness on his left hand as well as his left foot since then.  Notes this has relatively been stable and notices it more at certain times of the year.  No sudden changes.  No weakness.  He remains on aspirin.  Patient was evaluated in the emergency department for a syncopal episode in February.  He was at the [facility] and he got up once he was called after sitting there for several hours and then reports that he passed out.  He was evaluated by EMS and it seems as though he got up again and reportedly came close to passing out.  He had been feeling sick previously.  He did note an illness with nausea and vomiting prior to this.  He had no diarrhea.  No palpitations.  No chest pain.  No other symptoms.  He has never passed out previously.  He was found to be dehydrated in the emergency department and given IV fluids.  He improved with that.  He has not had any recurrence.  Social History   Tobacco Use  Smoking Status Former Smoker  Smokeless Tobacco Never Used     ROS see history of present illness  Objective  Physical Exam Vitals:   03/09/18 1317  BP: 130/70  Pulse: 77  Temp: 98 F (36.7 C)  SpO2: 96%    BP Readings from  Last 3 Encounters:  03/09/18 130/70  01/08/18 136/65  12/08/17 116/60   Wt Readings from Last 3 Encounters:  03/09/18 180 lb (81.6 kg)  01/08/18 180 lb (81.6 kg)  12/08/17 180 lb (81.6 kg)    Physical Exam  Constitutional: No distress.  Cardiovascular: Normal rate, regular rhythm and normal heart sounds.  Pulmonary/Chest: Effort normal and breath sounds normal.  Musculoskeletal: He exhibits no edema.  Neurological: He is alert. Gait normal.  Skin: Skin is warm and dry. He is not diaphoretic.     Assessment/Plan: Please see individual problem list.  DM type 2 with diabetic peripheral neuropathy Check A1c next week.  Hyperlipidemia associated with type 2 diabetes mellitus ([name]) Continue simvastatin.  Hypertension Well-controlled.  Continue current regimen.  Personal history of stroke with residual effects No acute changes.  Continue current management.  Syncope I suspect this was related to dehydration and likely orthostasis based on lab work and evaluation in the ED.  He had been ill prior to the event with vomiting and appeared dehydrated on labs.  He improved with fluids.  He has had no recurrent issues.  I did discuss potential for seeing cardiology though he deferred at this time.  Given return precautions.   Health Maintenance: Declines colon cancer screening.  Discussed the risk of missing a colon cancer if he did not undergo screening.  Orders Placed This Encounter  Procedures  . HgB A1c  Standing Status:   Future    Standing Expiration Date:   03/10/2019  . CBC w/Diff    Standing Status:   Future    Standing Expiration Date:   03/10/2019    No orders of the defined types were placed in this encounter.    Tommi Rumps, MD [place]

## 2018-03-09 NOTE — Assessment & Plan Note (Signed)
Continue simvastatin.

## 2018-03-09 NOTE — Assessment & Plan Note (Addendum)
I suspect this was related to dehydration and likely orthostasis based on lab work and evaluation in the ED.  He had been ill prior to the event with vomiting and appeared dehydrated on labs.  He improved with fluids.  He has had no recurrent issues.  I did discuss potential for seeing cardiology though he deferred at this time.  Given return precautions.

## 2018-03-09 NOTE — Patient Instructions (Signed)
Nice to see you. We will have you return in 7-10 days for labs. We will check with our clinical pharmacist regarding medication assistance. Please stay well hydrated and if you have any lightheadedness or recurrent passing out you need to be evaluated immediately.

## 2018-03-16 ENCOUNTER — Other Ambulatory Visit (INDEPENDENT_AMBULATORY_CARE_PROVIDER_SITE_OTHER): Payer: Medicare HMO

## 2018-03-16 DIAGNOSIS — D72829 Elevated white blood cell count, unspecified: Secondary | ICD-10-CM

## 2018-03-16 DIAGNOSIS — E1142 Type 2 diabetes mellitus with diabetic polyneuropathy: Secondary | ICD-10-CM

## 2018-03-16 LAB — CBC WITH DIFFERENTIAL/PLATELET
BASOS PCT: 0.6 % (ref 0.0–3.0)
Basophils Absolute: 0.1 10*3/uL (ref 0.0–0.1)
EOS PCT: 0.9 % (ref 0.0–5.0)
Eosinophils Absolute: 0.1 10*3/uL (ref 0.0–0.7)
HCT: 42.8 % (ref 39.0–52.0)
HEMOGLOBIN: 15 g/dL (ref 13.0–17.0)
LYMPHS PCT: 15.7 % (ref 12.0–46.0)
Lymphs Abs: 1.3 10*3/uL (ref 0.7–4.0)
MCHC: 35.1 g/dL (ref 30.0–36.0)
MCV: 96.4 fl (ref 78.0–100.0)
Monocytes Absolute: 1.8 10*3/uL — ABNORMAL HIGH (ref 0.1–1.0)
Monocytes Relative: 21.4 % — ABNORMAL HIGH (ref 3.0–12.0)
Neutro Abs: 5.1 10*3/uL (ref 1.4–7.7)
Neutrophils Relative %: 61.4 % (ref 43.0–77.0)
Platelets: 108 10*3/uL — ABNORMAL LOW (ref 150.0–400.0)
RBC: 4.44 Mil/uL (ref 4.22–5.81)
RDW: 14.9 % (ref 11.5–15.5)
WBC: 8.3 10*3/uL (ref 4.0–10.5)

## 2018-03-16 LAB — HEMOGLOBIN A1C: Hgb A1c MFr Bld: 7.1 % — ABNORMAL HIGH (ref 4.6–6.5)

## 2018-03-19 ENCOUNTER — Telehealth: Payer: Self-pay

## 2018-03-19 ENCOUNTER — Other Ambulatory Visit: Payer: Self-pay | Admitting: Family Medicine

## 2018-03-19 NOTE — Telephone Encounter (Signed)
Left voicemail for patient to call back about lab results. Ok for [facility] to give patient results and schedule lab appointment.

## 2018-03-20 ENCOUNTER — Telehealth: Payer: Self-pay

## 2018-03-20 NOTE — Telephone Encounter (Signed)
Please see note for lab results. Left patient voicemail to contact office about labs. Ok for [facility] to give results

## 2018-03-20 NOTE — Telephone Encounter (Signed)
-----  Message from Leone Haven, MD sent at 03/17/2018  7:44 AM EDT ----- Please let the patient know that his A1c is improved to 7.1 and is near goal. I would like for him to work on diet and exercise and plan to recheck this at an office visit in 3 months. His platelets are low and lower than prior. I would like to check these this coming week and if they remain low consider referral to hematology for further evaluation. Thanks.

## 2018-03-26 NOTE — Telephone Encounter (Signed)
Please contact the patient and see if he still needs this medication.  When I last saw him he did not mention needing anything for nausea.  Thanks.

## 2018-03-26 NOTE — Telephone Encounter (Signed)
Okay to refill?

## 2018-03-27 ENCOUNTER — Telehealth: Payer: Self-pay | Admitting: Radiology

## 2018-03-27 ENCOUNTER — Telehealth: Payer: Self-pay | Admitting: Family Medicine

## 2018-03-27 DIAGNOSIS — D696 Thrombocytopenia, unspecified: Secondary | ICD-10-CM

## 2018-03-27 NOTE — Telephone Encounter (Signed)
Pt coming in for labs tomorrow, please place future orders. Thank you.

## 2018-03-27 NOTE — Telephone Encounter (Signed)
Please advise

## 2018-03-27 NOTE — Addendum Note (Signed)
Addended by: Caryl Bis, Lore Polka G on: 03/27/2018 12:21 PM   Modules accepted: Orders

## 2018-03-27 NOTE — Telephone Encounter (Signed)
Copied from [place] [id number]. Topic: Quick Communication - See Telephone Encounter >> Mar 27, 2018  9:02 AM Aurelio Brash B wrote: CRM for notification. See Telephone encounter for: 03/27/18. PT states he can not afford empagliflozin (JARDIANCE) 10 MG TABS tablet ,  he is asking for help on the cost of the medication or an alternative

## 2018-03-27 NOTE — Telephone Encounter (Signed)
Left message to return call, ok for pec to inform patient that we will have our pharmacist look into this medication to see if she is able to get him approved for assistance with cost

## 2018-03-27 NOTE — Telephone Encounter (Signed)
I will forward this to Chrys Racer to see if she can look in to the patient insurance to see what is covered for his diabetes or to see what assistance options there are for the patient.

## 2018-03-28 ENCOUNTER — Other Ambulatory Visit (INDEPENDENT_AMBULATORY_CARE_PROVIDER_SITE_OTHER): Payer: Medicare HMO

## 2018-03-28 DIAGNOSIS — D696 Thrombocytopenia, unspecified: Secondary | ICD-10-CM | POA: Diagnosis not present

## 2018-03-28 LAB — CBC WITH DIFFERENTIAL/PLATELET
BASOS PCT: 0.6 % (ref 0.0–3.0)
Basophils Absolute: 0 10*3/uL (ref 0.0–0.1)
EOS PCT: 0.7 % (ref 0.0–5.0)
Eosinophils Absolute: 0 10*3/uL (ref 0.0–0.7)
HEMATOCRIT: 44 % (ref 39.0–52.0)
Hemoglobin: 14.8 g/dL (ref 13.0–17.0)
LYMPHS ABS: 1.3 10*3/uL (ref 0.7–4.0)
LYMPHS PCT: 21.5 % (ref 12.0–46.0)
MCHC: 33.7 g/dL (ref 30.0–36.0)
MCV: 97.7 fl (ref 78.0–100.0)
MONOS PCT: 23.4 % — AB (ref 3.0–12.0)
Monocytes Absolute: 1.5 10*3/uL — ABNORMAL HIGH (ref 0.1–1.0)
NEUTROS ABS: 3.4 10*3/uL (ref 1.4–7.7)
NEUTROS PCT: 53.8 % (ref 43.0–77.0)
PLATELETS: 134 10*3/uL — AB (ref 150.0–400.0)
RBC: 4.51 Mil/uL (ref 4.22–5.81)
RDW: 14.7 % (ref 11.5–15.5)
WBC: 6.3 10*3/uL (ref 4.0–10.5)

## 2018-03-28 NOTE — Telephone Encounter (Signed)
Called patient and he states he does not a refill on medication it was given to him whenever he was seen for his stomach issues. He also states that he is not able to afford Jardiance because he is in the [place] and would like to know if there is anything else that you can prescribe.

## 2018-03-29 ENCOUNTER — Other Ambulatory Visit: Payer: Self-pay | Admitting: Family Medicine

## 2018-03-29 DIAGNOSIS — D696 Thrombocytopenia, unspecified: Secondary | ICD-10-CM

## 2018-03-29 NOTE — Telephone Encounter (Signed)
Patient notified

## 2018-03-29 NOTE — Telephone Encounter (Signed)
I will forward to Chrys Racer to see what the best options for his diabetes would be based on his insurance coverage.

## 2018-04-01 DIAGNOSIS — D696 Thrombocytopenia, unspecified: Secondary | ICD-10-CM | POA: Insufficient documentation

## 2018-04-01 NOTE — Progress Notes (Signed)
[place]  Telephone:(336) [phone] Fax:(336) [phone]  ID: MERCER PEIFER OB: Aug 05, 1951  MR#: [id number]  MWN#:[id number]  Patient Care Team: Leone Haven, MD as PCP - General (Family Medicine)  CHIEF COMPLAINT: Thrombocytopenia  INTERVAL HISTORY: Patient is a 67 year old male who was noted to have a mild, but persistent thrombocytopenia and routine blood work.  He currently feels well and is asymptomatic.  He has no neurologic complaints.  He denies any easy bleeding or bruising.  He has a good appetite and denies weight loss.  He has no chest pain or shortness of breath.  He denies any nausea, vomiting, constipation, or diarrhea.  He has no urinary complaints.  Patient feels at his baseline offers no specific complaints today.  REVIEW OF SYSTEMS:   Review of Systems  Constitutional: Negative.  Negative for fever, malaise/fatigue and weight loss.  Respiratory: Negative.  Negative for cough and shortness of breath.   Cardiovascular: Negative.  Negative for chest pain and leg swelling.  Gastrointestinal: Negative.  Negative for abdominal pain and constipation.  Genitourinary: Negative.  Negative for dysuria.  Musculoskeletal: Negative.   Skin: Negative.  Negative for rash.  Neurological: Negative.  Negative for sensory change, focal weakness and weakness.  Psychiatric/Behavioral: Negative.  The patient is not nervous/anxious.     As per HPI. Otherwise, a complete review of systems is negative.  PAST MEDICAL HISTORY: Past Medical History:  Diagnosis Date  . Chronic kidney disease (CKD), stage III (moderate) ([place]) 11/03/2015  . Diabetes mellitus without complication ([place])   . Hyperlipidemia   . Hypertension   . Stroke [facility])    per pt 2 CVA in 2000,and 1998 (  R ,then L side effected seperately per pt )    PAST SURGICAL HISTORY: Past Surgical History:  Procedure Laterality Date  . CATARACT EXTRACTION    . TONSILLECTOMY      FAMILY HISTORY: Family  History  Problem Relation Age of Onset  . Hypertension Father   . Diabetes Father   . Brain cancer Mother        per pt he was 64 and thinks this was cancer   . Coronary artery disease Sister        scarlet fever as child affected heart per pt    ADVANCED DIRECTIVES (Y/N):  N  HEALTH MAINTENANCE: Social History   Tobacco Use  . Smoking status: Former Smoker    Packs/day: 0.50    Years: 25.00    Pack years: 12.50    Types: Cigarettes    Last attempt to quit: 04/04/1999    Years since quitting: 19.0  . Smokeless tobacco: Former Systems developer    Types: Snuff  Substance Use Topics  . Alcohol use: No    Alcohol/week: 0.0 oz  . Drug use: No     Colonoscopy:  PAP:  Bone density:  Lipid panel:  No Known Allergies  Current Outpatient Medications  Medication Sig Dispense Refill  . aspirin EC 81 MG tablet Take 1 tablet (81 mg total) by mouth daily.    . empagliflozin (JARDIANCE) 10 MG TABS tablet Take 10 mg by mouth daily. 90 tablet 3  . GARLIC OIL PO Take by mouth.    . hydrochlorothiazide (HYDRODIURIL) 12.5 MG tablet Take 1 tablet (12.5 mg total) by mouth daily. 90 tablet 1  . Insulin Glargine (BASAGLAR KWIKPEN) 100 UNIT/ML SOPN Inject 0.42 mLs (42 Units total) into the skin at bedtime. 5 pen 3  . lisinopril (PRINIVIL,ZESTRIL) 20 MG tablet  Take 1 tablet (20 mg total) by mouth daily. 90 tablet 1  . metFORMIN (GLUCOPHAGE) 1000 MG tablet Take 1 tablet (1,000 mg total) by mouth 2 (two) times daily. (Patient taking differently: Take 1,000 mg by mouth daily with breakfast. ) 180 tablet 3  . Omega-3 Fatty Acids (FISH OIL) 1000 MG CAPS Take by mouth.     . simvastatin (ZOCOR) 5 MG tablet Take 1 tablet (5 mg total) by mouth daily at 6 PM. 90 tablet 3  . blood glucose meter kit and supplies KIT Accu chek. Check twice daily. E11.42. (Patient not taking: Reported on 04/03/2018) 1 each 0  . glucose blood (ONETOUCH VERIO) test strip Use as instructed (Patient not taking: Reported on 04/03/2018) 100  each 12  . Insulin Pen Needle 31G X 5 MM MISC 1 each by Does not apply route daily. (Patient not taking: Reported on 04/03/2018) 100 each 3  . Lancets (ACCU-CHEK SOFT TOUCH) lancets Check blood sugar twice daily. DX: E11.42.  Quantity Sufficient 90 day supply (Patient not taking: Reported on 04/03/2018) 300 each 3  . ondansetron (ZOFRAN ODT) 4 MG disintegrating tablet Take 1 tablet (4 mg total) by mouth every 8 (eight) hours as needed for nausea or vomiting. (Patient not taking: Reported on 04/03/2018) 20 tablet 0   No current facility-administered medications for this visit.     OBJECTIVE: Vitals:   04/03/18 1512  BP: 135/84  Pulse: 77  Resp: 18  Temp: (!) 96.1 F (35.6 C)     Body mass index is 25.48 kg/m.    ECOG FS:0 - Asymptomatic  General: Well-developed, well-nourished, no acute distress. Eyes: Pink conjunctiva, anicteric sclera. HEENT: Normocephalic, moist mucous membranes, clear oropharnyx. Lungs: Clear to auscultation bilaterally. Heart: Regular rate and rhythm. No rubs, murmurs, or gallops. Abdomen: Soft, nontender, nondistended. No organomegaly noted, normoactive bowel sounds. Musculoskeletal: No edema, cyanosis, or clubbing. Neuro: Alert, answering all questions appropriately. Cranial nerves grossly intact. Skin: No rashes or petechiae noted. Psych: Normal affect. Lymphatics: No cervical, calvicular, axillary or inguinal LAD.   LAB RESULTS:  Lab Results  Component Value Date   NA 135 02/26/2018   K 4.2 02/26/2018   CL 101 02/26/2018   CO2 25 02/26/2018   GLUCOSE 184 (H) 02/26/2018   BUN 18 02/26/2018   CREATININE 1.35 02/26/2018   CALCIUM 9.6 02/26/2018   PROT 7.5 02/26/2018   ALBUMIN 4.0 02/26/2018   AST 13 02/26/2018   ALT 10 02/26/2018   ALKPHOS 58 02/26/2018   BILITOT 0.6 02/26/2018   GFRNONAA 41 (L) 01/08/2018   GFRAA 48 (L) 01/08/2018    Lab Results  Component Value Date   WBC 6.7 04/03/2018   NEUTROABS 3.4 03/28/2018   HGB 14.3 04/03/2018    HCT 40.5 04/03/2018   MCV 95.5 04/03/2018   PLT 133 (L) 04/03/2018     STUDIES: No results found.  ASSESSMENT: Thrombocytopenia  PLAN:    1. Thrombocytopenia: Patient's platelet count is mildly decreased at 133, but essentially stable.  All of his other laboratory work including iron stores, folate, B12, and SPEP are negative or within normal limits.  Platelet antibodies are also negative.  Patient may have ITP, but this would take a bone marrow biopsy to diagnose which is unnecessary at this point.  No intervention is needed.  Return to clinic in 3 months with repeat laboratory work and further evaluation.  Approximately 45 minutes was spent in discussion of which greater than 50% was consultation.  Patient expressed understanding and  was in agreement with this plan. He also understands that He can call clinic at any time with any questions, concerns, or complaints.    Lloyd Huger, MD   04/04/2018 11:08 PM

## 2018-04-02 NOTE — Telephone Encounter (Signed)
Called pt to discuss patient assistance, no answer. Left HIPAA compliant VM. Patient should ideally schedule with me in person on a Monday as he gets very confused over the phone regarding medication pricing.   [name], Pharm.D., BCPS PGY2 Ambulatory Care Pharmacy Resident Phone: [phone]

## 2018-04-02 NOTE — Telephone Encounter (Signed)
Janett Billow, can you try to get the patient scheduled with Chrys Racer to discuss patient assistance for medications?  Thanks.

## 2018-04-03 ENCOUNTER — Inpatient Hospital Stay: Payer: Medicare HMO

## 2018-04-03 ENCOUNTER — Encounter: Payer: Self-pay | Admitting: Oncology

## 2018-04-03 ENCOUNTER — Inpatient Hospital Stay: Payer: Medicare HMO | Attending: Oncology | Admitting: Oncology

## 2018-04-03 ENCOUNTER — Other Ambulatory Visit: Payer: Self-pay

## 2018-04-03 DIAGNOSIS — D696 Thrombocytopenia, unspecified: Secondary | ICD-10-CM | POA: Insufficient documentation

## 2018-04-03 LAB — CBC
HCT: 40.5 % (ref 40.0–52.0)
HEMOGLOBIN: 14.3 g/dL (ref 13.0–18.0)
MCH: 33.6 pg (ref 26.0–34.0)
MCHC: 35.2 g/dL (ref 32.0–36.0)
MCV: 95.5 fL (ref 80.0–100.0)
PLATELETS: 133 10*3/uL — AB (ref 150–440)
RBC: 4.24 MIL/uL — ABNORMAL LOW (ref 4.40–5.90)
RDW: 14 % (ref 11.5–14.5)
WBC: 6.7 10*3/uL (ref 3.8–10.6)

## 2018-04-03 LAB — FOLATE: FOLATE: 32 ng/mL (ref >5.9)

## 2018-04-03 LAB — IRON AND TIBC
Iron: 81 ug/dL (ref 45–182)
Saturation Ratios: 25 % (ref 17.9–39.5)
TIBC: 330 ug/dL (ref 250–450)
UIBC: 249 ug/dL

## 2018-04-03 LAB — FERRITIN: FERRITIN: 48 ng/mL (ref 24–336)

## 2018-04-03 LAB — LACTATE DEHYDROGENASE: LDH: 106 U/L (ref 98–192)

## 2018-04-03 LAB — VITAMIN B12: Vitamin B-12: 711 pg/mL (ref 180–914)

## 2018-04-03 NOTE — Telephone Encounter (Signed)
Patient returned my call today, scheduled appointment with him next Monday.   [name], Pharm.D., BCPS PGY2 Ambulatory Care Pharmacy Resident Phone: [phone]

## 2018-04-03 NOTE — Progress Notes (Signed)
Here for new pt evaluation.

## 2018-04-04 LAB — PLATELET ANTIBODY PROFILE
Glycoprotein IV Antibody: NEGATIVE
HLA Ab Ser Ql EIA: NEGATIVE
IA/IIA ANTIBODY: NEGATIVE
IB/IX ANTIBODY: NEGATIVE
IIB/IIIA ANTIBODY: NEGATIVE

## 2018-04-04 LAB — PROTEIN ELECTROPHORESIS, SERUM
A/G Ratio: 1.3 (ref 0.7–1.7)
Albumin ELP: 3.7 g/dL (ref 2.9–4.4)
Alpha-1-Globulin: 0.2 g/dL (ref 0.0–0.4)
Alpha-2-Globulin: 0.6 g/dL (ref 0.4–1.0)
BETA GLOBULIN: 0.9 g/dL (ref 0.7–1.3)
GAMMA GLOBULIN: 1 g/dL (ref 0.4–1.8)
GLOBULIN, TOTAL: 2.8 g/dL (ref 2.2–3.9)
Total Protein ELP: 6.5 g/dL (ref 6.0–8.5)

## 2018-04-09 ENCOUNTER — Ambulatory Visit (INDEPENDENT_AMBULATORY_CARE_PROVIDER_SITE_OTHER): Payer: Medicare HMO | Admitting: Pharmacist

## 2018-04-09 DIAGNOSIS — E1165 Type 2 diabetes mellitus with hyperglycemia: Secondary | ICD-10-CM

## 2018-04-09 MED ORDER — EMPAGLIFLOZIN 10 MG PO TABS: 10.0000 mg | ORAL_TABLET | Freq: Every day | ORAL | 3 refills | Status: DC

## 2018-04-09 NOTE — Progress Notes (Signed)
I have reviewed the above note and agree.  Tommi Rumps, M.D.

## 2018-04-09 NOTE — Patient Instructions (Signed)
Good to see you. We have provided samples for jardiance and basaglar. I will send in the patient assistance application for Jardiance today. I need that report from your pharmacy (how much money you have spent on prescription costs this year) to submit the application for Basaglar.   Call me when you get it in the mail and bring it by the doctor's office. I will get it the following Monday.

## 2018-04-09 NOTE — Progress Notes (Signed)
Saw patient today for medication assistance with basaglar and jardiance as patient is in the donut hole. Reports he thinks he has partial extra help. Brings in financial documentation. Called patient's pharmacy to obtain out of pocket drug spend year-to-date for lilly application. Mail order pharmacy confirms patient has LIS/Extra Help Category 4.  Per mail order pharmacy, they will mail him an out of pocket expense report, year to date within the next 7-10 business days. Patient to bring in to office when he receives.   Filled out applications for Jardiance 10 mg daily and Basaglar 42 units daily.   Medication Samples have been provided to the patient.  Drug name: Jardiance     Strength: 10 mg        Qty: 21 tabs  LOT: [id number]  Exp.Date: 05/2020  Dosing instructions: 1 tab by mouth daily   Drug name: Basaglar       Strength: U100        Qty: 1 pen  LOT: [id number]  Exp.Date: 11/2018  Dosing instructions: Inject 42 units under the skin once daily  The patient has been instructed regarding the correct time, dose, and frequency of taking these medications, including desired effects and most common side effects.   [name] 1:52 PM 04/09/2018

## 2018-04-09 NOTE — Addendum Note (Signed)
Addended by: Deirdre Pippins E on: 04/09/2018 02:14 PM   Modules accepted: Orders

## 2018-04-23 ENCOUNTER — Telehealth: Payer: Self-pay | Admitting: Family Medicine

## 2018-04-23 NOTE — Telephone Encounter (Signed)
Pt dropped off receipt for medicine that you requested per pt .Marland Kitchen Please call pt for further details

## 2018-05-01 ENCOUNTER — Telehealth: Payer: Self-pay | Admitting: Pharmacist

## 2018-05-01 DIAGNOSIS — R69 Illness, unspecified: Secondary | ICD-10-CM | POA: Diagnosis not present

## 2018-05-01 NOTE — Telephone Encounter (Signed)
Pt called stating that he rec'd Jardiance in the mail but has not rec'd basaglar yet. Application for Nancee Liter was faxed to [place] on 04/25/18. Returned patient's call however no answer. Left HIPAA-compliant VM requesting he return my call.   [name], Pharm.D., BCPS PGY2 Ambulatory Care Pharmacy Resident Phone: [phone]

## 2018-05-25 ENCOUNTER — Telehealth: Payer: Self-pay | Admitting: Pharmacist

## 2018-05-25 NOTE — Telephone Encounter (Signed)
Patient approved for basaglar via [employer] effective 05/22/18 through 12/04/2018. Patient notified. Product should be shipped to doctor's office next week.   [name], Pharm.D., BCPS PGY2 Ambulatory Care Pharmacy Resident Phone: [phone]

## 2018-05-31 ENCOUNTER — Telehealth: Payer: Self-pay

## 2018-05-31 NOTE — Telephone Encounter (Signed)
Received patients basaglar, informed patient it is ready to be picked up.

## 2018-07-09 ENCOUNTER — Encounter: Payer: Self-pay | Admitting: Family Medicine

## 2018-07-09 ENCOUNTER — Ambulatory Visit (INDEPENDENT_AMBULATORY_CARE_PROVIDER_SITE_OTHER): Payer: Medicare HMO | Admitting: Family Medicine

## 2018-07-09 VITALS — BP 110/62 | HR 94 | Temp 97.6°F | Ht 71.0 in | Wt 175.2 lb

## 2018-07-09 DIAGNOSIS — Z1159 Encounter for screening for other viral diseases: Secondary | ICD-10-CM | POA: Diagnosis not present

## 2018-07-09 DIAGNOSIS — E1142 Type 2 diabetes mellitus with diabetic polyneuropathy: Secondary | ICD-10-CM

## 2018-07-09 DIAGNOSIS — I10 Essential (primary) hypertension: Secondary | ICD-10-CM

## 2018-07-09 DIAGNOSIS — R5383 Other fatigue: Secondary | ICD-10-CM | POA: Diagnosis not present

## 2018-07-09 DIAGNOSIS — E785 Hyperlipidemia, unspecified: Secondary | ICD-10-CM | POA: Diagnosis not present

## 2018-07-09 DIAGNOSIS — E1169 Type 2 diabetes mellitus with other specified complication: Secondary | ICD-10-CM

## 2018-07-09 LAB — TSH: TSH: 4.26 u[IU]/mL (ref 0.35–4.50)

## 2018-07-09 LAB — BASIC METABOLIC PANEL
BUN: 19 mg/dL (ref 6–23)
CALCIUM: 9.7 mg/dL (ref 8.4–10.5)
CO2: 23 meq/L (ref 19–32)
Chloride: 100 mEq/L (ref 96–112)
Creatinine, Ser: 1.36 mg/dL (ref 0.40–1.50)
GFR: 55.31 mL/min — ABNORMAL LOW (ref >60.00)
GLUCOSE: 203 mg/dL — AB (ref 70–99)
POTASSIUM: 4.1 meq/L (ref 3.5–5.1)
Sodium: 133 mEq/L — ABNORMAL LOW (ref 135–145)

## 2018-07-09 NOTE — Progress Notes (Signed)
  Tommi Rumps, MD Phone: [phone]  Richard Gordon is a 67 y.o. male who presents today for f/u.  CC: htn, hld, dm, decreased energy  HYPERTENSION Disease Monitoring: Blood pressure range-not checking Chest pain- no      Dyspnea- no Medications: Compliance- taking lisinopril, HCTZ    Edema- no  DIABETES Disease Monitoring: Blood Sugar ranges-140-170 Polyuria/phagia/dipsia- no      Optho- UTD Medications: Compliance- taking basaglar, jardiance, metformin Hypoglycemic symptoms- no  HYPERLIPIDEMIA Disease Monitoring: See symptoms for Hypertension Medications: Compliance- taking simvastatin Right upper quadrant pain- no  Muscle aches- no  Patient does note since it has been hot outside he has had somewhat decreased energy just when he goes outside with the sun out.  He physically can do what ever he wants but his energy level is not what it would be at other times.  If the sun is not out he can go out and do what ever he wants  No moist skin.  No history of thyroid issues.  Recent B12 in the normal range.   Social History   Tobacco Use  Smoking Status Former Smoker  . Packs/day: 0.50  . Years: 25.00  . Pack years: 12.50  . Types: Cigarettes  . Last attempt to quit: 04/04/1999  . Years since quitting: 19.2  Smokeless Tobacco Former Systems developer  . Types: Snuff     ROS see history of present illness  Objective  Physical Exam Vitals:   07/09/18 1311  BP: 110/62  Pulse: 94  Temp: 97.6 F (36.4 C)  SpO2: 96%    BP Readings from Last 3 Encounters:  07/09/18 110/62  04/03/18 135/84  03/09/18 130/70   Wt Readings from Last 3 Encounters:  07/09/18 175 lb 3.2 oz (79.5 kg)  04/03/18 182 lb (82.6 kg)  03/09/18 180 lb (81.6 kg)    Physical Exam  Constitutional: No distress.  Neck: Neck supple. No thyromegaly present.  Cardiovascular: Normal rate, regular rhythm and normal heart sounds.  Pulmonary/Chest: Effort normal and breath sounds normal.  Musculoskeletal: He  exhibits no edema.  Neurological: He is alert.  Skin: Skin is warm and dry. He is not diaphoretic.     Assessment/Plan: Please see individual problem list.  Hypertension Controlled.  Continue current regimen.  Check BMP.  DM type 2 with diabetic peripheral neuropathy Check A1c.  Continue current regimen.  Hyperlipidemia associated with type 2 diabetes mellitus ([place]) Well-controlled on recent check.  Continue simvastatin.  Decreased energy I suspect this is related to the heat.  He has no chest pain or shortness of breath to indicate a cardiac or pulmonary cause.  We will check thyroid function.   Health Maintenance: Hepatitis C antibody ordered.  Patient has declined colon cancer screening.  I discussed the risk of not undergoing colon cancer screening. Patient verbalized understanding.   Orders Placed This Encounter  Procedures  . Hepatitis C Antibody  . TSH  . Basic Metabolic Panel (BMET)    No orders of the defined types were placed in this encounter.    Tommi Rumps, MD [place]

## 2018-07-09 NOTE — Patient Instructions (Signed)
Nice to see you. We will check lab work today and contact you with the results.

## 2018-07-09 NOTE — Assessment & Plan Note (Signed)
Well-controlled on recent check.  Continue simvastatin.

## 2018-07-09 NOTE — Assessment & Plan Note (Signed)
Controlled.  Continue current regimen.  Check BMP.

## 2018-07-09 NOTE — Assessment & Plan Note (Signed)
Check A1c.  Continue current regimen.

## 2018-07-09 NOTE — Assessment & Plan Note (Signed)
I suspect this is related to the heat.  He has no chest pain or shortness of breath to indicate a cardiac or pulmonary cause.  We will check thyroid function.

## 2018-07-10 ENCOUNTER — Telehealth: Payer: Self-pay

## 2018-07-10 DIAGNOSIS — E871 Hypo-osmolality and hyponatremia: Secondary | ICD-10-CM

## 2018-07-10 LAB — HEPATITIS C ANTIBODY
HEP C AB: NONREACTIVE
SIGNAL TO CUT-OFF: 0.01 (ref <1.00)

## 2018-07-10 NOTE — Telephone Encounter (Signed)
-----  Message from Leone Haven, MD sent at 07/10/2018 10:29 AM EDT ----- please let the patient know that his sodium is mildly low. This could be related to one of his blood pressure medications. I would like to recheck this in one week. Please place an order for a BMP for hyponatremia. His other lab work revealed stable kidney function and elevated glucose though is otherwise acceptable.

## 2018-07-15 NOTE — Progress Notes (Signed)
[facility]  [phone] [phone]  ID: Richard Gordon OB: [birth year]  MR#: [id number]  VOJ#:[id number]  Patient Care Team: Leone Haven, MD as PCP - General (Family Medicine)  CHIEF COMPLAINT: Thrombocytopenia  INTERVAL HISTORY: Patient returns to clinic today for repeat laboratory work and routine evaluation.  He continues to feel well and remains asymptomatic.  He denies any easy bleeding or bruising.  He has no neurologic complaints.  He denies any recent fevers or illnesses. He has a good appetite and denies weight loss.  He has no chest pain or shortness of breath.  He denies any nausea, vomiting, constipation, or diarrhea.  He has no urinary complaints.  Patient offers no specific complaints today.  REVIEW OF SYSTEMS:   Review of Systems  Constitutional: Negative.  Negative for fever, malaise/fatigue and weight loss.  Respiratory: Negative.  Negative for cough and shortness of breath.   Cardiovascular: Negative.  Negative for chest pain and leg swelling.  Gastrointestinal: Negative.  Negative for abdominal pain and constipation.  Genitourinary: Negative.  Negative for dysuria.  Musculoskeletal: Negative.   Skin: Negative.  Negative for rash.  Neurological: Negative.  Negative for sensory change, focal weakness and weakness.  Psychiatric/Behavioral: Negative.  The patient is not nervous/anxious.    As per HPI. Otherwise, a complete review of systems is negative.  PAST MEDICAL HISTORY: Past Medical History:  Diagnosis Date  . Chronic kidney disease (CKD), stage III (moderate) ([place]) 11/03/2015  . Diabetes mellitus without complication ([place])   . Hyperlipidemia   . Hypertension   . Stroke [facility])    per pt 2 CVA in 2000,and 1998 (  R ,then L side effected seperately per pt )    PAST SURGICAL HISTORY: Past Surgical History:  Procedure Laterality Date  . CATARACT EXTRACTION    . TONSILLECTOMY      FAMILY HISTORY: Family History    Problem Relation Age of Onset  . Hypertension Father   . Diabetes Father   . Brain cancer Mother        per pt he was 72 and thinks this was cancer   . Coronary artery disease Sister        scarlet fever as child affected heart per pt    ADVANCED DIRECTIVES (Y/N):  N  HEALTH MAINTENANCE: Social History   Tobacco Use  . Smoking status: Former Smoker    Packs/day: 0.50    Years: 25.00    Pack years: 12.50    Types: Cigarettes    Last attempt to quit: 04/04/1999    Years since quitting: 19.3  . Smokeless tobacco: Former Systems developer    Types: Snuff  Substance Use Topics  . Alcohol use: No    Alcohol/week: 0.0 standard drinks  . Drug use: No     Colonoscopy:  PAP:  Bone density:  Lipid panel:  No Known Allergies  Current Outpatient Medications  Medication Sig Dispense Refill  . aspirin EC 81 MG tablet Take 1 tablet (81 mg total) by mouth daily.    . blood glucose meter kit and supplies KIT Accu chek. Check twice daily. E11.42. 1 each 0  . empagliflozin (JARDIANCE) 10 MG TABS tablet Take 10 mg by mouth daily. 90 tablet 3  . glucose blood (ONETOUCH VERIO) test strip Use as instructed 100 each 12  . hydrochlorothiazide (HYDRODIURIL) 12.5 MG tablet Take 1 tablet (12.5 mg total) by mouth daily. 90 tablet 1  . Insulin Glargine (BASAGLAR KWIKPEN) 100 UNIT/ML SOPN Inject  0.42 mLs (42 Units total) into the skin at bedtime. 5 pen 3  . Insulin Pen Needle 31G X 5 MM MISC 1 each by Does not apply route daily. 100 each 3  . Lancets (ACCU-CHEK SOFT TOUCH) lancets Check blood sugar twice daily. DX: E11.42.  Quantity Sufficient 90 day supply 300 each 3  . lisinopril (PRINIVIL,ZESTRIL) 20 MG tablet Take 1 tablet (20 mg total) by mouth daily. 90 tablet 1  . metFORMIN (GLUCOPHAGE) 1000 MG tablet Take 1 tablet (1,000 mg total) by mouth 2 (two) times daily. (Patient taking differently: Take 1,000 mg by mouth daily with breakfast. ) 180 tablet 3  . simvastatin (ZOCOR) 5 MG tablet Take 1 tablet (5 mg  total) by mouth daily at 6 PM. 90 tablet 3   No current facility-administered medications for this visit.     OBJECTIVE: Vitals:   07/16/18 1122  BP: 130/72  Pulse: 74  Resp: 18  Temp: (!) 96.9 F (36.1 C)     Body mass index is 24.24 kg/m.    ECOG FS:0 - Asymptomatic  General: Well-developed, well-nourished, no acute distress. Eyes: Pink conjunctiva, anicteric sclera. HEENT: Normocephalic, moist mucous membranes. Lungs: Clear to auscultation bilaterally. Heart: Regular rate and rhythm. No rubs, murmurs, or gallops. Abdomen: Soft, nontender, nondistended. No organomegaly noted, normoactive bowel sounds. Musculoskeletal: No edema, cyanosis, or clubbing. Neuro: Alert, answering all questions appropriately. Cranial nerves grossly intact. Skin: No rashes or petechiae noted. Psych: Normal affect.  LAB RESULTS:  Lab Results  Component Value Date   NA 133 (L) 07/09/2018   K 4.1 07/09/2018   CL 100 07/09/2018   CO2 23 07/09/2018   GLUCOSE 203 (H) 07/09/2018   BUN 19 07/09/2018   CREATININE 1.36 07/09/2018   CALCIUM 9.7 07/09/2018   PROT 7.5 02/26/2018   ALBUMIN 4.0 02/26/2018   AST 13 02/26/2018   ALT 10 02/26/2018   ALKPHOS 58 02/26/2018   BILITOT 0.6 02/26/2018   GFRNONAA 41 (L) 01/08/2018   GFRAA 48 (L) 01/08/2018    Lab Results  Component Value Date   WBC 7.0 07/16/2018   NEUTROABS 3.9 07/16/2018   HGB 15.5 07/16/2018   HCT 44.7 07/16/2018   MCV 96.0 07/16/2018   PLT 114 (L) 07/16/2018     STUDIES: No results found.  ASSESSMENT: Thrombocytopenia  PLAN:    1. Thrombocytopenia: Patient's platelet count remains persistently decreased at 114, but essentially stable.  Previously,all of his other laboratory work including iron stores, folate, B12, and SPEP were negative or within normal limits.  Platelet antibodies are also negative.  Patient may have ITP, but this would take a bone marrow biopsy to diagnose which is unnecessary at this point.  No intervention  is needed.  Return to clinic in 6 months with repeat laboratory work and further evaluation.  I spent a total of 20 minutes face-to-face with the patient of which greater than 50% of the visit was spent in counseling and coordination of care as detailed above.   Patient expressed understanding and was in agreement with this plan. He also understands that He can call clinic at any time with any questions, concerns, or complaints.    Lloyd Huger, MD   07/18/2018 1:37 PM

## 2018-07-16 ENCOUNTER — Encounter: Payer: Self-pay | Admitting: Oncology

## 2018-07-16 ENCOUNTER — Inpatient Hospital Stay (HOSPITAL_BASED_OUTPATIENT_CLINIC_OR_DEPARTMENT_OTHER): Payer: Medicare HMO | Admitting: Oncology

## 2018-07-16 ENCOUNTER — Inpatient Hospital Stay: Payer: Medicare HMO | Attending: Oncology

## 2018-07-16 VITALS — BP 130/72 | HR 74 | Temp 96.9°F | Resp 18 | Wt 173.8 lb

## 2018-07-16 DIAGNOSIS — N183 Chronic kidney disease, stage 3 (moderate): Secondary | ICD-10-CM

## 2018-07-16 DIAGNOSIS — D696 Thrombocytopenia, unspecified: Secondary | ICD-10-CM

## 2018-07-16 DIAGNOSIS — I129 Hypertensive chronic kidney disease with stage 1 through stage 4 chronic kidney disease, or unspecified chronic kidney disease: Secondary | ICD-10-CM | POA: Insufficient documentation

## 2018-07-16 DIAGNOSIS — E1122 Type 2 diabetes mellitus with diabetic chronic kidney disease: Secondary | ICD-10-CM | POA: Diagnosis not present

## 2018-07-16 LAB — CBC WITH DIFFERENTIAL/PLATELET
BASOS PCT: 1 %
Basophils Absolute: 0.1 10*3/uL (ref 0–0.1)
Eosinophils Absolute: 0 10*3/uL (ref 0–0.7)
Eosinophils Relative: 0 %
HEMATOCRIT: 44.7 % (ref 40.0–52.0)
HEMOGLOBIN: 15.5 g/dL (ref 13.0–18.0)
LYMPHS ABS: 1.5 10*3/uL (ref 1.0–3.6)
Lymphocytes Relative: 21 %
MCH: 33.2 pg (ref 26.0–34.0)
MCHC: 34.6 g/dL (ref 32.0–36.0)
MCV: 96 fL (ref 80.0–100.0)
Monocytes Absolute: 1.5 10*3/uL — ABNORMAL HIGH (ref 0.2–1.0)
Monocytes Relative: 22 %
NEUTROS ABS: 3.9 10*3/uL (ref 1.4–6.5)
NEUTROS PCT: 56 %
Platelets: 114 10*3/uL — ABNORMAL LOW (ref 150–440)
RBC: 4.65 MIL/uL (ref 4.40–5.90)
RDW: 13.8 % (ref 11.5–14.5)
WBC: 7 10*3/uL (ref 3.8–10.6)

## 2018-07-16 NOTE — Progress Notes (Signed)
Patient denies any concerns today.

## 2018-07-17 ENCOUNTER — Other Ambulatory Visit: Payer: Medicare HMO

## 2018-07-19 ENCOUNTER — Other Ambulatory Visit: Payer: Self-pay | Admitting: Family Medicine

## 2018-07-19 DIAGNOSIS — I10 Essential (primary) hypertension: Secondary | ICD-10-CM

## 2018-07-20 ENCOUNTER — Other Ambulatory Visit: Payer: Self-pay

## 2018-07-20 DIAGNOSIS — E1142 Type 2 diabetes mellitus with diabetic polyneuropathy: Secondary | ICD-10-CM

## 2018-07-20 DIAGNOSIS — R69 Illness, unspecified: Secondary | ICD-10-CM | POA: Diagnosis not present

## 2018-07-20 MED ORDER — GLUCOSE BLOOD VI STRP: ORAL_STRIP | 12 refills | Status: DC

## 2018-07-20 MED ORDER — INSULIN PEN NEEDLE 31G X 5 MM MISC: 1.0000 | Freq: Every day | 3 refills | Status: DC

## 2018-07-20 NOTE — Telephone Encounter (Signed)
Patient called, left VM to return call to the office to schedule a lab appointment to have sodium level checked as noted by Dr. Caryl Bis below.

## 2018-07-20 NOTE — Telephone Encounter (Signed)
Left message to return call, ok for pec to schedule patient for a lab appointment, order placed

## 2018-07-20 NOTE — Telephone Encounter (Signed)
Last OV 07/09/18 last filled by Dr.Cook 07/10/17 90 1rf

## 2018-07-20 NOTE — Telephone Encounter (Signed)
Refill sent to pharmacy.  Patient was supposed to follow-up for repeat labs to check his sodium again.  Please contact him to get this set up.

## 2018-07-20 NOTE — Telephone Encounter (Signed)
Last OV 07/09/18 last filled by Dr.Cook 08/11/17 100 3rf

## 2018-07-25 ENCOUNTER — Telehealth: Payer: Self-pay | Admitting: Pharmacist

## 2018-07-25 NOTE — Telephone Encounter (Signed)
Contacted [employer] on behalf of patient. They noted that patient was approved for a temporary fill, but was not fully approved as he met criteria for Medicare Low Income Subsidy.   Per 04/09/2018 visit note with Deirdre Pippins, PharmD, patient's mail order pharmacy confirmed that he had Level 4 LIS. He will pay 15% for the cost of prescription medications until OOP up to $5,100 out of pocket, then will pay $3.40 for generic and $8.50 for brand name medications in 2019.    Left patient a HIPAA compliant voicemail to return my call.   Catie Darnelle Maffucci, PharmD PGY2 Ambulatory Care Pharmacy Resident Phone: [phone]

## 2018-07-25 NOTE — Telephone Encounter (Signed)
Received call from patient regarding Jardiance patient assistance. He states that he attempted to re-order Jardiance from [facility], and was informed that he was only approved for a temporary 90 day supply. He states that they are "sending him a form" that he needs to fill out.   He states that he has ~ 8 pills at home.   Plan:  - Will call [employer] PAP to find out about temporary hold on shipping - Will route note to Thurmond Butts, CMA to ask to pull 2 bottles of Jardiance 10 mg samples for patient to pick up at the clinic.   Catie Darnelle Maffucci, PharmD PGY2 Ambulatory Care Pharmacy Resident Phone: [phone]

## 2018-07-25 NOTE — Telephone Encounter (Signed)
Done and labeled. Ready for pick up.

## 2018-07-27 NOTE — Telephone Encounter (Signed)
Left message to return call, ok for pec to schedule patient for a lab appointment, order placed

## 2018-07-30 ENCOUNTER — Encounter: Payer: Self-pay | Admitting: Pharmacist

## 2018-07-30 ENCOUNTER — Ambulatory Visit (INDEPENDENT_AMBULATORY_CARE_PROVIDER_SITE_OTHER): Payer: Medicare HMO | Admitting: Pharmacist

## 2018-07-30 VITALS — BP 111/69 | HR 77 | Ht 71.0 in | Wt 174.8 lb

## 2018-07-30 DIAGNOSIS — E1142 Type 2 diabetes mellitus with diabetic polyneuropathy: Secondary | ICD-10-CM | POA: Diagnosis not present

## 2018-07-30 DIAGNOSIS — E1169 Type 2 diabetes mellitus with other specified complication: Secondary | ICD-10-CM

## 2018-07-30 DIAGNOSIS — E785 Hyperlipidemia, unspecified: Secondary | ICD-10-CM | POA: Diagnosis not present

## 2018-07-30 LAB — POCT GLYCOSYLATED HEMOGLOBIN (HGB A1C): Hemoglobin A1C: 6.7 % — AB (ref 4.0–5.6)

## 2018-07-30 MED ORDER — EMPAGLIFLOZIN 10 MG PO TABS: 10.0000 mg | ORAL_TABLET | Freq: Every day | ORAL | 3 refills | Status: DC

## 2018-07-30 NOTE — Assessment & Plan Note (Signed)
#  Diabetes longstanding currently well controlled as evidenced by A1c 6.7% today in clinic. Patient denies hypoglycemic events. Patient reports adherence with medication. Ability to control diabetes in the future may be hindered by medication access. Renal function is appropriate for continuation of current therapy - Continue metformin 2000 mg daily, Basaglar 42 units daily, and Jardiance 10 mg daily - Counseled patient to contact clinic if fasting SMBG consistently <100 mg/dL or episodes of symptomatic hypoglycemia occur; if that happens, appropriate to decrease Basaglar dose - Next A1C anticipated 10/2018 - Submitted appeal for Jardiance to [employer]. Will f/u in 2-3 weeks. Medication samples are currently available in the clinic, if needed.

## 2018-07-30 NOTE — Patient Instructions (Addendum)
It was great to meet you today!  EXCELLENT work with your diabetes! Your A1c today was 6.7%, which is fantastic! Continue metformin 2000 mg daily, Jardiance 10 mg daily, and Basaglar 42 units daily.   If you start to experience more morning fasting blood sugars less than 100, give the clinic a call. We may need to decrease your basaglar dose.    A simvastatin 5 mg tablet was previously called in to the pharmacy. Contact them and ask them to fill this medication for you, instead of the higher strengths.  Your [employer] log in information is:   Username: [phone] Password: [email or web address]     Follow up with Dr. Caryl Bis as scheduled, and contact me if needed!  Catie Darnelle Maffucci, PharmD

## 2018-07-30 NOTE — Assessment & Plan Note (Signed)
#  ASCVD risk - primary prevention in patient aged 68-75 with DM, elevated ASCVD risk >20%. High intensity statin is indicated if tolerated. Most recent LDL very well controlled at 38 on very low dose simvastatin. Appropriate to continue current therapy at this time. - Continued Aspirin 81 mg  - Continued simvastatin 5 mg.

## 2018-07-30 NOTE — Progress Notes (Signed)
S:     Chief Complaint  Patient presents with  . Medication Management    Patient Assistance, Diabetes    Patient arrives in good spirits, ambulating without assistance.  Presents for diabetes evaluation, education, and management at the request of Dr. Biagio Quint. Last seen by primary care provider on 07/09/2018 - at that time no changes were made. Last Rx Clinic visit on 04/09/2018 for patient assistance application. It was determined that patient has LIS Category 4, and Basaglar was applied for from [employer], and [place] through [employer]. Basaglar has been approved through 12/04/2018.  Patient contacted me last week stating that [place] required LIS denial for further assistance with Jardiance. We scheduled this appointment to f/u on diabetes control and send in an appeal to [facility].   Insurance coverage/medication affordability: Airline pilot w/ LIS Category 4 (15% for the cost of prescription medications until OOP up to $5,100 out of pocket, then will pay $3.40 for generic and $8.50 for brand name medications in 2019); Basaglar through [employer]   Patient reports adherence with medications.  Current diabetes medications include: Metformin 2000 mg daily, Basaglar 42 units daily, Jardiance 10 mg daily Current hypertension medications include: lisinopril 20 mg daily, HCTZ 12.5 mg daily (both in the morning)  Patient denies hypoglycemic events.  O:  Physical Exam  Constitutional: He appears well-developed and well-nourished.  Vitals reviewed.   Review of Systems  All other systems reviewed and are negative.   Lab Results  Component Value Date   HGBA1C 6.7 (A) 07/30/2018   Vitals:   07/30/18 0926  BP: 111/69  Pulse: 77  SpO2: 95%   BMP Latest Ref Rng & Units 07/09/2018 02/26/2018 01/08/2018  Glucose 70 - 99 mg/dL 203(H) 184(H) 228(H)  BUN 6 - 23 mg/dL 19 18 26(H)  Creatinine 0.40 - 1.50 mg/dL 1.36 1.35 1.64(H)  BUN/Creat Ratio 10 - 22 - - -  Sodium 135 - 145  mEq/L 133(L) 135 135  Potassium 3.5 - 5.1 mEq/L 4.1 4.2 4.0  Chloride 96 - 112 mEq/L 100 101 102  CO2 19 - 32 mEq/L _0 Calcium 8.4 - 10.5 mg/dL 9.7 9.6 8.5(L)   CrCl ~ 55 mL/min, eGFR 55 mL/min/1.57m  Lipid Panel     Component Value Date/Time   CHOL 105 02/26/2018 1021   CHOL 109 02/05/2016 0903   TRIG 255.0 (H) 02/26/2018 1021   HDL 23.70 (L) 02/26/2018 1021   HDL 15 (L) 02/05/2016 0903   CHOLHDL 4 02/26/2018 1021   VLDL 51.0 (H) 02/26/2018 1021   LDLCALC 40 08/15/2016 1342   LDLCALC 17 02/05/2016 0903   LDLDIRECT 38.0 02/26/2018 1021    Home fasting CBG: Reports range 88-150, though only one reading <90 recently.   Clinical ASCVD: No ; ASCVD risk factors: age 68-75 10year ASCVD risk: >20%  A/P:  Following discussion and approval by Dr. SCaryl Bis the following medication changes were made:   #Diabetes longstanding currently well controlled as evidenced by A1c 6.7% today in clinic. Patient denies hypoglycemic events. Patient reports adherence with medication. Ability to control diabetes in the future may be hindered by medication access. Renal function is appropriate for continuation of current therapy - Continue metformin 2000 mg daily, Basaglar 42 units daily, and Jardiance 10 mg daily - Counseled patient to contact clinic if fasting SMBG consistently <100 mg/dL or episodes of symptomatic hypoglycemia occur; if that happens, appropriate to decrease Basaglar dose - Next A1C anticipated 10/2018 -  Submitted appeal for Jardiance to [employer]. Will f/u in 2-3 weeks. Medication samples are currently available in the clinic, if needed.   #ASCVD risk - primary prevention in patient aged 64-75 with DM, elevated ASCVD risk >20%. High intensity statin is indicated if tolerated. Most recent LDL very well controlled at 38 on very low dose simvastatin. Appropriate to continue current therapy at this time. - Continued Aspirin 81 mg  - Continued simvastatin 5 mg.    Written patient instructions provided.  Total time in face to face counseling 45 minutes.    Follow up with PCP Dr. Caryl Bis as scheduled; follow up with Pharmacy as needed.  De Hollingshead, PharmD PGY2 Ambulatory Care Pharmacy Resident Phone: [phone]

## 2018-08-02 NOTE — Progress Notes (Signed)
I have reviewed the above note and agree. I saw the patient with the clinical pharmacist.  Tommi Rumps, MD

## 2018-08-03 ENCOUNTER — Telehealth: Payer: Self-pay | Admitting: Pharmacist

## 2018-08-03 NOTE — Telephone Encounter (Signed)
Contacted [employer]. Patient has been approved for Jardiance assistance through 12/04/2018. They noted that his first supply is set to ship on 08/07/2018.  Contacted patient and informed him of the news. He asked about resubmitting for calendar year 2020. I informed him that we do not yet know the requirements from each company for 2020, and we will re-evaluate come January.    Catie Darnelle Maffucci, PharmD PGY2 Ambulatory Care Pharmacy Resident Phone: [phone]

## 2018-08-13 NOTE — Telephone Encounter (Signed)
Called patient, left HIPAA compliant message advising that it can take 7-10 business days for the medication to arrive after shipping.   Catie Darnelle Maffucci, PharmD PGY2 Ambulatory Care Pharmacy Resident Phone: [phone]

## 2018-08-13 NOTE — Telephone Encounter (Signed)
Pt came in about his mail order medication for Richard Gordon has not arrived to his home as of yet. Please advise ? Thank you!

## 2018-08-23 ENCOUNTER — Emergency Department
Admission: EM | Admit: 2018-08-23 | Discharge: 2018-08-23 | Disposition: A | Discharge status: Home / Self Care | Payer: Medicare HMO | Attending: Emergency Medicine | Admitting: Emergency Medicine

## 2018-08-23 ENCOUNTER — Other Ambulatory Visit: Payer: Self-pay

## 2018-08-23 ENCOUNTER — Encounter: Payer: Self-pay | Admitting: Emergency Medicine

## 2018-08-23 DIAGNOSIS — E1122 Type 2 diabetes mellitus with diabetic chronic kidney disease: Secondary | ICD-10-CM | POA: Diagnosis not present

## 2018-08-23 DIAGNOSIS — Z794 Long term (current) use of insulin: Secondary | ICD-10-CM | POA: Diagnosis not present

## 2018-08-23 DIAGNOSIS — E860 Dehydration: Secondary | ICD-10-CM | POA: Diagnosis not present

## 2018-08-23 DIAGNOSIS — I129 Hypertensive chronic kidney disease with stage 1 through stage 4 chronic kidney disease, or unspecified chronic kidney disease: Secondary | ICD-10-CM | POA: Diagnosis not present

## 2018-08-23 DIAGNOSIS — M7918 Myalgia, other site: Secondary | ICD-10-CM | POA: Diagnosis present

## 2018-08-23 DIAGNOSIS — R000 Tachycardia, unspecified: Secondary | ICD-10-CM | POA: Diagnosis not present

## 2018-08-23 DIAGNOSIS — R69 Illness, unspecified: Secondary | ICD-10-CM

## 2018-08-23 DIAGNOSIS — J111 Influenza due to unidentified influenza virus with other respiratory manifestations: Secondary | ICD-10-CM

## 2018-08-23 DIAGNOSIS — Z87891 Personal history of nicotine dependence: Secondary | ICD-10-CM | POA: Diagnosis not present

## 2018-08-23 DIAGNOSIS — N183 Chronic kidney disease, stage 3 (moderate): Secondary | ICD-10-CM | POA: Diagnosis not present

## 2018-08-23 LAB — CBC WITH DIFFERENTIAL/PLATELET
BASOS ABS: 0 10*3/uL (ref 0–0.1)
BLASTS: 0 %
Band Neutrophils: 1 %
Basophils Relative: 0 %
Eosinophils Absolute: 0 10*3/uL (ref 0–0.7)
Eosinophils Relative: 0 %
HEMATOCRIT: 38.4 % — AB (ref 40.0–52.0)
HEMOGLOBIN: 13.6 g/dL (ref 13.0–18.0)
Lymphocytes Relative: 10 %
Lymphs Abs: 1.4 10*3/uL (ref 1.0–3.6)
MCH: 33.3 pg (ref 26.0–34.0)
MCHC: 35.4 g/dL (ref 32.0–36.0)
MCV: 94 fL (ref 80.0–100.0)
METAMYELOCYTES PCT: 2 %
MONOS PCT: 20 %
MYELOCYTES: 0 %
Monocytes Absolute: 2.7 10*3/uL — ABNORMAL HIGH (ref 0.2–1.0)
NEUTROS ABS: 9.5 10*3/uL — AB (ref 1.4–6.5)
Neutrophils Relative %: 67 %
Other: 0 %
PROMYELOCYTES RELATIVE: 0 %
Platelets: 112 10*3/uL — ABNORMAL LOW (ref 150–440)
RBC: 4.09 MIL/uL — AB (ref 4.40–5.90)
RDW: 13.6 % (ref 11.5–14.5)
WBC: 13.6 10*3/uL — AB (ref 3.8–10.6)
nRBC: 0 /100 WBC

## 2018-08-23 LAB — COMPREHENSIVE METABOLIC PANEL
ALT: 10 U/L (ref 0–44)
AST: 17 U/L (ref 15–41)
Albumin: 4.1 g/dL (ref 3.5–5.0)
Alkaline Phosphatase: 121 U/L (ref 38–126)
Anion gap: 11 (ref 5–15)
BUN: 29 mg/dL — ABNORMAL HIGH (ref 8–23)
CHLORIDE: 98 mmol/L (ref 98–111)
CO2: 26 mmol/L (ref 22–32)
CREATININE: 1.31 mg/dL — AB (ref 0.61–1.24)
Calcium: 8.9 mg/dL (ref 8.9–10.3)
GFR, EST NON AFRICAN AMERICAN: 54 mL/min — AB (ref >60)
Glucose, Bld: 182 mg/dL — ABNORMAL HIGH (ref 70–99)
Potassium: 4.1 mmol/L (ref 3.5–5.1)
Sodium: 135 mmol/L (ref 135–145)
Total Bilirubin: 0.7 mg/dL (ref 0.3–1.2)
Total Protein: 7.3 g/dL (ref 6.5–8.1)

## 2018-08-23 LAB — INFLUENZA PANEL BY PCR (TYPE A & B)
INFLBPCR: NEGATIVE
Influenza A By PCR: NEGATIVE

## 2018-08-23 LAB — TROPONIN I

## 2018-08-23 LAB — CK: Total CK: 24 U/L — ABNORMAL LOW (ref 49–397)

## 2018-08-23 MED ORDER — IBUPROFEN 600 MG PO TABS
600.0000 mg | ORAL_TABLET | Freq: Once | ORAL | Status: AC
  Administered 2018-08-23: 600 mg via ORAL
  Filled 2018-08-23: qty 1

## 2018-08-23 MED ORDER — SODIUM CHLORIDE 0.9 % IV BOLUS
1000.0000 mL | Freq: Once | INTRAVENOUS | Status: AC
  Administered 2018-08-23: 1000 mL via INTRAVENOUS

## 2018-08-23 MED ORDER — ACETAMINOPHEN 500 MG PO TABS
1000.0000 mg | ORAL_TABLET | Freq: Once | ORAL | Status: AC
  Administered 2018-08-23: 1000 mg via ORAL
  Filled 2018-08-23: qty 2

## 2018-08-23 NOTE — ED Provider Notes (Signed)
[facility] Emergency Department Provider Note  ____________________________________________   First MD Initiated Contact with Patient 08/23/18 [phone]     (approximate)  I have reviewed the triage vital signs and the nursing notes.   HISTORY  Chief Complaint Generalized Body Aches   HPI Richard Gordon is a 67 y.o. male who self presents to the emergency department reporting 3 or 4 days of generalized malaise.  He said "I feel sore all over my body".  He reports no fevers but he says he thinks he may have had some chills.  No cough.  No abdominal pain.  No diarrhea.  Constipation.  His symptoms seem to come on suddenly are mostly constant.  Nothing seems to make them better or worse.  He feels generally "tired".  No sore throat.  No sick contacts.  He has not yet gotten his influenza shot this year.  He has a past medical history of diabetes and hypertension.    Past Medical History:  Diagnosis Date  . Chronic kidney disease (CKD), stage III (moderate) ([place]) 11/03/2015  . Diabetes mellitus without complication ([place])   . Hyperlipidemia   . Hypertension   . Stroke [facility])    per pt 2 CVA in 2000,and 1998 (  R ,then L side effected seperately per pt )    Patient Active Problem List   Diagnosis Date Noted  . Decreased energy 07/09/2018  . Thrombocytopenia ([place]) 04/01/2018  . Syncope 03/09/2018  . AD (atopic dermatitis) 11/03/2015  . Chronic kidney disease (CKD), stage III (moderate) ([place]) 11/03/2015  . Personal history of stroke with residual effects 11/03/2015  . Hyperlipidemia associated with type 2 diabetes mellitus ([place]) 07/09/2015  . Hypertension 07/09/2015  . DM type 2 with diabetic peripheral neuropathy ([place]) 05/22/2015    Past Surgical History:  Procedure Laterality Date  . CATARACT EXTRACTION    . TONSILLECTOMY      Prior to Admission medications   Medication Sig Start Date End Date Taking? Authorizing Provider  aspirin EC 81 MG tablet Take  1 tablet (81 mg total) by mouth daily. 07/04/17   Coral Spikes, DO  blood glucose meter kit and supplies KIT Accu chek. Check twice daily. E11.42. 09/13/17   Leone Haven, MD  empagliflozin (JARDIANCE) 10 MG TABS tablet Take 10 mg by mouth daily. 07/30/18   Leone Haven, MD  glucose blood [facility] VERIO) test strip Check blood sugar twice daily Dx: E11.42 07/20/18   Leone Haven, MD  hydrochlorothiazide (HYDRODIURIL) 12.5 MG tablet TAKE 1 TABLET DAILY 07/20/18   Leone Haven, MD  Insulin Glargine (BASAGLAR KWIKPEN) 100 UNIT/ML SOPN Inject 0.42 mLs (42 Units total) into the skin at bedtime. 12/21/17   Leone Haven, MD  Insulin Pen Needle 31G X 5 MM MISC 1 each by Does not apply route daily. 07/20/18   Leone Haven, MD  Lancets (ACCU-CHEK SOFT TOUCH) lancets Check blood sugar twice daily. DX: E11.42.  Quantity Sufficient 90 day supply 03/27/17   Coral Spikes, DO  lisinopril (PRINIVIL,ZESTRIL) 20 MG tablet Take 1 tablet (20 mg total) by mouth daily. 02/27/18   Leone Haven, MD  metFORMIN (GLUCOPHAGE) 1000 MG tablet Take 1 tablet (1,000 mg total) by mouth 2 (two) times daily. Patient taking differently: Take 1,000 mg by mouth daily with breakfast.  02/27/18   Leone Haven, MD  simvastatin (ZOCOR) 5 MG tablet Take 1 tablet (5 mg total) by mouth daily at 6 PM.  12/19/17   Leone Haven, MD    Allergies Patient has no known allergies.  Family History  Problem Relation Age of Onset  . Hypertension Father   . Diabetes Father   . Brain cancer Mother        per pt he was 63 and thinks this was cancer   . Coronary artery disease Sister        scarlet fever as child affected heart per pt    Social History Social History   Tobacco Use  . Smoking status: Former Smoker    Packs/day: 0.50    Years: 25.00    Pack years: 12.50    Types: Cigarettes    Last attempt to quit: 04/04/1999    Years since quitting: 19.4  . Smokeless tobacco: Former Systems developer     Types: Snuff  Substance Use Topics  . Alcohol use: No    Alcohol/week: 0.0 standard drinks  . Drug use: No    Review of Systems Constitutional: No fever/chills Eyes: No visual changes. ENT: No sore throat. Cardiovascular: Denies chest pain. Respiratory: Denies shortness of breath. Gastrointestinal: No abdominal pain.  No nausea, no vomiting.  No diarrhea.  No constipation. Genitourinary: Negative for dysuria. Musculoskeletal: Negative for back pain. Skin: Negative for rash. Neurological: Negative for headaches, focal weakness or numbness.   ____________________________________________   PHYSICAL EXAM:  VITAL SIGNS: ED Triage Vitals  Enc Vitals Group     BP 08/23/18 0311 (!) 94/57     Pulse Rate 08/23/18 0311 (!) 114     Resp 08/23/18 0311 18     Temp 08/23/18 0311 98.1 F (36.7 C)     Temp Source 08/23/18 0311 Oral     SpO2 08/23/18 0311 96 %     Weight 08/23/18 0308 174 lb (78.9 kg)     Height 08/23/18 0308 5' 10" (1.778 m)     Head Circumference --      Peak Flow --      Pain Score 08/23/18 0308 4     Pain Loc --      Pain Edu? --      Excl. in [place]? --     Constitutional: Alert and oriented x4 appears somewhat uncomfortable although nontoxic no diaphoresis Eyes: PERRL EOMI. Head: Atraumatic. Nose: No congestion/rhinnorhea. Mouth/Throat: No trismus Neck: No stridor.   Cardiovascular: Tachycardic rate, regular rhythm. Grossly normal heart sounds.  Good peripheral circulation. Respiratory: Normal respiratory effort.  No retractions. Lungs CTAB and moving good air Gastrointestinal: Soft nontender Rectal exam guaiac-negative control positive brown stool Musculoskeletal: No lower extremity edema   Neurologic:  Normal speech and language. No gross focal neurologic deficits are appreciated. Skin:  Skin is warm, dry and intact. No rash noted. Psychiatric: Mood and affect are normal. Speech and behavior are normal.    ____________________________________________    DIFFERENTIAL includes but not limited to  Influenza, influenza-like illness, rhabdomyolysis, dehydration, metabolic derangement ____________________________________________   LABS (all labs ordered are listed, but only abnormal results are displayed)  Labs Reviewed  COMPREHENSIVE METABOLIC PANEL - Abnormal; Notable for the following components:      Result Value   Glucose, Bld 182 (*)    BUN 29 (*)    Creatinine, Ser 1.31 (*)    GFR calc non Af Amer 54 (*)    All other components within normal limits  CBC WITH DIFFERENTIAL/PLATELET - Abnormal; Notable for the following components:   WBC 13.6 (*)    RBC 4.09 (*)  HCT 38.4 (*)    Platelets 112 (*)    Neutro Abs 9.5 (*)    Monocytes Absolute 2.7 (*)    All other components within normal limits  CK - Abnormal; Notable for the following components:   Total CK 24 (*)    All other components within normal limits  TROPONIN I  INFLUENZA PANEL BY PCR (TYPE A & B)  URINALYSIS, COMPLETE (UACMP) WITH MICROSCOPIC    Lab work reviewed by me shows chronic kidney disease fluids are negative.  Elevated BUN is concerning for GI bleed __________________________________________  EKG  ED ECG REPORT I, Darel Hong, the attending physician, personally viewed and interpreted this ECG.  Date: 08/23/2018 EKG Time:  Rate: 98 Rhythm: normal sinus rhythm QRS Axis: normal Intervals: normal ST/T Wave abnormalities: normal Narrative Interpretation: no evidence of acute ischemia  ____________________________________________  RADIOLOGY   ____________________________________________   PROCEDURES  Procedure(s) performed: no  Procedures  Critical Care performed: no  ____________________________________________   INITIAL IMPRESSION / ASSESSMENT AND PLAN / ED COURSE  Pertinent labs & imaging results that were available during my care of the patient were reviewed by me and considered in my medical decision making (see chart for  details).   As part of my medical decision making, I reviewed the following data within the [place] History obtained from family if available, nursing notes, old chart and ekg, as well as notes from prior ED visits.  Patient comes to the emergency department with generalized malaise for several days.  Differential is broad but includes influenza versus viral syndrome versus dehydration, rhabdomyolysis etc.  A liter of IV fluids along with Tylenol and ibuprofen are pending.  Lab work is reassuring.  The patient does have an elevated BUN but negative rectal exam which likely represents prerenal azotemia.  He feels improved.  He likely has influenza illness.  Will be discharged home with primary care follow-up.      ____________________________________________   FINAL CLINICAL IMPRESSION(S) / ED DIAGNOSES  Final diagnoses:  Influenza-like illness  Dehydration      NEW MEDICATIONS STARTED DURING THIS VISIT:  Discharge Medication List as of 08/23/2018  5:34 AM       Note:  This document was prepared using Dragon voice recognition software and may include unintentional dictation errors.     Darel Hong, MD 08/23/18 [phone]

## 2018-08-23 NOTE — Discharge Instructions (Signed)
Fortunately today your blood work was very reassuring.  Please make sure you remain well-hydrated and follow-up with your primary care physician this coming Monday as needed.  Return to the emergency department sooner for any concerns whatsoever.  It was a pleasure to take care of you today, and thank you for coming to our emergency department.  If you have any questions or concerns before leaving please ask the nurse to grab me and I'm more than happy to go through your aftercare instructions again.  If you were prescribed any opioid pain medication today such as Norco, Vicodin, Percocet, morphine, hydrocodone, or oxycodone please make sure you do not drive when you are taking this medication as it can alter your ability to drive safely.  If you have any concerns once you are home that you are not improving or are in fact getting worse before you can make it to your follow-up appointment, please do not hesitate to call 911 and come back for further evaluation.  Darel Hong, MD  Results for orders placed or performed during the hospital encounter of 08/23/18  Comprehensive metabolic panel  Result Value Ref Range   Sodium 135 135 - 145 mmol/L   Potassium 4.1 3.5 - 5.1 mmol/L   Chloride 98 98 - 111 mmol/L   CO2 26 22 - 32 mmol/L   Glucose, Bld 182 (H) 70 - 99 mg/dL   BUN 29 (H) 8 - 23 mg/dL   Creatinine, Ser 1.31 (H) 0.61 - 1.24 mg/dL   Calcium 8.9 8.9 - 10.3 mg/dL   Total Protein 7.3 6.5 - 8.1 g/dL   Albumin 4.1 3.5 - 5.0 g/dL   AST 17 15 - 41 U/L   ALT 10 0 - 44 U/L   Alkaline Phosphatase 121 38 - 126 U/L   Total Bilirubin 0.7 0.3 - 1.2 mg/dL   GFR calc non Af Amer 54 (L) >60 mL/min   GFR calc Af Amer >60 >60 mL/min   Anion gap 11 5 - 15  Troponin I  Result Value Ref Range   Troponin I <0.03 <0.03 ng/mL  CBC with Differential  Result Value Ref Range   WBC 13.6 (H) 3.8 - 10.6 K/uL   RBC 4.09 (L) 4.40 - 5.90 MIL/uL   Hemoglobin 13.6 13.0 - 18.0 g/dL   HCT 38.4 (L) 40.0 - 52.0 %    MCV 94.0 80.0 - 100.0 fL   MCH 33.3 26.0 - 34.0 pg   MCHC 35.4 32.0 - 36.0 g/dL   RDW 13.6 11.5 - 14.5 %   Platelets 112 (L) 150 - 440 K/uL   Neutrophils Relative % 67 %   Lymphocytes Relative 10 %   Monocytes Relative 20 %   Eosinophils Relative 0 %   Basophils Relative 0 %   Band Neutrophils 1 %   Metamyelocytes Relative 2 %   Myelocytes 0 %   Promyelocytes Relative 0 %   Blasts 0 %   nRBC 0 0 /100 WBC   Other 0 %   Neutro Abs 9.5 (H) 1.4 - 6.5 K/uL   Lymphs Abs 1.4 1.0 - 3.6 K/uL   Monocytes Absolute 2.7 (H) 0.2 - 1.0 K/uL   Eosinophils Absolute 0.0 0 - 0.7 K/uL   Basophils Absolute 0.0 0 - 0.1 K/uL   RBC Morphology MIXED RBC POPULATION   CK  Result Value Ref Range   Total CK 24 (L) 49 - 397 U/L  Influenza panel by PCR (type A & B)  Result  Value Ref Range   Influenza A By PCR NEGATIVE NEGATIVE   Influenza B By PCR NEGATIVE NEGATIVE

## 2018-08-23 NOTE — ED Triage Notes (Signed)
Patient ambulatory to triage with steady gait, without difficulty or distress noted; pt reports "I feel sore all over"; denies any recent illness or accomp symptoms

## 2018-08-24 ENCOUNTER — Telehealth: Payer: Self-pay | Admitting: Family Medicine

## 2018-08-24 ENCOUNTER — Ambulatory Visit (INDEPENDENT_AMBULATORY_CARE_PROVIDER_SITE_OTHER): Payer: Medicare HMO | Admitting: Family

## 2018-08-24 ENCOUNTER — Encounter: Payer: Self-pay | Admitting: Family

## 2018-08-24 VITALS — BP 134/70 | HR 105 | Temp 98.4°F | Resp 16 | Wt 173.5 lb

## 2018-08-24 DIAGNOSIS — R110 Nausea: Secondary | ICD-10-CM

## 2018-08-24 DIAGNOSIS — M62838 Other muscle spasm: Secondary | ICD-10-CM | POA: Diagnosis not present

## 2018-08-24 MED ORDER — CYCLOBENZAPRINE HCL 5 MG PO TABS: 5.0000 mg | ORAL_TABLET | Freq: Three times a day (TID) | ORAL | 0 refills | Status: DC | PRN

## 2018-08-24 MED ORDER — ONDANSETRON 4 MG PO TBDP: 4.0000 mg | ORAL_TABLET | Freq: Three times a day (TID) | ORAL | 0 refills | Status: DC | PRN

## 2018-08-24 NOTE — Progress Notes (Signed)
Subjective:    Patient ID: Richard Gordon, male    DOB: 1951-04-21, 67 y.o.   MRN: [id number]  CC: Richard Gordon is a 67 y.o. male who presents today for an acute visit.    HPI: CC: chronic left leg pain , years, worsened over past few months.  Today rates leg pain 8/10.   Chronic neuropathy, left thigh since CVA.   No falls. Blew leaves in yard 3 days ago.   Complains of low back pain, which has improved motrin, tyelonol, heat. Leg pain worsen after sleeping in recliner last night. No saddle anesthesia, cancer history.   H/o CVA (2)- 2011, 2007 with left sided residual   No fever. Notes an two episodes of sweating in shirt when it was hot outside. No episodes of chills, sweating today.   Nausea x 3 days. No vomiting or pain with meals.  Non today. No abdominal pain. Would like zofran refilled. Last BM 3 days ago, however ' I havent been eating due to nausea'  Drinks diet tea - not water. Three 16 ounce tea drinks per day.   No vomiting, N, dysuria.   DM- FGB 191 today. Taking basaglar 41. a1c 6.7 07/2018 .   HTN- compliant with medication. Denies exertional chest pain or pressure, numbness or tingling radiating to left arm or jaw, palpitations, dizziness, frequent headaches, changes in vision, or shortness of breath.      ED yesterday: HR 114; suspected influenza like illness. WBCs elevated.   HISTORY:  Past Medical History:  Diagnosis Date  . Chronic kidney disease (CKD), stage III (moderate) ([place]) 11/03/2015  . Diabetes mellitus without complication ([place])   . Hyperlipidemia   . Hypertension   . Stroke [facility])    per pt 2 CVA in 2000,and 1998 (  R ,then L side effected seperately per pt )   Past Surgical History:  Procedure Laterality Date  . CATARACT EXTRACTION    . TONSILLECTOMY     Family History  Problem Relation Age of Onset  . Hypertension Father   . Diabetes Father   . Brain cancer Mother        per pt he was 39 and thinks this was cancer   . Coronary  artery disease Sister        scarlet fever as child affected heart per pt    Allergies: Patient has no known allergies. Current Outpatient Medications on File Prior to Visit  Medication Sig Dispense Refill  . aspirin EC 81 MG tablet Take 1 tablet (81 mg total) by mouth daily.    . blood glucose meter kit and supplies KIT Accu chek. Check twice daily. E11.42. 1 each 0  . empagliflozin (JARDIANCE) 10 MG TABS tablet Take 10 mg by mouth daily. 90 tablet 3  . glucose blood (ONETOUCH VERIO) test strip Check blood sugar twice daily Dx: E11.42 100 each 12  . hydrochlorothiazide (HYDRODIURIL) 12.5 MG tablet TAKE 1 TABLET DAILY 90 tablet 0  . Insulin Glargine (BASAGLAR KWIKPEN) 100 UNIT/ML SOPN Inject 0.42 mLs (42 Units total) into the skin at bedtime. 5 pen 3  . Insulin Pen Needle 31G X 5 MM MISC 1 each by Does not apply route daily. 100 each 3  . Lancets (ACCU-CHEK SOFT TOUCH) lancets Check blood sugar twice daily. DX: E11.42.  Quantity Sufficient 90 day supply 300 each 3  . lisinopril (PRINIVIL,ZESTRIL) 20 MG tablet Take 1 tablet (20 mg total) by mouth daily. 90 tablet 1  . metFORMIN (  GLUCOPHAGE) 1000 MG tablet Take 1 tablet (1,000 mg total) by mouth 2 (two) times daily. (Patient taking differently: Take 1,000 mg by mouth daily with breakfast. ) 180 tablet 3  . simvastatin (ZOCOR) 5 MG tablet Take 1 tablet (5 mg total) by mouth daily at 6 PM. 90 tablet 3   No current facility-administered medications on file prior to visit.     Social History   Tobacco Use  . Smoking status: Former Smoker    Packs/day: 0.50    Years: 25.00    Pack years: 12.50    Types: Cigarettes    Last attempt to quit: 04/04/1999    Years since quitting: 19.4  . Smokeless tobacco: Former Systems developer    Types: Snuff  Substance Use Topics  . Alcohol use: No    Alcohol/week: 0.0 standard drinks  . Drug use: No    Review of Systems  Constitutional: Negative for chills and fever.  Respiratory: Negative for cough.     Cardiovascular: Negative for chest pain and palpitations.  Gastrointestinal: Positive for constipation and nausea. Negative for abdominal pain, diarrhea and vomiting.  Genitourinary: Negative for dysuria and hematuria.      Objective:    BP 134/70 (BP Location: Left Arm, Patient Position: Sitting, Cuff Size: Normal)   Pulse (!) 105   Temp 98.4 F (36.9 C) (Oral)   Resp 16   Wt 173 lb 8 oz (78.7 kg)   SpO2 96%   BMI 24.89 kg/m    Physical Exam  Constitutional: He appears well-developed and well-nourished.  Cardiovascular: Regular rhythm and normal heart sounds. Tachycardia present.  Pulmonary/Chest: Effort normal and breath sounds normal. No respiratory distress. He has no wheezes. He has no rhonchi. He has no rales.  Abdominal: There is no CVA tenderness.  No suprapubic tenderness.   Musculoskeletal:       Lumbar back: He exhibits pain and spasm (as marked on diagram). He exhibits normal range of motion, no tenderness and no swelling.       Back:  Full range of motion with flexion, extension, lateral side bends. No pain, numbness, tingling elicited with single leg raise bilaterally. No rash.  Lymphadenopathy:       Head (left side): No submandibular and no preauricular adenopathy present.  Neurological: He is alert.  Strength: Left lower extremities 4 out of 5.  Right lower extremity 5 out of 5.  Sensation intact over left thigh.  Skin: Skin is warm and dry.  Psychiatric: He has a normal mood and affect. His speech is normal and behavior is normal.  Vitals reviewed.      Assessment & Plan:  1. Muscle spasm Low back and left sided leg pain acute on chronic. Suspect raising HR.  Suspect blowing leaves earlier this week triggered  muscle spasm.  Will give a short course of muscle relaxant, encouraged heat since that has been helpful.  Patient let me know if no improvement as at that point, I  would advise consult with orthopedics. - cyclobenzaprine (FLEXERIL) 5 MG tablet;  Take 1 tablet (5 mg total) by mouth 3 (three) times daily as needed for muscle spasms.  Dispense: 30 tablet; Refill: 0  2. Nausea Benign abdominal exam.  Patient has not been nauseous today.  Explained to patient that nausea at this time  is without etiology.  Suspect pain may be contributory or perhaps patient had viral illness that she was in the ED for and this was a component of that.  Advise close  vigilance and patient let me know if this occurs or new symptoms present. - ondansetron (ZOFRAN ODT) 4 MG disintegrating tablet; Take 1 tablet (4 mg total) by mouth every 8 (eight) hours as needed for nausea or vomiting.  Dispense: 20 tablet; Refill: 0     I am having Cheral Marker. Tietze start on cyclobenzaprine and ondansetron. I am also having him maintain his accu-chek soft touch, aspirin EC, blood glucose meter kit and supplies, BASAGLAR KWIKPEN, simvastatin, metFORMIN, lisinopril, hydrochlorothiazide, Insulin Pen Needle, glucose blood, and empagliflozin.   Meds ordered this encounter  Medications  . cyclobenzaprine (FLEXERIL) 5 MG tablet    Sig: Take 1 tablet (5 mg total) by mouth 3 (three) times daily as needed for muscle spasms.    Dispense:  30 tablet    Refill:  0    Order Specific Question:   Supervising Provider    Answer:   Deborra Medina L [[id number]]  . ondansetron (ZOFRAN ODT) 4 MG disintegrating tablet    Sig: Take 1 tablet (4 mg total) by mouth every 8 (eight) hours as needed for nausea or vomiting.    Dispense:  20 tablet    Refill:  0    Order Specific Question:   Supervising Provider    Answer:   Crecencio Mc [[id number]]    Return precautions given.   Risks, benefits, and alternatives of the medications and treatment plan prescribed today were discussed, and patient expressed understanding.   Education regarding symptom management and diagnosis given to patient on AVS.  Continue to follow with Leone Haven, MD for routine health maintenance.   Cheral Marker [place] and I  agreed with plan.   Mable Paris, FNP

## 2018-08-24 NOTE — Patient Instructions (Addendum)
More water.   Trial of flexeril. HEAT, HEAT, HEAT  Do not drive or operate heavy machinery while on muscle relaxant. Please do not drink alcohol. Only take this medication as needed for acute muscle spasm at bedtime. This medication make you feel drowsy so be very careful.  Stop taking if become too drowsy or somnolent as this puts you at risk for falls. Please contact our office with any questions.    I have given you a few tablets of zofran for the nausea; I am unsure why you are nauseated- perhaps from the back pain.   Please let us know if if symptoms do not improve including nausea, low back pain, or certainly if new symptoms present.  It may be that you would benefit from an orthopedics referral in the future.   Muscle Cramps and Spasms Muscle cramps and spasms occur when a muscle or muscles tighten and you have no control over this tightening (involuntary muscle contraction). They are a common problem and can develop in any muscle. The most common place is in the calf muscles of the leg. Muscle cramps and muscle spasms are both involuntary muscle contractions, but there are some differences between the two:  Muscle cramps are painful. They come and go and may last a few seconds to 15 minutes. Muscle cramps are often more forceful and last longer than muscle spasms.  Muscle spasms may or may not be painful. They may also last just a few seconds or much longer.  Certain medical conditions, such as diabetes or Parkinson disease, can make it more likely to develop cramps or spasms. However, cramps or spasms are usually not caused by a serious underlying problem. Common causes include:  Overexertion.  Overuse from repetitive motions, or doing the same thing over and over.  Remaining in a certain position for a long period of time.  Improper preparation, form, or technique while playing a sport or doing an activity.  Dehydration.  Injury.  Side effects of some  medicines.  Abnormally low levels of the salts and ions in your blood (electrolytes), especially potassium and calcium. This could happen if you are taking water pills (diuretics) or if you are pregnant.  In many cases, the cause of muscle cramps or spasms is unknown. Follow these instructions at home:  Stay well hydrated. Drink enough fluid to keep your urine clear or pale yellow.  Try massaging, stretching, and relaxing the affected muscle.  If directed, apply heat to tight or tense muscles as often as told by your health care provider. Use the heat source that your health care provider recommends, such as a moist heat pack or a heating pad. ? Place a towel between your skin and the heat source. ? Leave the heat on for 20-30 minutes. ? Remove the heat if your skin turns bright red. This is especially important if you are unable to feel pain, heat, or cold. You may have a greater risk of getting burned.  If directed, put ice on the affected area. This may help if you are sore or have pain after a cramp or spasm. ? Put ice in a plastic bag. ? Place a towel between your skin and the bag. ? Leavethe ice on for 20 minutes, 2-3 times a day.  Take over-the-counter and prescription medicines only as told by your health care provider.  Pay attention to any changes in your symptoms. Contact a health care provider if:  Your cramps or spasms get more severe or  happen more often.  Your cramps or spasms do not improve over time. This information is not intended to replace advice given to you by your health care provider. Make sure you discuss any questions you have with your health care provider. Document Released: 05/13/2002 Document Revised: 12/23/2015 Document Reviewed: 08/25/2015 Elsevier Interactive Patient Education  2018 [employer].

## 2018-08-24 NOTE — Telephone Encounter (Signed)
Pt came in wanting to get a Rx for Ondansteron 71m pt was given that medication in ER on 04/03/2018. Please advise? Thank you!  Call pt @ [phone].

## 2018-08-24 NOTE — Addendum Note (Signed)
Addended by: Johna Sheriff on: 08/24/2018 02:03 PM   Modules accepted: Orders

## 2018-08-24 NOTE — Addendum Note (Signed)
Addended by: Johna Sheriff on: 08/24/2018 02:13 PM   Modules accepted: Orders

## 2018-08-28 ENCOUNTER — Telehealth: Payer: Self-pay

## 2018-08-28 NOTE — Progress Notes (Signed)
Telephone encounter created

## 2018-08-28 NOTE — Telephone Encounter (Signed)
noted

## 2018-08-28 NOTE — Telephone Encounter (Signed)
-----  Message from Burnard Hawthorne, Lydia sent at 08/24/2018  1:31 PM EDT ----- Follow up on Monday 9/23 and ensure that constipation, nausea, leg pain has improved. If not, he needs to be seen again

## 2018-08-28 NOTE — Telephone Encounter (Signed)
Left voice mail for patient to call back ok for P[facility] to speak to patient , see below message

## 2018-08-28 NOTE — Telephone Encounter (Signed)
Follow up on Monday 9/23 and ensure that constipation, nausea, leg pain has improved. If not, he needs to be seen again

## 2018-08-28 NOTE — Telephone Encounter (Signed)
FYI

## 2018-08-28 NOTE — Telephone Encounter (Signed)
Pt states he is feeling better.

## 2018-09-06 DIAGNOSIS — R69 Illness, unspecified: Secondary | ICD-10-CM | POA: Diagnosis not present

## 2018-10-06 ENCOUNTER — Other Ambulatory Visit: Payer: Self-pay | Admitting: Family Medicine

## 2018-10-06 DIAGNOSIS — I10 Essential (primary) hypertension: Secondary | ICD-10-CM

## 2018-10-07 ENCOUNTER — Encounter: Payer: Self-pay | Admitting: Intensive Care

## 2018-10-07 ENCOUNTER — Emergency Department
Admission: EM | Admit: 2018-10-07 | Discharge: 2018-10-07 | Disposition: A | Discharge status: Home / Self Care | Payer: Medicare HMO | Source: Home / Self Care | Attending: Emergency Medicine | Admitting: Emergency Medicine

## 2018-10-07 ENCOUNTER — Other Ambulatory Visit: Payer: Self-pay

## 2018-10-07 DIAGNOSIS — Z7982 Long term (current) use of aspirin: Secondary | ICD-10-CM | POA: Insufficient documentation

## 2018-10-07 DIAGNOSIS — Z79899 Other long term (current) drug therapy: Secondary | ICD-10-CM

## 2018-10-07 DIAGNOSIS — I129 Hypertensive chronic kidney disease with stage 1 through stage 4 chronic kidney disease, or unspecified chronic kidney disease: Secondary | ICD-10-CM

## 2018-10-07 DIAGNOSIS — N183 Chronic kidney disease, stage 3 (moderate): Secondary | ICD-10-CM | POA: Insufficient documentation

## 2018-10-07 DIAGNOSIS — A419 Sepsis, unspecified organism: Secondary | ICD-10-CM | POA: Diagnosis not present

## 2018-10-07 DIAGNOSIS — R110 Nausea: Secondary | ICD-10-CM

## 2018-10-07 DIAGNOSIS — M791 Myalgia, unspecified site: Secondary | ICD-10-CM | POA: Diagnosis not present

## 2018-10-07 DIAGNOSIS — Z87891 Personal history of nicotine dependence: Secondary | ICD-10-CM

## 2018-10-07 DIAGNOSIS — R739 Hyperglycemia, unspecified: Secondary | ICD-10-CM

## 2018-10-07 DIAGNOSIS — E1140 Type 2 diabetes mellitus with diabetic neuropathy, unspecified: Secondary | ICD-10-CM

## 2018-10-07 DIAGNOSIS — R000 Tachycardia, unspecified: Secondary | ICD-10-CM | POA: Diagnosis not present

## 2018-10-07 DIAGNOSIS — Z8673 Personal history of transient ischemic attack (TIA), and cerebral infarction without residual deficits: Secondary | ICD-10-CM

## 2018-10-07 DIAGNOSIS — Z794 Long term (current) use of insulin: Secondary | ICD-10-CM

## 2018-10-07 DIAGNOSIS — E1122 Type 2 diabetes mellitus with diabetic chronic kidney disease: Secondary | ICD-10-CM | POA: Insufficient documentation

## 2018-10-07 DIAGNOSIS — E1165 Type 2 diabetes mellitus with hyperglycemia: Secondary | ICD-10-CM | POA: Diagnosis not present

## 2018-10-07 LAB — CBC
HCT: 34.2 % — ABNORMAL LOW (ref 39.0–52.0)
HEMOGLOBIN: 11.1 g/dL — AB (ref 13.0–17.0)
MCH: 30.2 pg (ref 26.0–34.0)
MCHC: 32.5 g/dL (ref 30.0–36.0)
MCV: 92.9 fL (ref 80.0–100.0)
Platelets: 264 10*3/uL (ref 150–400)
RBC: 3.68 MIL/uL — AB (ref 4.22–5.81)
RDW: 14.2 % (ref 11.5–15.5)
WBC: 15.3 10*3/uL — ABNORMAL HIGH (ref 4.0–10.5)
nRBC: 0 % (ref 0.0–0.2)

## 2018-10-07 LAB — URINALYSIS, ROUTINE W REFLEX MICROSCOPIC
BACTERIA UA: NONE SEEN
BILIRUBIN URINE: NEGATIVE
Glucose, UA: >=500 mg/dL — AB
Hgb urine dipstick: NEGATIVE
Ketones, ur: 5 mg/dL — AB
Leukocytes, UA: NEGATIVE
NITRITE: NEGATIVE
PH: 6 (ref 5.0–8.0)
Protein, ur: NEGATIVE mg/dL
SPECIFIC GRAVITY, URINE: 1.02 (ref 1.005–1.030)

## 2018-10-07 LAB — CK: Total CK: 33 U/L — ABNORMAL LOW (ref 49–397)

## 2018-10-07 LAB — COMPREHENSIVE METABOLIC PANEL
ALK PHOS: 229 U/L — AB (ref 38–126)
ALT: 21 U/L (ref 0–44)
AST: 24 U/L (ref 15–41)
Albumin: 3.2 g/dL — ABNORMAL LOW (ref 3.5–5.0)
Anion gap: 13 (ref 5–15)
BUN: 30 mg/dL — AB (ref 8–23)
CO2: 23 mmol/L (ref 22–32)
CREATININE: 1.3 mg/dL — AB (ref 0.61–1.24)
Calcium: 8.6 mg/dL — ABNORMAL LOW (ref 8.9–10.3)
Chloride: 93 mmol/L — ABNORMAL LOW (ref 98–111)
GFR calc Af Amer: >60 mL/min (ref >60)
GFR, EST NON AFRICAN AMERICAN: 55 mL/min — AB (ref >60)
Glucose, Bld: 298 mg/dL — ABNORMAL HIGH (ref 70–99)
Potassium: 4.5 mmol/L (ref 3.5–5.1)
Sodium: 129 mmol/L — ABNORMAL LOW (ref 135–145)
Total Bilirubin: 0.9 mg/dL (ref 0.3–1.2)
Total Protein: 7.4 g/dL (ref 6.5–8.1)

## 2018-10-07 LAB — GLUCOSE, CAPILLARY: GLUCOSE-CAPILLARY: 116 mg/dL — AB (ref 70–99)

## 2018-10-07 MED ORDER — SODIUM CHLORIDE 0.9 % IV BOLUS
1000.0000 mL | Freq: Once | INTRAVENOUS | Status: AC
  Administered 2018-10-07: 1000 mL via INTRAVENOUS

## 2018-10-07 MED ORDER — ONDANSETRON HCL 4 MG/2ML IJ SOLN
4.0000 mg | Freq: Once | INTRAMUSCULAR | Status: AC
  Administered 2018-10-07: 4 mg via INTRAVENOUS
  Filled 2018-10-07: qty 2

## 2018-10-07 NOTE — ED Notes (Signed)
Pt reports to ED with c/c of generalized body pain and decreased appetite starting 6 days ago after having tooth removed at the dentist. Pt states that he has been taking an antibiotic prescribed by dentist. Pt has Hx of diabetes with last dose of insulin last night at 2200. Pt reports his blood sugar this AM was 210. When asked to describe pain, he reports muscle pain throughout his body and cannot specify any focal points. Last meal was yogurt this AM which he states he did not finish because he had no appetite for it.

## 2018-10-07 NOTE — ED Triage Notes (Signed)
Patient reports he is a diabetic and c/o generalized body aches. A&O x4. Ambulatory in triage with no problems

## 2018-10-07 NOTE — ED Provider Notes (Signed)
[facility] Emergency Department Provider Note  Time seen: 8:54 AM  I have reviewed the triage vital signs and the nursing notes.   HISTORY  Chief Complaint Hyperglycemia and Generalized Body Aches    HPI Richard Gordon is a 67 y.o. male with a past medical history of CKD, diabetes, hypertension, hyperlipidemia, presents to the emergency department for body aches and hyperglycemia.  According to the patient on Monday he had an infected tooth extracted, was placed on antibiotics but does not know which one.  He states ever since the tooth was extracted he has been experiencing body aches with intermittent nausea and his blood sugars have been running elevated.  His blood sugar he states is normally around 100 and it has been around 160-170 recently.  Denies any fever.  No chest pain or shortness of breath.  States a very slight cough that just started this morning no sputum production.  No abdominal pain.  He does state nausea intermittent over the past 1 week but denies any vomiting.  No diarrhea.  No dysuria.   Past Medical History:  Diagnosis Date  . Chronic kidney disease (CKD), stage III (moderate) ([place]) 11/03/2015  . Diabetes mellitus without complication ([place])   . Hyperlipidemia   . Hypertension   . Stroke [facility])    per pt 2 CVA in 2000,and 1998 (  R ,then L side effected seperately per pt )    Patient Active Problem List   Diagnosis Date Noted  . Decreased energy 07/09/2018  . Thrombocytopenia ([place]) 04/01/2018  . Syncope 03/09/2018  . AD (atopic dermatitis) 11/03/2015  . Chronic kidney disease (CKD), stage III (moderate) ([place]) 11/03/2015  . Personal history of stroke with residual effects 11/03/2015  . Hyperlipidemia associated with type 2 diabetes mellitus ([facility]) 07/09/2015  . Hypertension 07/09/2015  . DM type 2 with diabetic peripheral neuropathy ([place]) 05/22/2015    Past Surgical History:  Procedure Laterality Date  . CATARACT EXTRACTION    .  TONSILLECTOMY      Prior to Admission medications   Medication Sig Start Date End Date Taking? Authorizing Provider  aspirin EC 81 MG tablet Take 1 tablet (81 mg total) by mouth daily. 07/04/17   Coral Spikes, DO  blood glucose meter kit and supplies KIT Accu chek. Check twice daily. E11.42. 09/13/17   Leone Haven, MD  cyclobenzaprine (FLEXERIL) 5 MG tablet Take 1 tablet (5 mg total) by mouth 3 (three) times daily as needed for muscle spasms. 08/24/18   Burnard Hawthorne, FNP  empagliflozin (JARDIANCE) 10 MG TABS tablet Take 10 mg by mouth daily. 07/30/18   Leone Haven, MD  glucose blood [facility] VERIO) test strip Check blood sugar twice daily Dx: E11.42 07/20/18   Leone Haven, MD  hydrochlorothiazide (HYDRODIURIL) 12.5 MG tablet TAKE 1 TABLET DAILY 07/20/18   Leone Haven, MD  Insulin Glargine (BASAGLAR KWIKPEN) 100 UNIT/ML SOPN Inject 0.42 mLs (42 Units total) into the skin at bedtime. 12/21/17   Leone Haven, MD  Insulin Pen Needle 31G X 5 MM MISC 1 each by Does not apply route daily. 07/20/18   Leone Haven, MD  Lancets (ACCU-CHEK SOFT TOUCH) lancets Check blood sugar twice daily. DX: E11.42.  Quantity Sufficient 90 day supply 03/27/17   Coral Spikes, DO  lisinopril (PRINIVIL,ZESTRIL) 20 MG tablet Take 1 tablet (20 mg total) by mouth daily. 02/27/18   Leone Haven, MD  metFORMIN (GLUCOPHAGE) 1000 MG tablet Take  1 tablet (1,000 mg total) by mouth 2 (two) times daily. Patient taking differently: Take 1,000 mg by mouth daily with breakfast.  02/27/18   Leone Haven, MD  ondansetron (ZOFRAN ODT) 4 MG disintegrating tablet Take 1 tablet (4 mg total) by mouth every 8 (eight) hours as needed for nausea or vomiting. 08/24/18   Burnard Hawthorne, FNP  simvastatin (ZOCOR) 5 MG tablet Take 1 tablet (5 mg total) by mouth daily at 6 PM. 12/19/17   Leone Haven, MD    No Known Allergies  Family History  Problem Relation Age of Onset  . Hypertension  Father   . Diabetes Father   . Brain cancer Mother        per pt he was 31 and thinks this was cancer   . Coronary artery disease Sister        scarlet fever as child affected heart per pt    Social History Social History   Tobacco Use  . Smoking status: Former Smoker    Packs/day: 0.50    Years: 25.00    Pack years: 12.50    Types: Cigarettes    Last attempt to quit: 04/04/1999    Years since quitting: 19.5  . Smokeless tobacco: Former Systems developer    Types: Snuff  Substance Use Topics  . Alcohol use: No    Alcohol/week: 0.0 standard drinks  . Drug use: No    Review of Systems Constitutional: Negative for fever. Cardiovascular: Negative for chest pain. Respiratory: Negative for shortness of breath. Gastrointestinal: Negative for abdominal pain, vomiting and diarrhea.  Positive for nausea. Genitourinary: Negative for urinary compaints Musculoskeletal: Negative for musculoskeletal complaints Skin: Negative for skin complaints  Neurological: Negative for headache All other ROS negative  ____________________________________________   PHYSICAL EXAM:  VITAL SIGNS: ED Triage Vitals  Enc Vitals Group     BP 10/07/18 0833 (!) 113/55     Pulse Rate 10/07/18 0833 (!) 125     Resp 10/07/18 0833 18     Temp 10/07/18 0833 (!) 97.4 F (36.3 C)     Temp Source 10/07/18 0833 Oral     SpO2 10/07/18 0833 97 %     Weight 10/07/18 0835 175 lb (79.4 kg)     Height 10/07/18 0835 5' 10" (1.778 m)     Head Circumference --      Peak Flow --      Pain Score 10/07/18 0834 9     Pain Loc --      Pain Edu? --      Excl. in [place]? --    Constitutional: Alert and oriented. Well appearing and in no distress. Eyes: Normal exam ENT   Head: Normocephalic and atraumatic.   Mouth/Throat: Mucous membranes are moist. Cardiovascular: Regular rhythm, rate around 110 bpm.  No obvious murmur. Respiratory: Normal respiratory effort without tachypnea nor retractions. Breath sounds are clear   Gastrointestinal: Soft and nontender. No distention.  Musculoskeletal: Nontender with normal range of motion in all extremities.  Neurologic:  Normal speech and language. No gross focal neurologic deficits  Skin:  Skin is warm, dry and intact.  Psychiatric: Mood and affect are normal.   ____________________________________________    EKG  EKG reviewed and interpreted by myself shows sinus tachycardia 121 bpm with a narrow QRS, normal axis, normal intervals, no concerning ST changes.  ____________________________________________   INITIAL IMPRESSION / ASSESSMENT AND PLAN / ED COURSE  Pertinent labs & imaging results that were available during my care  of the patient were reviewed by me and considered in my medical decision making (see chart for details).  Patient presents to the emergency department for body aches and elevated blood glucose around 180.  Patient had a tooth pulled 6 days ago and has been on antibiotics since.  We will check labs, IV hydrate, treat nausea and continue to closely monitor.  Overall the patient appears well, no distress.  No chest pain or trouble breathing.  Patient's white blood cell count is 15,000, glucose is 290.  Anion gap is 13, bicarb 23.  Does not appear consistent with DKA.  Patient has received IV fluids we will recheck a fingerstick, I anticipate likely discharge home with oral hydration and PCP follow-up.  ____________________________________________   FINAL CLINICAL IMPRESSION(S) / ED DIAGNOSES  Hyperglycemia Body aches    Harvest Dark, MD 10/07/18 1243

## 2018-10-09 ENCOUNTER — Inpatient Hospital Stay
Admission: EM | Admit: 2018-10-09 | Discharge: 2018-10-14 | DRG: 872 | Disposition: A | Discharge status: Home / Self Care | Payer: Medicare HMO | Attending: Family Medicine | Admitting: Family Medicine

## 2018-10-09 ENCOUNTER — Encounter: Payer: Self-pay | Admitting: Emergency Medicine

## 2018-10-09 ENCOUNTER — Other Ambulatory Visit: Payer: Self-pay

## 2018-10-09 ENCOUNTER — Emergency Department: Payer: Medicare HMO

## 2018-10-09 DIAGNOSIS — D631 Anemia in chronic kidney disease: Secondary | ICD-10-CM | POA: Diagnosis present

## 2018-10-09 DIAGNOSIS — I340 Nonrheumatic mitral (valve) insufficiency: Secondary | ICD-10-CM | POA: Diagnosis not present

## 2018-10-09 DIAGNOSIS — D696 Thrombocytopenia, unspecified: Secondary | ICD-10-CM | POA: Diagnosis present

## 2018-10-09 DIAGNOSIS — E860 Dehydration: Secondary | ICD-10-CM | POA: Diagnosis present

## 2018-10-09 DIAGNOSIS — I129 Hypertensive chronic kidney disease with stage 1 through stage 4 chronic kidney disease, or unspecified chronic kidney disease: Secondary | ICD-10-CM | POA: Diagnosis not present

## 2018-10-09 DIAGNOSIS — I330 Acute and subacute infective endocarditis: Secondary | ICD-10-CM | POA: Diagnosis not present

## 2018-10-09 DIAGNOSIS — E871 Hypo-osmolality and hyponatremia: Secondary | ICD-10-CM | POA: Diagnosis present

## 2018-10-09 DIAGNOSIS — M545 Low back pain: Secondary | ICD-10-CM | POA: Diagnosis not present

## 2018-10-09 DIAGNOSIS — Z79899 Other long term (current) drug therapy: Secondary | ICD-10-CM | POA: Diagnosis not present

## 2018-10-09 DIAGNOSIS — Z794 Long term (current) use of insulin: Secondary | ICD-10-CM

## 2018-10-09 DIAGNOSIS — R748 Abnormal levels of other serum enzymes: Secondary | ICD-10-CM | POA: Diagnosis not present

## 2018-10-09 DIAGNOSIS — Z87891 Personal history of nicotine dependence: Secondary | ICD-10-CM

## 2018-10-09 DIAGNOSIS — E1165 Type 2 diabetes mellitus with hyperglycemia: Secondary | ICD-10-CM | POA: Diagnosis present

## 2018-10-09 DIAGNOSIS — Z8673 Personal history of transient ischemic attack (TIA), and cerebral infarction without residual deficits: Secondary | ICD-10-CM

## 2018-10-09 DIAGNOSIS — R509 Fever, unspecified: Secondary | ICD-10-CM

## 2018-10-09 DIAGNOSIS — Z98818 Other dental procedure status: Secondary | ICD-10-CM | POA: Diagnosis not present

## 2018-10-09 DIAGNOSIS — Z7982 Long term (current) use of aspirin: Secondary | ICD-10-CM

## 2018-10-09 DIAGNOSIS — E785 Hyperlipidemia, unspecified: Secondary | ICD-10-CM | POA: Diagnosis present

## 2018-10-09 DIAGNOSIS — R918 Other nonspecific abnormal finding of lung field: Secondary | ICD-10-CM | POA: Diagnosis not present

## 2018-10-09 DIAGNOSIS — M791 Myalgia, unspecified site: Secondary | ICD-10-CM | POA: Diagnosis not present

## 2018-10-09 DIAGNOSIS — N179 Acute kidney failure, unspecified: Secondary | ICD-10-CM | POA: Diagnosis not present

## 2018-10-09 DIAGNOSIS — Z808 Family history of malignant neoplasm of other organs or systems: Secondary | ICD-10-CM

## 2018-10-09 DIAGNOSIS — A419 Sepsis, unspecified organism: Secondary | ICD-10-CM | POA: Diagnosis not present

## 2018-10-09 DIAGNOSIS — E1142 Type 2 diabetes mellitus with diabetic polyneuropathy: Secondary | ICD-10-CM | POA: Diagnosis present

## 2018-10-09 DIAGNOSIS — N183 Chronic kidney disease, stage 3 (moderate): Secondary | ICD-10-CM | POA: Diagnosis not present

## 2018-10-09 DIAGNOSIS — R52 Pain, unspecified: Secondary | ICD-10-CM | POA: Diagnosis not present

## 2018-10-09 DIAGNOSIS — E119 Type 2 diabetes mellitus without complications: Secondary | ICD-10-CM | POA: Diagnosis not present

## 2018-10-09 DIAGNOSIS — K047 Periapical abscess without sinus: Secondary | ICD-10-CM | POA: Diagnosis not present

## 2018-10-09 DIAGNOSIS — K0889 Other specified disorders of teeth and supporting structures: Secondary | ICD-10-CM | POA: Diagnosis not present

## 2018-10-09 DIAGNOSIS — Z833 Family history of diabetes mellitus: Secondary | ICD-10-CM

## 2018-10-09 DIAGNOSIS — J9811 Atelectasis: Secondary | ICD-10-CM | POA: Diagnosis not present

## 2018-10-09 DIAGNOSIS — R634 Abnormal weight loss: Secondary | ICD-10-CM | POA: Diagnosis not present

## 2018-10-09 DIAGNOSIS — G8929 Other chronic pain: Secondary | ICD-10-CM | POA: Diagnosis not present

## 2018-10-09 DIAGNOSIS — E1122 Type 2 diabetes mellitus with diabetic chronic kidney disease: Secondary | ICD-10-CM | POA: Diagnosis present

## 2018-10-09 DIAGNOSIS — Z8249 Family history of ischemic heart disease and other diseases of the circulatory system: Secondary | ICD-10-CM

## 2018-10-09 DIAGNOSIS — R000 Tachycardia, unspecified: Secondary | ICD-10-CM | POA: Diagnosis not present

## 2018-10-09 DIAGNOSIS — D649 Anemia, unspecified: Secondary | ICD-10-CM | POA: Diagnosis not present

## 2018-10-09 LAB — COMPREHENSIVE METABOLIC PANEL
ALT: 23 U/L (ref 0–44)
ANION GAP: 18 — AB (ref 5–15)
AST: 33 U/L (ref 15–41)
Albumin: 3.3 g/dL — ABNORMAL LOW (ref 3.5–5.0)
Alkaline Phosphatase: 505 U/L — ABNORMAL HIGH (ref 38–126)
BUN: 38 mg/dL — ABNORMAL HIGH (ref 8–23)
CHLORIDE: 92 mmol/L — AB (ref 98–111)
CO2: 20 mmol/L — AB (ref 22–32)
Calcium: 8.7 mg/dL — ABNORMAL LOW (ref 8.9–10.3)
Creatinine, Ser: 1.77 mg/dL — ABNORMAL HIGH (ref 0.61–1.24)
GFR calc Af Amer: 44 mL/min — ABNORMAL LOW (ref >60)
GFR calc non Af Amer: 38 mL/min — ABNORMAL LOW (ref >60)
Glucose, Bld: 215 mg/dL — ABNORMAL HIGH (ref 70–99)
POTASSIUM: 4.5 mmol/L (ref 3.5–5.1)
SODIUM: 130 mmol/L — AB (ref 135–145)
Total Bilirubin: 1.6 mg/dL — ABNORMAL HIGH (ref 0.3–1.2)
Total Protein: 8.1 g/dL (ref 6.5–8.1)

## 2018-10-09 LAB — CBC WITH DIFFERENTIAL/PLATELET
ABS IMMATURE GRANULOCYTES: 1.33 10*3/uL — AB (ref 0.00–0.07)
Basophils Absolute: 0.1 10*3/uL (ref 0.0–0.1)
Basophils Relative: 0 %
Eosinophils Absolute: 0.5 10*3/uL (ref 0.0–0.5)
Eosinophils Relative: 2 %
HCT: 34.3 % — ABNORMAL LOW (ref 39.0–52.0)
Hemoglobin: 11.1 g/dL — ABNORMAL LOW (ref 13.0–17.0)
IMMATURE GRANULOCYTES: 5 %
LYMPHS ABS: 1.3 10*3/uL (ref 0.7–4.0)
Lymphocytes Relative: 5 %
MCH: 30.1 pg (ref 26.0–34.0)
MCHC: 32.4 g/dL (ref 30.0–36.0)
MCV: 93 fL (ref 80.0–100.0)
MONOS PCT: 21 %
Monocytes Absolute: 5.8 10*3/uL — ABNORMAL HIGH (ref 0.1–1.0)
NEUTROS ABS: 18.9 10*3/uL — AB (ref 1.7–7.7)
NEUTROS PCT: 67 %
PLATELETS: 284 10*3/uL (ref 150–400)
RBC: 3.69 MIL/uL — ABNORMAL LOW (ref 4.22–5.81)
RDW: 14.6 % (ref 11.5–15.5)
WBC: 27.9 10*3/uL — ABNORMAL HIGH (ref 4.0–10.5)
nRBC: 0.1 % (ref 0.0–0.2)

## 2018-10-09 LAB — URINALYSIS, COMPLETE (UACMP) WITH MICROSCOPIC
Bacteria, UA: NONE SEEN
Bilirubin Urine: NEGATIVE
Hgb urine dipstick: NEGATIVE
KETONES UR: 20 mg/dL — AB
LEUKOCYTES UA: NEGATIVE
Nitrite: NEGATIVE
PROTEIN: 30 mg/dL — AB
SQUAMOUS EPITHELIAL / LPF: NONE SEEN (ref 0–5)
Specific Gravity, Urine: 1.016 (ref 1.005–1.030)
pH: 5 (ref 5.0–8.0)

## 2018-10-09 LAB — CREATININE, SERUM
Creatinine, Ser: 1.41 mg/dL — ABNORMAL HIGH (ref 0.61–1.24)
GFR calc Af Amer: 58 mL/min — ABNORMAL LOW (ref >60)
GFR, EST NON AFRICAN AMERICAN: 50 mL/min — AB (ref >60)

## 2018-10-09 LAB — CK: Total CK: 41 U/L — ABNORMAL LOW (ref 49–397)

## 2018-10-09 LAB — GLUCOSE, CAPILLARY
GLUCOSE-CAPILLARY: 184 mg/dL — AB (ref 70–99)
GLUCOSE-CAPILLARY: 222 mg/dL — AB (ref 70–99)

## 2018-10-09 LAB — PROCALCITONIN: Procalcitonin: 3.33 ng/mL

## 2018-10-09 LAB — PROTIME-INR
INR: 1.26
PROTHROMBIN TIME: 15.7 s — AB (ref 11.4–15.2)

## 2018-10-09 LAB — LACTIC ACID, PLASMA
LACTIC ACID, VENOUS: 2 mmol/L — AB (ref 0.5–1.9)
Lactic Acid, Venous: 0.9 mmol/L (ref 0.5–1.9)

## 2018-10-09 LAB — APTT: aPTT: 38 seconds — ABNORMAL HIGH (ref 24–36)

## 2018-10-09 MED ORDER — BASAGLAR KWIKPEN 100 UNIT/ML ~~LOC~~ SOPN: 41.0000 [IU] | PEN_INJECTOR | Freq: Every day | SUBCUTANEOUS | Status: DC

## 2018-10-09 MED ORDER — HYDROCODONE-ACETAMINOPHEN 5-325 MG PO TABS: 1.0000 | ORAL_TABLET | ORAL | Status: DC | PRN

## 2018-10-09 MED ORDER — INSULIN ASPART 100 UNIT/ML ~~LOC~~ SOLN
0.0000 [IU] | Freq: Three times a day (TID) | SUBCUTANEOUS | Status: DC
  Administered 2018-10-10 – 2018-10-12 (×6): 1 [IU] via SUBCUTANEOUS
  Administered 2018-10-13 (×2): 2 [IU] via SUBCUTANEOUS
  Administered 2018-10-14 (×2): 1 [IU] via SUBCUTANEOUS
  Filled 2018-10-09 (×10): qty 1

## 2018-10-09 MED ORDER — SODIUM CHLORIDE 0.9 % IV SOLN
2.0000 g | Freq: Two times a day (BID) | INTRAVENOUS | Status: DC
  Administered 2018-10-09 – 2018-10-14 (×10): 2 g via INTRAVENOUS
  Filled 2018-10-09 (×12): qty 2

## 2018-10-09 MED ORDER — HEPARIN SODIUM (PORCINE) 5000 UNIT/ML IJ SOLN
5000.0000 [IU] | Freq: Three times a day (TID) | INTRAMUSCULAR | Status: DC
  Administered 2018-10-09 – 2018-10-11 (×6): 5000 [IU] via SUBCUTANEOUS
  Filled 2018-10-09 (×6): qty 1

## 2018-10-09 MED ORDER — SODIUM CHLORIDE 0.9 % IV BOLUS
1000.0000 mL | Freq: Once | INTRAVENOUS | Status: AC
  Administered 2018-10-09: 1000 mL via INTRAVENOUS

## 2018-10-09 MED ORDER — PNEUMOCOCCAL VAC POLYVALENT 25 MCG/0.5ML IJ INJ: 0.5000 mL | INJECTION | INTRAMUSCULAR | Status: DC

## 2018-10-09 MED ORDER — ASPIRIN EC 81 MG PO TBEC
81.0000 mg | DELAYED_RELEASE_TABLET | Freq: Every day | ORAL | Status: DC
  Administered 2018-10-10 – 2018-10-14 (×5): 81 mg via ORAL
  Filled 2018-10-09 (×5): qty 1

## 2018-10-09 MED ORDER — VANCOMYCIN HCL IN DEXTROSE 1-5 GM/200ML-% IV SOLN
1000.0000 mg | Freq: Once | INTRAVENOUS | Status: AC
  Administered 2018-10-09: 1000 mg via INTRAVENOUS
  Filled 2018-10-09: qty 200

## 2018-10-09 MED ORDER — SIMVASTATIN 5 MG PO TABS
5.0000 mg | ORAL_TABLET | Freq: Every day | ORAL | Status: DC
  Administered 2018-10-10 – 2018-10-13 (×4): 5 mg via ORAL
  Filled 2018-10-09 (×5): qty 1

## 2018-10-09 MED ORDER — METRONIDAZOLE IN NACL 5-0.79 MG/ML-% IV SOLN
500.0000 mg | Freq: Three times a day (TID) | INTRAVENOUS | Status: DC
  Administered 2018-10-09 – 2018-10-14 (×15): 500 mg via INTRAVENOUS
  Filled 2018-10-09 (×17): qty 100

## 2018-10-09 MED ORDER — METOPROLOL TARTRATE 25 MG PO TABS
ORAL_TABLET | ORAL | Status: AC
  Filled 2018-10-09: qty 1

## 2018-10-09 MED ORDER — METOPROLOL TARTRATE 25 MG PO TABS
12.5000 mg | ORAL_TABLET | Freq: Two times a day (BID) | ORAL | Status: DC
  Administered 2018-10-09: 12.5 mg via ORAL

## 2018-10-09 MED ORDER — ACETAMINOPHEN 325 MG PO TABS
650.0000 mg | ORAL_TABLET | Freq: Four times a day (QID) | ORAL | Status: DC | PRN
  Administered 2018-10-09 – 2018-10-10 (×4): 650 mg via ORAL
  Filled 2018-10-09 (×4): qty 2

## 2018-10-09 MED ORDER — METOPROLOL TARTRATE 25 MG PO TABS
12.5000 mg | ORAL_TABLET | Freq: Once | ORAL | Status: AC
  Administered 2018-10-09: 12.5 mg via ORAL
  Filled 2018-10-09: qty 1

## 2018-10-09 MED ORDER — VANCOMYCIN HCL IN DEXTROSE 1-5 GM/200ML-% IV SOLN
1000.0000 mg | INTRAVENOUS | Status: DC
  Administered 2018-10-09 – 2018-10-10 (×2): 1000 mg via INTRAVENOUS
  Filled 2018-10-09 (×4): qty 200

## 2018-10-09 MED ORDER — SENNOSIDES-DOCUSATE SODIUM 8.6-50 MG PO TABS: 1.0000 | ORAL_TABLET | Freq: Every evening | ORAL | Status: DC | PRN

## 2018-10-09 MED ORDER — BISACODYL 5 MG PO TBEC: 5.0000 mg | DELAYED_RELEASE_TABLET | Freq: Every day | ORAL | Status: DC | PRN

## 2018-10-09 MED ORDER — ONDANSETRON HCL 4 MG/2ML IJ SOLN
4.0000 mg | Freq: Four times a day (QID) | INTRAMUSCULAR | Status: DC | PRN
  Administered 2018-10-10 (×2): 4 mg via INTRAVENOUS
  Filled 2018-10-09 (×2): qty 2

## 2018-10-09 MED ORDER — SODIUM CHLORIDE 0.9 % IV SOLN
INTRAVENOUS | Status: DC
  Administered 2018-10-09 – 2018-10-13 (×7): via INTRAVENOUS

## 2018-10-09 MED ORDER — SODIUM CHLORIDE 0.9 % IV SOLN
2.0000 g | Freq: Once | INTRAVENOUS | Status: AC
  Administered 2018-10-09: 2 g via INTRAVENOUS
  Filled 2018-10-09: qty 2

## 2018-10-09 MED ORDER — SODIUM CHLORIDE 0.9 % IV SOLN
1000.0000 mL | Freq: Once | INTRAVENOUS | Status: AC
  Administered 2018-10-09: 1000 mL via INTRAVENOUS

## 2018-10-09 MED ORDER — ALBUTEROL SULFATE (2.5 MG/3ML) 0.083% IN NEBU: 2.5000 mg | INHALATION_SOLUTION | RESPIRATORY_TRACT | Status: DC | PRN

## 2018-10-09 MED ORDER — INSULIN ASPART 100 UNIT/ML ~~LOC~~ SOLN
0.0000 [IU] | Freq: Every day | SUBCUTANEOUS | Status: DC
  Administered 2018-10-10: 2 [IU] via SUBCUTANEOUS
  Filled 2018-10-09: qty 1

## 2018-10-09 MED ORDER — ONDANSETRON HCL 4 MG PO TABS: 4.0000 mg | ORAL_TABLET | Freq: Four times a day (QID) | ORAL | Status: DC | PRN

## 2018-10-09 MED ORDER — INFLUENZA VAC SPLIT HIGH-DOSE 0.5 ML IM SUSY
0.5000 mL | PREFILLED_SYRINGE | INTRAMUSCULAR | Status: DC
  Filled 2018-10-09: qty 0.5

## 2018-10-09 MED ORDER — ACETAMINOPHEN 650 MG RE SUPP: 650.0000 mg | Freq: Four times a day (QID) | RECTAL | Status: DC | PRN

## 2018-10-09 NOTE — H&P (Addendum)
[facility] [place] NAME: Richard Gordon    MR#:  [id number]  DATE OF BIRTH:  1951/09/10  DATE OF ADMISSION:  10/09/2018  PRIMARY CARE PHYSICIAN: Leone Haven, MD   REQUESTING/REFERRING PHYSICIAN: Dr. Corky Downs.  CHIEF COMPLAINT:   Chief Complaint  Patient presents with  . Generalized Body Aches   Generalized body aches. HISTORY OF PRESENT ILLNESS:  Richard Gordon  is a 67 y.o. male with a known history of hypertension, diabetes, hyperlipidemia, CKD stage III and CVA.  The patient presented to ED with above chief complaints.  The patient had a tooth extraction 1 week ago.  He has had body aching and intermittent nausea since that time.  He is black sugar is also elevated.  He is found tachycardia, tachypnea and leukocytosis in the ED.  Sepsis protocol is a started.  He is being treated with antibiotics in the ED.  Chest x-ray is unremarkable.  Urinalysis is normal.  PAST MEDICAL HISTORY:   Past Medical History:  Diagnosis Date  . Chronic kidney disease (CKD), stage III (moderate) ([place]) 11/03/2015  . Diabetes mellitus without complication ([place])   . Hyperlipidemia   . Hypertension   . Stroke [facility])    per pt 2 CVA in 2000,and 1998 (  R ,then L side effected seperately per pt )    PAST SURGICAL HISTORY:   Past Surgical History:  Procedure Laterality Date  . CATARACT EXTRACTION    . TONSILLECTOMY      SOCIAL HISTORY:   Social History   Tobacco Use  . Smoking status: Former Smoker    Packs/day: 0.50    Years: 25.00    Pack years: 12.50    Types: Cigarettes    Last attempt to quit: 04/04/1999    Years since quitting: 19.5  . Smokeless tobacco: Former Systems developer    Types: Snuff  Substance Use Topics  . Alcohol use: No    Alcohol/week: 0.0 standard drinks    FAMILY HISTORY:   Family History  Problem Relation Age of Onset  . Hypertension Father   . Diabetes Father   . Brain cancer Mother        per pt he was 2 and thinks  this was cancer   . Coronary artery disease Sister        scarlet fever as child affected heart per pt    DRUG ALLERGIES:  No Known Allergies  REVIEW OF SYSTEMS:   Review of Systems  Constitutional: Positive for malaise/fatigue. Negative for chills and fever.  HENT: Negative for sore throat.   Eyes: Negative for blurred vision and double vision.  Respiratory: Negative for cough, hemoptysis, shortness of breath, wheezing and stridor.   Cardiovascular: Negative for chest pain, palpitations, orthopnea and leg swelling.  Gastrointestinal: Negative for abdominal pain, blood in stool, diarrhea, melena, nausea and vomiting.  Genitourinary: Negative for dysuria, flank pain and hematuria.  Musculoskeletal: Negative for back pain and joint pain.       Generalized body aching.  Neurological: Negative for dizziness, sensory change, focal weakness, seizures, loss of consciousness, weakness and headaches.  Endo/Heme/Allergies: Negative for polydipsia.  Psychiatric/Behavioral: Negative for depression. The patient is not nervous/anxious.     MEDICATIONS AT HOME:   Prior to Admission medications   Medication Sig Start Date End Date Taking? Authorizing Provider  aspirin EC 81 MG tablet Take 1 tablet (81 mg total) by mouth daily. 07/04/17  Yes Cook, Jayce G, DO  blood  glucose meter kit and supplies KIT Accu chek. Check twice daily. E11.42. 09/13/17  Yes Leone Haven, MD  empagliflozin (JARDIANCE) 10 MG TABS tablet Take 10 mg by mouth daily. 07/30/18  Yes Leone Haven, MD  glucose blood [facility] VERIO) test strip Check blood sugar twice daily Dx: E11.42 07/20/18  Yes Leone Haven, MD  hydrochlorothiazide (HYDRODIURIL) 12.5 MG tablet TAKE 1 TABLET DAILY Patient taking differently: Take 12.5 mg by mouth daily.  07/20/18  Yes Leone Haven, MD  Insulin Glargine (BASAGLAR KWIKPEN) 100 UNIT/ML SOPN Inject 0.42 mLs (42 Units total) into the skin at bedtime. Patient taking differently:  Inject 41 Units into the skin at bedtime.  12/21/17  Yes Leone Haven, MD  Insulin Pen Needle 31G X 5 MM MISC 1 each by Does not apply route daily. 07/20/18  Yes Leone Haven, MD  Lancets (ACCU-CHEK SOFT TOUCH) lancets Check blood sugar twice daily. DX: E11.42.  Quantity Sufficient 90 day supply 03/27/17  Yes Cook, Jayce G, DO  lisinopril (PRINIVIL,ZESTRIL) 20 MG tablet TAKE 1 TABLET DAILY Patient taking differently: Take 20 mg by mouth daily.  10/08/18  Yes Leone Haven, MD  metFORMIN (GLUCOPHAGE) 1000 MG tablet Take 1 tablet (1,000 mg total) by mouth 2 (two) times daily. Patient taking differently: Take 1,000 mg by mouth daily with breakfast.  02/27/18  Yes Leone Haven, MD  simvastatin (ZOCOR) 5 MG tablet Take 1 tablet (5 mg total) by mouth daily at 6 PM. 12/19/17  Yes Leone Haven, MD  cyclobenzaprine (FLEXERIL) 5 MG tablet Take 1 tablet (5 mg total) by mouth 3 (three) times daily as needed for muscle spasms. Patient not taking: Reported on 10/09/2018 08/24/18   Burnard Hawthorne, FNP  ondansetron (ZOFRAN ODT) 4 MG disintegrating tablet Take 1 tablet (4 mg total) by mouth every 8 (eight) hours as needed for nausea or vomiting. Patient not taking: Reported on 10/09/2018 08/24/18   Burnard Hawthorne, FNP      VITAL SIGNS:  Blood pressure 133/60, pulse (!) 129, temperature 98.5 F (36.9 C), temperature source Oral, resp. rate (!) 25, height 5' 10" (1.778 m), weight 79.4 kg, SpO2 98 %.  PHYSICAL EXAMINATION:  Physical Exam  GENERAL:  67 y.o.-year-old patient lying in the bed with no acute distress.  EYES: Pupils equal, round, reactive to light and accommodation. No scleral icterus. Extraocular muscles intact.  HEENT: Head atraumatic, normocephalic. Oropharynx and nasopharynx clear.  NECK:  Supple, no jugular venous distention. No thyroid enlargement, no tenderness.  LUNGS: Normal breath sounds bilaterally, no wheezing, rales,rhonchi or crepitation. No use of accessory  muscles of respiration.  CARDIOVASCULAR: S1, S2 normal. No murmurs, rubs, or gallops.  ABDOMEN: Soft, nontender, nondistended. Bowel sounds present. No organomegaly or mass.  EXTREMITIES: No pedal edema, cyanosis, or clubbing.  NEUROLOGIC: Cranial nerves II through XII are intact. Muscle strength 5/5 in all extremities. Sensation intact. Gait not checked.  PSYCHIATRIC: The patient is alert and oriented x 3.  SKIN: No obvious rash, lesion, or ulcer.   LABORATORY PANEL:   CBC Recent Labs  Lab 10/09/18 0946  WBC 27.9*  HGB 11.1*  HCT 34.3*  PLT 284   ------------------------------------------------------------------------------------------------------------------  Chemistries  Recent Labs  Lab 10/09/18 0946  NA 130*  K 4.5  CL 92*  CO2 20*  GLUCOSE 215*  BUN 38*  CREATININE 1.77*  CALCIUM 8.7*  AST 33  ALT 23  ALKPHOS 505*  BILITOT 1.6*   ------------------------------------------------------------------------------------------------------------------  Cardiac  Enzymes No results for input(s): TROPONINI in the last 168 hours. ------------------------------------------------------------------------------------------------------------------  RADIOLOGY:  Dg Chest 2 View  Result Date: 10/09/2018 CLINICAL DATA:  Generalized body aches. EXAM: CHEST - 2 VIEW COMPARISON:  Radiographs of March 14, 2011. FINDINGS: The heart size and mediastinal contours are within normal limits. Both lungs are clear. No pneumothorax or pleural effusion is noted. The visualized skeletal structures are unremarkable. IMPRESSION: No active cardiopulmonary disease. Electronically Signed   By: Marijo Conception, M.D.   On: 10/09/2018 10:37      IMPRESSION AND PLAN:   Sepsis, unclear etiology.  Possible related to tooth extraction, rule out bacteremia. The patient will be admitted to medical floor. Continue cefepime, vancomycin and Flagyl IV, follow-up CBC and cultures.  Acute renal failure on CKD  stage 3.  Hold metformin, diuretics and lisinopril, IV fluid support and follow-up BMP.  Hyponatremia due to dehydration, normal saline IV and follow-up BMP.  Hypertension.  Hold diuretics and lisinopril due to renal failure.  Diabetes.  Continue Lantus and start sliding scale.  Hold metformin.  All the records are reviewed and case discussed with ED provider. Management plans discussed with the patient, family and they are in agreement.  CODE STATUS: Full code.  TOTAL TIME TAKING CARE OF THIS PATIENT: 39 minutes.    Demetrios Loll M.D on 10/09/2018 at 2:38 PM  Between 7am to 6pm - Pager - [phone]  After 6pm go to [email or web address] - [employer] [facility]  Office  [phone]  CC: Primary care physician; Leone Haven, MD   Note: This dictation was prepared with Dragon dictation along with smaller phrase technology. Any transcriptional errors that result from this process are unin

## 2018-10-09 NOTE — ED Notes (Signed)
Butch RN, aware of bed assigned

## 2018-10-09 NOTE — ED Notes (Signed)
Pt given sandwich tray and water by ed tech.

## 2018-10-09 NOTE — Progress Notes (Signed)
Contacted RN Alvira Monday. Discussed vital signs HR 135, RR 28. Requested she address with MD as HR, RR would be concerning to staff on med surg area. Stated she would discuss with Agricultural consultant and MD.

## 2018-10-09 NOTE — ED Notes (Addendum)
Admitting md states pt can eat and drink at this time

## 2018-10-09 NOTE — ED Notes (Signed)
Richard Gordon [facility] states pt unable to go to 1c with HR of 130-140's. Secretary to page admitting physician

## 2018-10-09 NOTE — ED Notes (Signed)
This RN spoke to Dr. Bridgett Larsson, admitting physician. Dr. Bridgett Larsson states to administer 1096m bolus at this time. When bolus is complete, if patient is still tachy, pt needs to be admitted to tele. After bolus if HR has improved, pt to be admitted to 1C

## 2018-10-09 NOTE — ED Notes (Addendum)
Pt states that he fractured a tooth over a week ago, went to dentist and had it removed. On assessment where tooth extracted (lower left back molar)  there is no redness or other obvious signs of infection

## 2018-10-09 NOTE — ED Triage Notes (Signed)
Patient sent by [facility]. Reports generalized body aches x2 months. Unsure of fevers. Reports he was seen in ED recently for same and given fluids. Denies any cough, sore throat. Reports increased thirst. History of DM but states glucose has been controlled at home.

## 2018-10-09 NOTE — Consult Note (Signed)
Pharmacy Antibiotic Note  Richard Gordon is a 67 y.o. male admitted on 10/09/2018 with sepsis.  Pharmacy has been consulted for Vancomycin/Cefepime dosing.  Plan:  Vancomycin 1061m IV every 18 hours with 6 hour stacked dosing.   Expected trough is 16.86 mcg/ml drawn prior to 4th dose.    Goal trough 15-20 mcg/mL.  Ke = 0.041, t1/2= 16.9, Vd = 55.6  Cefepime 2g q 12hours based on creatinine clearance between 30-60 ml/min.   Height: 5' 10" (177.8 cm) Weight: 175 lb 0.7 oz (79.4 kg) IBW/kg (Calculated) : 73  Temp (24hrs), Avg:98.5 F (36.9 C), Min:98.5 F (36.9 C), Max:98.5 F (36.9 C)  Recent Labs  Lab 10/07/18 0900 10/09/18 0945 10/09/18 0946 10/09/18 1210  WBC 15.3*  --  27.9*  --   CREATININE 1.30*  --  1.77*  --   LATICACIDVEN  --  2.0*  --  0.9    Estimated Creatinine Clearance: 41.2 mL/min (A) (by C-G formula based on SCr of 1.77 mg/dL (H)).    No Known Allergies  Antimicrobials this admission: Vancomycin 11/05 >>   Cefepime 11/05 >>  Metronidazole 11/05 >>  Dose adjustments this admission: N/A  Microbiology results: 11/05 BCx: pending 11/05 UCx: pending     Thank you for allowing pharmacy to be a part of this patient's care.  CLu Duffel PharmD Clinical Pharmacist 10/09/2018 2:49 PM

## 2018-10-09 NOTE — ED Provider Notes (Signed)
[facility] Emergency Department Provider Note   ____________________________________________    I have reviewed the triage vital signs and the nursing notes.   HISTORY  Chief Complaint Generalized Body Aches     HPI Richard Gordon is a 67 y.o. male who presents with complaints of body aches, primarily in his legs.  Patient reports this is been intermittent over the last 2 months but seems to have gotten worse over the last week.  Seen in the emergency department several days ago for similar complaints.  Felt better after IV fluids.  Patient does have a history of chronic kidney disease, diabetes.  Denies recent exercise or injury.  Does describe intermittent fevers and chills.  Had a tooth pulled sometime ago which may have given him an infection, unclear if he was on antibiotics for this or not   Past Medical History:  Diagnosis Date  . Chronic kidney disease (CKD), stage III (moderate) ([place]) 11/03/2015  . Diabetes mellitus without complication ([place])   . Hyperlipidemia   . Hypertension   . Stroke [facility])    per pt 2 CVA in 2000,and 1998 (  R ,then L side effected seperately per pt )    Patient Active Problem List   Diagnosis Date Noted  . Sepsis ([place]) 10/09/2018  . Decreased energy 07/09/2018  . Thrombocytopenia ([place]) 04/01/2018  . Syncope 03/09/2018  . AD (atopic dermatitis) 11/03/2015  . Chronic kidney disease (CKD), stage III (moderate) ([place]) 11/03/2015  . Personal history of stroke with residual effects 11/03/2015  . Hyperlipidemia associated with type 2 diabetes mellitus ([place]) 07/09/2015  . Hypertension 07/09/2015  . DM type 2 with diabetic peripheral neuropathy ([place]) 05/22/2015    Past Surgical History:  Procedure Laterality Date  . CATARACT EXTRACTION    . TONSILLECTOMY      Prior to Admission medications   Medication Sig Start Date End Date Taking? Authorizing Provider  aspirin EC 81 MG tablet Take 1 tablet (81 mg total) by  mouth daily. 07/04/17  Yes Cook, Jayce G, DO  blood glucose meter kit and supplies KIT Accu chek. Check twice daily. E11.42. 09/13/17  Yes Leone Haven, MD  empagliflozin (JARDIANCE) 10 MG TABS tablet Take 10 mg by mouth daily. 07/30/18  Yes Leone Haven, MD  glucose blood [facility] VERIO) test strip Check blood sugar twice daily Dx: E11.42 07/20/18  Yes Leone Haven, MD  hydrochlorothiazide (HYDRODIURIL) 12.5 MG tablet TAKE 1 TABLET DAILY Patient taking differently: Take 12.5 mg by mouth daily.  07/20/18  Yes Leone Haven, MD  Insulin Glargine (BASAGLAR KWIKPEN) 100 UNIT/ML SOPN Inject 0.42 mLs (42 Units total) into the skin at bedtime. Patient taking differently: Inject 41 Units into the skin at bedtime.  12/21/17  Yes Leone Haven, MD  Insulin Pen Needle 31G X 5 MM MISC 1 each by Does not apply route daily. 07/20/18  Yes Leone Haven, MD  Lancets (ACCU-CHEK SOFT TOUCH) lancets Check blood sugar twice daily. DX: E11.42.  Quantity Sufficient 90 day supply 03/27/17  Yes Cook, Jayce G, DO  lisinopril (PRINIVIL,ZESTRIL) 20 MG tablet TAKE 1 TABLET DAILY Patient taking differently: Take 20 mg by mouth daily.  10/08/18  Yes Leone Haven, MD  metFORMIN (GLUCOPHAGE) 1000 MG tablet Take 1 tablet (1,000 mg total) by mouth 2 (two) times daily. Patient taking differently: Take 1,000 mg by mouth daily with breakfast.  02/27/18  Yes Leone Haven, MD  simvastatin (ZOCOR) 5 MG  tablet Take 1 tablet (5 mg total) by mouth daily at 6 PM. 12/19/17  Yes Leone Haven, MD  cyclobenzaprine (FLEXERIL) 5 MG tablet Take 1 tablet (5 mg total) by mouth 3 (three) times daily as needed for muscle spasms. Patient not taking: Reported on 10/09/2018 08/24/18   Burnard Hawthorne, FNP  ondansetron (ZOFRAN ODT) 4 MG disintegrating tablet Take 1 tablet (4 mg total) by mouth every 8 (eight) hours as needed for nausea or vomiting. Patient not taking: Reported on 10/09/2018 08/24/18   Burnard Hawthorne, FNP     Allergies Patient has no known allergies.  Family History  Problem Relation Age of Onset  . Hypertension Father   . Diabetes Father   . Brain cancer Mother        per pt he was 5 and thinks this was cancer   . Coronary artery disease Sister        scarlet fever as child affected heart per pt    Social History Social History   Tobacco Use  . Smoking status: Former Smoker    Packs/day: 0.50    Years: 25.00    Pack years: 12.50    Types: Cigarettes    Last attempt to quit: 04/04/1999    Years since quitting: 19.5  . Smokeless tobacco: Former Systems developer    Types: Snuff  Substance Use Topics  . Alcohol use: No    Alcohol/week: 0.0 standard drinks  . Drug use: No    Review of Systems  Constitutional: No fever/chills Eyes: No visual changes.  ENT: No sore throat. Cardiovascular: Denies chest pain. Respiratory: Denies shortness of breath. Gastrointestinal: No abdominal pain.  .   Genitourinary: Negative for dysuria. Musculoskeletal: Myalgias as above Skin: Negative for rash. Neurological: Negative for headaches    ____________________________________________   PHYSICAL EXAM:  VITAL SIGNS: ED Triage Vitals  Enc Vitals Group     BP 10/09/18 0944 (!) 113/56     Pulse Rate 10/09/18 0944 (!) 135     Resp 10/09/18 0944 (!) 24     Temp 10/09/18 0944 98.5 F (36.9 C)     Temp Source 10/09/18 0944 Oral     SpO2 10/09/18 0944 99 %     Weight 10/09/18 0945 79.4 kg (175 lb 0.7 oz)     Height 10/09/18 0945 1.778 m (5' 10")     Head Circumference --      Peak Flow --      Pain Score 10/09/18 0945 6     Pain Loc --      Pain Edu? --      Excl. in [place]? --     Constitutional: Alert and oriented.    Nose: No congestion/rhinnorhea. Mouth/Throat: Mucous membranes are moist.    Cardiovascular: Normal rate, regular rhythm. Grossly normal heart sounds.  Good peripheral circulation. Respiratory: Normal respiratory effort.  No retractions. Lungs  CTAB. Gastrointestinal: Soft and nontender. No distention.  No CVA tenderness. Genitourinary: deferred Musculoskeletal: No lower extremity tenderness nor edema.  Compartments soft.  No rash.  Warm and well perfused Neurologic:  Normal speech and language. No gross focal neurologic deficits are appreciated.  Skin:  Skin is warm, dry and intact.  Psychiatric: Mood and affect are normal. Speech and behavior are normal.  ____________________________________________   LABS (all labs ordered are listed, but only abnormal results are displayed)  Labs Reviewed  GLUCOSE, CAPILLARY - Abnormal; Notable for the following components:      Result Value  Glucose-Capillary 184 (*)    All other components within normal limits  LACTIC ACID, PLASMA - Abnormal; Notable for the following components:   Lactic Acid, Venous 2.0 (*)    All other components within normal limits  COMPREHENSIVE METABOLIC PANEL - Abnormal; Notable for the following components:   Sodium 130 (*)    Chloride 92 (*)    CO2 20 (*)    Glucose, Bld 215 (*)    BUN 38 (*)    Creatinine, Ser 1.77 (*)    Calcium 8.7 (*)    Albumin 3.3 (*)    Alkaline Phosphatase 505 (*)    Total Bilirubin 1.6 (*)    GFR calc non Af Amer 38 (*)    GFR calc Af Amer 44 (*)    Anion gap 18 (*)    All other components within normal limits  CBC WITH DIFFERENTIAL/PLATELET - Abnormal; Notable for the following components:   WBC 27.9 (*)    RBC 3.69 (*)    Hemoglobin 11.1 (*)    HCT 34.3 (*)    Neutro Abs 18.9 (*)    Monocytes Absolute 5.8 (*)    Abs Immature Granulocytes 1.33 (*)    All other components within normal limits  URINALYSIS, COMPLETE (UACMP) WITH MICROSCOPIC - Abnormal; Notable for the following components:   Color, Urine YELLOW (*)    APPearance CLEAR (*)    Glucose, UA >=500 (*)    Ketones, ur 20 (*)    Protein, ur 30 (*)    All other components within normal limits  CK - Abnormal; Notable for the following components:   Total CK  41 (*)    All other components within normal limits  CULTURE, BLOOD (ROUTINE X 2)  CULTURE, BLOOD (ROUTINE X 2)  URINE CULTURE  LACTIC ACID, PLASMA   ____________________________________________  EKG  ED ECG REPORT I, Lavonia Drafts, the attending physician, personally viewed and interpreted this ECG.   Rhythm: Sinus tachycardia QRS Axis: normal Intervals: normal ST/T Wave abnormalities: normal Narrative Interpretation: no evidence of acute ischemia  ____________________________________________  RADIOLOGY  X-ray unremarkable ____________________________________________   PROCEDURES  Procedure(s) performed: No  Procedures   Critical Care performed: yes  CRITICAL CARE Performed by: Lavonia Drafts   Total critical care time: 30 minutes  Critical care time was exclusive of separately billable procedures and treating other patients.  Critical care was necessary to treat or prevent imminent or life-threatening deterioration.  Critical care was time spent personally by me on the following activities: development of treatment plan with patient and/or surrogate as well as nursing, discussions with consultants, evaluation of patient's response to treatment, examination of patient, obtaining history from patient or surrogate, ordering and performing treatments and interventions, ordering and review of laboratory studies, ordering and review of radiographic studies, pulse oximetry and re-evaluation of patient's condition.  ____________________________________________   INITIAL IMPRESSION / ASSESSMENT AND PLAN / ED COURSE  Pertinent labs & imaging results that were available during my care of the patient were reviewed by me and considered in my medical decision making (see chart for details).  Patient presents with vague myalgias in his lower extremity's which seem to be ongoing over the last at least 2 months, possibly worse in the last month.  Patient is poor historian.   Will repeat labs, including CK although these were unremarkable several days ago but did have a mildly elevated white blood cell count.  Patient's white blood cell count is 27,000, he does have an elevated  lactic.  He is tachycardic.  CK is minimally elevated, not clinically significant.  Unclear cause of possible infection, no confusion or headache or neck pain.  No shortness of breath or cough, chest x-ray is normal.  Urinalysis is unremarkable.  Will treat with broad-spectrum IV antibiotics, IV cefepime, IV vancomycin, IV Flagyl and admit to the hospitalist awaiting cultures    ____________________________________________   FINAL CLINICAL IMPRESSION(S) / ED DIAGNOSES  Final diagnoses:  Sepsis, due to unspecified organism, unspecified whether acute organ dysfunction present [facility])  Myalgia        Note:  This document was prepared using Dragon voice recognition software and may include unintentional dictation errors.    Lavonia Drafts, MD 10/09/18 [phone]

## 2018-10-09 NOTE — Progress Notes (Signed)
Advanced Care Plan.  Purpose of Encounter: CODE STATUS. Parties in Attendance: The patient and me. Patient's Decisional Capacity: Yes. Medical Story: Richard Gordon  is a 67 y.o. male with a known history of hypertension, diabetes, hyperlipidemia, CKD stage III and CVA.  Patient is being admitted for sepsis for unknown source.  I discussed with the patient about his current condition, prognosis and CODE STATUS.  The patient hesitated to decide.  He said he never thought about it.  After I asked him again, he decided that he want to be resuscitated and intubated if he has cardiopulmonary arrest but he does not want to be hooked on ventilation for long time. Plan:  Code Status: Full code. Time spent discussing advance care planning: 17 minutes.

## 2018-10-09 NOTE — ED Notes (Signed)
Attempted to call report. Unable to give report at this time

## 2018-10-10 ENCOUNTER — Inpatient Hospital Stay: Payer: Medicare HMO

## 2018-10-10 LAB — BASIC METABOLIC PANEL
ANION GAP: 10 (ref 5–15)
BUN: 32 mg/dL — ABNORMAL HIGH (ref 8–23)
CO2: 24 mmol/L (ref 22–32)
Calcium: 8.4 mg/dL — ABNORMAL LOW (ref 8.9–10.3)
Chloride: 101 mmol/L (ref 98–111)
Creatinine, Ser: 1.35 mg/dL — ABNORMAL HIGH (ref 0.61–1.24)
GFR calc Af Amer: >60 mL/min (ref >60)
GFR calc non Af Amer: 52 mL/min — ABNORMAL LOW (ref >60)
GLUCOSE: 131 mg/dL — AB (ref 70–99)
POTASSIUM: 3.9 mmol/L (ref 3.5–5.1)
Sodium: 135 mmol/L (ref 135–145)

## 2018-10-10 LAB — GLUCOSE, CAPILLARY
GLUCOSE-CAPILLARY: 127 mg/dL — AB (ref 70–99)
GLUCOSE-CAPILLARY: 115 mg/dL — AB (ref 70–99)
Glucose-Capillary: 135 mg/dL — ABNORMAL HIGH (ref 70–99)
Glucose-Capillary: 125 mg/dL — ABNORMAL HIGH (ref 70–99)

## 2018-10-10 LAB — CBC
HCT: 32.6 % — ABNORMAL LOW (ref 39.0–52.0)
Hemoglobin: 10.4 g/dL — ABNORMAL LOW (ref 13.0–17.0)
MCH: 30.1 pg (ref 26.0–34.0)
MCHC: 31.9 g/dL (ref 30.0–36.0)
MCV: 94.5 fL (ref 80.0–100.0)
NRBC: 0 % (ref 0.0–0.2)
Platelets: 231 10*3/uL (ref 150–400)
RBC: 3.45 MIL/uL — AB (ref 4.22–5.81)
RDW: 14.6 % (ref 11.5–15.5)
WBC: 30.5 10*3/uL — ABNORMAL HIGH (ref 4.0–10.5)

## 2018-10-10 LAB — LACTIC ACID, PLASMA: Lactic Acid, Venous: 0.9 mmol/L (ref 0.5–1.9)

## 2018-10-10 LAB — URINE CULTURE: CULTURE: NO GROWTH

## 2018-10-10 LAB — HEMOGLOBIN A1C
HEMOGLOBIN A1C: 6.3 % — AB (ref 4.8–5.6)
MEAN PLASMA GLUCOSE: 134.11 mg/dL

## 2018-10-10 MED ORDER — IBUPROFEN 400 MG PO TABS
600.0000 mg | ORAL_TABLET | Freq: Once | ORAL | Status: AC
  Administered 2018-10-10: 600 mg via ORAL
  Filled 2018-10-10: qty 2

## 2018-10-10 MED ORDER — BISACODYL 10 MG RE SUPP
10.0000 mg | Freq: Once | RECTAL | Status: AC
  Administered 2018-10-10: 10 mg via RECTAL
  Filled 2018-10-10: qty 1

## 2018-10-10 MED ORDER — GLUCERNA SHAKE PO LIQD
237.0000 mL | Freq: Three times a day (TID) | ORAL | Status: DC
  Administered 2018-10-10 – 2018-10-13 (×6): 237 mL via ORAL

## 2018-10-10 MED ORDER — IOPAMIDOL (ISOVUE-300) INJECTION 61%
15.0000 mL | INTRAVENOUS | Status: AC
  Administered 2018-10-10: 15 mL via ORAL

## 2018-10-10 MED ORDER — METOPROLOL TARTRATE 50 MG PO TABS
50.0000 mg | ORAL_TABLET | Freq: Once | ORAL | Status: AC
  Administered 2018-10-10: 50 mg via ORAL
  Filled 2018-10-10: qty 1

## 2018-10-10 MED ORDER — ADULT MULTIVITAMIN W/MINERALS CH
1.0000 | ORAL_TABLET | Freq: Every day | ORAL | Status: DC
  Administered 2018-10-11 – 2018-10-14 (×4): 1 via ORAL
  Filled 2018-10-10 (×4): qty 1

## 2018-10-10 MED ORDER — SODIUM CHLORIDE 0.9 % IV BOLUS
1000.0000 mL | Freq: Once | INTRAVENOUS | Status: AC
  Administered 2018-10-10: 1000 mL via INTRAVENOUS

## 2018-10-10 MED ORDER — DILTIAZEM HCL 30 MG PO TABS
60.0000 mg | ORAL_TABLET | Freq: Three times a day (TID) | ORAL | Status: DC
  Administered 2018-10-10 (×2): 60 mg via ORAL
  Filled 2018-10-10 (×3): qty 2

## 2018-10-10 MED ORDER — INSULIN GLARGINE 100 UNIT/ML ~~LOC~~ SOLN
20.0000 [IU] | Freq: Every day | SUBCUTANEOUS | Status: DC
  Administered 2018-10-10 – 2018-10-13 (×5): 20 [IU] via SUBCUTANEOUS
  Filled 2018-10-10 (×6): qty 0.2

## 2018-10-10 MED ORDER — SODIUM CHLORIDE 0.9 % IV BOLUS
500.0000 mL | Freq: Once | INTRAVENOUS | Status: AC
  Administered 2018-10-10: 500 mL via INTRAVENOUS

## 2018-10-10 MED ORDER — PROMETHAZINE HCL 25 MG/ML IJ SOLN: 12.5000 mg | Freq: Four times a day (QID) | INTRAMUSCULAR | Status: DC | PRN

## 2018-10-10 MED ORDER — IOPAMIDOL (ISOVUE-300) INJECTION 61%
100.0000 mL | Freq: Once | INTRAVENOUS | Status: AC | PRN
  Administered 2018-10-10: 100 mL via INTRAVENOUS

## 2018-10-10 NOTE — Progress Notes (Signed)
Patients HR sustaining in 120s-130's MD made aware, orders give for PO cardizem, will give and continue to monitor.

## 2018-10-10 NOTE — Progress Notes (Signed)
Initial Nutrition Assessment  DOCUMENTATION CODES:   Not applicable  INTERVENTION:  Glucerna TID provides 220 kcals, 10 grams protein per serving MVI   NUTRITION DIAGNOSIS:   Inadequate oral intake related to acute illness(sepsis) as evidenced by meal completion < 50%, per patient/family report.   GOAL:   Patient will meet greater than or equal to 90% of their needs   MONITOR:   PO intake, Weight trends, Supplement acceptance, Labs  REASON FOR ASSESSMENT:   Malnutrition Screening Tool    ASSESSMENT:  67yrold pt admitted ED to hospital admit w/ sepsis of unknown source. PMH: HTN, T2DM, HLD, CKD3, CVA.   Pt awake and reports feeling just ok at time of visit. Pt stated he consumed approximately 4% of breakfast this morning, having a couple bites of eggs and one bite of his muffing. Pt reports just not feeling hungry and usually not a big eater in general. [facility] and stated his first meal of the day is 1 taco from [facility] a coffee which he has before his walk. Pt reports decreased energy and can no longer walk his usual 2.31m38ms.   Pt reports sometimes snacking on fruit and occasionally having chinese food. Pt stated he does not eat dinner, but will have chocolate Ensure if he feels hungry.   Pt recalls UBW of 174lb and his wt approximately 54mo38mo. Pt denies n/v/d and irregular BM; reports last BM as sometime last week.   Medications: novoLog 0-5units daily, 0-9units w/meals, Lantus 20units at bedtime, Flagyl, Vancomycin  Labs: Glucose 131 10/09/18 HgbA1c: 6.3 BUN 32 (H) Cr 1.35 (H) Ca w/out albumin for correction: 8.4 Temp: 99.3 (98.5-103 range from 11/4-11/6)  NUTRITION - FOCUSED PHYSICAL EXAM:    Most Recent Value  Orbital Region  Mild depletion  Upper Arm Region  No depletion  Thoracic and Lumbar Region  No depletion  Buccal Region  Mild depletion  Temple Region  Mild depletion  Clavicle Bone Region  No depletion   Clavicle and Acromion Bone Region  No depletion  Scapular Bone Region  No depletion  Dorsal Hand  No depletion  Patellar Region  No depletion  Anterior Thigh Region  No depletion  Posterior Calf Region  No depletion  Edema (RD Assessment)  None  Hair  Reviewed  Eyes  Reviewed  Mouth  Reviewed  Skin  Reviewed [pale]  Nails  Reviewed       Diet Order:   Diet Order            Diet heart healthy/carb modified Room service appropriate? Yes; Fluid consistency: Thin  Diet effective now              EDUCATION NEEDS:   No education needs have been identified at this time  Skin:  Skin Assessment: Reviewed RN Assessment(Scratch marks; left arm)  Last BM:  11/04  Height:   Ht Readings from Last 1 Encounters:  10/09/18 5' 10" (1.778 m)    Weight:   Wt Readings from Last 1 Encounters:  10/09/18 79.4 kg    Ideal Body Weight:  73 kg  BMI:  Body mass index is 25.12 kg/m.  Estimated Nutritional Needs:   Kcal:  2050-2200  Protein:  87-95g  Fluid:  2.0-2.2L    SuzaLajuan Lines, LDN  After Hours/Weekend Pager: [phone]

## 2018-10-10 NOTE — Progress Notes (Signed)
Talked to Dr. Duane Boston about patient being febrile, temp at 101.7 even after tylenol at 2139, order for Ibuprofen 64m to be given once, then recheck temp an hour after. Also order to check Lactic and cdiff, patient has not have any stool since admission, will give suppository per MD. HR is better 90's-110's. RN will continue to monitor.

## 2018-10-10 NOTE — Progress Notes (Signed)
Patients BP 86/47 patient drowsy but will awaken and answer questions appropriately, MD notified, orders given for 500 cc bolus, will given and continue to assess and monitor.

## 2018-10-10 NOTE — Progress Notes (Signed)
Talked to Dr. Vianne Bulls about patient's HR sustaining to 130's-140's, RN just gave patient his cardizem 60 mg p.o at 2021.  also patient has a low grade temp of 100.6, will give tylenol. IVF running at 100 ml/hr. Patient also complaints of nausea, PRN Zofran given. If HR still sustain tachy will call MD again. RN will continue to monitor.

## 2018-10-11 ENCOUNTER — Inpatient Hospital Stay: Payer: Medicare HMO

## 2018-10-11 ENCOUNTER — Encounter: Payer: Self-pay | Admitting: Radiology

## 2018-10-11 DIAGNOSIS — K0889 Other specified disorders of teeth and supporting structures: Secondary | ICD-10-CM

## 2018-10-11 DIAGNOSIS — Z98818 Other dental procedure status: Secondary | ICD-10-CM

## 2018-10-11 DIAGNOSIS — R748 Abnormal levels of other serum enzymes: Secondary | ICD-10-CM

## 2018-10-11 DIAGNOSIS — G8929 Other chronic pain: Secondary | ICD-10-CM

## 2018-10-11 DIAGNOSIS — R634 Abnormal weight loss: Secondary | ICD-10-CM

## 2018-10-11 DIAGNOSIS — E1122 Type 2 diabetes mellitus with diabetic chronic kidney disease: Secondary | ICD-10-CM

## 2018-10-11 DIAGNOSIS — N183 Chronic kidney disease, stage 3 (moderate): Secondary | ICD-10-CM

## 2018-10-11 DIAGNOSIS — E785 Hyperlipidemia, unspecified: Secondary | ICD-10-CM

## 2018-10-11 DIAGNOSIS — Z6825 Body mass index (BMI) 25.0-25.9, adult: Secondary | ICD-10-CM

## 2018-10-11 DIAGNOSIS — M545 Low back pain: Secondary | ICD-10-CM

## 2018-10-11 DIAGNOSIS — I129 Hypertensive chronic kidney disease with stage 1 through stage 4 chronic kidney disease, or unspecified chronic kidney disease: Secondary | ICD-10-CM

## 2018-10-11 DIAGNOSIS — D649 Anemia, unspecified: Secondary | ICD-10-CM

## 2018-10-11 DIAGNOSIS — R509 Fever, unspecified: Secondary | ICD-10-CM

## 2018-10-11 DIAGNOSIS — Z87891 Personal history of nicotine dependence: Secondary | ICD-10-CM

## 2018-10-11 DIAGNOSIS — A419 Sepsis, unspecified organism: Principal | ICD-10-CM

## 2018-10-11 LAB — GLUCOSE, CAPILLARY
GLUCOSE-CAPILLARY: 73 mg/dL (ref 70–99)
GLUCOSE-CAPILLARY: 149 mg/dL — AB (ref 70–99)
Glucose-Capillary: 109 mg/dL — ABNORMAL HIGH (ref 70–99)
Glucose-Capillary: 133 mg/dL — ABNORMAL HIGH (ref 70–99)

## 2018-10-11 LAB — CBC
HEMATOCRIT: 27.2 % — AB (ref 39.0–52.0)
Hemoglobin: 8.7 g/dL — ABNORMAL LOW (ref 13.0–17.0)
MCH: 30.5 pg (ref 26.0–34.0)
MCHC: 32 g/dL (ref 30.0–36.0)
MCV: 95.4 fL (ref 80.0–100.0)
NRBC: 0 % (ref 0.0–0.2)
PLATELETS: 169 10*3/uL (ref 150–400)
RBC: 2.85 MIL/uL — ABNORMAL LOW (ref 4.22–5.81)
RDW: 14.9 % (ref 11.5–15.5)
WBC: 31.3 10*3/uL — AB (ref 4.0–10.5)

## 2018-10-11 LAB — BASIC METABOLIC PANEL
ANION GAP: 10 (ref 5–15)
BUN: 31 mg/dL — ABNORMAL HIGH (ref 8–23)
CALCIUM: 7.5 mg/dL — AB (ref 8.9–10.3)
CO2: 20 mmol/L — AB (ref 22–32)
CREATININE: 1.33 mg/dL — AB (ref 0.61–1.24)
Chloride: 105 mmol/L (ref 98–111)
GFR calc Af Amer: >60 mL/min (ref >60)
GFR, EST NON AFRICAN AMERICAN: 53 mL/min — AB (ref >60)
GLUCOSE: 85 mg/dL (ref 70–99)
Potassium: 3.7 mmol/L (ref 3.5–5.1)
Sodium: 135 mmol/L (ref 135–145)

## 2018-10-11 LAB — HIV ANTIBODY (ROUTINE TESTING W REFLEX): HIV Screen 4th Generation wRfx: NONREACTIVE

## 2018-10-11 LAB — VANCOMYCIN, TROUGH: Vancomycin Tr: 10 ug/mL — ABNORMAL LOW (ref 15–20)

## 2018-10-11 MED ORDER — DILTIAZEM HCL 30 MG PO TABS
30.0000 mg | ORAL_TABLET | Freq: Three times a day (TID) | ORAL | Status: DC
  Administered 2018-10-11 – 2018-10-14 (×10): 30 mg via ORAL
  Filled 2018-10-11 (×10): qty 1

## 2018-10-11 MED ORDER — IOHEXOL 300 MG/ML  SOLN
75.0000 mL | Freq: Once | INTRAMUSCULAR | Status: AC | PRN
  Administered 2018-10-11: 75 mL via INTRAVENOUS

## 2018-10-11 MED ORDER — VANCOMYCIN HCL IN DEXTROSE 1-5 GM/200ML-% IV SOLN
1000.0000 mg | Freq: Two times a day (BID) | INTRAVENOUS | Status: DC
  Administered 2018-10-11 – 2018-10-14 (×7): 1000 mg via INTRAVENOUS
  Filled 2018-10-11 (×8): qty 200

## 2018-10-11 NOTE — Consult Note (Signed)
Pharmacy Antibiotic Note  Richard Gordon is a 67 y.o. male admitted on 10/09/2018 with sepsis.  Pharmacy has been consulted for Vancomycin/Cefepime dosing. Scr is stable and WBC > 30.   Plan:  Vancomycin 1012m IV every 18 hours with 6 hour stacked dosing. Obtained trough level ~17 hours post dose on 11/7 which was below goal (10 mcg/ml). Will increase frequency of vancomycin to 1000 mg q12h for a predictive trough of ~ 17 mcg/mL. Plan to obtain new trough level prior to the 4th dose.   Goal trough 15-20 mcg/mL.  Ke = 0.0611, t1/2= 11.36, Vd = 55.58  Cefepime 2g q 12hours based on creatinine clearance between 30-60 ml/min.   Height: 5' 10" (177.8 cm) Weight: 175 lb 0.7 oz (79.4 kg) IBW/kg (Calculated) : 73  Temp (24hrs), Avg:100.3 F (37.9 C), Min:98 F (36.7 C), Max:102.5 F (39.2 C)  Recent Labs  Lab 10/07/18 0900 10/09/18 0945 10/09/18 0946 10/09/18 1210 10/09/18 2308 10/10/18 0323 10/10/18 2309 10/11/18 0713  WBC 15.3*  --  27.9*  --   --  30.5*  --  31.3*  CREATININE 1.30*  --  1.77*  --  1.41* 1.35*  --  1.33*  LATICACIDVEN  --  2.0*  --  0.9  --   --  0.9  --   VANCOTROUGH  --   --   --   --   --   --   --  10*    Estimated Creatinine Clearance: 54.9 mL/min (A) (by C-G formula based on SCr of 1.33 mg/dL (H)).    No Known Allergies  Antimicrobials this admission: Vancomycin 11/05 >>   Cefepime 11/05 >>  Metronidazole 11/05 >>  Dose adjustments this admission: Increasing vanocmycin frequency from q18H to q12H.    Microbiology results: 11/05 BCx 2/2: NGTD 11/05 UCx: NGTD    Thank you for allowing pharmacy to be a part of this patient's care.  KOswald Hillock PharmD Clinical Pharmacist 10/11/2018 7:57 AM

## 2018-10-11 NOTE — Progress Notes (Addendum)
[facility] NAME: Richard Gordon    MR#:  [id number]  DATE OF BIRTH:  Apr 25, 1951  SUBJECTIVE:  CHIEF COMPLAINT:   Chief Complaint  Patient presents with  . Generalized Body Aches   Patient had tooth extraction 1 week ago and had intermittent nausea and generalized body aches since then. Noted to be septic but chest x-ray and urinalysis is normal so no clear source. He had persistent nausea in the hospital. No bowel movements. REVIEW OF SYSTEMS:  CONSTITUTIONAL: had fever,no fatigue or weakness.  EYES: No blurred or double vision.  EARS, NOSE, AND THROAT: No tinnitus or ear pain.  RESPIRATORY: No cough, shortness of breath, wheezing or hemoptysis.  CARDIOVASCULAR: No chest pain, orthopnea, edema.  GASTROINTESTINAL: have nausea, vomiting, no diarrhea or abdominal pain.  GENITOURINARY: No dysuria, hematuria.  ENDOCRINE: No polyuria, nocturia,  HEMATOLOGY: No anemia, easy bruising or bleeding SKIN: No rash or lesion. MUSCULOSKELETAL: No joint pain or arthritis.   NEUROLOGIC: No tingling, numbness, weakness.  PSYCHIATRY: No anxiety or depression.   ROS  DRUG ALLERGIES:  No Known Allergies  VITALS:  Blood pressure (!) 126/57, pulse (!) 102, temperature 99.6 F (37.6 C), temperature source Oral, resp. rate 18, height 5' 10" (1.778 m), weight 79.4 kg, SpO2 100 %.  PHYSICAL EXAMINATION:  GENERAL:  67 y.o.-year-old patient lying in the bed with no acute distress.  EYES: Pupils equal, round, reactive to light and accommodation. No scleral icterus. Extraocular muscles intact.  HEENT: Head atraumatic, normocephalic. Oropharynx and nasopharynx clear.  NECK:  Supple, no jugular venous distention. No thyroid enlargement, no tenderness.  LUNGS: Normal breath sounds bilaterally, no wheezing, rales,rhonchi or crepitation. No use of accessory muscles of respiration.  CARDIOVASCULAR: S1, S2 normal. No murmurs, rubs, or gallops.  ABDOMEN: Soft,  mild tender, nondistended. Bowel sounds present. No organomegaly or mass.  EXTREMITIES: No pedal edema, cyanosis, or clubbing.  NEUROLOGIC: Cranial nerves II through XII are intact. Muscle strength 5/5 in all extremities. Sensation intact. Gait not checked.  PSYCHIATRIC: The patient is alert and oriented x 3.  SKIN: No obvious rash, lesion, or ulcer.   Physical Exam LABORATORY PANEL:   CBC Recent Labs  Lab 10/11/18 0713  WBC 31.3*  HGB 8.7*  HCT 27.2*  PLT 169   ------------------------------------------------------------------------------------------------------------------  Chemistries  Recent Labs  Lab 10/09/18 0946  10/11/18 0713  NA 130*   < > 135  K 4.5   < > 3.7  CL 92*   < > 105  CO2 20*   < > 20*  GLUCOSE 215*   < > 85  BUN 38*   < > 31*  CREATININE 1.77*   < > 1.33*  CALCIUM 8.7*   < > 7.5*  AST 33  --   --   ALT 23  --   --   ALKPHOS 505*  --   --   BILITOT 1.6*  --   --    < > = values in this interval not displayed.   ------------------------------------------------------------------------------------------------------------------  Cardiac Enzymes No results for input(s): TROPONINI in the last 168 hours. ------------------------------------------------------------------------------------------------------------------  RADIOLOGY:  Dg Chest 2 View  Result Date: 10/10/2018 CLINICAL DATA:  Fever. EXAM: CHEST - 2 VIEW COMPARISON:  10/09/2018. FINDINGS: Mediastinum and hilar structures normal. Heart size stable. Mild bibasilar atelectasis. Mild left base infiltrate. No pleural effusion or pneumothorax. IMPRESSION: Mild bibasilar atelectasis.  Mild left base infiltrate. Electronically Signed   By: [name]  Register  On: 10/10/2018 12:54   Ct Abdomen Pelvis W Contrast  Result Date: 10/10/2018 CLINICAL DATA:  Sepsis.  Fever of unknown origin. EXAM: CT ABDOMEN AND PELVIS WITH CONTRAST TECHNIQUE: Multidetector CT imaging of the abdomen and pelvis was performed  using the standard protocol following bolus administration of intravenous contrast. CONTRAST:  151m ISOVUE-300 IOPAMIDOL (ISOVUE-300) INJECTION 61% COMPARISON:  None. FINDINGS: Lower chest: Coronary artery calcification is evident. 9 mm short axis paraesophageal lymph node identified image 3/series 2. 4 mm right lower lobe pulmonary nodule identified (10/4). Hepatobiliary: No focal abnormality within the liver parenchyma. There is no evidence for gallstones, gallbladder wall thickening, or pericholecystic fluid. No intrahepatic or extrahepatic biliary dilation. Pancreas: Subtle peripancreatic edema evident with a suggestion of edema within the head of pancreas. No dilatation of the main pancreatic duct. Spleen: No splenomegaly. No focal mass lesion. Adrenals/Urinary Tract: No adrenal nodule or mass although there is surrounding stranding of both adrenal glands. Perinephric edema noted bilaterally without hydronephrosis or focal renal mass lesion. No evidence for hydroureter. The urinary bladder appears normal for the degree of distention. Stomach/Bowel: Stomach is nondistended. No gastric wall thickening. No evidence of outlet obstruction. Duodenum is normally positioned as is the ligament of Treitz. No small bowel wall thickening. No small bowel dilatation. The terminal ileum is normal. The appendix is normal. No gross colonic mass. No colonic wall thickening. No substantial diverticular change. Vascular/Lymphatic: There is abdominal aortic atherosclerosis without aneurysm. Portal vein and superior mesenteric vein are patent. Splenic vein is patent. There is no gastrohepatic or hepatoduodenal ligament lymphadenopathy. No intraperitoneal or retroperitoneal lymphadenopathy. Upper normal portal caval lymph node evident. Small para-aortic lymph nodes are associated. No pelvic sidewall lymphadenopathy. Reproductive: The prostate gland and seminal vesicles have normal imaging features. Other: No intraperitoneal free  fluid. Musculoskeletal: Small bilateral groin hernias contain only fat. Tiny gas bubbles in the subcutaneous fat of the right paramidline anterior abdominal wall likely related to an injection site. No worrisome lytic or sclerotic osseous abnormality. Heterogeneous bony mineralization likely related to the patient's known chronic kidney disease. IMPRESSION: 1. Soft tissue stranding is identified in the retroperitoneal space of the abdomen, diffusely around the pancreas, adrenal glands, and kidneys. These changes are fairly evenly distributed without a definite epicenter making the appearance indeterminate. Changes around the pancreas could be related to pancreatitis and the perirenal edema could reflect pyelonephritis. Bilateral symmetric periadrenal edema/stranding is of uncertain significance. 2. No evidence for discrete, focal abscess in the abdomen/pelvis. 3. 4 mm right lower lobe pulmonary nodule. No follow-up needed if patient is low-risk. Non-contrast chest CT can be considered in 12 months if patient is high-risk. This recommendation follows the consensus statement: Guidelines for Management of Incidental Pulmonary Nodules Detected on CT Images: From the Fleischner Society 2017; Radiology 2017; 284:228-243. 4.  Aortic Atherosclerois (ICD10-170.0) Electronically Signed   By: EMisty StanleyM.D.   On: 10/10/2018 17:00    ASSESSMENT AND PLAN:   Active Problems:   Sepsis ([place]   * Sepsis, unclear etiology.  Possible related to tooth extraction, rule out bacteremia. cx negative so far. Continue cefepime, vancomycin and Flagyl IV, follow-up CBC and cultures.  CT abd with contrast showed diffuse stranding, ut no source of infection, WBCs rising- called ID consult and will get CT maxillo facial.  * Acute renal failure on CKD stage 3.  Hold metformin, diuretics and lisinopril, IV fluid support and follow-up BMP.  Slow improvement.  * Hyponatremia due to dehydration, normal saline IV and follow-up  BMP.  *  Hypertension.  Hold diuretics and lisinopril due to renal failure.  * Diabetes.  Continue Lantus and start sliding scale.  Hold metformin.    All the records are reviewed and case discussed with Care Management/Social Workerr. Management plans discussed with the patient, family and they are in agreement.  CODE STATUS: Full.  TOTAL TIME TAKING CARE OF THIS PATIENT: 35 minutes.     POSSIBLE D/C IN 1-2 DAYS, DEPENDING ON CLINICAL CONDITION.   Vaughan Basta M.D on 10/11/2018   Between 7am to 6pm - Pager - [phone]  After 6pm go to [email or web address] - password [email or web address] [facility]  Office  [phone]  CC: Primary care physician; Leone Haven, MD  Note: This dictation was prepared with Dragon dictation along with smaller phrase technology. Any transcriptional errors that result from this process are unintentional.

## 2018-10-11 NOTE — Consult Note (Signed)
[name]  DOB: 1951-11-22  MRN: [id number]  Date/Time: 10/11/2018 5:10 PM Anselm Jungling Subjective:  REASON FOR CONSULT: Fever and sepsis ?Patient not a reliable historian Richard Gordon is a 67 y.o. male with a history of diabetes mellitus, hypertension, hyperlipidemia presents with not feeling well for the past few weeks. Initially he said it all started after he broke his tooth 4-5 weeks ago and then had a tooth extraction done a week ago and ever since has not felt well.  He has been on antibiotics after the tooth extraction he is taking amoxicillin since 10/03/2018.  But a few minutes later he said he has not been well right from the time the tooth broke.  He never had pain at the tooth.  His Gordon complaint this is not been able to walk very well because he feels weak in his legs.  He has poor appetite.  He says he has lost weight.  He has had some sweating.  He does not really complain of any fever.  In September he had gone to his PCP and was complaining of low back pain left leg pain which apparently is chronic but it had gotten worse at that time. Just before that he was seen in the ED on September 19 with chief complaints of generalized body aches feeling sore all over.  So not  really clear how long he  has been unwell.  He lives on his own.  He said before he walked well but of late has been walking using a cane and then a walker. He does not complain of any retention of urine, difficulty passing urine or dysuria.  Does not have any diarrhea or incontinence of stool.  She has some nausea and poor appetite.  He has no travel history.  He does not have any pets.  He does not smoke nor drink alcohol.  He came to the ED on 10/07/2018 and was discharged home after getting IV fluids in the hospital.  On 10/07/2018 his WBC count was 15.3.  Came back on 10/09/2018 complaining of body aches for 2 months.  Even though admission his temperature was 98.5 it did go up to 103 within a few hours.  He had a  WBC count of 27.  He had increased lactate.  Blood cultures were sent and he was admitted as sepsis and started on IV Vanco cefepime and Flagyl.  He had a chest x-ray on admission which did not show any cardiopulmonary disease.  The next day had a CT abdomen and pelvis as he has high white count . I am asked to see this.  No history of travel .  No activity like hunting or fishing or water sports.  No animal bites, tick bites or other insect bites Past Medical History:  Diagnosis Date  . Chronic kidney disease (CKD), stage III (moderate) ([place]) 11/03/2015  . Diabetes mellitus without complication ([place])   . Hyperlipidemia   . Hypertension   . Stroke [facility])    per pt 2 CVA in 2000,and 1998 (  R ,then L side effected seperately per pt )    Past Surgical History:  Procedure Laterality Date  . CATARACT EXTRACTION    . TONSILLECTOMY    Social history Former smoker, no alcohol Or illicit drug Family History  Problem Relation Age of Onset  . Hypertension Father   . Diabetes Father   . Brain cancer Mother        per pt he was  24 and thinks this was cancer   . Coronary artery disease Sister        scarlet fever as child affected heart per pt   No Known Allergies  Current Facility-Administered Medications  Medication Dose Route Frequency Provider Last Rate Last Dose  . 0.9 %  sodium chloride infusion   Intravenous Continuous Demetrios Loll, MD 100 mL/hr at 10/11/18 1222    . acetaminophen (TYLENOL) tablet 650 mg  650 mg Oral Q6H PRN Demetrios Loll, MD   650 mg at 10/10/18 2139   Or  . acetaminophen (TYLENOL) suppository 650 mg  650 mg Rectal Q6H PRN Demetrios Loll, MD      . albuterol (PROVENTIL) (2.5 MG/3ML) 0.083% nebulizer solution 2.5 mg  2.5 mg Nebulization Q2H PRN Demetrios Loll, MD      . aspirin EC tablet 81 mg  81 mg Oral Daily Demetrios Loll, MD   81 mg at 10/11/18 0942  . bisacodyl (DULCOLAX) EC tablet 5 mg  5 mg Oral Daily PRN Demetrios Loll, MD      . ceFEPIme (MAXIPIME) 2 g in sodium chloride 0.9 % 100  mL IVPB  2 g Intravenous Q12H Lu Duffel, RPH 200 mL/hr at 10/11/18 1117 2 g at 10/11/18 1117  . diltiazem (CARDIZEM) tablet 30 mg  30 mg Oral Q8H Arta Silence, MD   30 mg at 10/11/18 0942  . feeding supplement (GLUCERNA SHAKE) (GLUCERNA SHAKE) liquid 237 mL  237 mL Oral TID BM Vaughan Basta, MD   237 mL at 10/10/18 2021  . HYDROcodone-acetaminophen (NORCO/VICODIN) 5-325 MG per tablet 1-2 tablet  1-2 tablet Oral Q4H PRN Demetrios Loll, MD      . Influenza vac split quadrivalent PF (FLUZONE HIGH-DOSE) injection 0.5 mL  0.5 mL Intramuscular Tomorrow-1000 Demetrios Loll, MD      . insulin aspart (novoLOG) injection 0-5 Units  0-5 Units Subcutaneous QHS Demetrios Loll, MD   2 Units at 10/10/18 0019  . insulin aspart (novoLOG) injection 0-9 Units  0-9 Units Subcutaneous TID WC Demetrios Loll, MD   1 Units at 10/11/18 1214  . insulin glargine (LANTUS) injection 20 Units  20 Units Subcutaneous QHS Lance Coon, MD   20 Units at 10/10/18 2139  . metroNIDAZOLE (FLAGYL) IVPB 500 mg  500 mg Intravenous Richardean Canal, MD 100 mL/hr at 10/11/18 1220 500 mg at 10/11/18 1220  . multivitamin with minerals tablet 1 tablet  1 tablet Oral Daily Vaughan Basta, MD   1 tablet at 10/11/18 0942  . ondansetron (ZOFRAN) tablet 4 mg  4 mg Oral Q6H PRN Demetrios Loll, MD       Or  . ondansetron [facility]) injection 4 mg  4 mg Intravenous Q6H PRN Demetrios Loll, MD   4 mg at 10/10/18 2025  . pneumococcal 23 valent vaccine (PNU-IMMUNE) injection 0.5 mL  0.5 mL Intramuscular Tomorrow-1000 Demetrios Loll, MD      . promethazine (PHENERGAN) injection 12.5 mg  12.5 mg Intravenous Q6H PRN Vaughan Basta, MD      . senna-docusate (Senokot-S) tablet 1 tablet  1 tablet Oral QHS PRN Demetrios Loll, MD      . simvastatin (ZOCOR) tablet 5 mg  5 mg Oral q1800 Demetrios Loll, MD   5 mg at 10/10/18 1723  . vancomycin (VANCOCIN) IVPB 1000 mg/200 mL premix  1,000 mg Intravenous Q12H Oswald Hillock, RPH 200 mL/hr at 10/11/18 0945  1,000 mg at 10/11/18 0945     Abtx:  Anti-infectives (From admission, onward)  Start     Dose/Rate Route Frequency Ordered Stop   10/11/18 0900  vancomycin (VANCOCIN) IVPB 1000 mg/200 mL premix     1,000 mg 200 mL/hr over 60 Minutes Intravenous Every 12 hours 10/11/18 0807     10/09/18 2200  ceFEPIme (MAXIPIME) 2 g in sodium chloride 0.9 % 100 mL IVPB     2 g 200 mL/hr over 30 Minutes Intravenous Every 12 hours 10/09/18 1503     10/09/18 2000  vancomycin (VANCOCIN) IVPB 1000 mg/200 mL premix  Status:  Discontinued     1,000 mg 200 mL/hr over 60 Minutes Intravenous Every 18 hours 10/09/18 1503 10/11/18 0807   10/09/18 1145  ceFEPIme (MAXIPIME) 2 g in sodium chloride 0.9 % 100 mL IVPB     2 g 200 mL/hr over 30 Minutes Intravenous  Once 10/09/18 1136 10/09/18 1243   10/09/18 1145  metroNIDAZOLE (FLAGYL) IVPB 500 mg     500 mg 100 mL/hr over 60 Minutes Intravenous Every 8 hours 10/09/18 1136     10/09/18 1145  vancomycin (VANCOCIN) IVPB 1000 mg/200 mL premix     1,000 mg 200 mL/hr over 60 Minutes Intravenous  Once 10/09/18 1136 10/09/18 1524      REVIEW OF SYSTEMS:  Const: Subjective fever,  chills, positive weight loss Eyes: negative diplopia or visual changes, negative eye pain ENT: negative coryza, negative sore throat Resp: negative cough, hemoptysis, dyspnea Cards: negative for chest pain, palpitations, lower extremity edema GU: negative for frequency, dysuria and hematuria GI: Negative for abdominal pain, diarrhea, bleeding, constipation Skin: negative for rash and pruritus Heme: negative for easy bruising and gum/nose bleeding MS: Body aches, myalgias,, back pain and muscle weakness Concerned that he may fall  Neurolo:negative for headaches, has dizziness, vertigo, memory problems  Psych: negative for feelings of anxiety, depression  Endocrine: No polyuria or polydipsia Allergy/Immunology- negative for any medication or food allergies ?  Objective:  VITALS:  BP (!)  126/57 (BP Location: Left Arm)   Pulse (!) 102   Temp 99.6 F (37.6 C) (Oral)   Resp 18   Ht 5' 10" (1.778 m)   Wt 79.4 kg   SpO2 100%   BMI 25.12 kg/m  PHYSICAL EXAM:  General: Alert, cooperative, no distress, appears stated age.  Pale Head: Normocephalic, without obvious abnormality, atraumatic. Eyes: Conjunctivae clear, anicteric sclerae. Pupils are equal ENT Nares normal. No drainage or sinus tenderness. Lips, mucosa, and tongue normal. No Thrush, poor dentition but no tenderness Neck: Supple, symmetrical, no adenopathy, thyroid: non tender no carotid bruit and no JVD. Back: No CVA tenderness. Lungs: Bilateral air entry Heart: Regular rate and rhythm, no murmur, rub or gallop. Abdomen: Soft, non-tender,not distended. Bowel sounds normal. No masses Extremities: atraumatic, no cyanosis. No edema. No clubbing Skin: No rashes or lesions. Or bruising Lymph: Cervical, supraclavicular normal. Neurologic: Grossly non-focal, did not examine his gait Pertinent Labs Lab Results CBC    Component Value Date/Time   WBC 31.3 (H) 10/11/2018 0713   RBC 2.85 (L) 10/11/2018 0713   HGB 8.7 (L) 10/11/2018 0713   HGB 14.6 07/10/2013 2023   HCT 27.2 (L) 10/11/2018 0713   HCT 40.2 07/10/2013 2023   PLT 169 10/11/2018 0713   PLT 241 07/10/2013 2023   MCV 95.4 10/11/2018 0713   MCV 93 07/10/2013 2023   MCH 30.5 10/11/2018 0713   MCHC 32.0 10/11/2018 0713   RDW 14.9 10/11/2018 0713   RDW 12.9 07/10/2013 2023   LYMPHSABS 1.3 10/09/2018 0946  MONOABS 5.8 (H) 10/09/2018 0946   EOSABS 0.5 10/09/2018 0946   BASOSABS 0.1 10/09/2018 0946    CMP Latest Ref Rng & Units 10/11/2018 10/10/2018 10/09/2018  Glucose 70 - 99 mg/dL 85 131(H) -  BUN 8 - 23 mg/dL 31(H) 32(H) -  Creatinine 0.61 - 1.24 mg/dL 1.33(H) 1.35(H) 1.41(H)  Sodium 135 - 145 mmol/L 135 135 -  Potassium 3.5 - 5.1 mmol/L 3.7 3.9 -  Chloride 98 - 111 mmol/L 105 101 -  CO2 22 - 32 mmol/L 20(L) 24 -  Calcium 8.9 - 10.3 mg/dL 7.5(L)  8.4(L) -  Total Protein 6.5 - 8.1 g/dL - - -  Total Bilirubin 0.3 - 1.2 mg/dL - - -  Alkaline Phos 38 - 126 U/L - - -  AST 15 - 41 U/L - - -  ALT 0 - 44 U/L - - -      Microbiology: Recent Results (from the past 240 hour(s))  Blood culture (routine x 2)     Status: None (Preliminary result)   Collection Time: 10/09/18  9:45 AM  Result Value Ref Range Status   Specimen Description BLOOD LEFT ANTECUBITAL  Final   Special Requests Blood Culture adequate volume  Final   Culture   Final    NO GROWTH 2 DAYS Performed at [facility], [address], [place], [place] [address]    Report Status PENDING  Incomplete  Blood culture (routine x 2)     Status: None (Preliminary result)   Collection Time: 10/09/18 11:24 AM  Result Value Ref Range Status   Specimen Description BLOOD LEFT ANTECUBITAL  Final   Special Requests Blood Culture adequate volume  Final   Culture   Final    NO GROWTH 2 DAYS Performed at [facility], [address] [place], [place] [address]    Report Status PENDING  Incomplete  Urine culture     Status: None   Collection Time: 10/09/18 11:24 AM  Result Value Ref Range Status   Specimen Description   Final    URINE, RANDOM Performed at [facility], [address], [place], [place] [address]    Special Requests   Final    NONE Performed at [facility], [address] [place],  [address]    Culture   Final    NO GROWTH Performed at [facility], [place] [address] [place],  [address]    Report Status 10/10/2018 FINAL  Final   IMAGING RESULTS: ? Impression/Recommendation ?67 y.o. male with a history of diabetes mellitus, hypertension, hyperlipidemia presents with not feeling well for the past few weeks. Initially he said it all started after he broke his tooth 4-5 weeks ago and then had a tooth extraction done a week ago and ever since has not felt well.  He has been on antibiotics after the tooth  extraction he is taking amoxicillin since 10/03/2018.  But a few minutes later he said he has not been well right from the time the tooth broke.  He never had pain at the tooth.  His Gordon complaint this is not been able to walk very well because he feels weak in his legs.  He has poor appetite.  He says he has lost weight.  He has had some sweating.  He does not really complain of any fever.  In September he had gone to his PCP and was complaining of low back pain left leg pain which apparently is chronic but  it had gotten worse at that time. Just before that he was seen in the ED on September 19 with chief complaints of generalized body aches feeling sore all over.  So not  really clear how long he  has been unwell.  He lives on his own.  He said before he walked well but of late has been walking using a cane and then a walker.  Fever and leukocytosis.  Along with fatigue and weakness for few weeks, anemia.  History of tooth issue. Need to rule out endocarditis.  Is not very clear what is going.  The CT abdomen and pelvis shows diffuse soft tissue stranding in the retroperitoneal space of the abdomen around the pancreas adrenal glands and kidneys.  Unclear what this processes. Blood culture has been negative so far But because he has taken amoxicillin before he presented this could be negative from the antibiotic effect. He will need 2D echo He will need TEE depending on the echo results He may need tagged WBC imaging or PET scan. Because of complains of weakness in his legs and inability to walk very well he may need MRI of his spine to look for any infection there. He did have CT of the maxilla and that has shown multiple periapical abscesses but I am not sure whether that is going to cause this high white count.  There was no obvious collection which was noted in the maxillary scan..  Recommend dental opinion.  no pneumonia or UTI. Will change cefepime to ceftriaxone if blood culture does not grow any  pseudomonas  Anemia since November 2019. .?  Anemia secondary to infection versus other cause.  Increase in alkaline phophatase- r/o GB etiology Repeat LFTS, recommend US GB  ?CKD  Diabetes mellitus  Hypertension management as per primary team. ___________________________________________________ Discussed with patient, requesting provider Note:  This document was prepared using Dragon voice recognition software and may include unintentional dictation errors.

## 2018-10-11 NOTE — Progress Notes (Signed)
ID Will consult note to follow Pt with fatigue and fever since he broke a tooth and worse after tooth extraction Leucocytosis CT abdomen shows Soft tissue stranding is identified in the retroperitoneal space of the abdomen, diffusely around the pancreas, adrenal glands, and kidneys. These changes are fairly evenly distributed without a definite epicenter making the appearance indeterminate. Changes around the pancreas could be related to pancreatitis and the perirenal edema could reflect pyelonephritis. Bilateral symmetric periadrenal edema/stranding is of uncertain significance Not sure what this is? Will discuss with radiologist  Endocarditis is a concern but culture neg so far get 2 d echo/ may need TEE No diarrhea- cdiff would be a concern post antibiotics- currently on flagyl IV for FUO which will treat cdiff as well mayl empirically add vanco Po if wbc keeps going up May need tagged WBC scan/ CT chest with contrast

## 2018-10-11 NOTE — Progress Notes (Signed)
Patient has not had a bowel movement in the past 3 days. Can d/c orders for stool sample and precautions per Dr. Anselm Jungling.

## 2018-10-12 ENCOUNTER — Inpatient Hospital Stay (HOSPITAL_COMMUNITY)
Admit: 2018-10-12 | Discharge: 2018-10-12 | Disposition: A | Discharge status: Home / Self Care | Payer: Medicare HMO | Attending: Internal Medicine | Admitting: Internal Medicine

## 2018-10-12 DIAGNOSIS — I340 Nonrheumatic mitral (valve) insufficiency: Secondary | ICD-10-CM

## 2018-10-12 LAB — CBC
HEMATOCRIT: 27.4 % — AB (ref 39.0–52.0)
HEMOGLOBIN: 8.6 g/dL — AB (ref 13.0–17.0)
MCH: 29.8 pg (ref 26.0–34.0)
MCHC: 31.4 g/dL (ref 30.0–36.0)
MCV: 94.8 fL (ref 80.0–100.0)
NRBC: 0 % (ref 0.0–0.2)
Platelets: 190 10*3/uL (ref 150–400)
RBC: 2.89 MIL/uL — AB (ref 4.22–5.81)
RDW: 14.9 % (ref 11.5–15.5)
WBC: 24.3 10*3/uL — AB (ref 4.0–10.5)

## 2018-10-12 LAB — COMPREHENSIVE METABOLIC PANEL
ALBUMIN: 2 g/dL — AB (ref 3.5–5.0)
ALT: 61 U/L — ABNORMAL HIGH (ref 0–44)
AST: 72 U/L — ABNORMAL HIGH (ref 15–41)
Alkaline Phosphatase: 192 U/L — ABNORMAL HIGH (ref 38–126)
Anion gap: 6 (ref 5–15)
BILIRUBIN TOTAL: 1 mg/dL (ref 0.3–1.2)
BUN: 31 mg/dL — ABNORMAL HIGH (ref 8–23)
CO2: 21 mmol/L — ABNORMAL LOW (ref 22–32)
Calcium: 7.5 mg/dL — ABNORMAL LOW (ref 8.9–10.3)
Chloride: 109 mmol/L (ref 98–111)
Creatinine, Ser: 1.02 mg/dL (ref 0.61–1.24)
GFR calc Af Amer: >60 mL/min (ref >60)
GFR calc non Af Amer: >60 mL/min (ref >60)
GLUCOSE: 114 mg/dL — AB (ref 70–99)
Potassium: 3.5 mmol/L (ref 3.5–5.1)
Sodium: 136 mmol/L (ref 135–145)
TOTAL PROTEIN: 5.1 g/dL — AB (ref 6.5–8.1)

## 2018-10-12 LAB — ECHOCARDIOGRAM COMPLETE
Height: 70 in
Weight: 2800.72 oz

## 2018-10-12 LAB — GLUCOSE, CAPILLARY
Glucose-Capillary: 90 mg/dL (ref 70–99)
Glucose-Capillary: 146 mg/dL — ABNORMAL HIGH (ref 70–99)
Glucose-Capillary: 122 mg/dL — ABNORMAL HIGH (ref 70–99)

## 2018-10-12 LAB — VANCOMYCIN, TROUGH: Vancomycin Tr: 19 ug/mL (ref 15–20)

## 2018-10-12 MED ORDER — SODIUM CHLORIDE 0.9 % IV SOLN
INTRAVENOUS | Status: DC | PRN
  Administered 2018-10-12 – 2018-10-13 (×2): 500 mL via INTRAVENOUS

## 2018-10-12 MED ORDER — LISINOPRIL 20 MG PO TABS
20.0000 mg | ORAL_TABLET | Freq: Every day | ORAL | Status: DC
  Administered 2018-10-13 – 2018-10-14 (×2): 20 mg via ORAL
  Filled 2018-10-12 (×2): qty 1

## 2018-10-12 MED ORDER — PRO-STAT SUGAR FREE PO LIQD
30.0000 mL | Freq: Three times a day (TID) | ORAL | Status: DC
  Administered 2018-10-12: 30 mL via ORAL

## 2018-10-12 NOTE — Progress Notes (Signed)
*  PRELIMINARY RESULTS* Echocardiogram 2D Echocardiogram has been performed.  Richard Gordon Richard Gordon 10/12/2018, 9:32 AM

## 2018-10-12 NOTE — Progress Notes (Signed)
[facility] [place] [facility] [place] NAME: Richard Gordon    MR#:  [id number]  DATE OF BIRTH:  1951-09-22  SUBJECTIVE:  CHIEF COMPLAINT:   Chief Complaint  Patient presents with  . Generalized Body Aches   Patient had tooth extraction 1 week ago and had intermittent nausea and generalized body aches since then. Noted to be septic but chest x-ray and urinalysis is normal so no clear source. He had persistent nausea in the hospital. No bowel movements. REVIEW OF SYSTEMS:  CONSTITUTIONAL: had fever,no fatigue or weakness.  EYES: No blurred or double vision.  EARS, NOSE, AND THROAT: No tinnitus or ear pain.  RESPIRATORY: No cough, shortness of breath, wheezing or hemoptysis.  CARDIOVASCULAR: No chest pain, orthopnea, edema.  GASTROINTESTINAL: have nausea, vomiting, no diarrhea or abdominal pain.  GENITOURINARY: No dysuria, hematuria.  ENDOCRINE: No polyuria, nocturia,  HEMATOLOGY: No anemia, easy bruising or bleeding SKIN: No rash or lesion. MUSCULOSKELETAL: No joint pain or arthritis.   NEUROLOGIC: No tingling, numbness, weakness.  PSYCHIATRY: No anxiety or depression.   ROS  DRUG ALLERGIES:  No Known Allergies  VITALS:  Blood pressure (!) 142/64, pulse 82, temperature 98.7 F (37.1 C), temperature source Oral, resp. rate 18, height 5' 10" (1.778 m), weight 79.4 kg, SpO2 97 %.  PHYSICAL EXAMINATION:  GENERAL:  67 y.o.-year-old patient lying in the bed with no acute distress.  EYES: Pupils equal, round, reactive to light and accommodation. No scleral icterus. Extraocular muscles intact.  HEENT: Head atraumatic, normocephalic. Oropharynx and nasopharynx clear. Poor oral hyegiene. NECK:  Supple, no jugular venous distention. No thyroid enlargement, no tenderness.  LUNGS: Normal breath sounds bilaterally, no wheezing, rales,rhonchi or crepitation. No use of accessory muscles of respiration.  CARDIOVASCULAR: S1, S2 normal. No murmurs, rubs, or gallops.   ABDOMEN: Soft, mild tender, nondistended. Bowel sounds present. No organomegaly or mass.  EXTREMITIES: No pedal edema, cyanosis, or clubbing.  NEUROLOGIC: Cranial nerves II through XII are intact. Muscle strength 5/5 in all extremities. Sensation intact. Gait not checked.  PSYCHIATRIC: The patient is alert and oriented x 3.  SKIN: No obvious rash, lesion, or ulcer.   Physical Exam LABORATORY PANEL:   CBC Recent Labs  Lab 10/12/18 0516  WBC 24.3*  HGB 8.6*  HCT 27.4*  PLT 190   ------------------------------------------------------------------------------------------------------------------  Chemistries  Recent Labs  Lab 10/12/18 0516  NA 136  K 3.5  CL 109  CO2 21*  GLUCOSE 114*  BUN 31*  CREATININE 1.02  CALCIUM 7.5*  AST 72*  ALT 61*  ALKPHOS 192*  BILITOT 1.0   ------------------------------------------------------------------------------------------------------------------  Cardiac Enzymes No results for input(s): TROPONINI in the last 168 hours. ------------------------------------------------------------------------------------------------------------------  RADIOLOGY:  Ct Maxillofacial W Contrast  Result Date: 10/11/2018 CLINICAL DATA:  Tooth extraction 1 week ago. Septic. Assess for source. EXAM: CT MAXILLOFACIAL WITH CONTRAST TECHNIQUE: Multidetector CT imaging of the maxillofacial structures was performed with intravenous contrast. Multiplanar CT image reconstructions were also generated. CONTRAST:  24m OMNIPAQUE IOHEXOL 300 MG/ML  SOLN COMPARISON:  CT HEAD June 21, 2011 FINDINGS: OSSEOUS: No acute facial fracture. The mandible is intact, the condyles are located. No destructive bony lesions. Multiple mandible periapical abscess without soft tissue component. Moderate RIGHT C3-4 facet arthropathy. ORBITS: Ocular globes and orbital contents are non suspicious. Status post RIGHT ocular lens implant. SINUSES: Paranasal sinuses are well aerated. Intact nasal  septum is midline. Mastoid aircells are well aerated. SOFT TISSUES: No significant soft tissue swelling. No subcutaneous gas or  radiopaque foreign bodies. Moderate calcific atherosclerosis carotid bifurcations. LIMITED INTRACRANIAL: Nonacute. Severe calcific atherosclerosis carotid siphons. IMPRESSION: 1. No acute process in the face. 2. Multiple periapical abscess without fluid collection. Electronically Signed   By: Elon Alas M.D.   On: 10/11/2018 17:23    ASSESSMENT AND PLAN:   Active Problems:   Sepsis ([place])   * Sepsis, unclear etiology.  Possible related to tooth extraction, rule out bacteremia. cx negative so far. Pt received amoxicilline 1 week after tooth procedure. Continue cefepime, vancomycin and Flagyl IV, follow-up CBC and cultures.  CT abd with contrast showed diffuse stranding, ut no source of infection, WBCs rising- called ID consult  CT maxillofacial have multiple abscess.  * Acute renal failure on CKD stage 3.  Hold metformin, diuretics and lisinopril, IV fluid support and follow-up BMP.  Slow improvement.  * Hyponatremia due to dehydration, normal saline IV and follow-up BMP.  * Hypertension.  Hold diuretics and lisinopril due to renal failure.  * Diabetes.  Continue Lantus and start sliding scale.  Hold metformin.    All the records are reviewed and case discussed with Care Management/Social Workerr. Management plans discussed with the patient, family and they are in agreement.  CODE STATUS: Full.  TOTAL TIME TAKING CARE OF THIS PATIENT: 35 minutes.     POSSIBLE D/C IN 1-2 DAYS, DEPENDING ON CLINICAL CONDITION.   Vaughan Basta M.D on 10/12/2018   Between 7am to 6pm - Pager - [phone]  After 6pm go to [email or web address] - password [email or web address] [facility]  Office  [phone]  CC: Primary care physician; Leone Haven, MD  Note: This dictation was prepared with Dragon dictation along with smaller phrase  technology. Any transcriptional errors that result from this process are unintentional.

## 2018-10-12 NOTE — Progress Notes (Signed)
Pharmacy Antibiotic Note  Richard Gordon is a 67 y.o. male admitted on 10/09/2018 with sepsis.  Pharmacy has been consulted for Vancomycin/Cefepime dosing. Scr is stable and WBC > 30.   Plan:  Vancomycin 1061m IV every 18 hours with 6 hour stacked dosing. Obtained trough level ~17 hours post dose on 11/7 which was below goal (10 mcg/ml). Will increase frequency of vancomycin to 1000 mg q12h for a predictive trough of ~ 17 mcg/mL. Plan to obtain new trough level prior to the 4th dose.   Goal trough 15-20 mcg/mL.  Ke = 0.0611, t1/2= 11.36, Vd = 55.58  Cefepime 2g q 12hours based on creatinine clearance between 30-60 ml/min.  11/8:  Vanc trough @ 20:11 = 19 mcg/mL Will continue this pt on current dose of Vancomycin 1 gm IV Q12H.   Height: 5' 10" (177.8 cm) Weight: 175 lb 0.7 oz (79.4 kg) IBW/kg (Calculated) : 73  Temp (24hrs), Avg:98.4 F (36.9 C), Min:97.8 F (36.6 C), Max:98.8 F (37.1 C)  Recent Labs  Lab 10/07/18 0900 10/09/18 0945 10/09/18 0946 10/09/18 1210 10/09/18 2308 10/10/18 0323 10/10/18 2309 10/11/18 0713 10/12/18 0516 10/12/18 2011  WBC 15.3*  --  27.9*  --   --  30.5*  --  31.3* 24.3*  --   CREATININE 1.30*  --  1.77*  --  1.41* 1.35*  --  1.33* 1.02  --   LATICACIDVEN  --  2.0*  --  0.9  --   --  0.9  --   --   --   VANCOTROUGH  --   --   --   --   --   --   --  10*  --  19    Estimated Creatinine Clearance: 71.6 mL/min (by C-G formula based on SCr of 1.02 mg/dL).    No Known Allergies  Antimicrobials this admission:   >>    >>   Dose adjustments this admission:   Microbiology results:  BCx:   UCx:    Sputum:    MRSA PCR:   Thank you for allowing pharmacy to be a part of this patient's care.  Devorah Givhan D 10/12/2018 8:57 PM

## 2018-10-12 NOTE — Progress Notes (Signed)
[place] at [place] NAME: Richard Gordon    MR#:  [id number]  DATE OF BIRTH:  December 12, 1950  SUBJECTIVE:  CHIEF COMPLAINT:   Chief Complaint  Patient presents with  . Generalized Body Aches   Patient had tooth extraction 1 week ago and had intermittent nausea and generalized body aches since then. Noted to be septic but chest x-ray and urinalysis is normal so no clear source. He had persistent nausea in the hospital. No bowel movements. WBCs rising. REVIEW OF SYSTEMS:  CONSTITUTIONAL: had fever,no fatigue or weakness.  EYES: No blurred or double vision.  EARS, NOSE, AND THROAT: No tinnitus or ear pain.  RESPIRATORY: No cough, shortness of breath, wheezing or hemoptysis.  CARDIOVASCULAR: No chest pain, orthopnea, edema.  GASTROINTESTINAL: have nausea, vomiting, no diarrhea or abdominal pain.  GENITOURINARY: No dysuria, hematuria.  ENDOCRINE: No polyuria, nocturia,  HEMATOLOGY: No anemia, easy bruising or bleeding SKIN: No rash or lesion. MUSCULOSKELETAL: No joint pain or arthritis.   NEUROLOGIC: No tingling, numbness, weakness.  PSYCHIATRY: No anxiety or depression.   ROS  DRUG ALLERGIES:  No Known Allergies  VITALS:  Blood pressure (!) 143/56, pulse 92, temperature 97.8 F (36.6 C), temperature source Oral, resp. rate 18, height 5' 10" (1.778 m), weight 79.4 kg, SpO2 99 %.  PHYSICAL EXAMINATION:  GENERAL:  67 y.o.-year-old patient lying in the bed with no acute distress.  EYES: Pupils equal, round, reactive to light and accommodation. No scleral icterus. Extraocular muscles intact.  HEENT: Head atraumatic, normocephalic. Oropharynx and nasopharynx clear.  NECK:  Supple, no jugular venous distention. No thyroid enlargement, no tenderness.  LUNGS: Normal breath sounds bilaterally, no wheezing, rales,rhonchi or crepitation. No use of accessory muscles of respiration.  CARDIOVASCULAR: S1, S2 normal. No murmurs, rubs, or gallops.   ABDOMEN: Soft, mild tender, nondistended. Bowel sounds present. No organomegaly or mass.  EXTREMITIES: No pedal edema, cyanosis, or clubbing.  NEUROLOGIC: Cranial nerves II through XII are intact. Muscle strength 5/5 in all extremities. Sensation intact. Gait not checked.  PSYCHIATRIC: The patient is alert and oriented x 3.  SKIN: No obvious rash, lesion, or ulcer.   Physical Exam LABORATORY PANEL:   CBC Recent Labs  Lab 10/12/18 0516  WBC 24.3*  HGB 8.6*  HCT 27.4*  PLT 190   ------------------------------------------------------------------------------------------------------------------  Chemistries  Recent Labs  Lab 10/12/18 0516  NA 136  K 3.5  CL 109  CO2 21*  GLUCOSE 114*  BUN 31*  CREATININE 1.02  CALCIUM 7.5*  AST 72*  ALT 61*  ALKPHOS 192*  BILITOT 1.0   ------------------------------------------------------------------------------------------------------------------  Cardiac Enzymes No results for input(s): TROPONINI in the last 168 hours. ------------------------------------------------------------------------------------------------------------------  RADIOLOGY:  Dg Chest 2 View  Result Date: 10/10/2018 CLINICAL DATA:  Fever. EXAM: CHEST - 2 VIEW COMPARISON:  10/09/2018. FINDINGS: Mediastinum and hilar structures normal. Heart size stable. Mild bibasilar atelectasis. Mild left base infiltrate. No pleural effusion or pneumothorax. IMPRESSION: Mild bibasilar atelectasis.  Mild left base infiltrate. Electronically Signed   By: [place]   On: 10/10/2018 12:54   Ct Abdomen Pelvis W Contrast  Result Date: 10/10/2018 CLINICAL DATA:  Sepsis.  Fever of unknown origin. EXAM: CT ABDOMEN AND PELVIS WITH CONTRAST TECHNIQUE: Multidetector CT imaging of the abdomen and pelvis was performed using the standard protocol following bolus administration of intravenous contrast. CONTRAST:  135m ISOVUE-300 IOPAMIDOL (ISOVUE-300) INJECTION 61% COMPARISON:  None.  FINDINGS: Lower chest: Coronary artery calcification is evident. 9 mm short axis  paraesophageal lymph node identified image 3/series 2. 4 mm right lower lobe pulmonary nodule identified (10/4). Hepatobiliary: No focal abnormality within the liver parenchyma. There is no evidence for gallstones, gallbladder wall thickening, or pericholecystic fluid. No intrahepatic or extrahepatic biliary dilation. Pancreas: Subtle peripancreatic edema evident with a suggestion of edema within the head of pancreas. No dilatation of the main pancreatic duct. Spleen: No splenomegaly. No focal mass lesion. Adrenals/Urinary Tract: No adrenal nodule or mass although there is surrounding stranding of both adrenal glands. Perinephric edema noted bilaterally without hydronephrosis or focal renal mass lesion. No evidence for hydroureter. The urinary bladder appears normal for the degree of distention. Stomach/Bowel: Stomach is nondistended. No gastric wall thickening. No evidence of outlet obstruction. Duodenum is normally positioned as is the ligament of Treitz. No small bowel wall thickening. No small bowel dilatation. The terminal ileum is normal. The appendix is normal. No gross colonic mass. No colonic wall thickening. No substantial diverticular change. Vascular/Lymphatic: There is abdominal aortic atherosclerosis without aneurysm. Portal vein and superior mesenteric vein are patent. Splenic vein is patent. There is no gastrohepatic or hepatoduodenal ligament lymphadenopathy. No intraperitoneal or retroperitoneal lymphadenopathy. Upper normal portal caval lymph node evident. Small para-aortic lymph nodes are associated. No pelvic sidewall lymphadenopathy. Reproductive: The prostate gland and seminal vesicles have normal imaging features. Other: No intraperitoneal free fluid. Musculoskeletal: Small bilateral groin hernias contain only fat. Tiny gas bubbles in the subcutaneous fat of the right paramidline anterior abdominal wall likely  related to an injection site. No worrisome lytic or sclerotic osseous abnormality. Heterogeneous bony mineralization likely related to the patient's known chronic kidney disease. IMPRESSION: 1. Soft tissue stranding is identified in the retroperitoneal space of the abdomen, diffusely around the pancreas, adrenal glands, and kidneys. These changes are fairly evenly distributed without a definite epicenter making the appearance indeterminate. Changes around the pancreas could be related to pancreatitis and the perirenal edema could reflect pyelonephritis. Bilateral symmetric periadrenal edema/stranding is of uncertain significance. 2. No evidence for discrete, focal abscess in the abdomen/pelvis. 3. 4 mm right lower lobe pulmonary nodule. No follow-up needed if patient is low-risk. Non-contrast chest CT can be considered in 12 months if patient is high-risk. This recommendation follows the consensus statement: Guidelines for Management of Incidental Pulmonary Nodules Detected on CT Images: From the Fleischner Society 2017; Radiology 2017; 284:228-243. 4.  Aortic Atherosclerois (ICD10-170.0) Electronically Signed   By: Misty Stanley M.D.   On: 10/10/2018 17:00   Ct Maxillofacial W Contrast  Result Date: 10/11/2018 CLINICAL DATA:  Tooth extraction 1 week ago. Septic. Assess for source. EXAM: CT MAXILLOFACIAL WITH CONTRAST TECHNIQUE: Multidetector CT imaging of the maxillofacial structures was performed with intravenous contrast. Multiplanar CT image reconstructions were also generated. CONTRAST:  56m OMNIPAQUE IOHEXOL 300 MG/ML  SOLN COMPARISON:  CT HEAD June 21, 2011 FINDINGS: OSSEOUS: No acute facial fracture. The mandible is intact, the condyles are located. No destructive bony lesions. Multiple mandible periapical abscess without soft tissue component. Moderate RIGHT C3-4 facet arthropathy. ORBITS: Ocular globes and orbital contents are non suspicious. Status post RIGHT ocular lens implant. SINUSES: Paranasal  sinuses are well aerated. Intact nasal septum is midline. Mastoid aircells are well aerated. SOFT TISSUES: No significant soft tissue swelling. No subcutaneous gas or radiopaque foreign bodies. Moderate calcific atherosclerosis carotid bifurcations. LIMITED INTRACRANIAL: Nonacute. Severe calcific atherosclerosis carotid siphons. IMPRESSION: 1. No acute process in the face. 2. Multiple periapical abscess without fluid collection. Electronically Signed   By: CThana FarrD.  On: 10/11/2018 17:23    ASSESSMENT AND PLAN:   Active Problems:   Sepsis ([place])   * Sepsis, unclear etiology.  Possible related to tooth extraction, rule out bacteremia. cx negative so far. Continue cefepime, vancomycin and Flagyl IV, follow-up CBC and cultures.  CT abd with contrast.  * Acute renal failure on CKD stage 3.  Hold metformin, diuretics and lisinopril, IV fluid support and follow-up BMP.  Slow improvement.  * Hyponatremia due to dehydration, normal saline IV and follow-up BMP.  * Hypertension.  Hold diuretics and lisinopril due to renal failure.  * Diabetes.  Continue Lantus and start sliding scale.  Hold metformin.    All the records are reviewed and case discussed with Care Management/Social Workerr. Management plans discussed with the patient, family and they are in agreement.  CODE STATUS: Full.  TOTAL TIME TAKING CARE OF THIS PATIENT: 35 minutes.     POSSIBLE D/C IN 1-2 DAYS, DEPENDING ON CLINICAL CONDITION.   Vaughan Basta M.D on 10/12/2018   Between 7am to 6pm - Pager - [phone]  After 6pm go to [email or web address] - password [email or web address] [facility]  Office  [phone]  CC: Primary care physician; Leone Haven, MD  Note: This dictation was prepared with Dragon dictation along with smaller phrase technology. Any transcriptional errors that result from this process are unintentional.

## 2018-10-12 NOTE — Care Management Important Message (Signed)
Copy of signed IM left with patient in room.

## 2018-10-13 LAB — GLUCOSE, CAPILLARY
GLUCOSE-CAPILLARY: 153 mg/dL — AB (ref 70–99)
GLUCOSE-CAPILLARY: 172 mg/dL — AB (ref 70–99)
GLUCOSE-CAPILLARY: 188 mg/dL — AB (ref 70–99)
Glucose-Capillary: 96 mg/dL (ref 70–99)
Glucose-Capillary: 139 mg/dL — ABNORMAL HIGH (ref 70–99)

## 2018-10-13 LAB — CBC
HEMATOCRIT: 28.1 % — AB (ref 39.0–52.0)
Hemoglobin: 8.8 g/dL — ABNORMAL LOW (ref 13.0–17.0)
MCH: 29.5 pg (ref 26.0–34.0)
MCHC: 31.3 g/dL (ref 30.0–36.0)
MCV: 94.3 fL (ref 80.0–100.0)
NRBC: 0 % (ref 0.0–0.2)
Platelets: 197 10*3/uL (ref 150–400)
RBC: 2.98 MIL/uL — AB (ref 4.22–5.81)
RDW: 15.1 % (ref 11.5–15.5)
WBC: 16 10*3/uL — ABNORMAL HIGH (ref 4.0–10.5)

## 2018-10-13 NOTE — Plan of Care (Signed)
  Problem: Clinical Measurements: Goal: Ability to maintain clinical measurements within normal limits will improve Outcome: Progressing   Problem: Activity: Goal: Risk for activity intolerance will decrease Outcome: Progressing   Problem: Safety: Goal: Ability to remain free from injury will improve Outcome: Progressing   Problem: Clinical Measurements: Goal: Diagnostic test results will improve Outcome: Progressing Goal: Signs and symptoms of infection will decrease Outcome: Progressing

## 2018-10-13 NOTE — Plan of Care (Signed)
  Problem: Education: Goal: Knowledge of General Education information will improve Description Including pain rating scale, medication(s)/side effects and non-pharmacologic comfort measures Outcome: Progressing   Problem: Clinical Measurements: Goal: Cardiovascular complication will be avoided Outcome: Progressing   Problem: Safety: Goal: Ability to remain free from injury will improve Outcome: Progressing

## 2018-10-13 NOTE — Progress Notes (Signed)
[facility] [place] at [place] NAME: Richard Gordon    MR#:  [id number]  DATE OF BIRTH:  1950-12-28  SUBJECTIVE:  Patient feeling better, no complaints  REVIEW OF SYSTEMS:  CONSTITUTIONAL: had fever,no fatigue or weakness.  EYES: No blurred or double vision.  EARS, NOSE, AND THROAT: No tinnitus or ear pain.  RESPIRATORY: No cough, shortness of breath, wheezing or hemoptysis.  CARDIOVASCULAR: No chest pain, orthopnea, edema.  GASTROINTESTINAL: have nausea, vomiting, no diarrhea or abdominal pain.  GENITOURINARY: No dysuria, hematuria.  ENDOCRINE: No polyuria, nocturia,  HEMATOLOGY: No anemia, easy bruising or bleeding SKIN: No rash or lesion. MUSCULOSKELETAL: No joint pain or arthritis.   NEUROLOGIC: No tingling, numbness, weakness.  PSYCHIATRY: No anxiety or depression.   ROS  DRUG ALLERGIES:  No Known Allergies  VITALS:  Blood pressure (!) 151/64, pulse 85, temperature 98.5 F (36.9 C), temperature source Oral, resp. rate 19, height 5' 10" (1.778 m), weight 79.4 kg, SpO2 98 %.  PHYSICAL EXAMINATION:  GENERAL:  67 y.o.-year-old patient lying in the bed with no acute distress.  EYES: Pupils equal, round, reactive to light and accommodation. No scleral icterus. Extraocular muscles intact.  HEENT: Head atraumatic, normocephalic. Oropharynx and nasopharynx clear. Poor oral hyegiene. NECK:  Supple, no jugular venous distention. No thyroid enlargement, no tenderness.  LUNGS: Normal breath sounds bilaterally, no wheezing, rales,rhonchi or crepitation. No use of accessory muscles of respiration.  CARDIOVASCULAR: S1, S2 normal. No murmurs, rubs, or gallops.  ABDOMEN: Soft, mild tender, nondistended. Bowel sounds present. No organomegaly or mass.  EXTREMITIES: No pedal edema, cyanosis, or clubbing.  NEUROLOGIC: Cranial nerves II through XII are intact. Muscle strength 5/5 in all extremities. Sensation intact. Gait not checked.  PSYCHIATRIC: The patient is  alert and oriented x 3.  SKIN: No obvious rash, lesion, or ulcer.   Physical Exam LABORATORY PANEL:   CBC Recent Labs  Lab 10/13/18 0446  WBC 16.0*  HGB 8.8*  HCT 28.1*  PLT 197   ------------------------------------------------------------------------------------------------------------------  Chemistries  Recent Labs  Lab 10/12/18 0516  NA 136  K 3.5  CL 109  CO2 21*  GLUCOSE 114*  BUN 31*  CREATININE 1.02  CALCIUM 7.5*  AST 72*  ALT 61*  ALKPHOS 192*  BILITOT 1.0   ------------------------------------------------------------------------------------------------------------------  Cardiac Enzymes No results for input(s): TROPONINI in the last 168 hours. ------------------------------------------------------------------------------------------------------------------  RADIOLOGY:  Ct Maxillofacial W Contrast  Result Date: 10/11/2018 CLINICAL DATA:  Tooth extraction 1 week ago. Septic. Assess for source. EXAM: CT MAXILLOFACIAL WITH CONTRAST TECHNIQUE: Multidetector CT imaging of the maxillofacial structures was performed with intravenous contrast. Multiplanar CT image reconstructions were also generated. CONTRAST:  64m OMNIPAQUE IOHEXOL 300 MG/ML  SOLN COMPARISON:  CT HEAD June 21, 2011 FINDINGS: OSSEOUS: No acute facial fracture. The mandible is intact, the condyles are located. No destructive bony lesions. Multiple mandible periapical abscess without soft tissue component. Moderate RIGHT C3-4 facet arthropathy. ORBITS: Ocular globes and orbital contents are non suspicious. Status post RIGHT ocular lens implant. SINUSES: Paranasal sinuses are well aerated. Intact nasal septum is midline. Mastoid aircells are well aerated. SOFT TISSUES: No significant soft tissue swelling. No subcutaneous gas or radiopaque foreign bodies. Moderate calcific atherosclerosis carotid bifurcations. LIMITED INTRACRANIAL: Nonacute. Severe calcific atherosclerosis carotid siphons. IMPRESSION: 1. No  acute process in the face. 2. Multiple periapical abscess without fluid collection. Electronically Signed   By: CElon AlasM.D.   On: 10/11/2018 17:23    ASSESSMENT AND PLAN:  *Acute  sepsis Likely secondary to multiple abscesses regarding teeth, but cannot exclude possible endocarditis Resolved Continue empiric cefepime, vancomycin, Flagyl, follow-up on outstanding cultures, TEE for further evaluation as current transthoracic echocardiogram noted for diastolic dysfunction stage I ID input greatly appreciated  * Acute renal failure on CKD stage 3 Continue to hold metformin while in house Avoid nephrotoxic agents, strict I&O monitoring, daily weights,  * Hyponatremia due to dehydration Resolved with IV fluids for rehydration  * Hypertension Hold diuretics and lisinopril due to renal failure  * Diabetes Controlled on current regiment  All the records are reviewed and case discussed with Care Management/Social Workerr. Management plans discussed with the patient, family and they are in agreement.  CODE STATUS: Full.  TOTAL TIME TAKING CARE OF THIS PATIENT: 35 minutes.     POSSIBLE D/C IN 1-2 DAYS, DEPENDING ON CLINICAL CONDITION.   Richard Gordon M.D on 10/13/2018   Between 7am to 6pm - Pager - [phone]  After 6pm go to [email or web address] - password [email or web address] [facility]  Office  [phone]  CC: Primary care physician; Leone Haven, MD  Note: This dictation was prepared with Dragon dictation along with smaller phrase technology. Any transcriptional errors that result from this process are unintentional.

## 2018-10-14 LAB — CULTURE, BLOOD (ROUTINE X 2)
Culture: NO GROWTH
Culture: NO GROWTH
Special Requests: ADEQUATE
Special Requests: ADEQUATE

## 2018-10-14 LAB — CBC
HCT: 30.1 % — ABNORMAL LOW (ref 39.0–52.0)
HEMOGLOBIN: 9.6 g/dL — AB (ref 13.0–17.0)
MCH: 30.1 pg (ref 26.0–34.0)
MCHC: 31.9 g/dL (ref 30.0–36.0)
MCV: 94.4 fL (ref 80.0–100.0)
NRBC: 0.1 % (ref 0.0–0.2)
PLATELETS: 211 10*3/uL (ref 150–400)
RBC: 3.19 MIL/uL — AB (ref 4.22–5.81)
RDW: 14.7 % (ref 11.5–15.5)
WBC: 16.7 10*3/uL — ABNORMAL HIGH (ref 4.0–10.5)

## 2018-10-14 LAB — BASIC METABOLIC PANEL
Anion gap: 8 (ref 5–15)
BUN: 16 mg/dL (ref 8–23)
CO2: 23 mmol/L (ref 22–32)
Calcium: 7.5 mg/dL — ABNORMAL LOW (ref 8.9–10.3)
Chloride: 106 mmol/L (ref 98–111)
Creatinine, Ser: 0.72 mg/dL (ref 0.61–1.24)
GFR calc Af Amer: >60 mL/min (ref >60)
GFR calc non Af Amer: >60 mL/min (ref >60)
Glucose, Bld: 109 mg/dL — ABNORMAL HIGH (ref 70–99)
Potassium: 3.2 mmol/L — ABNORMAL LOW (ref 3.5–5.1)
Sodium: 137 mmol/L (ref 135–145)

## 2018-10-14 LAB — VANCOMYCIN, TROUGH: VANCOMYCIN TR: 20 ug/mL (ref 15–20)

## 2018-10-14 LAB — GLUCOSE, CAPILLARY
GLUCOSE-CAPILLARY: 121 mg/dL — AB (ref 70–99)
Glucose-Capillary: 124 mg/dL — ABNORMAL HIGH (ref 70–99)

## 2018-10-14 MED ORDER — POTASSIUM CHLORIDE CRYS ER 20 MEQ PO TBCR
40.0000 meq | EXTENDED_RELEASE_TABLET | Freq: Once | ORAL | Status: AC
  Administered 2018-10-14: 40 meq via ORAL
  Filled 2018-10-14: qty 2

## 2018-10-14 MED ORDER — DILTIAZEM HCL 30 MG PO TABS: 30.0000 mg | ORAL_TABLET | Freq: Three times a day (TID) | ORAL | 0 refills | Status: DC

## 2018-10-14 MED ORDER — GLUCERNA SHAKE PO LIQD: 237.0000 mL | Freq: Three times a day (TID) | ORAL | 0 refills | Status: DC

## 2018-10-14 MED ORDER — CEFDINIR 300 MG PO CAPS: 300.0000 mg | ORAL_CAPSULE | Freq: Two times a day (BID) | ORAL | 0 refills | Status: DC

## 2018-10-14 MED ORDER — METRONIDAZOLE 500 MG PO TABS: 500.0000 mg | ORAL_TABLET | Freq: Three times a day (TID) | ORAL | 0 refills | Status: DC

## 2018-10-14 NOTE — Consult Note (Signed)
[facility] Cardiology  CARDIOLOGY CONSULT NOTE  Patient ID: Richard Gordon MRN: [id number] DOB/AGE: [birth year] 67 y.o.  Admit date: 10/09/2018 Referring Physician Salary Primary Physician Krebs Primary Cardiologist  Reason for Consultation evaluation for TEE  HPI: 67 year old gentleman referred for evaluation for possible transesophageal echocardiogram.  He presented to [facility] ED 10/09/2018 1 week history of intermittent nausea, generalized body aches, and weakness, following tooth extraction.  Ultrasound been negative.  Patient has been treated with wide spectrum antibiotics.  Echocardiogram 10/12/2018 revealed normal left ventricular function, with LVEF of 6065%, with mild mitral regurgitation.  She was seen by Infectious Disease who recommends transesophageal echocardiogram to rule out infectious endocarditis.  Review of systems complete and found to be negative unless listed above     Past Medical History:  Diagnosis Date  . Chronic kidney disease (CKD), stage III (moderate) ([place]) 11/03/2015  . Diabetes mellitus without complication ([place])   . Hyperlipidemia   . Hypertension   . Stroke [facility])    per pt 2 CVA in 2000,and 1998 (  R ,then L side effected seperately per pt )    Past Surgical History:  Procedure Laterality Date  . CATARACT EXTRACTION    . TONSILLECTOMY      Medications Prior to Admission  Medication Sig Dispense Refill Last Dose  . aspirin EC 81 MG tablet Take 1 tablet (81 mg total) by mouth daily.   10/08/2018 at 0800  . blood glucose meter kit and supplies KIT Accu chek. Check twice daily. E11.42. 1 each 0 Taking  . empagliflozin (JARDIANCE) 10 MG TABS tablet Take 10 mg by mouth daily. 90 tablet 3 10/08/2018 at 0800  . glucose blood (ONETOUCH VERIO) test strip Check blood sugar twice daily Dx: E11.42 100 each 12 Taking  . hydrochlorothiazide (HYDRODIURIL) 12.5 MG tablet TAKE 1 TABLET DAILY (Patient taking differently: Take 12.5 mg by mouth daily. ) 90 tablet 0 10/08/2018 at 0800   . Insulin Glargine (BASAGLAR KWIKPEN) 100 UNIT/ML SOPN Inject 0.42 mLs (42 Units total) into the skin at bedtime. (Patient taking differently: Inject 41 Units into the skin at bedtime. ) 5 pen 3 10/08/2018 at 2000  . Insulin Pen Needle 31G X 5 MM MISC 1 each by Does not apply route daily. 100 each 3 Taking  . Lancets (ACCU-CHEK SOFT TOUCH) lancets Check blood sugar twice daily. DX: E11.42.  Quantity Sufficient 90 day supply 300 each 3 Taking  . lisinopril (PRINIVIL,ZESTRIL) 20 MG tablet TAKE 1 TABLET DAILY (Patient taking differently: Take 20 mg by mouth daily. ) 90 tablet 1 10/08/2018 at 0800  . metFORMIN (GLUCOPHAGE) 1000 MG tablet Take 1 tablet (1,000 mg total) by mouth 2 (two) times daily. (Patient taking differently: Take 1,000 mg by mouth daily with breakfast. ) 180 tablet 3 10/08/2018 at 0800  . simvastatin (ZOCOR) 5 MG tablet Take 1 tablet (5 mg total) by mouth daily at 6 PM. 90 tablet 3 10/08/2018 at 1800  . cyclobenzaprine (FLEXERIL) 5 MG tablet Take 1 tablet (5 mg total) by mouth 3 (three) times daily as needed for muscle spasms. (Patient not taking: Reported on 10/09/2018) 30 tablet 0 Not Taking at Unknown time  . ondansetron (ZOFRAN ODT) 4 MG disintegrating tablet Take 1 tablet (4 mg total) by mouth every 8 (eight) hours as needed for nausea or vomiting. (Patient not taking: Reported on 10/09/2018) 20 tablet 0 Not Taking at Unknown time   Social History   Socioeconomic History  . Marital status: Single    Spouse  name: Not on file  . Number of children: Not on file  . Years of education: Not on file  . Highest education level: Not on file  Occupational History  . Not on file  Social Needs  . Financial resource strain: Not on file  . Food insecurity:    Worry: Not on file    Inability: Not on file  . Transportation needs:    Medical: Not on file    Non-medical: Not on file  Tobacco Use  . Smoking status: Former Smoker    Packs/day: 0.50    Years: 25.00    Pack years: 12.50     Types: Cigarettes    Last attempt to quit: 04/04/1999    Years since quitting: 19.5  . Smokeless tobacco: Former Systems developer    Types: Snuff  Substance and Sexual Activity  . Alcohol use: No    Alcohol/week: 0.0 standard drinks  . Drug use: No  . Sexual activity: Not Currently    Partners: Female  Lifestyle  . Physical activity:    Days per week: Not on file    Minutes per session: Not on file  . Stress: Not on file  Relationships  . Social connections:    Talks on phone: Not on file    Gets together: Not on file    Attends religious service: Not on file    Active member of club or organization: Not on file    Attends meetings of clubs or organizations: Not on file    Relationship status: Not on file  . Intimate partner violence:    Fear of current or ex partner: Not on file    Emotionally abused: Not on file    Physically abused: Not on file    Forced sexual activity: Not on file  Other Topics Concern  . Not on file  Social History Narrative   ** Merged History Encounter **        Family History  Problem Relation Age of Onset  . Hypertension Father   . Diabetes Father   . Brain cancer Mother        per pt he was 60 and thinks this was cancer   . Coronary artery disease Sister        scarlet fever as child affected heart per pt      Review of systems complete and found to be negative unless listed above      PHYSICAL EXAM  General: Well developed, well nourished, in no acute distress HEENT:  Normocephalic and atramatic Neck:  No JVD.  Lungs: Clear bilaterally to auscultation and percussion. Heart: HRRR . Normal S1 and S2 without gallops or murmurs.  Abdomen: Bowel sounds are positive, abdomen soft and non-tender  Msk:  Back normal, normal gait. Normal strength and tone for age. Extremities: No clubbing, cyanosis or edema.   Neuro: Alert and oriented X 3. Psych:  Good affect, responds appropriately  Labs:   Lab Results  Component Value Date   WBC 16.7 (H)  10/14/2018   HGB 9.6 (L) 10/14/2018   HCT 30.1 (L) 10/14/2018   MCV 94.4 10/14/2018   PLT 211 10/14/2018    Recent Labs  Lab 10/12/18 0516 10/14/18 0801  NA 136 137  K 3.5 3.2*  CL 109 106  CO2 21* 23  BUN 31* 16  CREATININE 1.02 0.72  CALCIUM 7.5* 7.5*  PROT 5.1*  --   BILITOT 1.0  --   ALKPHOS 192*  --  ALT 61*  --   AST 72*  --   GLUCOSE 114* 109*   Lab Results  Component Value Date   CKTOTAL 41 (L) 10/09/2018   TROPONINI <0.03 08/23/2018    Lab Results  Component Value Date   CHOL 105 02/26/2018   CHOL 103 12/11/2017   CHOL 103 07/04/2017   Lab Results  Component Value Date   HDL 23.70 (L) 02/26/2018   HDL 23.40 (L) 12/11/2017   HDL 18.50 (L) 07/04/2017   Lab Results  Component Value Date   LDLCALC 40 08/15/2016   LDLCALC 30 05/09/2016   LDLCALC 17 02/05/2016   Lab Results  Component Value Date   TRIG 255.0 (H) 02/26/2018   TRIG 307.0 (H) 12/11/2017   TRIG (H) 07/04/2017    410.0 Triglyceride is over 400; calculations on Lipids are invalid.   Lab Results  Component Value Date   CHOLHDL 4 02/26/2018   CHOLHDL 4 12/11/2017   CHOLHDL 6 07/04/2017   Lab Results  Component Value Date   LDLDIRECT 38.0 02/26/2018   LDLDIRECT 31.0 12/11/2017   LDLDIRECT 34.0 07/04/2017      Radiology: Dg Chest 2 View  Result Date: 10/10/2018 CLINICAL DATA:  Fever. EXAM: CHEST - 2 VIEW COMPARISON:  10/09/2018. FINDINGS: Mediastinum and hilar structures normal. Heart size stable. Mild bibasilar atelectasis. Mild left base infiltrate. No pleural effusion or pneumothorax. IMPRESSION: Mild bibasilar atelectasis.  Mild left base infiltrate. Electronically Signed   By: [name]  Register   On: 10/10/2018 12:54   Dg Chest 2 View  Result Date: 10/09/2018 CLINICAL DATA:  Generalized body aches. EXAM: CHEST - 2 VIEW COMPARISON:  Radiographs of March 14, 2011. FINDINGS: The heart size and mediastinal contours are within normal limits. Both lungs are clear. No pneumothorax or  pleural effusion is noted. The visualized skeletal structures are unremarkable. IMPRESSION: No active cardiopulmonary disease. Electronically Signed   By: Marijo Conception, M.D.   On: 10/09/2018 10:37   Ct Abdomen Pelvis W Contrast  Result Date: 10/10/2018 CLINICAL DATA:  Sepsis.  Fever of unknown origin. EXAM: CT ABDOMEN AND PELVIS WITH CONTRAST TECHNIQUE: Multidetector CT imaging of the abdomen and pelvis was performed using the standard protocol following bolus administration of intravenous contrast. CONTRAST:  179m ISOVUE-300 IOPAMIDOL (ISOVUE-300) INJECTION 61% COMPARISON:  None. FINDINGS: Lower chest: Coronary artery calcification is evident. 9 mm short axis paraesophageal lymph node identified image 3/series 2. 4 mm right lower lobe pulmonary nodule identified (10/4). Hepatobiliary: No focal abnormality within the liver parenchyma. There is no evidence for gallstones, gallbladder wall thickening, or pericholecystic fluid. No intrahepatic or extrahepatic biliary dilation. Pancreas: Subtle peripancreatic edema evident with a suggestion of edema within the head of pancreas. No dilatation of the main pancreatic duct. Spleen: No splenomegaly. No focal mass lesion. Adrenals/Urinary Tract: No adrenal nodule or mass although there is surrounding stranding of both adrenal glands. Perinephric edema noted bilaterally without hydronephrosis or focal renal mass lesion. No evidence for hydroureter. The urinary bladder appears normal for the degree of distention. Stomach/Bowel: Stomach is nondistended. No gastric wall thickening. No evidence of outlet obstruction. Duodenum is normally positioned as is the ligament of Treitz. No small bowel wall thickening. No small bowel dilatation. The terminal ileum is normal. The appendix is normal. No gross colonic mass. No colonic wall thickening. No substantial diverticular change. Vascular/Lymphatic: There is abdominal aortic atherosclerosis without aneurysm. Portal vein and  superior mesenteric vein are patent. Splenic vein is patent. There is no gastrohepatic or hepatoduodenal  ligament lymphadenopathy. No intraperitoneal or retroperitoneal lymphadenopathy. Upper normal portal caval lymph node evident. Small para-aortic lymph nodes are associated. No pelvic sidewall lymphadenopathy. Reproductive: The prostate gland and seminal vesicles have normal imaging features. Other: No intraperitoneal free fluid. Musculoskeletal: Small bilateral groin hernias contain only fat. Tiny gas bubbles in the subcutaneous fat of the right paramidline anterior abdominal wall likely related to an injection site. No worrisome lytic or sclerotic osseous abnormality. Heterogeneous bony mineralization likely related to the patient's known chronic kidney disease. IMPRESSION: 1. Soft tissue stranding is identified in the retroperitoneal space of the abdomen, diffusely around the pancreas, adrenal glands, and kidneys. These changes are fairly evenly distributed without a definite epicenter making the appearance indeterminate. Changes around the pancreas could be related to pancreatitis and the perirenal edema could reflect pyelonephritis. Bilateral symmetric periadrenal edema/stranding is of uncertain significance. 2. No evidence for discrete, focal abscess in the abdomen/pelvis. 3. 4 mm right lower lobe pulmonary nodule. No follow-up needed if patient is low-risk. Non-contrast chest CT can be considered in 12 months if patient is high-risk. This recommendation follows the consensus statement: Guidelines for Management of Incidental Pulmonary Nodules Detected on CT Images: From the Fleischner Society 2017; Radiology 2017; 284:228-243. 4.  Aortic Atherosclerois (ICD10-170.0) Electronically Signed   By: Misty Stanley M.D.   On: 10/10/2018 17:00   Ct Maxillofacial W Contrast  Result Date: 10/11/2018 CLINICAL DATA:  Tooth extraction 1 week ago. Septic. Assess for source. EXAM: CT MAXILLOFACIAL WITH CONTRAST  TECHNIQUE: Multidetector CT imaging of the maxillofacial structures was performed with intravenous contrast. Multiplanar CT image reconstructions were also generated. CONTRAST:  15m OMNIPAQUE IOHEXOL 300 MG/ML  SOLN COMPARISON:  CT HEAD June 21, 2011 FINDINGS: OSSEOUS: No acute facial fracture. The mandible is intact, the condyles are located. No destructive bony lesions. Multiple mandible periapical abscess without soft tissue component. Moderate RIGHT C3-4 facet arthropathy. ORBITS: Ocular globes and orbital contents are non suspicious. Status post RIGHT ocular lens implant. SINUSES: Paranasal sinuses are well aerated. Intact nasal septum is midline. Mastoid aircells are well aerated. SOFT TISSUES: No significant soft tissue swelling. No subcutaneous gas or radiopaque foreign bodies. Moderate calcific atherosclerosis carotid bifurcations. LIMITED INTRACRANIAL: Nonacute. Severe calcific atherosclerosis carotid siphons. IMPRESSION: 1. No acute process in the face. 2. Multiple periapical abscess without fluid collection. Electronically Signed   By: CElon AlasM.D.   On: 10/11/2018 17:23    EKG: Sinus tachycardia  ASSESSMENT AND PLAN:   1.  Sepsis, following tooth extraction, with negative blood cultures.  Surface 2D echocardiogram reveals normal left ventricular function, with mild mitral regurgitation, without evidence for infectious endocarditis.  Patient been evaluated by infectious disease, who recommends TEE.  Patient reports he is being discharged home today, but is willing to undergo TEE as outpatient.  Recommendations  1.  Agree with overall current therapy 2.  Proceed with transesophageal echocardiogram, to be performed as outpatient if patient discharged home today.  Signed: AIsaias CowmanMD,PhD, FSanta Monica Surgical [facility]11/09/2018, 9:11 AM

## 2018-10-14 NOTE — Discharge Summary (Signed)
[facility] [place] NAME: Richard Gordon    MR#:  [id number]  DATE OF BIRTH:  1951/06/14  DATE OF ADMISSION:  10/09/2018 ADMITTING PHYSICIAN: Demetrios Loll, MD  DATE OF DISCHARGE: No discharge date for patient encounter.  PRIMARY CARE PHYSICIAN: Leone Haven, MD    ADMISSION DIAGNOSIS:  Myalgia [M79.10] Sepsis, due to unspecified organism, unspecified whether acute organ dysfunction present ([place]) [A41.9] Sepsis ([place]) [A41.9]  DISCHARGE DIAGNOSIS:  Active Problems:   Sepsis ([place])   SECONDARY DIAGNOSIS:   Past Medical History:  Diagnosis Date  . Chronic kidney disease (CKD), stage III (moderate) ([place]) 11/03/2015  . Diabetes mellitus without complication ([place])   . Hyperlipidemia   . Hypertension   . Stroke ([place])    per pt 2 CVA in 2000,and 1998 (  R ,then L side effected seperately per pt )    HOSPITAL COURSE:  *Acute sepsis Resolved Likely secondary to multiple abscesses regarding teeth, but cannot exclude possible endocarditis Treated on our sepsis protocol, provided empiric cefepime, vancomycin, Flagyl, TTE noted for diastolic dysfunction stage I, TEE was ordered, cardiology consulted, infectious disease did see patient while in house, on day of discharge patient demanded to go home despite risk of worsening/death    * Acute renal failure on CKDstage 3 Resolved Troponin held while in house, avoided nephrotoxic agents  * Hyponatremia due to dehydration Resolved with IV fluids for rehydration  * Hypertension Stable Held diuretics and lisinopril due to AKI while in house   * Diabetes Controlled on current regiment   DISCHARGE CONDITIONS:   guarded  CONSULTS OBTAINED:  Treatment Team:  Tsosie Billing, MD Isaias Cowman, MD  DRUG ALLERGIES:  No Known Allergies  DISCHARGE MEDICATIONS:   Allergies as of 10/14/2018   No Known Allergies     Medication List    TAKE these medications    accu-chek soft touch lancets Check blood sugar twice daily. DX: E11.42.  Quantity Sufficient 90 day supply   aspirin EC 81 MG tablet Take 1 tablet (81 mg total) by mouth daily.   BASAGLAR KWIKPEN 100 UNIT/ML Sopn Inject 0.42 mLs (42 Units total) into the skin at bedtime. What changed:  how much to take   blood glucose meter kit and supplies Kit Accu chek. Check twice daily. E11.42.   cefdinir 300 MG capsule Commonly known as:  OMNICEF Take 1 capsule (300 mg total) by mouth 2 (two) times daily.   cyclobenzaprine 5 MG tablet Commonly known as:  FLEXERIL Take 1 tablet (5 mg total) by mouth 3 (three) times daily as needed for muscle spasms.   diltiazem 30 MG tablet Commonly known as:  CARDIZEM Take 1 tablet (30 mg total) by mouth every 8 (eight) hours.   empagliflozin 10 MG Tabs tablet Commonly known as:  JARDIANCE Take 10 mg by mouth daily.   feeding supplement (GLUCERNA SHAKE) Liqd Take 237 mLs by mouth 3 (three) times daily between meals.   glucose blood test strip Check blood sugar twice daily Dx: E11.42   hydrochlorothiazide 12.5 MG tablet Commonly known as:  HYDRODIURIL TAKE 1 TABLET DAILY   Insulin Pen Needle 31G X 5 MM Misc 1 each by Does not apply route daily.   lisinopril 20 MG tablet Commonly known as:  PRINIVIL,ZESTRIL TAKE 1 TABLET DAILY   metFORMIN 1000 MG tablet Commonly known as:  GLUCOPHAGE Take 1 tablet (1,000 mg total) by mouth 2 (two) times daily. What changed:  when to take  this   metroNIDAZOLE 500 MG tablet Commonly known as:  FLAGYL Take 1 tablet (500 mg total) by mouth 3 (three) times daily.   ondansetron 4 MG disintegrating tablet Commonly known as:  ZOFRAN-ODT Take 1 tablet (4 mg total) by mouth every 8 (eight) hours as needed for nausea or vomiting.   simvastatin 5 MG tablet Commonly known as:  ZOCOR Take 1 tablet (5 mg total) by mouth daily at 6 PM.        DISCHARGE INSTRUCTIONS:  If you experience worsening of your  admission symptoms, develop shortness of breath, life threatening emergency, suicidal or homicidal thoughts you must seek medical attention immediately by calling 911 or calling your MD immediately  if symptoms less severe.  You Must read complete instructions/literature along with all the possible adverse reactions/side effects for all the Medicines you take and that have been prescribed to you. Take any new Medicines after you have completely understood and accept all the possible adverse reactions/side effects.   Please note  You were cared for by a hospitalist during your hospital stay. If you have any questions about your discharge medications or the care you received while you were in the hospital after you are discharged, you can call the unit and asked to speak with the hospitalist on call if the hospitalist that took care of you is not available. Once you are discharged, your primary care physician will handle any further medical issues. Please note that NO REFILLS for any discharge medications will be authorized once you are discharged, as it is imperative that you return to your primary care physician (or establish a relationship with a primary care physician if you do not have one) for your aftercare needs so that they can reassess your need for medications and monitor your lab values.    Today   CHIEF COMPLAINT:   Chief Complaint  Patient presents with  . Generalized Body Aches    HISTORY OF PRESENT ILLNESS:  67 y.o. male with a known history of hypertension, diabetes, hyperlipidemia, CKD stage III and CVA.  The patient presented to ED with above chief complaints.  The patient had a tooth extraction 1 week ago.  He has had body aching and intermittent nausea since that time.  He is black sugar is also elevated.  He is found tachycardia, tachypnea and leukocytosis in the ED.  Sepsis protocol is a started.  He is being treated with antibiotics in the ED.  Chest x-ray is unremarkable.   Urinalysis is normal. VITAL SIGNS:  Blood pressure (!) 169/75, pulse 92, temperature 98.7 F (37.1 C), temperature source Oral, resp. rate 20, height 5' 10" (1.778 m), weight 79.4 kg, SpO2 98 %.  I/O:    Intake/Output Summary (Last 24 hours) at 10/14/2018 0954 Last data filed at 10/14/2018 0700 Gross per 24 hour  Intake 4987.26 ml  Output 2626 ml  Net 2361.26 ml    PHYSICAL EXAMINATION:  GENERAL:  67 y.o.-year-old patient lying in the bed with no acute distress.  EYES: Pupils equal, round, reactive to light and accommodation. No scleral icterus. Extraocular muscles intact.  HEENT: Head atraumatic, normocephalic. Oropharynx and nasopharynx clear.  NECK:  Supple, no jugular venous distention. No thyroid enlargement, no tenderness.  LUNGS: Normal breath sounds bilaterally, no wheezing, rales,rhonchi or crepitation. No use of accessory muscles of respiration.  CARDIOVASCULAR: S1, S2 normal. No murmurs, rubs, or gallops.  ABDOMEN: Soft, non-tender, non-distended. Bowel sounds present. No organomegaly or mass.  EXTREMITIES: No pedal edema,  cyanosis, or clubbing.  NEUROLOGIC: Cranial nerves II through XII are intact. Muscle strength 5/5 in all extremities. Sensation intact. Gait not checked.  PSYCHIATRIC: The patient is alert and oriented x 3.  SKIN: No obvious rash, lesion, or ulcer.   DATA REVIEW:   CBC Recent Labs  Lab 10/14/18 0801  WBC 16.7*  HGB 9.6*  HCT 30.1*  PLT 211    Chemistries  Recent Labs  Lab 10/12/18 0516 10/14/18 0801  NA 136 137  K 3.5 3.2*  CL 109 106  CO2 21* 23  GLUCOSE 114* 109*  BUN 31* 16  CREATININE 1.02 0.72  CALCIUM 7.5* 7.5*  AST 72*  --   ALT 61*  --   ALKPHOS 192*  --   BILITOT 1.0  --     Cardiac Enzymes No results for input(s): TROPONINI in the last 168 hours.  Microbiology Results  Results for orders placed or performed during the hospital encounter of 10/09/18  Blood culture (routine x 2)     Status: None   Collection Time:  10/09/18  9:45 AM  Result Value Ref Range Status   Specimen Description BLOOD LEFT ANTECUBITAL  Final   Special Requests Blood Culture adequate volume  Final   Culture   Final    NO GROWTH 5 DAYS Performed at [facility], [address] [place], [place] [address]    Report Status 10/14/2018 FINAL  Final  Blood culture (routine x 2)     Status: None   Collection Time: 10/09/18 11:24 AM  Result Value Ref Range Status   Specimen Description BLOOD LEFT ANTECUBITAL  Final   Special Requests Blood Culture adequate volume  Final   Culture   Final    NO GROWTH 5 DAYS Performed at [facility], [address], [place], [place] [address]    Report Status 10/14/2018 FINAL  Final  Urine culture     Status: None   Collection Time: 10/09/18 11:24 AM  Result Value Ref Range Status   Specimen Description   Final    URINE, RANDOM Performed at [facility], [address] [place], [place] [address]    Special Requests   Final    NONE Performed at [facility], [address] [place], [place] [address]    Culture   Final    NO GROWTH Performed at [facility], [place] [address], [place], [place] [address]    Report Status 10/10/2018 FINAL  Final    RADIOLOGY:  No results found.  EKG:   Orders placed or performed during the hospital encounter of 10/09/18  . EKG 12-Lead  . EKG 12-Lead      Management plans discussed with the patient, family and they are in agreement.  CODE STATUS:     Code Status Orders  (From admission, onward)         Start     Ordered   10/09/18 2302  Full code  Continuous     10/09/18 2301        Code Status History    This patient has a current code status but no historical code status.      TOTAL TIME TAKING CARE OF THIS PATIENT: 40 minutes.    Avel Peace Salary M.D on 10/14/2018 at 9:54 AM  Between 7am to 6pm - Pager - [phone]  After 6pm go to [email or web address] - password [email or web address] [facility]  Office  [phone]  CC: Primary  care physician; Leone Haven, MD   Note: This dictation was prepared with Dragon dictation along with smaller phrase technology. Any transcriptional errors that result from this process are unintentional.

## 2018-10-14 NOTE — Plan of Care (Signed)
  Problem: Clinical Measurements: Goal: Ability to maintain clinical measurements within normal limits will improve Outcome: Progressing   Problem: Safety: Goal: Ability to remain free from injury will improve Outcome: Progressing   Problem: Clinical Measurements: Goal: Signs and symptoms of infection will decrease Outcome: Progressing

## 2018-10-14 NOTE — Progress Notes (Signed)
Pharmacy Antibiotic Note  Richard Gordon is a 67 y.o. male admitted on 10/09/2018 with sepsis.  Pharmacy has been consulted for Vancomycin/Cefepime dosing. Scr is stable and WBC > 30.   Plan: Patient has now had 2 therapeutic vanc troughs. Last trough resulted at 20, but level drawn about 10 hr after last dose, so true trough a little lower. Will check another level in 1 week unless major changes in renal function.   Recommend switching cefepime to ceftriaxone 2g daily since bcx neg for pseudomonas after 5 days. However it appears pt is demanding to be d/c  Height: 5' 10" (177.8 cm) Weight: 175 lb 0.7 oz (79.4 kg) IBW/kg (Calculated) : 73  Temp (24hrs), Avg:98.5 F (36.9 C), Min:98.2 F (36.8 C), Max:98.7 F (37.1 C)  Recent Labs  Lab 10/09/18 0945  10/09/18 1210 10/09/18 2308 10/10/18 0323 10/10/18 2309  10/11/18 0713 10/12/18 0516 10/12/18 2011 10/13/18 0446 10/14/18 0801  WBC  --    < >  --   --  30.5*  --   --  31.3* 24.3*  --  16.0* 16.7*  CREATININE  --    < >  --  1.41* 1.35*  --   --  1.33* 1.02  --   --  0.72  LATICACIDVEN 2.0*  --  0.9  --   --  0.9  --   --   --   --   --   --   VANCOTROUGH  --   --   --   --   --   --    < > 10*  --  19  --  20   < > = values in this interval not displayed.    Estimated Creatinine Clearance: 91.3 mL/min (by C-G formula based on SCr of 0.72 mg/dL).    No Known Allergies  Antimicrobials this admission:   >>    >>   Dose adjustments this admission:   Microbiology results:  BCx:   UCx:    Sputum:    MRSA PCR:   Thank you for allowing pharmacy to be a part of this patient's care.  Ramond Dial, Pharm.D, BCPS Clinical Pharmacist 10/14/2018 8:57 AM

## 2018-10-14 NOTE — Progress Notes (Signed)
CRITICAL VALUE ALERT  Critical Value:  K+ 3.2  Date & Time Notied:  10/14/18.0845  Provider Notified: Dr. Jerelyn Charles  Orders Received/Actions taken: n/a

## 2018-10-14 NOTE — Progress Notes (Signed)
Richard Gordon to be D/C'd Home per MD order.  Discussed prescriptions and follow up appointments with the patient. Prescriptions given to patient, medication list explained in detail. Pt verbalized understanding.  Allergies as of 10/14/2018   No Known Allergies     Medication List    TAKE these medications   accu-chek soft touch lancets Check blood sugar twice daily. DX: E11.42.  Quantity Sufficient 90 day supply   aspirin EC 81 MG tablet Take 1 tablet (81 mg total) by mouth daily.   BASAGLAR KWIKPEN 100 UNIT/ML Sopn Inject 0.42 mLs (42 Units total) into the skin at bedtime. What changed:  how much to take   blood glucose meter kit and supplies Kit Accu chek. Check twice daily. E11.42.   cefdinir 300 MG capsule Commonly known as:  OMNICEF Take 1 capsule (300 mg total) by mouth 2 (two) times daily.   cyclobenzaprine 5 MG tablet Commonly known as:  FLEXERIL Take 1 tablet (5 mg total) by mouth 3 (three) times daily as needed for muscle spasms.   diltiazem 30 MG tablet Commonly known as:  CARDIZEM Take 1 tablet (30 mg total) by mouth every 8 (eight) hours.   empagliflozin 10 MG Tabs tablet Commonly known as:  JARDIANCE Take 10 mg by mouth daily.   feeding supplement (GLUCERNA SHAKE) Liqd Take 237 mLs by mouth 3 (three) times daily between meals.   glucose blood test strip Check blood sugar twice daily Dx: E11.42   hydrochlorothiazide 12.5 MG tablet Commonly known as:  HYDRODIURIL TAKE 1 TABLET DAILY   Insulin Pen Needle 31G X 5 MM Misc 1 each by Does not apply route daily.   lisinopril 20 MG tablet Commonly known as:  PRINIVIL,ZESTRIL TAKE 1 TABLET DAILY   metFORMIN 1000 MG tablet Commonly known as:  GLUCOPHAGE Take 1 tablet (1,000 mg total) by mouth 2 (two) times daily. What changed:  when to take this   metroNIDAZOLE 500 MG tablet Commonly known as:  FLAGYL Take 1 tablet (500 mg total) by mouth 3 (three) times daily.   ondansetron 4 MG disintegrating  tablet Commonly known as:  ZOFRAN-ODT Take 1 tablet (4 mg total) by mouth every 8 (eight) hours as needed for nausea or vomiting.   simvastatin 5 MG tablet Commonly known as:  ZOCOR Take 1 tablet (5 mg total) by mouth daily at 6 PM.       Vitals:   10/14/18 0824 10/14/18 1114  BP: (!) 169/75 (!) 147/60  Pulse: 92 87  Resp: 20 18  Temp:  98.4 F (36.9 C)  SpO2: 98% 98%    Tele box and returned. Skin clean, dry and intact without evidence of skin break down, no evidence of skin tears noted. IV catheter discontinued intact. Site without signs and symptoms of complications. Dressing and pressure applied. Pt denies pain at this time. No complaints noted.  An After Visit Summary was printed and given to the patient. Patient escorted via [place] by NT, and D/C home via private auto.  Richard Gordon

## 2018-10-15 ENCOUNTER — Telehealth: Payer: Self-pay

## 2018-10-15 NOTE — Telephone Encounter (Signed)
Copied from [place] [phone] [id number]. Topic: General - Other >> Oct 15, 2018  7:30 AM Yvette Rack wrote: Reason for CRM: pt calling stating that he needed a hospital f/u I don't see any available because he only do one a day for those visits  he does have an appt on 10-31-18 do you want him to come in then to see [facility]

## 2018-10-15 NOTE — Telephone Encounter (Signed)
First attempt made for TCM will continue to call.

## 2018-10-15 NOTE — Telephone Encounter (Signed)
Transition Care Management Follow-up Telephone Call  How have you been since you were released from the hospital? Patient says he is doing well, he is able to walk better now .   Do you understand why you were in the hospital? yes   Do you understand the discharge instrcutions? yes  Items Reviewed:  Medications reviewed: yes  Allergies reviewed: yes  Dietary changes reviewed: yes  Referrals reviewed: yes   Functional Questionnaire:   Activities of Daily Living (ADLs):   He states they are independent in the following: ambulation, bathing and hygiene, feeding, continence, grooming, toileting and dressing States they require assistance with the following: No asssitance required at this time.   Any transportation issues/concerns?: no   Any patient concerns? no   Confirmed importance and date/time of follow-up visits scheduled: yes   Confirmed with patient if condition begins to worsen call PCP or go to the ER.  Patient was given the Call-a-Nurse line [phone]: yes

## 2018-10-15 NOTE — Telephone Encounter (Signed)
Called and left message for patient to return call to office I have held spot on 10/17/18 at 330 for HFU if patient can come in at that time.

## 2018-10-16 ENCOUNTER — Emergency Department: Payer: Medicare HMO

## 2018-10-16 ENCOUNTER — Inpatient Hospital Stay
Admission: EM | Admit: 2018-10-16 | Discharge: 2018-10-26 | DRG: 871 | Disposition: A | Discharge status: Other Acute Inpatient Hospital | Payer: Medicare HMO | Attending: Specialist | Admitting: Specialist

## 2018-10-16 ENCOUNTER — Telehealth: Payer: Self-pay

## 2018-10-16 ENCOUNTER — Ambulatory Visit: Payer: Self-pay

## 2018-10-16 ENCOUNTER — Other Ambulatory Visit: Payer: Self-pay

## 2018-10-16 DIAGNOSIS — I38 Endocarditis, valve unspecified: Secondary | ICD-10-CM | POA: Diagnosis not present

## 2018-10-16 DIAGNOSIS — C9200 Acute myeloblastic leukemia, not having achieved remission: Secondary | ICD-10-CM | POA: Diagnosis not present

## 2018-10-16 DIAGNOSIS — R5383 Other fatigue: Secondary | ICD-10-CM | POA: Diagnosis not present

## 2018-10-16 DIAGNOSIS — B9689 Other specified bacterial agents as the cause of diseases classified elsewhere: Secondary | ICD-10-CM | POA: Diagnosis not present

## 2018-10-16 DIAGNOSIS — D4989 Neoplasm of unspecified behavior of other specified sites: Secondary | ICD-10-CM | POA: Diagnosis not present

## 2018-10-16 DIAGNOSIS — M6250 Muscle wasting and atrophy, not elsewhere classified, unspecified site: Secondary | ICD-10-CM | POA: Diagnosis not present

## 2018-10-16 DIAGNOSIS — M545 Low back pain: Secondary | ICD-10-CM | POA: Diagnosis not present

## 2018-10-16 DIAGNOSIS — D649 Anemia, unspecified: Secondary | ICD-10-CM | POA: Diagnosis not present

## 2018-10-16 DIAGNOSIS — E785 Hyperlipidemia, unspecified: Secondary | ICD-10-CM | POA: Diagnosis not present

## 2018-10-16 DIAGNOSIS — E1122 Type 2 diabetes mellitus with diabetic chronic kidney disease: Secondary | ICD-10-CM | POA: Diagnosis not present

## 2018-10-16 DIAGNOSIS — Z8249 Family history of ischemic heart disease and other diseases of the circulatory system: Secondary | ICD-10-CM

## 2018-10-16 DIAGNOSIS — R262 Difficulty in walking, not elsewhere classified: Secondary | ICD-10-CM | POA: Diagnosis present

## 2018-10-16 DIAGNOSIS — Z9841 Cataract extraction status, right eye: Secondary | ICD-10-CM

## 2018-10-16 DIAGNOSIS — Z794 Long term (current) use of insulin: Secondary | ICD-10-CM | POA: Diagnosis not present

## 2018-10-16 DIAGNOSIS — R000 Tachycardia, unspecified: Secondary | ICD-10-CM | POA: Diagnosis not present

## 2018-10-16 DIAGNOSIS — C7951 Secondary malignant neoplasm of bone: Secondary | ICD-10-CM | POA: Diagnosis not present

## 2018-10-16 DIAGNOSIS — A419 Sepsis, unspecified organism: Secondary | ICD-10-CM | POA: Diagnosis not present

## 2018-10-16 DIAGNOSIS — Z87891 Personal history of nicotine dependence: Secondary | ICD-10-CM

## 2018-10-16 DIAGNOSIS — Z9842 Cataract extraction status, left eye: Secondary | ICD-10-CM | POA: Diagnosis not present

## 2018-10-16 DIAGNOSIS — R531 Weakness: Secondary | ICD-10-CM | POA: Diagnosis present

## 2018-10-16 DIAGNOSIS — R7881 Bacteremia: Secondary | ICD-10-CM | POA: Diagnosis not present

## 2018-10-16 DIAGNOSIS — G4700 Insomnia, unspecified: Secondary | ICD-10-CM | POA: Diagnosis present

## 2018-10-16 DIAGNOSIS — I10 Essential (primary) hypertension: Secondary | ICD-10-CM | POA: Diagnosis not present

## 2018-10-16 DIAGNOSIS — E876 Hypokalemia: Secondary | ICD-10-CM | POA: Diagnosis present

## 2018-10-16 DIAGNOSIS — C864 Blastic NK-cell lymphoma: Secondary | ICD-10-CM | POA: Diagnosis not present

## 2018-10-16 DIAGNOSIS — Z79899 Other long term (current) drug therapy: Secondary | ICD-10-CM

## 2018-10-16 DIAGNOSIS — E43 Unspecified severe protein-calorie malnutrition: Secondary | ICD-10-CM

## 2018-10-16 DIAGNOSIS — Z833 Family history of diabetes mellitus: Secondary | ICD-10-CM

## 2018-10-16 DIAGNOSIS — M79605 Pain in left leg: Secondary | ICD-10-CM | POA: Diagnosis not present

## 2018-10-16 DIAGNOSIS — R4182 Altered mental status, unspecified: Secondary | ICD-10-CM | POA: Diagnosis not present

## 2018-10-16 DIAGNOSIS — D638 Anemia in other chronic diseases classified elsewhere: Secondary | ICD-10-CM | POA: Diagnosis present

## 2018-10-16 DIAGNOSIS — R911 Solitary pulmonary nodule: Secondary | ICD-10-CM | POA: Diagnosis present

## 2018-10-16 DIAGNOSIS — Z8673 Personal history of transient ischemic attack (TIA), and cerebral infarction without residual deficits: Secondary | ICD-10-CM

## 2018-10-16 DIAGNOSIS — M79606 Pain in leg, unspecified: Secondary | ICD-10-CM | POA: Diagnosis not present

## 2018-10-16 DIAGNOSIS — G8929 Other chronic pain: Secondary | ICD-10-CM | POA: Diagnosis not present

## 2018-10-16 DIAGNOSIS — E1142 Type 2 diabetes mellitus with diabetic polyneuropathy: Secondary | ICD-10-CM | POA: Diagnosis present

## 2018-10-16 DIAGNOSIS — Z7982 Long term (current) use of aspirin: Secondary | ICD-10-CM

## 2018-10-16 DIAGNOSIS — I358 Other nonrheumatic aortic valve disorders: Secondary | ICD-10-CM | POA: Diagnosis not present

## 2018-10-16 DIAGNOSIS — K047 Periapical abscess without sinus: Secondary | ICD-10-CM | POA: Diagnosis not present

## 2018-10-16 DIAGNOSIS — E119 Type 2 diabetes mellitus without complications: Secondary | ICD-10-CM | POA: Diagnosis not present

## 2018-10-16 DIAGNOSIS — N183 Chronic kidney disease, stage 3 (moderate): Secondary | ICD-10-CM | POA: Diagnosis present

## 2018-10-16 DIAGNOSIS — R509 Fever, unspecified: Secondary | ICD-10-CM | POA: Diagnosis not present

## 2018-10-16 DIAGNOSIS — I129 Hypertensive chronic kidney disease with stage 1 through stage 4 chronic kidney disease, or unspecified chronic kidney disease: Secondary | ICD-10-CM | POA: Diagnosis not present

## 2018-10-16 DIAGNOSIS — D72829 Elevated white blood cell count, unspecified: Secondary | ICD-10-CM | POA: Diagnosis not present

## 2018-10-16 DIAGNOSIS — D7589 Other specified diseases of blood and blood-forming organs: Secondary | ICD-10-CM | POA: Diagnosis not present

## 2018-10-16 DIAGNOSIS — R4189 Other symptoms and signs involving cognitive functions and awareness: Secondary | ICD-10-CM | POA: Diagnosis not present

## 2018-10-16 DIAGNOSIS — M6281 Muscle weakness (generalized): Secondary | ICD-10-CM | POA: Diagnosis not present

## 2018-10-16 DIAGNOSIS — I330 Acute and subacute infective endocarditis: Secondary | ICD-10-CM | POA: Diagnosis not present

## 2018-10-16 DIAGNOSIS — R32 Unspecified urinary incontinence: Secondary | ICD-10-CM | POA: Diagnosis not present

## 2018-10-16 DIAGNOSIS — R748 Abnormal levels of other serum enzymes: Secondary | ICD-10-CM | POA: Diagnosis not present

## 2018-10-16 DIAGNOSIS — M899 Disorder of bone, unspecified: Secondary | ICD-10-CM | POA: Diagnosis not present

## 2018-10-16 DIAGNOSIS — Z9981 Dependence on supplemental oxygen: Secondary | ICD-10-CM | POA: Diagnosis not present

## 2018-10-16 DIAGNOSIS — N179 Acute kidney failure, unspecified: Secondary | ICD-10-CM | POA: Diagnosis not present

## 2018-10-16 DIAGNOSIS — R937 Abnormal findings on diagnostic imaging of other parts of musculoskeletal system: Secondary | ICD-10-CM

## 2018-10-16 DIAGNOSIS — R05 Cough: Secondary | ICD-10-CM | POA: Diagnosis not present

## 2018-10-16 DIAGNOSIS — M5126 Other intervertebral disc displacement, lumbar region: Secondary | ICD-10-CM | POA: Diagnosis not present

## 2018-10-16 LAB — COMPREHENSIVE METABOLIC PANEL
ALK PHOS: 237 U/L — AB (ref 38–126)
ALT: 39 U/L (ref 0–44)
AST: 40 U/L (ref 15–41)
Albumin: 3 g/dL — ABNORMAL LOW (ref 3.5–5.0)
Anion gap: 13 (ref 5–15)
BILIRUBIN TOTAL: 1.1 mg/dL (ref 0.3–1.2)
BUN: 11 mg/dL (ref 8–23)
CALCIUM: 8.3 mg/dL — AB (ref 8.9–10.3)
CO2: 23 mmol/L (ref 22–32)
CREATININE: 1.13 mg/dL (ref 0.61–1.24)
Chloride: 101 mmol/L (ref 98–111)
GFR calc Af Amer: >60 mL/min (ref >60)
GFR calc non Af Amer: >60 mL/min (ref >60)
GLUCOSE: 193 mg/dL — AB (ref 70–99)
Potassium: 3.3 mmol/L — ABNORMAL LOW (ref 3.5–5.1)
SODIUM: 137 mmol/L (ref 135–145)
TOTAL PROTEIN: 6.7 g/dL (ref 6.5–8.1)

## 2018-10-16 LAB — CBC
HEMATOCRIT: 29.4 % — AB (ref 39.0–52.0)
HEMOGLOBIN: 9.2 g/dL — AB (ref 13.0–17.0)
MCH: 29.9 pg (ref 26.0–34.0)
MCHC: 31.3 g/dL (ref 30.0–36.0)
MCV: 95.5 fL (ref 80.0–100.0)
NRBC: 0.1 % (ref 0.0–0.2)
PLATELETS: 189 10*3/uL (ref 150–400)
RBC: 3.08 MIL/uL — AB (ref 4.22–5.81)
RDW: 15.3 % (ref 11.5–15.5)
WBC: 23.9 10*3/uL — ABNORMAL HIGH (ref 4.0–10.5)

## 2018-10-16 LAB — CBC WITH DIFFERENTIAL/PLATELET
ABS IMMATURE GRANULOCYTES: 7.14 10*3/uL — AB (ref 0.00–0.07)
Basophils Absolute: 0.2 10*3/uL — ABNORMAL HIGH (ref 0.0–0.1)
Basophils Relative: 1 %
EOS PCT: 0 %
Eosinophils Absolute: 0 10*3/uL (ref 0.0–0.5)
HEMATOCRIT: 34.1 % — AB (ref 39.0–52.0)
HEMOGLOBIN: 10.7 g/dL — AB (ref 13.0–17.0)
Immature Granulocytes: 23 %
LYMPHS ABS: 1.9 10*3/uL (ref 0.7–4.0)
Lymphocytes Relative: 6 %
MCH: 29.9 pg (ref 26.0–34.0)
MCHC: 31.4 g/dL (ref 30.0–36.0)
MCV: 95.3 fL (ref 80.0–100.0)
MONO ABS: 3.6 10*3/uL — AB (ref 0.1–1.0)
MONOS PCT: 12 %
Neutro Abs: 18.2 10*3/uL — ABNORMAL HIGH (ref 1.7–7.7)
Neutrophils Relative %: 58 %
Platelets: 259 10*3/uL (ref 150–400)
RBC: 3.58 MIL/uL — ABNORMAL LOW (ref 4.22–5.81)
RDW: 15.1 % (ref 11.5–15.5)
Smear Review: NORMAL
WBC: 31.1 10*3/uL — ABNORMAL HIGH (ref 4.0–10.5)
nRBC: 0.1 % (ref 0.0–0.2)

## 2018-10-16 LAB — GLUCOSE, CAPILLARY: GLUCOSE-CAPILLARY: 183 mg/dL — AB (ref 70–99)

## 2018-10-16 LAB — URINALYSIS, COMPLETE (UACMP) WITH MICROSCOPIC
Bacteria, UA: NONE SEEN
Bilirubin Urine: NEGATIVE
HGB URINE DIPSTICK: NEGATIVE
Ketones, ur: 20 mg/dL — AB
NITRITE: NEGATIVE
PH: 6 (ref 5.0–8.0)
Protein, ur: 100 mg/dL — AB
SPECIFIC GRAVITY, URINE: 1.02 (ref 1.005–1.030)
WBC, UA: >50 WBC/hpf — ABNORMAL HIGH (ref 0–5)

## 2018-10-16 LAB — LACTIC ACID, PLASMA
LACTIC ACID, VENOUS: 1.1 mmol/L (ref 0.5–1.9)
Lactic Acid, Venous: 1.4 mmol/L (ref 0.5–1.9)

## 2018-10-16 LAB — CREATININE, SERUM
Creatinine, Ser: 1.14 mg/dL (ref 0.61–1.24)
GFR calc non Af Amer: >60 mL/min (ref >60)

## 2018-10-16 LAB — TROPONIN I: Troponin I: <0.03 ng/mL (ref <0.03)

## 2018-10-16 LAB — CK: Total CK: 15 U/L — ABNORMAL LOW (ref 49–397)

## 2018-10-16 LAB — HEMOGLOBIN A1C
Hgb A1c MFr Bld: 6.3 % — ABNORMAL HIGH (ref 4.8–5.6)
Mean Plasma Glucose: 134.11 mg/dL

## 2018-10-16 LAB — PATHOLOGIST SMEAR REVIEW

## 2018-10-16 MED ORDER — SODIUM CHLORIDE 0.9% FLUSH
3.0000 mL | Freq: Two times a day (BID) | INTRAVENOUS | Status: DC
  Administered 2018-10-17 – 2018-10-25 (×13): 3 mL via INTRAVENOUS

## 2018-10-16 MED ORDER — DOCUSATE SODIUM 100 MG PO CAPS
100.0000 mg | ORAL_CAPSULE | Freq: Two times a day (BID) | ORAL | Status: DC
  Administered 2018-10-17 – 2018-10-25 (×17): 100 mg via ORAL
  Filled 2018-10-16 (×18): qty 1

## 2018-10-16 MED ORDER — VANCOMYCIN HCL IN DEXTROSE 1-5 GM/200ML-% IV SOLN
1000.0000 mg | Freq: Once | INTRAVENOUS | Status: AC
  Administered 2018-10-16: 1000 mg via INTRAVENOUS
  Filled 2018-10-16: qty 200

## 2018-10-16 MED ORDER — ONDANSETRON HCL 4 MG/2ML IJ SOLN: 4.0000 mg | Freq: Four times a day (QID) | INTRAMUSCULAR | Status: DC | PRN

## 2018-10-16 MED ORDER — BISACODYL 5 MG PO TBEC
5.0000 mg | DELAYED_RELEASE_TABLET | Freq: Every day | ORAL | Status: DC | PRN
  Administered 2018-10-19: 5 mg via ORAL
  Filled 2018-10-16: qty 1

## 2018-10-16 MED ORDER — INSULIN ASPART 100 UNIT/ML ~~LOC~~ SOLN: 0.0000 [IU] | Freq: Every day | SUBCUTANEOUS | Status: DC

## 2018-10-16 MED ORDER — ASPIRIN EC 81 MG PO TBEC
81.0000 mg | DELAYED_RELEASE_TABLET | Freq: Every day | ORAL | Status: DC
  Administered 2018-10-16 – 2018-10-18 (×3): 81 mg via ORAL
  Filled 2018-10-16 (×3): qty 1

## 2018-10-16 MED ORDER — SODIUM CHLORIDE 0.9 % IV SOLN: 2.0000 g | Freq: Once | INTRAVENOUS | Status: DC

## 2018-10-16 MED ORDER — INSULIN GLARGINE 100 UNIT/ML ~~LOC~~ SOLN
20.0000 [IU] | Freq: Every day | SUBCUTANEOUS | Status: DC
  Administered 2018-10-16 – 2018-10-25 (×10): 20 [IU] via SUBCUTANEOUS
  Filled 2018-10-16 (×10): qty 0.2

## 2018-10-16 MED ORDER — HYDRALAZINE HCL 20 MG/ML IJ SOLN
10.0000 mg | INTRAMUSCULAR | Status: DC | PRN
  Administered 2018-10-21: 10 mg via INTRAVENOUS
  Filled 2018-10-16: qty 1

## 2018-10-16 MED ORDER — CANAGLIFLOZIN 100 MG PO TABS: 100.0000 mg | ORAL_TABLET | Freq: Every day | ORAL | Status: DC

## 2018-10-16 MED ORDER — POLYETHYLENE GLYCOL 3350 17 G PO PACK: 17.0000 g | PACK | Freq: Every day | ORAL | Status: DC | PRN

## 2018-10-16 MED ORDER — ACETAMINOPHEN 325 MG PO TABS
650.0000 mg | ORAL_TABLET | Freq: Four times a day (QID) | ORAL | Status: DC | PRN
  Administered 2018-10-17 – 2018-10-23 (×4): 650 mg via ORAL
  Filled 2018-10-16 (×4): qty 2

## 2018-10-16 MED ORDER — SODIUM CHLORIDE 0.9 % IV SOLN
2.0000 g | Freq: Once | INTRAVENOUS | Status: AC
  Administered 2018-10-16: 2 g via INTRAVENOUS
  Filled 2018-10-16: qty 2

## 2018-10-16 MED ORDER — DILTIAZEM HCL 30 MG PO TABS
30.0000 mg | ORAL_TABLET | Freq: Three times a day (TID) | ORAL | Status: DC
  Administered 2018-10-16 – 2018-10-25 (×28): 30 mg via ORAL
  Filled 2018-10-16 (×27): qty 1

## 2018-10-16 MED ORDER — SODIUM CHLORIDE 0.9 % IV BOLUS (SEPSIS)
1000.0000 mL | Freq: Once | INTRAVENOUS | Status: AC
  Administered 2018-10-16: 1000 mL via INTRAVENOUS

## 2018-10-16 MED ORDER — SODIUM CHLORIDE 0.9 % IV BOLUS
1000.0000 mL | Freq: Once | INTRAVENOUS | Status: AC
  Administered 2018-10-16: 1000 mL via INTRAVENOUS

## 2018-10-16 MED ORDER — GLUCERNA SHAKE PO LIQD
237.0000 mL | Freq: Three times a day (TID) | ORAL | Status: DC
  Administered 2018-10-17 – 2018-10-25 (×12): 237 mL via ORAL

## 2018-10-16 MED ORDER — SODIUM CHLORIDE 0.9 % IV BOLUS (SEPSIS): 1000.0000 mL | Freq: Once | INTRAVENOUS | Status: AC

## 2018-10-16 MED ORDER — VANCOMYCIN HCL IN DEXTROSE 1-5 GM/200ML-% IV SOLN
1000.0000 mg | Freq: Two times a day (BID) | INTRAVENOUS | Status: DC
  Administered 2018-10-17 – 2018-10-19 (×5): 1000 mg via INTRAVENOUS
  Filled 2018-10-16 (×7): qty 200

## 2018-10-16 MED ORDER — INSULIN ASPART 100 UNIT/ML ~~LOC~~ SOLN
0.0000 [IU] | Freq: Three times a day (TID) | SUBCUTANEOUS | Status: DC
  Administered 2018-10-17 – 2018-10-19 (×4): 2 [IU] via SUBCUTANEOUS
  Administered 2018-10-19: 3 [IU] via SUBCUTANEOUS
  Administered 2018-10-20: 2 [IU] via SUBCUTANEOUS
  Administered 2018-10-21: 3 [IU] via SUBCUTANEOUS
  Administered 2018-10-21: 2 [IU] via SUBCUTANEOUS
  Administered 2018-10-22 (×3): 3 [IU] via SUBCUTANEOUS
  Administered 2018-10-23 – 2018-10-24 (×4): 2 [IU] via SUBCUTANEOUS
  Administered 2018-10-25: 5 [IU] via SUBCUTANEOUS
  Administered 2018-10-25: 2 [IU] via SUBCUTANEOUS
  Filled 2018-10-16 (×16): qty 1

## 2018-10-16 MED ORDER — SODIUM CHLORIDE 0.9 % IV SOLN: INTRAVENOUS | Status: DC

## 2018-10-16 MED ORDER — FLEET ENEMA 7-19 GM/118ML RE ENEM: 1.0000 | ENEMA | Freq: Once | RECTAL | Status: DC | PRN

## 2018-10-16 MED ORDER — VANCOMYCIN HCL IN DEXTROSE 1-5 GM/200ML-% IV SOLN: 1000.0000 mg | Freq: Once | INTRAVENOUS | Status: DC

## 2018-10-16 MED ORDER — SODIUM CHLORIDE 0.9 % IV BOLUS (SEPSIS)
250.0000 mL | Freq: Once | INTRAVENOUS | Status: AC
  Administered 2018-10-16: 250 mL via INTRAVENOUS

## 2018-10-16 MED ORDER — SODIUM CHLORIDE 0.9 % IV SOLN
250.0000 mL | INTRAVENOUS | Status: DC | PRN
  Administered 2018-10-18 – 2018-10-19 (×3): 250 mL via INTRAVENOUS
  Administered 2018-10-21: 10 mL/h via INTRAVENOUS
  Administered 2018-10-23: 250 mL via INTRAVENOUS

## 2018-10-16 MED ORDER — ONDANSETRON HCL 4 MG PO TABS: 4.0000 mg | ORAL_TABLET | Freq: Four times a day (QID) | ORAL | Status: DC | PRN

## 2018-10-16 MED ORDER — METRONIDAZOLE IN NACL 5-0.79 MG/ML-% IV SOLN
500.0000 mg | Freq: Three times a day (TID) | INTRAVENOUS | Status: DC
  Administered 2018-10-16 – 2018-10-19 (×9): 500 mg via INTRAVENOUS
  Filled 2018-10-16 (×11): qty 100

## 2018-10-16 MED ORDER — METRONIDAZOLE IN NACL 5-0.79 MG/ML-% IV SOLN
500.0000 mg | Freq: Three times a day (TID) | INTRAVENOUS | Status: DC
  Filled 2018-10-16 (×2): qty 100

## 2018-10-16 MED ORDER — SIMVASTATIN 5 MG PO TABS
5.0000 mg | ORAL_TABLET | Freq: Every day | ORAL | Status: DC
  Administered 2018-10-16: 5 mg via ORAL
  Filled 2018-10-16 (×2): qty 1

## 2018-10-16 MED ORDER — POTASSIUM CHLORIDE 20 MEQ PO PACK
40.0000 meq | PACK | Freq: Once | ORAL | Status: AC
  Administered 2018-10-16: 40 meq via ORAL
  Filled 2018-10-16: qty 2

## 2018-10-16 MED ORDER — TRAZODONE HCL 50 MG PO TABS
50.0000 mg | ORAL_TABLET | Freq: Every evening | ORAL | Status: DC | PRN
  Administered 2018-10-19 – 2018-10-24 (×6): 50 mg via ORAL
  Filled 2018-10-16 (×7): qty 1

## 2018-10-16 MED ORDER — ACETAMINOPHEN 650 MG RE SUPP
650.0000 mg | Freq: Four times a day (QID) | RECTAL | Status: DC | PRN
  Filled 2018-10-16: qty 1

## 2018-10-16 MED ORDER — SODIUM CHLORIDE 0.9 % IV SOLN
2.0000 g | Freq: Three times a day (TID) | INTRAVENOUS | Status: DC
  Administered 2018-10-17 – 2018-10-18 (×5): 2 g via INTRAVENOUS
  Filled 2018-10-16 (×7): qty 2

## 2018-10-16 MED ORDER — ENOXAPARIN SODIUM 40 MG/0.4ML ~~LOC~~ SOLN
40.0000 mg | SUBCUTANEOUS | Status: DC
  Administered 2018-10-16 – 2018-10-18 (×3): 40 mg via SUBCUTANEOUS
  Filled 2018-10-16 (×3): qty 0.4

## 2018-10-16 MED ORDER — LISINOPRIL 20 MG PO TABS
20.0000 mg | ORAL_TABLET | Freq: Every day | ORAL | Status: DC
  Administered 2018-10-16 – 2018-10-25 (×10): 20 mg via ORAL
  Filled 2018-10-16 (×10): qty 1

## 2018-10-16 MED ORDER — SODIUM CHLORIDE 0.9% FLUSH
3.0000 mL | INTRAVENOUS | Status: DC | PRN
  Administered 2018-10-25: 3 mL via INTRAVENOUS
  Filled 2018-10-16: qty 3

## 2018-10-16 MED ORDER — HYDROCHLOROTHIAZIDE 25 MG PO TABS
12.5000 mg | ORAL_TABLET | Freq: Every day | ORAL | Status: DC
  Administered 2018-10-16 – 2018-10-17 (×2): 12.5 mg via ORAL
  Filled 2018-10-16 (×2): qty 1

## 2018-10-16 MED ORDER — HYDROCODONE-ACETAMINOPHEN 5-325 MG PO TABS
1.0000 | ORAL_TABLET | ORAL | Status: DC | PRN
  Administered 2018-10-18 – 2018-10-24 (×8): 2 via ORAL
  Administered 2018-10-25: 1 via ORAL
  Filled 2018-10-16 (×8): qty 2
  Filled 2018-10-16: qty 1

## 2018-10-16 NOTE — Telephone Encounter (Signed)
PA [facility] for leg pain. Patient is tachycardic & hypotensive. I spoke with Kerin Salen, LPN to see if patient could be worked in earlier than tomorrow, but she also agreed with PA patient should go to ER since no available appt today. Patient is still being advised to come in for hospital f/u tomorrow if not admitted to ER.

## 2018-10-16 NOTE — Progress Notes (Addendum)
Pharmacy Antibiotic Note  Richard Gordon is a 67 y.o. male admitted on 10/16/2018 with sepsis.  Pharmacy has been consulted for vancomycin, and cefepime dosing.  Ke: 0.058 hr-1, t1/2: 11.95, Vd: 52.36   Plan: Patient will receive vancomycin 1000 mg x 1 in the ED followed by a maintenance dose of 1000 mg q12H with 6 hour stacked dosing. Predictive trough of ~16-18 mcg/ml. Plan to order trough level prior to the 5th dose. Goal vancomycin trough level is 15-20 mcg/mL.   Will start cefepime 2 g q8H.   Height: 5' 10" (177.8 cm) Weight: 165 lb (74.8 kg) IBW/kg (Calculated) : 73  Temp (24hrs), Avg:98.1 F (36.7 C), Min:98.1 F (36.7 C), Max:98.1 F (36.7 C)  Recent Labs  Lab 10/10/18 0323 10/10/18 2309  10/11/18 0713 10/12/18 0516 10/12/18 2011 10/13/18 0446 10/14/18 0801 10/16/18 1236 10/16/18 1523  WBC 30.5*  --   --  31.3* 24.3*  --  16.0* 16.7* 31.1*  --   CREATININE 1.35*  --   --  1.33* 1.02  --   --  0.72 1.13  --   LATICACIDVEN  --  0.9  --   --   --   --   --   --   --  1.4  VANCOTROUGH  --   --    < > 10*  --  19  --  20  --   --    < > = values in this interval not displayed.    Estimated Creatinine Clearance: 64.6 mL/min (by C-G formula based on SCr of 1.13 mg/dL).    No Known Allergies  Antimicrobials this admission: 11/12 cefepime >>  11/12 vancomycin >>  11/12 metronidazole >> 11/12  Dose adjustments this admission: None  Microbiology results: 11/12 BCx 2/2: Pending  Thank you for allowing pharmacy to be a part of this patient's care.  Oswald Hillock 10/16/2018 4:06 PM

## 2018-10-16 NOTE — H&P (Signed)
[place] [facility] [place] NAME: Richard Gordon    MR#:  [id number]  DATE OF BIRTH:  02-Feb-1951  DATE OF ADMISSION:  10/16/2018  PRIMARY CARE PHYSICIAN: Leone Haven, MD   REQUESTING/REFERRING PHYSICIAN:   CHIEF COMPLAINT:   Chief Complaint  Patient presents with  . Muscle Pain    HISTORY OF PRESENT ILLNESS: Richard Gordon  is a 67 y.o. male with a known history per below recently discharged from the hospital 2 days ago per his request for sepsis despite concern for endocarditis-patient did not complete work-up at that time, now returns with vague complaints of stiff legs difficulty with walking, generalized weakness, fatigue, insomnia, in the emergency room patient was found to have potassium 3.3, white count of 31,000, patient denies any current pain, patient now be admitted for acute probable endocarditis.  PAST MEDICAL HISTORY:   Past Medical History:  Diagnosis Date  . Chronic kidney disease (CKD), stage III (moderate) ([place]) 11/03/2015  . Diabetes mellitus without complication ([place])   . Hyperlipidemia   . Hypertension   . Stroke [facility])    per pt 2 CVA in 2000,and 1998 (  R ,then L side effected seperately per pt )    PAST SURGICAL HISTORY:  Past Surgical History:  Procedure Laterality Date  . CATARACT EXTRACTION    . TONSILLECTOMY      SOCIAL HISTORY:  Social History   Tobacco Use  . Smoking status: Former Smoker    Packs/day: 0.50    Years: 25.00    Pack years: 12.50    Types: Cigarettes    Last attempt to quit: 04/04/1999    Years since quitting: 19.5  . Smokeless tobacco: Former Systems developer    Types: Snuff  Substance Use Topics  . Alcohol use: No    Alcohol/week: 0.0 standard drinks    FAMILY HISTORY:  Family History  Problem Relation Age of Onset  . Hypertension Father   . Diabetes Father   . Brain cancer Mother        per pt he was 26 and thinks this was cancer   . Coronary artery disease Sister        scarlet fever  as child affected heart per pt    DRUG ALLERGIES: No Known Allergies  REVIEW OF SYSTEMS:   CONSTITUTIONAL: + Subjective fever, fatigue, weakness.  EYES: No blurred or double vision.  EARS, NOSE, AND THROAT: No tinnitus or ear pain.  RESPIRATORY: No cough, shortness of breath, wheezing or hemoptysis.  CARDIOVASCULAR: No chest pain, orthopnea, edema.  GASTROINTESTINAL: No nausea, vomiting, diarrhea or abdominal pain.  GENITOURINARY: No dysuria, hematuria.  ENDOCRINE: No polyuria, nocturia,  HEMATOLOGY: No anemia, easy bruising or bleeding SKIN: No rash or lesion. MUSCULOSKELETAL: Leg stiffness/cramping    NEUROLOGIC: No tingling, numbness, weakness.  PSYCHIATRY: No anxiety or depression.   MEDICATIONS AT HOME:  Prior to Admission medications   Medication Sig Start Date End Date Taking? Authorizing Provider  aspirin EC 81 MG tablet Take 1 tablet (81 mg total) by mouth daily. 07/04/17  Yes Cook, Jayce G, DO  blood glucose meter kit and supplies KIT Accu chek. Check twice daily. E11.42. 09/13/17  Yes Leone Haven, MD  cefdinir (OMNICEF) 300 MG capsule Take 1 capsule (300 mg total) by mouth 2 (two) times daily. 10/14/18  Yes Jonnie Kubly, Avel Peace, MD  diltiazem (CARDIZEM) 30 MG tablet Take 1 tablet (30 mg total) by mouth every 8 (eight) hours. 10/14/18  Yes  Elizzie Westergard, Avel Peace, MD  empagliflozin (JARDIANCE) 10 MG TABS tablet Take 10 mg by mouth daily. 07/30/18  Yes Leone Haven, MD  feeding supplement, GLUCERNA SHAKE, (GLUCERNA SHAKE) LIQD Take 237 mLs by mouth 3 (three) times daily between meals. 10/14/18  Yes Khi Mcmillen, Holly Bodily D, MD  glucose blood (ONETOUCH VERIO) test strip Check blood sugar twice daily Dx: E11.42 07/20/18  Yes Leone Haven, MD  hydrochlorothiazide (HYDRODIURIL) 12.5 MG tablet TAKE 1 TABLET DAILY Patient taking differently: Take 12.5 mg by mouth daily.  07/20/18  Yes Leone Haven, MD  Insulin Glargine (BASAGLAR KWIKPEN) 100 UNIT/ML SOPN Inject 0.42 mLs (42  Units total) into the skin at bedtime. 12/21/17  Yes Leone Haven, MD  Insulin Pen Needle 31G X 5 MM MISC 1 each by Does not apply route daily. 07/20/18  Yes Leone Haven, MD  Lancets (ACCU-CHEK SOFT TOUCH) lancets Check blood sugar twice daily. DX: E11.42.  Quantity Sufficient 90 day supply 03/27/17  Yes Cook, Jayce G, DO  lisinopril (PRINIVIL,ZESTRIL) 20 MG tablet TAKE 1 TABLET DAILY Patient taking differently: Take 20 mg by mouth daily.  10/08/18  Yes Leone Haven, MD  metFORMIN (GLUCOPHAGE) 1000 MG tablet Take 1 tablet (1,000 mg total) by mouth 2 (two) times daily. 02/27/18  Yes Leone Haven, MD  simvastatin (ZOCOR) 5 MG tablet Take 1 tablet (5 mg total) by mouth daily at 6 PM. 12/19/17  Yes Leone Haven, MD  metroNIDAZOLE (FLAGYL) 500 MG tablet Take 1 tablet (500 mg total) by mouth 3 (three) times daily. 10/14/18   Amandalee Lacap, Holly Bodily D, MD      PHYSICAL EXAMINATION:   VITAL SIGNS: Blood pressure 112/79, pulse (!) 123, temperature 98.1 F (36.7 C), temperature source Oral, resp. rate 18, height 5' 10" (1.778 m), weight 74.8 kg, SpO2 100 %.  GENERAL:  66 y.o.-year-old patient lying in the bed with no acute distress.  Frail-appearing EYES: Pupils equal, round, reactive to light and accommodation. No scleral icterus. Extraocular muscles intact.  HEENT: Head atraumatic, normocephalic. Oropharynx and nasopharynx clear.  NECK:  Supple, no jugular venous distention. No thyroid enlargement, no tenderness.  LUNGS: Normal breath sounds bilaterally, no wheezing, rales,rhonchi or crepitation. No use of accessory muscles of respiration.  CARDIOVASCULAR: S1, S2 normal. No murmurs, rubs, or gallops.  ABDOMEN: Soft, nontender, nondistended. Bowel sounds present. No organomegaly or mass.  EXTREMITIES: No pedal edema, cyanosis, or clubbing.  NEUROLOGIC: Cranial nerves II through XII are intact. Muscle strength 5/5 in all extremities. Sensation intact. Gait not checked.  PSYCHIATRIC:  The patient is alert and oriented x 3.  SKIN: No obvious rash, lesion, or ulcer.   LABORATORY PANEL:   CBC Recent Labs  Lab 10/11/18 0713 10/12/18 0516 10/13/18 0446 10/14/18 0801 10/16/18 1236  WBC 31.3* 24.3* 16.0* 16.7* 31.1*  HGB 8.7* 8.6* 8.8* 9.6* 10.7*  HCT 27.2* 27.4* 28.1* 30.1* 34.1*  PLT 169 190 197 211 259  MCV 95.4 94.8 94.3 94.4 95.3  MCH 30.5 29.8 29.5 30.1 29.9  MCHC 32.0 31.4 31.3 31.9 31.4  RDW 14.9 14.9 15.1 14.7 15.1  LYMPHSABS  --   --   --   --  1.9  MONOABS  --   --   --   --  3.6*  EOSABS  --   --   --   --  0.0  BASOSABS  --   --   --   --  0.2*   ------------------------------------------------------------------------------------------------------------------  Chemistries  Recent  Labs  Lab 10/10/18 0323 10/11/18 0713 10/12/18 0516 10/14/18 0801 10/16/18 1236  NA 135 135 136 137 137  K 3.9 3.7 3.5 3.2* 3.3*  CL 101 105 109 106 101  CO2 24 20* 21* 23 23  GLUCOSE 131* 85 114* 109* 193*  BUN 32* 31* 31* 16 11  CREATININE 1.35* 1.33* 1.02 0.72 1.13  CALCIUM 8.4* 7.5* 7.5* 7.5* 8.3*  AST  --   --  72*  --  40  ALT  --   --  61*  --  39  ALKPHOS  --   --  192*  --  237*  BILITOT  --   --  1.0  --  1.1   ------------------------------------------------------------------------------------------------------------------ estimated creatinine clearance is 64.6 mL/min (by C-G formula based on SCr of 1.13 mg/dL). ------------------------------------------------------------------------------------------------------------------ No results for input(s): TSH, T4TOTAL, T3FREE, THYROIDAB in the last 72 hours.  Invalid input(s): FREET3   Coagulation profile Recent Labs  Lab 10/09/18 2308  INR 1.26   ------------------------------------------------------------------------------------------------------------------- No results for input(s): DDIMER in the last 72  hours. -------------------------------------------------------------------------------------------------------------------  Cardiac Enzymes Recent Labs  Lab 10/16/18 1236  TROPONINI <0.03   ------------------------------------------------------------------------------------------------------------------ Invalid input(s): POCBNP  ---------------------------------------------------------------------------------------------------------------  Urinalysis    Component Value Date/Time   COLORURINE YELLOW (A) 10/16/2018 1523   APPEARANCEUR HAZY (A) 10/16/2018 1523   APPEARANCEUR Clear 07/10/2013 1925   LABSPEC 1.020 10/16/2018 1523   LABSPEC 1.021 07/10/2013 1925   PHURINE 6.0 10/16/2018 1523   GLUCOSEU >=500 (A) 10/16/2018 1523   GLUCOSEU >=500 07/10/2013 1925   HGBUR NEGATIVE 10/16/2018 1523   BILIRUBINUR NEGATIVE 10/16/2018 1523   BILIRUBINUR negative 03/03/2016 1428   BILIRUBINUR Negative 07/10/2013 1925   KETONESUR 20 (A) 10/16/2018 1523   PROTEINUR 100 (A) 10/16/2018 1523   UROBILINOGEN 0.2 03/03/2016 1428   NITRITE NEGATIVE 10/16/2018 1523   LEUKOCYTESUR TRACE (A) 10/16/2018 1523   LEUKOCYTESUR Negative 07/10/2013 1925     RADIOLOGY: No results found.  EKG: Orders placed or performed during the hospital encounter of 10/09/18  . EKG 12-Lead  . EKG 12-Lead  . EKG    IMPRESSION AND PLAN: *Acute suspected bacterial endocarditis with sepsis Noted work-up last admission for multiple dental abscesses on imaging  Admit to regular nursing for bed on our sepsis protocol, empiric cefepime, vancomycin, Flagyl, TTE  last admission noted for diastolic dysfunction stage I, consult cardiology for TEE, infectious disease for expert opinion, and follow-up on cultures   *Acute hypokalemia Replete with p.o. potassium and check magnesium level in the morning  *CKDstage 3 Stable  Avoid nephrotoxic agents   *Chronic benign essential hypertension Stable Continue home regiment    *Chronic diabetes mellitus type 2  Hold metformin, sliding scale insulin with Accu-Cheks per routine   All the records are reviewed and case discussed with ED provider. Management plans discussed with the patient, family and they are in agreement.  CODE STATUS:full Code Status History    Date Active Date Inactive Code Status Order ID Comments User Context   10/09/2018 2301 10/14/2018 1535 Full Code [id number]  Demetrios Loll, MD Inpatient       TOTAL TIME TAKING CARE OF THIS PATIENT: 40 minutes.    Avel Peace Zarion Oliff M.D on 10/16/2018   Between 7am to 6pm - Pager - [phone]  After 6pm go to [email or web address] - password [email or web address] [place] [facility]  Office  [phone]  CC: Primary care physician; Leone Haven, MD   Note: This dictation was prepared with Dragon dictation along  with smaller phrase technology. Any transcriptional errors that result from this process are unintentional.

## 2018-10-16 NOTE — Telephone Encounter (Signed)
Please advise?

## 2018-10-16 NOTE — Progress Notes (Signed)
CODE SEPSIS - PHARMACY COMMUNICATION  **Broad Spectrum Antibiotics should be administered within 1 hour of Sepsis diagnosis**  Time Code Sepsis Called/Page Received: 11/12 _0   Antibiotics Ordered: Cefepime 2 g x 1, Flagyl 500 mg x 1, Vancomycin 1g x 1  Time of 1st antibiotic administration: 11/12 _1   Additional action taken by pharmacy: None   Oswald Hillock ,PharmD Clinical Pharmacist  10/16/2018  4:02 PM

## 2018-10-16 NOTE — Telephone Encounter (Signed)
Sent to Dr. Caryl Bis please advise

## 2018-10-16 NOTE — ED Triage Notes (Signed)
Patient was in wheelchair in lobby.  Says he is uncomfortable and sore.  Assisted out of wheelchiar and into a lobby chair which gave some relief.

## 2018-10-16 NOTE — ED Notes (Signed)
[place]bow sent to lab at this time.

## 2018-10-16 NOTE — Progress Notes (Signed)
Family Meeting Note  Advance Directive:yes  Today a meeting took place with the Patient.  Patient is able to participate   The following clinical team members were present during this meeting:MD  The following were discussed:Patient's diagnosis: Probable endocarditis, Patient's progosis: Unable to determine and Goals for treatment: Full Code  Additional follow-up to be provided: prn  Time spent during discussion:20 minutes  Gorden Harms, MD

## 2018-10-16 NOTE — ED Provider Notes (Signed)
[facility] Emergency Department Provider Note ____________________________________________   First MD Initiated Contact with Patient 10/16/18 1430     (approximate)  I have reviewed the triage vital signs and the nursing notes.   HISTORY  Chief Complaint Muscle Pain    HPI KHIZAR FIORELLA is a 67 y.o. male with PMH as noted below who presents with body aches, persistent course over the last several days, associated with some generalized weakness.  He also states that he has been having trouble sleeping.  He denies chest pain or difficulty breathing, fevers, vomiting, or urinary symptoms.  The patient was recently admitted and being worked up for sepsis and possible endocarditis but left because he was tired of being in the hospital.  Past Medical History:  Diagnosis Date  . Chronic kidney disease (CKD), stage III (moderate) ([place]) 11/03/2015  . Diabetes mellitus without complication ([place])   . Hyperlipidemia   . Hypertension   . Stroke [facility])    per pt 2 CVA in 2000,and 1998 (  R ,then L side effected seperately per pt )    Patient Active Problem List   Diagnosis Date Noted  . Sepsis ([place]) 10/09/2018  . Decreased energy 07/09/2018  . Thrombocytopenia ([place]) 04/01/2018  . Syncope 03/09/2018  . AD (atopic dermatitis) 11/03/2015  . Chronic kidney disease (CKD), stage III (moderate) ([place]) 11/03/2015  . Personal history of stroke with residual effects 11/03/2015  . Hyperlipidemia associated with type 2 diabetes mellitus ([place]) 07/09/2015  . Hypertension 07/09/2015  . DM type 2 with diabetic peripheral neuropathy ([place]) 05/22/2015    Past Surgical History:  Procedure Laterality Date  . CATARACT EXTRACTION    . TONSILLECTOMY      Prior to Admission medications   Medication Sig Start Date End Date Taking? Authorizing Provider  aspirin EC 81 MG tablet Take 1 tablet (81 mg total) by mouth daily. 07/04/17  Yes Cook, Jayce G, DO  blood glucose meter kit and  supplies KIT Accu chek. Check twice daily. E11.42. 09/13/17  Yes Leone Haven, MD  cefdinir (OMNICEF) 300 MG capsule Take 1 capsule (300 mg total) by mouth 2 (two) times daily. 10/14/18  Yes Salary, Avel Peace, MD  diltiazem (CARDIZEM) 30 MG tablet Take 1 tablet (30 mg total) by mouth every 8 (eight) hours. 10/14/18  Yes Salary, Montell D, MD  empagliflozin (JARDIANCE) 10 MG TABS tablet Take 10 mg by mouth daily. 07/30/18  Yes Leone Haven, MD  feeding supplement, GLUCERNA SHAKE, (GLUCERNA SHAKE) LIQD Take 237 mLs by mouth 3 (three) times daily between meals. 10/14/18  Yes Salary, Holly Bodily D, MD  glucose blood (ONETOUCH VERIO) test strip Check blood sugar twice daily Dx: E11.42 07/20/18  Yes Leone Haven, MD  hydrochlorothiazide (HYDRODIURIL) 12.5 MG tablet TAKE 1 TABLET DAILY Patient taking differently: Take 12.5 mg by mouth daily.  07/20/18  Yes Leone Haven, MD  Insulin Glargine (BASAGLAR KWIKPEN) 100 UNIT/ML SOPN Inject 0.42 mLs (42 Units total) into the skin at bedtime. 12/21/17  Yes Leone Haven, MD  Insulin Pen Needle 31G X 5 MM MISC 1 each by Does not apply route daily. 07/20/18  Yes Leone Haven, MD  Lancets (ACCU-CHEK SOFT TOUCH) lancets Check blood sugar twice daily. DX: E11.42.  Quantity Sufficient 90 day supply 03/27/17  Yes Cook, Jayce G, DO  lisinopril (PRINIVIL,ZESTRIL) 20 MG tablet TAKE 1 TABLET DAILY Patient taking differently: Take 20 mg by mouth daily.  10/08/18  Yes Tommi Rumps  G, MD  metFORMIN (GLUCOPHAGE) 1000 MG tablet Take 1 tablet (1,000 mg total) by mouth 2 (two) times daily. 02/27/18  Yes Leone Haven, MD  simvastatin (ZOCOR) 5 MG tablet Take 1 tablet (5 mg total) by mouth daily at 6 PM. 12/19/17  Yes Leone Haven, MD  cyclobenzaprine (FLEXERIL) 5 MG tablet Take 1 tablet (5 mg total) by mouth 3 (three) times daily as needed for muscle spasms. Patient not taking: Reported on 10/09/2018 08/24/18   Burnard Hawthorne, FNP    metroNIDAZOLE (FLAGYL) 500 MG tablet Take 1 tablet (500 mg total) by mouth 3 (three) times daily. 10/14/18   Salary, Avel Peace, MD  ondansetron (ZOFRAN ODT) 4 MG disintegrating tablet Take 1 tablet (4 mg total) by mouth every 8 (eight) hours as needed for nausea or vomiting. Patient not taking: Reported on 10/09/2018 08/24/18   Burnard Hawthorne, FNP    Allergies Patient has no known allergies.  Family History  Problem Relation Age of Onset  . Hypertension Father   . Diabetes Father   . Brain cancer Mother        per pt he was 52 and thinks this was cancer   . Coronary artery disease Sister        scarlet fever as child affected heart per pt    Social History Social History   Tobacco Use  . Smoking status: Former Smoker    Packs/day: 0.50    Years: 25.00    Pack years: 12.50    Types: Cigarettes    Last attempt to quit: 04/04/1999    Years since quitting: 19.5  . Smokeless tobacco: Former Systems developer    Types: Snuff  Substance Use Topics  . Alcohol use: No    Alcohol/week: 0.0 standard drinks  . Drug use: No    Review of Systems  Constitutional: No fever. Eyes: No redness. ENT: No sore throat. Cardiovascular: Denies chest pain. Respiratory: Denies shortness of breath. Gastrointestinal: No vomiting.  Genitourinary: Negative for dysuria.  Musculoskeletal: Positive for bodyaches. Skin: Negative for rash. Neurological: Negative for headache.   ____________________________________________   PHYSICAL EXAM:  VITAL SIGNS: ED Triage Vitals  Enc Vitals Group     BP 10/16/18 1230 112/79     Pulse Rate 10/16/18 1230 (!) 123     Resp 10/16/18 1230 18     Temp 10/16/18 1230 98.1 F (36.7 C)     Temp Source 10/16/18 1230 Oral     SpO2 10/16/18 1230 100 %     Weight 10/16/18 1227 165 lb (74.8 kg)     Height 10/16/18 1227 5' 10" (1.778 m)     Head Circumference --      Peak Flow --      Pain Score 10/16/18 1227 0     Pain Loc --      Pain Edu? --      Excl. in [place]? --      Constitutional: Alert and oriented.  Relatively well appearing and in no acute distress. Eyes: Conjunctivae are normal.  Head: Atraumatic. Nose: No congestion/rhinnorhea. Mouth/Throat: Mucous membranes are dry.   Neck: Normal range of motion.  Cardiovascular: Tachycardic, regular rhythm. Grossly normal heart sounds.  Good peripheral circulation. Respiratory: Normal respiratory effort.  No retractions. Lungs CTAB. Gastrointestinal: Soft and nontender. No distention.  Genitourinary: No flank tenderness. Musculoskeletal: No lower extremity edema.  Extremities warm and well perfused.  Neurologic:  Normal speech and language. No gross focal neurologic deficits are appreciated.  Skin:  Skin is warm and dry. No rash noted. Psychiatric: Mood and affect are normal. Speech and behavior are normal.  ____________________________________________   LABS (all labs ordered are listed, but only abnormal results are displayed)  Labs Reviewed  COMPREHENSIVE METABOLIC PANEL - Abnormal; Notable for the following components:      Result Value   Potassium 3.3 (*)    Glucose, Bld 193 (*)    Calcium 8.3 (*)    Albumin 3.0 (*)    Alkaline Phosphatase 237 (*)    All other components within normal limits  CBC WITH DIFFERENTIAL/PLATELET - Abnormal; Notable for the following components:   WBC 31.1 (*)    RBC 3.58 (*)    Hemoglobin 10.7 (*)    HCT 34.1 (*)    Neutro Abs 18.2 (*)    Monocytes Absolute 3.6 (*)    Basophils Absolute 0.2 (*)    Abs Immature Granulocytes 7.14 (*)    All other components within normal limits  CK - Abnormal; Notable for the following components:   Total CK 15 (*)    All other components within normal limits  CULTURE, BLOOD (ROUTINE X 2)  CULTURE, BLOOD (ROUTINE X 2)  PATHOLOGIST SMEAR REVIEW  TROPONIN I  LACTIC ACID, PLASMA  LACTIC ACID, PLASMA  URINALYSIS, COMPLETE (UACMP) WITH MICROSCOPIC    ____________________________________________  EKG   ____________________________________________  RADIOLOGY  CXR: Pending  ____________________________________________   PROCEDURES  Procedure(s) performed: No  Procedures  Critical Care performed: No ____________________________________________   INITIAL IMPRESSION / ASSESSMENT AND PLAN / ED COURSE  Pertinent labs & imaging results that were available during my care of the patient were reviewed by me and considered in my medical decision making (see chart for details).  67 year old male with PMH as noted above presents with body aches and malaise over the last several days.  I reviewed the past medical records in Epic; the patient was just admitted and discharged 2 days ago after he presented with body aches and was being worked up for sepsis of unknown origin, with suspected endocarditis.  He decided he wanted to leave the hospital and the plan was to get a TEE as an outpatient.  However the patient states his been feeling worse and worse since he left.  On exam, he actually appears relatively well.  He is tachycardic but his other vital signs are normal.  The remainder of the exam is as described above.  Initial lab work-up is significant for WBC count of 31 which is increased from prior.  I am concerned for persistent sepsis, possibly endocarditis or other source.  Since his vital signs are abnormal and his WBC count is getting worse, we will obtain the rest of the sepsis work-up.  I have ordered broad-spectrum antibiotics and fluids.  I will readmit the patient.  The patient agrees with this plan.  Clinical Course as of Oct 17 1543  Tue Oct 16, 2018  1459    Sodium: 137 [SS]    Clinical Course User Index [SS] Arta Silence, MD   ----------------------------------------- 3:44 PM on 10/16/2018 -----------------------------------------  I signed the patient out to the hospitalist Dr.  Jerelyn Charles.  ____________________________________________   FINAL CLINICAL IMPRESSION(S) / ED DIAGNOSES  Final diagnoses:  Sepsis, due to unspecified organism, unspecified whether acute organ dysfunction present ([place])      NEW MEDICATIONS STARTED DURING THIS VISIT:  New Prescriptions   No medications on file     Note:  This document was prepared using  [employer]agon Armed forces training and education officer and may include unintentional dictation errors.    Arta Silence, MD 10/16/18 1544

## 2018-10-16 NOTE — Telephone Encounter (Signed)
Pt. Reports he has been having left leg pain since September. This past Friday he went to [place] [facility] because of the pain. He was not given "anything for the pain." Reports the whole leg hurts - the same pain he was seen for in September. States the flexeril helped that he was given, but he doesn't think he "has anymore." Has a Hospital follow up tomorrow with Dr. Caryl Bis. If he can not be worked in today, would like refill on Flexeril. Please advise pt.   Answer Assessment - Initial Assessment Questions 1. ONSET: "When did the pain start?"      Started Friday 2. LOCATION: "Where is the pain located?"      Whole leg hurts 3. PAIN: "How bad is the pain?"    (Scale 1-10; or mild, moderate, severe)   -  MILD (1-3): doesn't interfere with normal activities    -  MODERATE (4-7): interferes with normal activities (e.g., work or school) or awakens from sleep, limping    -  SEVERE (8-10): excruciating pain, unable to do any normal activities, unable to walk     8 4. WORK OR EXERCISE: "Has there been any recent work or exercise that involved this part of the body?"      No 5. CAUSE: "What do you think is causing the leg pain?"     Unsure 6. OTHER SYMPTOMS: "Do you have any other symptoms?" (e.g., chest pain, back pain, breathing difficulty, swelling, rash, fever, numbness, weakness)     No 7. PREGNANCY: "Is there any chance you are pregnant?" "When was your last menstrual period?"     n/a  Protocols used: LEG PAIN-A-AH

## 2018-10-16 NOTE — Telephone Encounter (Signed)
Patient is currently hospitalized for likely endocarditis.  We can see him in the office after discharge.

## 2018-10-16 NOTE — ED Triage Notes (Addendum)
Pt arrives to ED c/o "stiff all over" x 1.5 months. States pain in quad muscle in L and R leg. Placed in wheelchair upon arrival. A&O. Denies injury. Denies fall. States leg pain while walking. States has happened before with no dx. Hx stroke.

## 2018-10-17 ENCOUNTER — Inpatient Hospital Stay: Payer: Medicare HMO | Admitting: Family Medicine

## 2018-10-17 LAB — MAGNESIUM: Magnesium: 1.7 mg/dL (ref 1.7–2.4)

## 2018-10-17 LAB — GLUCOSE, CAPILLARY
Glucose-Capillary: 131 mg/dL — ABNORMAL HIGH (ref 70–99)
Glucose-Capillary: 105 mg/dL — ABNORMAL HIGH (ref 70–99)
Glucose-Capillary: 140 mg/dL — ABNORMAL HIGH (ref 70–99)
Glucose-Capillary: 118 mg/dL — ABNORMAL HIGH (ref 70–99)

## 2018-10-17 LAB — CBC
HEMATOCRIT: 28 % — AB (ref 39.0–52.0)
Hemoglobin: 8.6 g/dL — ABNORMAL LOW (ref 13.0–17.0)
MCH: 29.2 pg (ref 26.0–34.0)
MCHC: 30.7 g/dL (ref 30.0–36.0)
MCV: 94.9 fL (ref 80.0–100.0)
PLATELETS: 177 10*3/uL (ref 150–400)
RBC: 2.95 MIL/uL — AB (ref 4.22–5.81)
RDW: 15.4 % (ref 11.5–15.5)
WBC: 20.4 10*3/uL — ABNORMAL HIGH (ref 4.0–10.5)
nRBC: 0.1 % (ref 0.0–0.2)

## 2018-10-17 LAB — BASIC METABOLIC PANEL
ANION GAP: 10 (ref 5–15)
BUN: 13 mg/dL (ref 8–23)
CO2: 23 mmol/L (ref 22–32)
Calcium: 7.6 mg/dL — ABNORMAL LOW (ref 8.9–10.3)
Chloride: 103 mmol/L (ref 98–111)
Creatinine, Ser: 0.98 mg/dL (ref 0.61–1.24)
GFR calc Af Amer: >60 mL/min (ref >60)
GFR calc non Af Amer: >60 mL/min (ref >60)
GLUCOSE: 131 mg/dL — AB (ref 70–99)
Potassium: 3.3 mmol/L — ABNORMAL LOW (ref 3.5–5.1)
Sodium: 136 mmol/L (ref 135–145)

## 2018-10-17 LAB — PROTIME-INR
INR: 1.32
Prothrombin Time: 16.2 seconds — ABNORMAL HIGH (ref 11.4–15.2)

## 2018-10-17 LAB — PROCALCITONIN: Procalcitonin: 1.2 ng/mL

## 2018-10-17 LAB — CK: CK TOTAL: 20 U/L — AB (ref 49–397)

## 2018-10-17 LAB — CORTISOL-AM, BLOOD: CORTISOL - AM: 20.1 ug/dL (ref 6.7–22.6)

## 2018-10-17 MED ORDER — POTASSIUM CHLORIDE CRYS ER 20 MEQ PO TBCR
40.0000 meq | EXTENDED_RELEASE_TABLET | Freq: Once | ORAL | Status: AC
  Administered 2018-10-17: 40 meq via ORAL
  Filled 2018-10-17: qty 2

## 2018-10-17 MED ORDER — MAGNESIUM SULFATE 2 GM/50ML IV SOLN
2.0000 g | Freq: Once | INTRAVENOUS | Status: AC
  Administered 2018-10-17: 2 g via INTRAVENOUS
  Filled 2018-10-17: qty 50

## 2018-10-17 MED ORDER — SODIUM CHLORIDE 0.9 % IV SOLN
INTRAVENOUS | Status: DC
  Administered 2018-10-17: 22:00:00 via INTRAVENOUS

## 2018-10-17 NOTE — Evaluation (Signed)
Physical Therapy Evaluation Patient Details Name: Richard Gordon MRN: [id number] DOB: July 24, 1951 Today's Date: 10/17/2018   History of Present Illness  Pt is a 67 y/o M who presented with stiff legs, difficulty walking, weakness, fatigue.  Pt admitted for endocarditis.  The patient was recently admitted and being worked up for sepsis and possible endocarditis but left because he was tired of being in the hospital. Pt's PMH includes stroke x2, syncope.     Clinical Impression  Pt admitted with above diagnosis. Pt currently with functional limitations due to the deficits listed below (see PT Problem List).  Mr. Silbernagel reports increasing instability over the past few months.  This date he demonstrates instability ambulating with [facility] which improves some with introduction of RW, but pt still requiring intermittent min assist to remain steady.  HR up to 120 when ambulating.  Pt reports his brother will be available to provide 24/7 assist at d/c if needed. Pt will benefit from skilled PT to increase their independence and safety with mobility to allow discharge to the venue listed below.      Follow Up Recommendations Home health PT;Supervision for mobility/OOB    Equipment Recommendations  Rolling walker with 5" wheels    Recommendations for Other Services       Precautions / Restrictions Precautions Precautions: Fall;Other (comment) Precaution Comments: monitor HR Restrictions Weight Bearing Restrictions: No      Mobility  Bed Mobility Overal bed mobility: Modified Independent             General bed mobility comments: Increased effort but no physical assist ro cues needed.  HOB flat to simulate home environment.   Transfers Overall transfer level: Needs assistance Equipment used: Straight cane Transfers: Sit to/from Stand Sit to Stand: Min guard;Min assist         General transfer comment: Pt requires close min guard assist for sit>stand from bed and then reaching for SPC.   However, when pt is holding onto [facility] while performing sit>stand he requires min assist due to posterior LOB.  Cues to reach back for bed when preparing to sit.   Ambulation/Gait Ambulation/Gait assistance: Min assist Gait Distance (Feet): 40 Feet Assistive device: Rolling walker (2 wheeled);Straight cane Gait Pattern/deviations: Step-to pattern;Decreased dorsiflexion - left;Decreased dorsiflexion - right;Staggering left;Staggering right     General Gait Details: Pt ambulated 10 ft in room with SPC staggering L and R and requiring min assist to remain steady.  Improve stability with introduction of RW but pt requires intermittent min assist due to instability.  Cues to slow down for improved stability and to not push RW too far ahead.  HR up to 120 with ambulation, thus limiting distance.   Stairs            Wheelchair Mobility    Modified Rankin (Stroke Patients Only)       Balance Overall balance assessment: Needs assistance Sitting-balance support: No upper extremity supported;Feet supported Sitting balance-Leahy Scale: Fair Sitting balance - Comments: Pt able to sit EOB without UE support but would likely lose his balance with perturbation   Standing balance support: Single extremity supported;During functional activity Standing balance-Leahy Scale: Poor Standing balance comment: Pt relies on at least 1UE support for static and dynamic activities.                              Pertinent Vitals/Pain Pain Assessment: 0-10 Pain Score: 9 (pt smiling while providing this number)  Pain Location: Bil thighs(on/off for the past 3-4 months) Pain Descriptors / Indicators: Shooting;Sharp Pain Intervention(s): Limited activity within patient's tolerance;Monitored during session    Home Living Family/patient expects to be discharged to:: Private residence Living Arrangements: Alone Available Help at Discharge: Family;Available 24 hours/day(brother lives nearby and can  stay with pt if needed) Type of Home: Mobile home Home Access: Stairs to enter Entrance Stairs-Rails: Psychiatric nurse of Steps: 4 Home Layout: One level(with 1 very short step inside home with no rail or grab bar) Home Equipment: Cane - single point      Prior Function Level of Independence: Independent         Comments: [place] and denies any imbalance.  Does not typically use AD but over the past few weeks has been using SPC intermittently as his LEs have felt sore.  Pt denies any falls in the past 6 months.  Pt ind with bathing, dressing, cooking, cleaning.      Hand Dominance        Extremity/Trunk Assessment   Upper Extremity Assessment Upper Extremity Assessment: LUE deficits/detail LUE Deficits / Details: Strength grossly 4-/5. LUE Sensation: (tingling L lower arm since h/o stroke)    Lower Extremity Assessment Lower Extremity Assessment: LLE deficits/detail LLE Deficits / Details: Strength grossly 4-/5 LLE Sensation: (tingling LLE since h/o stroke)       Communication   Communication: No difficulties  Cognition Arousal/Alertness: Awake/alert Behavior During Therapy: WFL for tasks assessed/performed;Impulsive(impulsive with mobility (poor safety awareness)) Overall Cognitive Status: No family/caregiver present to determine baseline cognitive functioning                                 General Comments: Pt not oriented to date.  Impulsive with mobility and poor safety awareness.       General Comments General comments (skin integrity, edema, etc.): HR in supine at rest: 96, with bed mobility 100, ambulating in hallway 120, at rest in sitting at end of session 103. RN notified.     Exercises     Assessment/Plan    PT Assessment Patient needs continued PT services  PT Problem List Decreased strength;Decreased balance;Decreased knowledge of use of DME;Decreased safety  awareness;Pain       PT Treatment Interventions DME instruction;Gait training;Stair training;Functional mobility training;Therapeutic activities;Therapeutic exercise;Balance training;Neuromuscular re-education;Patient/family education    PT Goals (Current goals can be found in the Care Plan section)  Acute Rehab PT Goals Patient Stated Goal: to return home PT Goal Formulation: With patient Time For Goal Achievement: 10/31/18 Potential to Achieve Goals: Good    Frequency Min 2X/week   Barriers to discharge        Co-evaluation               AM-PAC PT "6 Clicks" Daily Activity  Outcome Measure Difficulty turning over in bed (including adjusting bedclothes, sheets and blankets)?: A Little Difficulty moving from lying on back to sitting on the side of the bed? : A Little Difficulty sitting down on and standing up from a chair with arms (e.g., wheelchair, bedside commode, etc,.)?: Unable Help needed moving to and from a bed to chair (including a wheelchair)?: A Little Help needed walking in hospital room?: A Little Help needed climbing 3-5 steps with a railing? : A Little 6 Click Score: 16    End of Session Equipment Utilized During Treatment: Gait belt Activity  Tolerance: Patient tolerated treatment well;Treatment limited secondary to medical complications (Comment)(limited by elevated HR with ambulation) Patient left: in chair;with call bell/phone within reach;with chair alarm set Nurse Communication: Mobility status;Other (comment)(HR) PT Visit Diagnosis: Unsteadiness on feet (R26.81);Other abnormalities of gait and mobility (R26.89);Muscle weakness (generalized) (M62.81)    Time: 1610-9604 PT Time Calculation (min) (ACUTE ONLY): 32 min   Charges:   PT Evaluation $PT Eval Moderate Complexity: 1 Mod PT Treatments $Gait Training: 8-22 mins        Collie Siad PT, DPT 10/17/2018, 1:07 PM

## 2018-10-17 NOTE — Care Management Note (Signed)
Case Management Note  Patient Details  Name: Richard Gordon MRN: [id number] Date of Birth: 06/19/1951  Subjective/Objective:                 Patient readmitted within 48 hours after a stay for sepsis and possible endocarditis.  It was verbally re ported to CM by several care team members that patient left AMA on 11.10.2019.  It is documented that patient was adamant about discharging on 11.10.2019 because was tired of being in the hospital. Attending informed patient that it was not advised to leave before treatment / TEE completed.  There is a discharge order present and patient received prescriptions for medications with  discharge instructions. His white count was still elevated at 16.7. and up to 31. on 11/12.  TEE to be scheduled to rule out endocarditis. There is a possibility that if rules in for this, he will need long term IV antibiotics. Cardiology and ID consulting   Action/Plan:   Expected Discharge Date:                  Expected Discharge Plan:     In-House Referral:     Discharge planning Services     Post Acute Care Choice:    Choice offered to:     DME Arranged:    DME Agency:     HH Arranged:    HH Agency:     Status of Service:     If discussed at [employer], dates discussed:    Additional Comments:  Katrina Stack, RN 10/17/2018, 5:54 PM

## 2018-10-17 NOTE — Progress Notes (Signed)
ID Pt back again Was being worked up for FUO and leucocytosis last admission until he wwas discharged and then he is readmitted for pain legs And back pain Need TEE Need Imaging of thoracolumbar spine to look for any infection If above unrevealing will need WBC tagged scan or PET

## 2018-10-17 NOTE — Plan of Care (Signed)
  Problem: Clinical Measurements: Goal: Cardiovascular complication will be avoided Outcome: Progressing   Problem: Pain Managment: Goal: General experience of comfort will improve Outcome: Progressing   Problem: Safety: Goal: Ability to remain free from injury will improve Outcome: Progressing

## 2018-10-17 NOTE — Progress Notes (Signed)
Patient ID: Richard Gordon, male   DOB: 23-Dec-1950, 67 y.o.   MRN: [id number]  [employer] PROGRESS NOTE  DEMARQUS JOCSON UYQ:[id number] DOB: 04-20-1951 DOA: 10/16/2018 PCP: Leone Haven, MD  HPI/Subjective: Patient left AGAINST MEDICAL ADVICE last hospitalization.  They were in the process of ruling him out for endocarditis with multiple teeth abscesses.  The patient states that he thought he could have made it on his own but got weaker and weaker with some soreness in his thighs.  Patient has had some weight loss.  No fever at home that he noticed.  Objective: Vitals:   10/17/18 0439 10/17/18 0747  BP: (!) 161/92 (!) 140/53  Pulse: 98 96  Resp: 18   Temp: 100.3 F (37.9 C) 98.3 F (36.8 C)  SpO2: 100% 98%    Intake/Output Summary (Last 24 hours) at 10/17/2018 1343 Last data filed at 10/17/2018 1145 Gross per 24 hour  Intake 3 ml  Output 2750 ml  Net -2747 ml   Filed Weights   10/16/18 1227 10/16/18 1730  Weight: 74.8 kg 69.7 kg    ROS: Review of Systems  Constitutional: Positive for malaise/fatigue. Negative for chills and fever.  Eyes: Negative for blurred vision.  Respiratory: Negative for cough and shortness of breath.   Cardiovascular: Negative for chest pain.  Gastrointestinal: Negative for abdominal pain, constipation, diarrhea, nausea and vomiting.  Genitourinary: Negative for dysuria.  Musculoskeletal: Positive for myalgias. Negative for joint pain.  Neurological: Negative for dizziness and headaches.   Exam: Physical Exam  Constitutional: He is oriented to person, place, and time.  HENT:  Nose: No mucosal edema.  Mouth/Throat: No oropharyngeal exudate or posterior oropharyngeal edema.  Poor dentition.  Patient states he had teeth removed and on his left lower jaw.  Eyes: Pupils are equal, round, and reactive to light. Conjunctivae, EOM and lids are normal.  Neck: No JVD present. Carotid bruit is not present. No edema present. No thyroid mass and no  thyromegaly present.  Cardiovascular: S1 normal and S2 normal. Exam reveals no gallop.  Murmur heard.  Systolic murmur is present with a grade of 2/6. Pulses:      Dorsalis pedis pulses are 2+ on the right side, and 2+ on the left side.  Respiratory: No respiratory distress. He has no wheezes. He has no rhonchi. He has no rales.  GI: Soft. Bowel sounds are normal. There is no tenderness.  Musculoskeletal:       Right ankle: He exhibits no swelling.       Left ankle: He exhibits no swelling.  Lymphadenopathy:    He has no cervical adenopathy.  Neurological: He is alert and oriented to person, place, and time. No cranial nerve deficit.  Skin: Skin is warm. No rash noted. Nails show no clubbing.  Psychiatric: He has a normal mood and affect.      Data Reviewed: Basic Metabolic Panel: Recent Labs  Lab 10/11/18 0713 10/12/18 0516 10/14/18 0801 10/16/18 1236 10/16/18 1719 10/17/18 0528  NA 135 136 137 137  --  136  K 3.7 3.5 3.2* 3.3*  --  3.3*  CL 105 109 106 101  --  103  CO2 20* 21* 23 23  --  23  GLUCOSE 85 114* 109* 193*  --  131*  BUN 31* 31* 16 11  --  13  CREATININE 1.33* 1.02 0.72 1.13 1.14 0.98  CALCIUM 7.5* 7.5* 7.5* 8.3*  --  7.6*  MG  --   --   --   --   --  1.7   Liver Function Tests: Recent Labs  Lab 10/12/18 0516 10/16/18 1236  AST 72* 40  ALT 61* 39  ALKPHOS 192* 237*  BILITOT 1.0 1.1  PROT 5.1* 6.7  ALBUMIN 2.0* 3.0*   CBC: Recent Labs  Lab 10/13/18 0446 10/14/18 0801 10/16/18 1236 10/16/18 1719 10/17/18 0528  WBC 16.0* 16.7* 31.1* 23.9* 20.4*  NEUTROABS  --   --  18.2*  --   --   HGB 8.8* 9.6* 10.7* 9.2* 8.6*  HCT 28.1* 30.1* 34.1* 29.4* 28.0*  MCV 94.3 94.4 95.3 95.5 94.9  PLT 197 211 259 189 177   Cardiac Enzymes: Recent Labs  Lab 10/16/18 1236  CKTOTAL 15*  TROPONINI <0.03    CBG: Recent Labs  Lab 10/14/18 0821 10/14/18 1204 10/16/18 2053 10/17/18 0748 10/17/18 1143  GLUCAP 124* 121* 183* 131* 105*    Recent Results  (from the past 240 hour(s))  Blood culture (routine x 2)     Status: None   Collection Time: 10/09/18  9:45 AM  Result Value Ref Range Status   Specimen Description BLOOD LEFT ANTECUBITAL  Final   Special Requests Blood Culture adequate volume  Final   Culture   Final    NO GROWTH 5 DAYS Performed at [facility], [address], [place], [place] [address]    Report Status 10/14/2018 FINAL  Final  Blood culture (routine x 2)     Status: None   Collection Time: 10/09/18 11:24 AM  Result Value Ref Range Status   Specimen Description BLOOD LEFT ANTECUBITAL  Final   Special Requests Blood Culture adequate volume  Final   Culture   Final    NO GROWTH 5 DAYS Performed at [facility], [address], [place], [place] [address]    Report Status 10/14/2018 FINAL  Final  Urine culture     Status: None   Collection Time: 10/09/18 11:24 AM  Result Value Ref Range Status   Specimen Description   Final    URINE, RANDOM Performed at [facility], [address] [place], [place] [address]    Special Requests   Final    NONE Performed at [facility], [address], [place], [place] [address]    Culture   Final    NO GROWTH Performed at [facility], [place] [address] [place], [place] [address]    Report Status 10/10/2018 FINAL  Final  Culture, blood (routine x 2)     Status: None (Preliminary result)   Collection Time: 10/16/18  3:23 PM  Result Value Ref Range Status   Specimen Description BLOOD RIGHT ANTECUBITAL  Final   Special Requests   Final    BOTTLES DRAWN AEROBIC AND ANAEROBIC Blood Culture adequate volume   Culture   Final    NO GROWTH < 24 HOURS Performed at [facility], [address] [place],  [address]    Report Status PENDING  Incomplete  Culture, blood (routine x 2)     Status: None (Preliminary result)   Collection Time: 10/16/18  3:24 PM  Result Value Ref Range Status   Specimen Description BLOOD  BLOOD LEFT HAND  Final   Special Requests   Final    BOTTLES DRAWN AEROBIC AND ANAEROBIC Blood Culture results may not be optimal due to an excessive volume of blood received in culture bottles   Culture   Final    NO GROWTH < 24 HOURS Performed at [facility], [address] [place], [place] [address]    Report Status PENDING  Incomplete     Studies: Dg Chest Portable 1 View  Result Date: 10/16/2018 CLINICAL DATA:  Generalized body aches and weakness, possible sepsis EXAM: PORTABLE CHEST 1 VIEW COMPARISON:  10/10/2018 FINDINGS: Cardiac shadow is within normal limits. The lungs are well aerated bilaterally. No focal infiltrate or sizable effusion is seen. No acute bony abnormality is noted. IMPRESSION: No acute abnormality seen. Electronically Signed   By: Inez Catalina M.D.   On: 10/16/2018 16:00    Scheduled Meds: . aspirin EC  81 mg Oral Daily  . diltiazem  30 mg Oral Q8H  . docusate sodium  100 mg Oral BID  . enoxaparin (LOVENOX) injection  40 mg Subcutaneous Q24H  . feeding supplement (GLUCERNA SHAKE)  237 mL Oral TID BM  . insulin aspart  0-15 Units Subcutaneous TID WC  . insulin aspart  0-5 Units Subcutaneous QHS  . insulin glargine  20 Units Subcutaneous QHS  . lisinopril  20 mg Oral Daily  . simvastatin  5 mg Oral q1800  . sodium chloride flush  3 mL Intravenous Q12H   Continuous Infusions: . sodium chloride    . sodium chloride    . ceFEPime (MAXIPIME) IV 2 g (10/17/18 0847)  . metronidazole 500 mg (10/17/18 5643)  . vancomycin 1,000 mg (10/17/18 1327)    Assessment/Plan:  1. Clinical sepsis with leukocytosis and fever.  Dental abscesses and possible endocarditis.  TEE tomorrow to rule out endocarditis.  Blood cultures so far negative. Continue aggressive antibiotics with vancomycin cefepime and Flagyl. 2. Weakness.  Appreciate physical therapy evaluation.  Add on a CPK.  Get rid of simvastatin. 3. Hypokalemia and hypomagnesemia.  Get rid of  hydrochlorothiazide.  Replace magnesium IV.  Replace potassium orally. 4. Type 2 diabetes mellitus on Lantus insulin and sliding scale. 5. Hypertension and tachycardia will start Cardizem CD and continue lisinopril. 6. History of stroke on aspirin  Code Status:     Code Status Orders  (From admission, onward)         Start     Ordered   10/16/18 1713  Full code  Continuous     10/16/18 1712        Code Status History    Date Active Date Inactive Code Status Order ID Comments User Context   10/09/2018 2301 10/14/2018 1535 Full Code [id number]  Demetrios Loll, MD Inpatient     Disposition Plan: TEE tomorrow to see if he has endocarditis  Consultants:  Cardiology for TEE  Infectious disease  Antibiotics:  Vancomycin  Cefepime  Flagyl  Time spent: 28 minutes  [place]

## 2018-10-17 NOTE — Progress Notes (Signed)
Dr. Ubaldo Glassing paged to ask about patient's TEE procedure. Per Dr. Ubaldo Glassing the procedure will be done tomorrow. Patient may eat. Diet order put in for patient. Will continue to monitor patient.

## 2018-10-18 ENCOUNTER — Inpatient Hospital Stay
Admit: 2018-10-18 | Discharge: 2018-10-18 | Disposition: A | Discharge status: Home / Self Care | Payer: Medicare HMO | Attending: Cardiology | Admitting: Cardiology

## 2018-10-18 ENCOUNTER — Encounter
Admission: EM | Disposition: A | Discharge status: Other Acute Inpatient Hospital | Payer: Self-pay | Source: Home / Self Care | Attending: Internal Medicine

## 2018-10-18 HISTORY — PX: TEE WITHOUT CARDIOVERSION: SHX5443

## 2018-10-18 LAB — GLUCOSE, CAPILLARY
GLUCOSE-CAPILLARY: 125 mg/dL — AB (ref 70–99)
Glucose-Capillary: 133 mg/dL — ABNORMAL HIGH (ref 70–99)
Glucose-Capillary: 117 mg/dL — ABNORMAL HIGH (ref 70–99)
Glucose-Capillary: 136 mg/dL — ABNORMAL HIGH (ref 70–99)
Glucose-Capillary: 128 mg/dL — ABNORMAL HIGH (ref 70–99)

## 2018-10-18 LAB — VANCOMYCIN, TROUGH: Vancomycin Tr: 17 ug/mL (ref 15–20)

## 2018-10-18 SURGERY — ECHOCARDIOGRAM, TRANSESOPHAGEAL
Anesthesia: Moderate Sedation

## 2018-10-18 MED ORDER — MIDAZOLAM HCL 5 MG/5ML IJ SOLN
INTRAMUSCULAR | Status: AC | PRN
  Administered 2018-10-18: 2 mg via INTRAVENOUS
  Administered 2018-10-18 (×2): 1 mg via INTRAVENOUS

## 2018-10-18 MED ORDER — FENTANYL CITRATE (PF) 100 MCG/2ML IJ SOLN
INTRAMUSCULAR | Status: AC
  Filled 2018-10-18: qty 2

## 2018-10-18 MED ORDER — LIDOCAINE VISCOUS HCL 2 % MT SOLN
OROMUCOSAL | Status: AC
  Filled 2018-10-18: qty 15

## 2018-10-18 MED ORDER — SODIUM CHLORIDE FLUSH 0.9 % IV SOLN
INTRAVENOUS | Status: AC
  Filled 2018-10-18: qty 10

## 2018-10-18 MED ORDER — MIDAZOLAM HCL 5 MG/5ML IJ SOLN
INTRAMUSCULAR | Status: AC
  Filled 2018-10-18: qty 5

## 2018-10-18 MED ORDER — BUTAMBEN-TETRACAINE-BENZOCAINE 2-2-14 % EX AERO
INHALATION_SPRAY | CUTANEOUS | Status: AC
  Filled 2018-10-18: qty 5

## 2018-10-18 MED ORDER — FENTANYL CITRATE (PF) 100 MCG/2ML IJ SOLN
INTRAMUSCULAR | Status: AC | PRN
  Administered 2018-10-18: 50 ug via INTRAVENOUS
  Administered 2018-10-18 (×2): 25 ug via INTRAVENOUS

## 2018-10-18 MED ORDER — SODIUM CHLORIDE 0.9 % IV SOLN
2.0000 g | INTRAVENOUS | Status: DC
  Administered 2018-10-18 – 2018-10-25 (×8): 2 g via INTRAVENOUS
  Filled 2018-10-18 (×8): qty 2

## 2018-10-18 NOTE — Plan of Care (Signed)
Patient has been afebrile this shift. Patient has complained of bilateral leg pain. Patient is NPO for TEE on 11/14. Patient has periods of intermittent confusion. Patient became disoriented to time and location. Patient was walking around in the hallway without assistance. Nurse tech redirected him back to the room.

## 2018-10-18 NOTE — Progress Notes (Signed)
Pharmacy Antibiotic Note  Richard Gordon is a 67 y.o. male admitted on 10/16/2018 with sepsis.  Pharmacy has been consulted for vancomycin, and cefepime dosing.  Ke: 0.058 hr-1, t1/2: 11.95, Vd: 52.36   Plan: Vancomycin level therapeutic at 17. Will continue vanc at 1g q 12 hr. Timing of doses a little off, however level was 12 hr after last dose. Check another level after 4 more doses.  Continue cefepime 2 g q8H.   Height: 5' 10" (177.8 cm) Weight: 151 lb (68.5 kg) IBW/kg (Calculated) : 73  Temp (24hrs), Avg:98.8 F (37.1 C), Min:98.3 F (36.8 C), Max:100 F (37.8 C)  Recent Labs  Lab 10/12/18 0516  10/13/18 0446 10/14/18 0801 10/16/18 1236 10/16/18 1523 10/16/18 1719 10/17/18 0528 10/18/18 1130  WBC 24.3*  --  16.0* 16.7* 31.1*  --  23.9* 20.4*  --   CREATININE 1.02  --   --  0.72 1.13  --  1.14 0.98  --   LATICACIDVEN  --   --   --   --   --  1.4 1.1  --   --   VANCOTROUGH  --    < >  --  20  --   --   --   --  17   < > = values in this interval not displayed.    Estimated Creatinine Clearance: 69.9 mL/min (by C-G formula based on SCr of 0.98 mg/dL).    No Known Allergies  Antimicrobials this admission: 11/12 cefepime >>  11/12 vancomycin >>  11/12 metronidazole >> 11/12  Dose adjustments this admission: None  Microbiology results: 11/12 BCx 2/2: Pending TEE with possible vegetation on aortic valve  Thank you for allowing pharmacy to be a part of this patient's care.  Ramond Dial, Pharm.D, BCPS Clinical Pharmacist 10/18/2018 1:27 PM

## 2018-10-18 NOTE — CV Procedure (Signed)
TRANSESOPHAGEAL ECHOCARDIOGRAM   NAME:  Richard Gordon   MRN: [id number] DOB:  [birth year]   ADMIT DATE: 10/16/2018  INDICATIONS: Bacteremia  PROCEDURE:   Informed consent was obtained prior to the procedure. The risks, benefits and alternatives for the procedure were discussed and the patient comprehended these risks.  Risks include, but are not limited to, cough, sore throat, vomiting, nausea, somnolence, esophageal and stomach trauma or perforation, bleeding, low blood pressure, aspiration, pneumonia, infection, trauma to the teeth and death.    After a procedural time-out, the patient was given 4 mg versed and 100 mcg fentanyl to achieve moderate sedation for 20 min.  The oropharynx was anesthetized 3 cc of topical 1% viscous lidocaine.  The transesophageal probe was inserted in the esophagus and stomach without difficulty and multiple views were obtained. I was present for the entire procedure.    COMPLICATIONS:    There were no immediate complications.  FINDINGS:  LEFT VENTRICLE: EF = 55%. No regional wall motion abnormalities.  RIGHT VENTRICLE: Normal size and function.   LEFT ATRIUM: Upper limits of normal. No thrombus  LEFT ATRIAL APPENDAGE: No thrombus.   RIGHT ATRIUM: Normal   AORTIC VALVE:  Trileaflet. Probable vegetation.on left coronary leaflet. Trivial ai. No stenosis  MITRAL VALVE:    Normal. No vegetations, mild mr  TRICUSPID VALVE: Normal. Mild tr no vegetation  PULMONIC VALVE: Grossly normal.  INTERATRIAL SEPTUM: No PFO or ASD. Agitated saline contrast was used.   PERICARDIUM: No effusion  DESCENDING AORTA:  normal CONCLUSION: Normal lv function Probable vegetation on aortic valve with no significant ai  No asd

## 2018-10-18 NOTE — Progress Notes (Signed)
Patient ID: Richard Gordon, male   DOB: 04-May-1951, 67 y.o.   MRN: [id number]   [employer] PROGRESS NOTE  Richard Gordon MWN:[id number] DOB: [birth year] DOA: 10/16/2018 PCP: Leone Haven, MD  HPI/Subjective: Patient feels okay.  Offers no complaints.  He states sometimes at night he has trouble using the urinal.  Objective: Vitals:   10/18/18 0830 10/18/18 0904  BP: (!) 144/57 137/63  Pulse: 96 (!) 101  Resp: 14 15  Temp:  100 F (37.8 C)  SpO2: 98% 96%    Filed Weights   10/16/18 1227 10/16/18 1730 10/18/18 0432  Weight: 74.8 kg 69.7 kg 68.5 kg    ROS: Review of Systems  Constitutional: Positive for malaise/fatigue. Negative for chills and fever.  Eyes: Negative for blurred vision.  Respiratory: Negative for cough and shortness of breath.   Cardiovascular: Negative for chest pain.  Gastrointestinal: Negative for abdominal pain, constipation, diarrhea, nausea and vomiting.  Genitourinary: Negative for dysuria.  Musculoskeletal: Positive for myalgias. Negative for joint pain.  Neurological: Negative for dizziness and headaches.   Exam: Physical Exam  Constitutional: He is oriented to person, place, and time.  HENT:  Nose: No mucosal edema.  Mouth/Throat: No oropharyngeal exudate or posterior oropharyngeal edema.  Poor dentition.  Patient states he had teeth removed and on his left lower jaw.  Eyes: Pupils are equal, round, and reactive to light. Conjunctivae, EOM and lids are normal.  Neck: No JVD present. Carotid bruit is not present. No edema present. No thyroid mass and no thyromegaly present.  Cardiovascular: S1 normal and S2 normal. Exam reveals no gallop.  Murmur heard.  Systolic murmur is present with a grade of 2/6. Pulses:      Dorsalis pedis pulses are 2+ on the right side, and 2+ on the left side.  Respiratory: No respiratory distress. He has no wheezes. He has no rhonchi. He has no rales.  GI: Soft. Bowel sounds are normal. There is no tenderness.   Musculoskeletal:       Right ankle: He exhibits no swelling.       Left ankle: He exhibits no swelling.  Lymphadenopathy:    He has no cervical adenopathy.  Neurological: He is alert and oriented to person, place, and time. No cranial nerve deficit.  Skin: Skin is warm. No rash noted. Nails show no clubbing.  Psychiatric: He has a normal mood and affect.      Data Reviewed: Basic Metabolic Panel: Recent Labs  Lab 10/12/18 0516 10/14/18 0801 10/16/18 1236 10/16/18 1719 10/17/18 0528  NA 136 137 137  --  136  K 3.5 3.2* 3.3*  --  3.3*  CL 109 106 101  --  103  CO2 21* 23 23  --  23  GLUCOSE 114* 109* 193*  --  131*  BUN 31* 16 11  --  13  CREATININE 1.02 0.72 1.13 1.14 0.98  CALCIUM 7.5* 7.5* 8.3*  --  7.6*  MG  --   --   --   --  1.7   Liver Function Tests: Recent Labs  Lab 10/12/18 0516 10/16/18 1236  AST 72* 40  ALT 61* 39  ALKPHOS 192* 237*  BILITOT 1.0 1.1  PROT 5.1* 6.7  ALBUMIN 2.0* 3.0*   CBC: Recent Labs  Lab 10/13/18 0446 10/14/18 0801 10/16/18 1236 10/16/18 1719 10/17/18 0528  WBC 16.0* 16.7* 31.1* 23.9* 20.4*  NEUTROABS  --   --  18.2*  --   --   HGB  8.8* 9.6* 10.7* 9.2* 8.6*  HCT 28.1* 30.1* 34.1* 29.4* 28.0*  MCV 94.3 94.4 95.3 95.5 94.9  PLT 197 211 259 189 177   Cardiac Enzymes: Recent Labs  Lab 10/16/18 1236 10/17/18 0528  CKTOTAL 15* 20*  TROPONINI <0.03  --     CBG: Recent Labs  Lab 10/17/18 1639 10/17/18 2110 10/18/18 0727 10/18/18 0902 10/18/18 1123  GLUCAP 140* 118* 133* 117* 125*    Recent Results (from the past 240 hour(s))  Blood culture (routine x 2)     Status: None   Collection Time: 10/09/18  9:45 AM  Result Value Ref Range Status   Specimen Description BLOOD LEFT ANTECUBITAL  Final   Special Requests Blood Culture adequate volume  Final   Culture   Final    NO GROWTH 5 DAYS Performed at [facility], [address] [place], [place] [address]    Report Status 10/14/2018 FINAL  Final  Blood  culture (routine x 2)     Status: None   Collection Time: 10/09/18 11:24 AM  Result Value Ref Range Status   Specimen Description BLOOD LEFT ANTECUBITAL  Final   Special Requests Blood Culture adequate volume  Final   Culture   Final    NO GROWTH 5 DAYS Performed at [facility], [address] [place], [place] [address]    Report Status 10/14/2018 FINAL  Final  Urine culture     Status: None   Collection Time: 10/09/18 11:24 AM  Result Value Ref Range Status   Specimen Description   Final    URINE, RANDOM Performed at [facility], [address] [place], [place] [address]    Special Requests   Final    NONE Performed at [facility], [address], [place], [place] [address]    Culture   Final    NO GROWTH Performed at [facility], [place] [address] [place], [place] [address]    Report Status 10/10/2018 FINAL  Final  Culture, blood (routine x 2)     Status: None (Preliminary result)   Collection Time: 10/16/18  3:23 PM  Result Value Ref Range Status   Specimen Description BLOOD RIGHT ANTECUBITAL  Final   Special Requests   Final    BOTTLES DRAWN AEROBIC AND ANAEROBIC Blood Culture adequate volume   Culture   Final    NO GROWTH 2 DAYS Performed at [facility], [address], [place], [place] [address]    Report Status PENDING  Incomplete  Culture, blood (routine x 2)     Status: None (Preliminary result)   Collection Time: 10/16/18  3:24 PM  Result Value Ref Range Status   Specimen Description BLOOD BLOOD LEFT HAND  Final   Special Requests   Final    BOTTLES DRAWN AEROBIC AND ANAEROBIC Blood Culture results may not be optimal due to an excessive volume of blood received in culture bottles   Culture   Final    NO GROWTH 2 DAYS Performed at [facility], [address] [place],  [address]    Report Status PENDING  Incomplete      Scheduled Meds: . aspirin EC  81 mg Oral Daily  . diltiazem  30 mg  Oral Q8H  . docusate sodium  100 mg Oral BID  . enoxaparin (LOVENOX) injection  40 mg Subcutaneous Q24H  . feeding supplement (GLUCERNA SHAKE)  237 mL Oral TID BM  . insulin aspart  0-15 Units  Subcutaneous TID WC  . insulin aspart  0-5 Units Subcutaneous QHS  . insulin glargine  20 Units Subcutaneous QHS  . lisinopril  20 mg Oral Daily  . sodium chloride flush  3 mL Intravenous Q12H   Continuous Infusions: . sodium chloride 250 mL (10/18/18 1043)  . cefTRIAXone (ROCEPHIN)  IV    . metronidazole 500 mg (10/18/18 1515)  . vancomycin 1,000 mg (10/18/18 1354)    Assessment/Plan:  1. Clinical sepsis with culture negative endocarditis leukocytosis and fever.  Dental abscesses.  TEE showing probable aortic valve vegetation.  Blood cultures so far negative. Continue aggressive antibiotics with vancomycin cefepime and Flagyl.  Infectious disease to adjust antibiotics.  Infectious disease recommends an MRI of the thoracic and lumbar spine for further evaluation.  Watch temperature curve.  Patient had a temperature of 100 this morning.  This afternoon currently afebrile. 2. Weakness.  Appreciate physical therapy evaluation.  Get rid of simvastatin. 3. Hypokalemia and hypomagnesemia.  Get rid of hydrochlorothiazide.  Replace electrolytes.  Check CMP tomorrow. 4. Type 2 diabetes mellitus on Lantus insulin and sliding scale. 5. Hypertension and tachycardia.  Continue Cardizem CD and continue lisinopril. 6. History of stroke on aspirin.  We will get MRI of the brain to rule out septic emboli and/or stroke.  Code Status:     Code Status Orders  (From admission, onward)         Start     Ordered   10/16/18 1713  Full code  Continuous     10/16/18 1712        Code Status History    Date Active Date Inactive Code Status Order ID Comments User Context   10/09/2018 2301 10/14/2018 1535 Full Code [id number]  Demetrios Loll, MD Inpatient     Disposition Plan: Will likely need IV antibiotics and PICC  line  Consultants:  Cardiology for TEE  Infectious disease  Antibiotics:  Vancomycin  Cefepime  Flagyl  Time spent: 28 minutes  [place]

## 2018-10-18 NOTE — Progress Notes (Signed)
*  PRELIMINARY RESULTS* Echocardiogram Echocardiogram Transesophageal has been performed.  Richard Gordon 10/18/2018, 8:14 AM

## 2018-10-19 ENCOUNTER — Inpatient Hospital Stay: Payer: Medicare HMO

## 2018-10-19 ENCOUNTER — Encounter: Payer: Self-pay | Admitting: Cardiology

## 2018-10-19 DIAGNOSIS — R5383 Other fatigue: Secondary | ICD-10-CM

## 2018-10-19 DIAGNOSIS — Z98818 Other dental procedure status: Secondary | ICD-10-CM

## 2018-10-19 DIAGNOSIS — D72829 Elevated white blood cell count, unspecified: Secondary | ICD-10-CM

## 2018-10-19 DIAGNOSIS — E785 Hyperlipidemia, unspecified: Secondary | ICD-10-CM

## 2018-10-19 DIAGNOSIS — D7589 Other specified diseases of blood and blood-forming organs: Secondary | ICD-10-CM

## 2018-10-19 DIAGNOSIS — I129 Hypertensive chronic kidney disease with stage 1 through stage 4 chronic kidney disease, or unspecified chronic kidney disease: Secondary | ICD-10-CM

## 2018-10-19 DIAGNOSIS — N189 Chronic kidney disease, unspecified: Secondary | ICD-10-CM

## 2018-10-19 DIAGNOSIS — I358 Other nonrheumatic aortic valve disorders: Secondary | ICD-10-CM

## 2018-10-19 DIAGNOSIS — R509 Fever, unspecified: Secondary | ICD-10-CM

## 2018-10-19 DIAGNOSIS — Z792 Long term (current) use of antibiotics: Secondary | ICD-10-CM

## 2018-10-19 DIAGNOSIS — E1122 Type 2 diabetes mellitus with diabetic chronic kidney disease: Secondary | ICD-10-CM

## 2018-10-19 DIAGNOSIS — R32 Unspecified urinary incontinence: Secondary | ICD-10-CM

## 2018-10-19 DIAGNOSIS — M545 Low back pain: Secondary | ICD-10-CM

## 2018-10-19 DIAGNOSIS — Z8673 Personal history of transient ischemic attack (TIA), and cerebral infarction without residual deficits: Secondary | ICD-10-CM

## 2018-10-19 DIAGNOSIS — R531 Weakness: Secondary | ICD-10-CM

## 2018-10-19 DIAGNOSIS — M79605 Pain in left leg: Secondary | ICD-10-CM

## 2018-10-19 DIAGNOSIS — D649 Anemia, unspecified: Secondary | ICD-10-CM

## 2018-10-19 DIAGNOSIS — G8929 Other chronic pain: Secondary | ICD-10-CM

## 2018-10-19 LAB — CBC
HCT: 28.1 % — ABNORMAL LOW (ref 39.0–52.0)
Hemoglobin: 8.9 g/dL — ABNORMAL LOW (ref 13.0–17.0)
MCH: 30.2 pg (ref 26.0–34.0)
MCHC: 31.7 g/dL (ref 30.0–36.0)
MCV: 95.3 fL (ref 80.0–100.0)
Platelets: 163 10*3/uL (ref 150–400)
RBC: 2.95 MIL/uL — ABNORMAL LOW (ref 4.22–5.81)
RDW: 15.6 % — AB (ref 11.5–15.5)
WBC: 14.3 10*3/uL — AB (ref 4.0–10.5)
nRBC: 0 % (ref 0.0–0.2)

## 2018-10-19 LAB — COMPREHENSIVE METABOLIC PANEL
ALT: 15 U/L (ref 0–44)
AST: 15 U/L (ref 15–41)
Albumin: 2.4 g/dL — ABNORMAL LOW (ref 3.5–5.0)
Alkaline Phosphatase: 234 U/L — ABNORMAL HIGH (ref 38–126)
Anion gap: 11 (ref 5–15)
BUN: 14 mg/dL (ref 8–23)
CO2: 25 mmol/L (ref 22–32)
CREATININE: 0.93 mg/dL (ref 0.61–1.24)
Calcium: 7.8 mg/dL — ABNORMAL LOW (ref 8.9–10.3)
Chloride: 100 mmol/L (ref 98–111)
GFR calc Af Amer: >60 mL/min (ref >60)
Glucose, Bld: 113 mg/dL — ABNORMAL HIGH (ref 70–99)
Potassium: 3.5 mmol/L (ref 3.5–5.1)
Sodium: 136 mmol/L (ref 135–145)
Total Bilirubin: 0.8 mg/dL (ref 0.3–1.2)
Total Protein: 5.7 g/dL — ABNORMAL LOW (ref 6.5–8.1)

## 2018-10-19 LAB — GLUCOSE, CAPILLARY
Glucose-Capillary: 92 mg/dL (ref 70–99)
Glucose-Capillary: 132 mg/dL — ABNORMAL HIGH (ref 70–99)
Glucose-Capillary: 197 mg/dL — ABNORMAL HIGH (ref 70–99)
Glucose-Capillary: 120 mg/dL — ABNORMAL HIGH (ref 70–99)

## 2018-10-19 LAB — LIPASE, BLOOD: Lipase: 26 U/L (ref 11–51)

## 2018-10-19 LAB — URIC ACID: Uric Acid, Serum: 5 mg/dL (ref 3.7–8.6)

## 2018-10-19 LAB — LACTATE DEHYDROGENASE: LDH: 1239 U/L — ABNORMAL HIGH (ref 98–192)

## 2018-10-19 LAB — PSA: Prostatic Specific Antigen: 0.26 ng/mL (ref 0.00–4.00)

## 2018-10-19 MED ORDER — ASPIRIN EC 81 MG PO TBEC
81.0000 mg | DELAYED_RELEASE_TABLET | Freq: Every day | ORAL | Status: DC
  Administered 2018-10-19 – 2018-10-25 (×7): 81 mg via ORAL
  Filled 2018-10-19 (×7): qty 1

## 2018-10-19 MED ORDER — POTASSIUM CHLORIDE CRYS ER 20 MEQ PO TBCR
20.0000 meq | EXTENDED_RELEASE_TABLET | Freq: Once | ORAL | Status: AC
  Administered 2018-10-19: 20 meq via ORAL
  Filled 2018-10-19: qty 1

## 2018-10-19 MED ORDER — ENOXAPARIN SODIUM 40 MG/0.4ML ~~LOC~~ SOLN
40.0000 mg | SUBCUTANEOUS | Status: DC
  Administered 2018-10-19 – 2018-10-25 (×7): 40 mg via SUBCUTANEOUS
  Filled 2018-10-19 (×7): qty 0.4

## 2018-10-19 MED ORDER — GADOBUTROL 1 MMOL/ML IV SOLN
7.0000 mL | Freq: Once | INTRAVENOUS | Status: AC | PRN
  Administered 2018-10-19: 7 mL via INTRAVENOUS

## 2018-10-19 MED ORDER — METOPROLOL TARTRATE 25 MG PO TABS
12.5000 mg | ORAL_TABLET | Freq: Two times a day (BID) | ORAL | Status: DC
  Administered 2018-10-19 – 2018-10-22 (×6): 12.5 mg via ORAL
  Filled 2018-10-19 (×6): qty 1

## 2018-10-19 NOTE — Consult Note (Addendum)
[facility]  Date of admission:  10/16/2018  Inpatient day:  10/19/2018  Consulting physician: Dr. Loletha Grayer  Reason for Consultation:  Infiltrating area on bones.  Chief Complaint: Richard Gordon is a 67 y.o. male who was admitted through the emergency room with suspected bacterial endocarditis and sepsis.  HPI:  The patient notes a history of progressive leg achiness over the past 5-6 years.  Leg discomfort has recently progressed.  He states that 1 week ago he was able to walk independently and now he needs a walker.  He notes muscle wasting x 1 year.  He has been on a cholesterol medication for the past 9-10 years.  This has recently been held.  He notes a chronic history of low back pain.  He relates drinking [facility] due to his pain.  He denies any trauma or lifting injury.  He denies any numbess, focal weakness, balance or coordination issues.    He was admitted to [facility] from 10/09/2018 - 10/14/2018.  He presented with general body aches and intermittent nausea.  He had a left lower tooth extracted the week prior.  On presentation, he was tachycardic, tachypneic, and had leukocytosis.  WBC was 27,900 with left shift.  He was treated on the sepsis protocol with Cefepime, vancomycin, and Flagyl.  TTE revealed diastolic dysfunction.  Concern was raised for endocarditis.  WBC on 10/14/2018 was 16,700.  He left the hospital AMA.  Abdomen and pelvic CT on 10/10/2018 revealed soft tissue stranding in the retroperitoneal space of the abdomen, diffusely around the pancreas, adrenal glands, and kidneys. These changes were fairly evenly distributed without a definite epicenter making the appearance indeterminate. Changes around the pancreas could be related to pancreatitis and the perirenal edema could reflect pyelonephritis. Bilateral symmetric periadrenal edema/stranding was of uncertain significance.  There was no evidence for discrete, focal abscess in the  abdomen/pelvis.  There was a 4 mm right lower lobe pulmonary nodule.  Maxofacial CT on 10/11/2018 revealed no acute process in the face.  There were multiple periapical abscess without fluid collection.  He represented to [facility] ER on 10/16/2018 with vague complaints of generalized weakness, fatigue, and insomnia.  WBC was 31,000 with a left shift. He was again started on the sepsis protocol and Cefepime, vancomycin, and Flagyl started.  TEE on 10/18/2018 revealed probable vegetation on left coronary leaflet of aortic valve.  EF was 55 %.  Head MRI on 10/18/2018 revealed no acute process.    Thoracic and lumbar spine MRI on 10/18/2018 revealed diffusely abnormal bone marrow signal within the thoracolumbar and included sacral spine with heterogeneous enhancement highly concerning for infiltrative tumor (infiltrative metastatic disease vs myeloproliferative disorder or leukemia/lymphoma).  There was no pathologic fracture, atypical appearance for infection.  There was faint epidural enhancement, finding seen with venous congestion, less likely infection.  The patient states that he has lost 15 pounds (174 to 159 pounds) in an undetermined period of time.  He denies any fever or chills at home.  He denies any history of recurrent infection.  He notes decreased stamina x 1 year.  He states "I can't mow the lawn any more".  Review of labs: Patient has normal counts through 07/16/2018.  He subsequently developed a progressive anemia.  Hematocrit 34.1 with MCV 95.3 on admission .  Hematocrit was 28.0 with a hemoglobin 8.6 today.  WBC is slowly decreasing from 31,100 to 20,400 today.  Platelet count has been normal (259,000 to 177,000).  PT was 1.32  on 10/17/2018.    SPEP on 04/03/2018 was negative.  Ferritin was 48 with an iron saturation of 25% and a TIBC of 330 on 04/03/2018.  B12 was 711 and folate 32 on 04/03/2018.  TSH was 4.26 on 07/09/2018.  PSA was 0.26 on 10/19/2018.  Blood cultures are negative.   CK has been normal (15-41) on multiple checks between 10/07/2018 - 10/17/2018.  Alkaline phosphatase has been elevated since 10/07/2018 (229 on 10/07/2018; max 505 on 10/09/2018 and 234 on 10/19/2018).  Peripheral smear on 10/16/2018 revealed absolute leukocytosis with neutrophilia, left shifted maturation, and rare circulating blasts, compatible with clinical history of sepsis.  There was a mild normochromic, normocytic anemia.  Platelet count and morphology was normal.   He denies any family history of blood disorder or cancer.   Past Medical History:  Diagnosis Date  . Chronic kidney disease (CKD), stage III (moderate) ([place]) 11/03/2015  . Diabetes mellitus without complication ([place])   . Hyperlipidemia   . Hypertension   . Stroke [facility])    per pt 2 CVA in 2000,and 1998 (  R ,then L side effected seperately per pt )    Past Surgical History:  Procedure Laterality Date  . CATARACT EXTRACTION    . TEE WITHOUT CARDIOVERSION N/A 10/18/2018   Procedure: TRANSESOPHAGEAL ECHOCARDIOGRAM (TEE);  Surgeon: Teodoro Spray, MD;  Location: [facility] ORS;  Service: Cardiovascular;  Laterality: N/A;  . TONSILLECTOMY      Family History  Problem Relation Age of Onset  . Hypertension Father   . Diabetes Father   . Brain cancer Mother        per pt he was 70 and thinks this was cancer   . Coronary artery disease Sister        scarlet fever as child affected heart per pt    Social History:  reports that he quit smoking about 19 years ago. His smoking use included cigarettes. He has a 12.50 pack-year smoking history. He has quit using smokeless tobacco.  His smokeless tobacco use included snuff. He reports that he does not drink alcohol or use drugs.  He denies any exposures to radiation or toxins.  He is a retired Glass blower/designer.  He lives in [place]. He is alone today.  Allergies: No Known Allergies  Medications Prior to Admission  Medication Sig Dispense Refill  . aspirin EC 81 MG tablet Take 1  tablet (81 mg total) by mouth daily.    . blood glucose meter kit and supplies KIT Accu chek. Check twice daily. E11.42. 1 each 0  . cefdinir (OMNICEF) 300 MG capsule Take 1 capsule (300 mg total) by mouth 2 (two) times daily. 28 capsule 0  . diltiazem (CARDIZEM) 30 MG tablet Take 1 tablet (30 mg total) by mouth every 8 (eight) hours. 90 tablet 0  . empagliflozin (JARDIANCE) 10 MG TABS tablet Take 10 mg by mouth daily. 90 tablet 3  . feeding supplement, GLUCERNA SHAKE, (GLUCERNA SHAKE) LIQD Take 237 mLs by mouth 3 (three) times daily between meals. 90 Can 0  . glucose blood (ONETOUCH VERIO) test strip Check blood sugar twice daily Dx: E11.42 100 each 12  . hydrochlorothiazide (HYDRODIURIL) 12.5 MG tablet TAKE 1 TABLET DAILY (Patient taking differently: Take 12.5 mg by mouth daily. ) 90 tablet 0  . Insulin Glargine (BASAGLAR KWIKPEN) 100 UNIT/ML SOPN Inject 0.42 mLs (42 Units total) into the skin at bedtime. 5 pen 3  . Insulin Pen Needle 31G X 5 MM MISC 1 each  by Does not apply route daily. 100 each 3  . Lancets (ACCU-CHEK SOFT TOUCH) lancets Check blood sugar twice daily. DX: E11.42.  Quantity Sufficient 90 day supply 300 each 3  . lisinopril (PRINIVIL,ZESTRIL) 20 MG tablet TAKE 1 TABLET DAILY (Patient taking differently: Take 20 mg by mouth daily. ) 90 tablet 1  . metFORMIN (GLUCOPHAGE) 1000 MG tablet Take 1 tablet (1,000 mg total) by mouth 2 (two) times daily. 180 tablet 3  . simvastatin (ZOCOR) 5 MG tablet Take 1 tablet (5 mg total) by mouth daily at 6 PM. 90 tablet 3  . metroNIDAZOLE (FLAGYL) 500 MG tablet Take 1 tablet (500 mg total) by mouth 3 (three) times daily. 42 tablet 0    Review of Systems: GENERAL:  Fatigue.  Decreased stamina x 1 year.  No fevers.  Some sweats.  Weight loss of 15 pounds over uncertain period of time. PERFORMANCE STATUS (ECOG):  2 HEENT:  Left lower tooth broken off then extracted (see HPI).  Irritated throat and dry mouth since TEE.  No visual changes, runny nose,  mouth sores or tenderness. Lungs: No shortness of breath or cough.  No hemoptysis. Cardiac:  No chest pain, palpitations, orthopnea, or PND. GI:  Some nausea and vomiting in hospital.  No diarrhea, constipation, melena or hematochezia.  No prior colonoscopy. GU:  No urgency, frequency, dysuria, or hematuria.  No prior PSA until hospitalization. Musculoskeletal:  Back pain worsened after drinking [facility].  Legs ache from above knee to mid sock line bilaterally.  No muscle tenderness. Extremities:  Progressive decreased muscle mass in legs x 1 year.  No swelling. Skin:  No rashes or skin changes. Neuro:  Chronic left arm numbness s/p CVA.  No headache, weakness, balance or coordination issues. Endocrine:  No diabetes, thyroid issues, hot flashes or night sweats. Psych:  No mood changes, depression or anxiety. Pain:  No focal pain. Review of systems:  All other systems reviewed and found to be negative.  Physical Exam:  Blood pressure 139/62, pulse 100, temperature 98 F (36.7 C), temperature source Oral, resp. rate 15, height 5' 10" (1.778 m), weight 150 lb 1.6 oz (68.1 kg), SpO2 98 %.  GENERAL:  Chronically fatigued appearing gentleman sitting comfortably on the medical unit in no acute distress. MENTAL STATUS:  Alert and oriented to person, place and time. HEAD:  Thin red hair with graying of sideburns.  Male pattern baldness.  Normocephalic, atraumatic, face symmetric, no Cushingoid features. EYES:  Glasses.  Blue eyes.  Pupils equal round and reactive to light and accomodation.  No conjunctivitis or scleral icterus. ENT:  Oropharynx clear without lesion.   Left lower gum line s/p dental extraction.  Tongue normal. Mucous membranes moist.  RESPIRATORY:  Clear to auscultation without rales, wheezes or rhonchi. CARDIOVASCULAR:  Regular rate and rhythm without murmur, rub or gallop. ABDOMEN:  Soft, non-tender, with active bowel sounds, and no hepatosplenomegaly.  No masses. SKIN:  No  rashes, ulcers or lesions. EXTREMITIES: No stigmata of SBE.  No edema, no skin discoloration or tenderness.  No palpable cords. LYMPH NODES: No palpable cervical, supraclavicular, axillary or inguinal adenopathy  NEUROLOGICAL: Alert & oriented, cranial nerves II-XII grossly intact; motor strength 5/5 lower extremity; sensation intact; right patellar reflex 2+, left patellar reflex 1+, no clonus or Babinski.  Gait not tested. PSYCH:  Appropriate.   Results for orders placed or performed during the hospital encounter of 10/16/18 (from the past 48 hour(s))  Glucose, capillary     Status:  Abnormal   Collection Time: 10/17/18  4:39 PM  Result Value Ref Range   Glucose-Capillary 140 (H) 70 - 99 mg/dL   Comment 1 Notify RN    Comment 2 Document in Chart   Glucose, capillary     Status: Abnormal   Collection Time: 10/17/18  9:10 PM  Result Value Ref Range   Glucose-Capillary 118 (H) 70 - 99 mg/dL  Glucose, capillary     Status: Abnormal   Collection Time: 10/18/18  7:27 AM  Result Value Ref Range   Glucose-Capillary 133 (H) 70 - 99 mg/dL  Glucose, capillary     Status: Abnormal   Collection Time: 10/18/18  9:02 AM  Result Value Ref Range   Glucose-Capillary 117 (H) 70 - 99 mg/dL   Comment 1 Notify RN    Comment 2 Document in Chart   Glucose, capillary     Status: Abnormal   Collection Time: 10/18/18 11:23 AM  Result Value Ref Range   Glucose-Capillary 125 (H) 70 - 99 mg/dL   Comment 1 Notify RN    Comment 2 Document in Chart   Vancomycin, trough     Status: None   Collection Time: 10/18/18 11:30 AM  Result Value Ref Range   Vancomycin Tr 17 15 - 20 ug/mL    Comment: Performed at [facility], [address] [place], [place] [address]  Glucose, capillary     Status: Abnormal   Collection Time: 10/18/18  5:07 PM  Result Value Ref Range   Glucose-Capillary 136 (H) 70 - 99 mg/dL  Glucose, capillary     Status: Abnormal   Collection Time: 10/18/18  9:24 PM  Result Value Ref  Range   Glucose-Capillary 128 (H) 70 - 99 mg/dL  Comprehensive metabolic panel     Status: Abnormal   Collection Time: 10/19/18  5:29 AM  Result Value Ref Range   Sodium 136 135 - 145 mmol/L   Potassium 3.5 3.5 - 5.1 mmol/L   Chloride 100 98 - 111 mmol/L   CO2 25 22 - 32 mmol/L   Glucose, Bld 113 (H) 70 - 99 mg/dL   BUN 14 8 - 23 mg/dL   Creatinine, Ser 0.93 0.61 - 1.24 mg/dL   Calcium 7.8 (L) 8.9 - 10.3 mg/dL   Total Protein 5.7 (L) 6.5 - 8.1 g/dL   Albumin 2.4 (L) 3.5 - 5.0 g/dL   AST 15 15 - 41 U/L   ALT 15 0 - 44 U/L   Alkaline Phosphatase 234 (H) 38 - 126 U/L   Total Bilirubin 0.8 0.3 - 1.2 mg/dL   GFR calc non Af Amer >60 >60 mL/min   GFR calc Af Amer >60 >60 mL/min    Comment: (NOTE) The eGFR has been calculated using the CKD EPI equation. This calculation has not been validated in all clinical situations. eGFR's persistently <60 mL/min signify possible Chronic Kidney Disease.    Anion gap 11 5 - 15    Comment: Performed at [facility], [address] [place], [place] [address]  CBC     Status: Abnormal   Collection Time: 10/19/18  5:29 AM  Result Value Ref Range   WBC 14.3 (H) 4.0 - 10.5 K/uL   RBC 2.95 (L) 4.22 - 5.81 MIL/uL   Hemoglobin 8.9 (L) 13.0 - 17.0 g/dL   HCT 28.1 (L) 39.0 - 52.0 %   MCV 95.3 80.0 - 100.0 fL   MCH 30.2 26.0 - 34.0 pg   MCHC 31.7  30.0 - 36.0 g/dL   RDW 15.6 (H) 11.5 - 15.5 %   Platelets 163 150 - 400 K/uL   nRBC 0.0 0.0 - 0.2 %    Comment: Performed at [facility], [address] [place], [place] [address]  Lipase, blood     Status: None   Collection Time: 10/19/18  5:29 AM  Result Value Ref Range   Lipase 26 11 - 51 U/L    Comment: Performed at [facility], [address] [place], [place] [address]  PSA     Status: None   Collection Time: 10/19/18  5:29 AM  Result Value Ref Range   Prostatic Specific Antigen 0.26 0.00 - 4.00 ng/mL    Comment: (NOTE) While PSA levels of <=4.0 ng/ml are reported  as reference range, some men with levels below 4.0 ng/ml can have prostate cancer and many men with PSA above 4.0 ng/ml do not have prostate cancer.  Other tests such as free PSA, age specific reference ranges, PSA velocity and PSA doubling time may be helpful especially in men less than 31 years old. Performed at [facility], [place] [address], [place], [place] [address]   Glucose, capillary     Status: None   Collection Time: 10/19/18  7:21 AM  Result Value Ref Range   Glucose-Capillary 92 70 - 99 mg/dL  Lactate dehydrogenase     Status: Abnormal   Collection Time: 10/19/18  9:09 AM  Result Value Ref Range   LDH 1,239 (H) 98 - 192 U/L    Comment: Performed at [facility], [address] [place], [place] [address]  Uric acid     Status: None   Collection Time: 10/19/18  9:09 AM  Result Value Ref Range   Uric Acid, Serum 5.0 3.7 - 8.6 mg/dL    Comment: Performed at [facility], [address] [place], [place] [address]  Glucose, capillary     Status: Abnormal   Collection Time: 10/19/18 11:47 AM  Result Value Ref Range   Glucose-Capillary 132 (H) 70 - 99 mg/dL   Mr Brain Wo Contrast  Result Date: 10/19/2018 CLINICAL DATA:  Progressive leg weakness, altered mental status. Sepsis. History of stroke, hypertension, hyperlipidemia, diabetes. EXAM: MRI HEAD WITHOUT CONTRAST TECHNIQUE: Multiplanar, multiecho pulse sequences of the brain and surrounding structures were obtained without intravenous contrast. COMPARISON:  CT face October 11, 2018, MRI head March 14, 2011 FINDINGS: INTRACRANIAL CONTENTS: No reduced diffusion to suggest acute ischemia. No susceptibility artifact to suggest hemorrhage. Disproportionate severely atrophic cerebellum similar to progressed. Moderately atrophic pons. No hydrocephalus. LEFT inferior frontal lobe encephalomalacia, given enlargement of LEFT frontal sinuses most compatible with neonatal insult. Old RIGHT thalamus infarct. Old LEFT  parietal infarct with mild ex vacuo dilatation subjacent ventricle. No hydrocephalus. No midline shift, mass effect or masses. No abnormal extra-axial fluid collections. VASCULAR: Normal major intracranial vascular flow voids present at skull base. SKULL AND UPPER CERVICAL SPINE: No abnormal sellar expansion. No suspicious calvarial bone marrow signal. Craniocervical junction maintained. SINUSES/ORBITS: Mild paranasal sinus mucosal thickening without air-fluid levels. Mastoid air cells are well aerated.The included ocular globes and orbital contents are non-suspicious. Status post RIGHT ocular lens implant. OTHER: None. IMPRESSION: 1. No acute intracranial process. 2. 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 3. Old small LEFT parietal/MCA territory infarct. Old RIGHT thalamus infarct. 4. 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 Electronically Signed   By: Thana Farr.D.  On: 10/19/2018 05:13   Mr Thoracic Spine W Wo Contrast  Result Date: 10/19/2018 CLINICAL DATA:  Mid and lower back pain, leg weakness. Sepsis. Evaluate for spinal infection. History of endocarditis, diabetes. EXAM: MRI THORACIC AND LUMBAR SPINE WITHOUT AND WITH CONTRAST TECHNIQUE: Multiplanar and multiecho pulse sequences of the thoracic and lumbar spine were obtained without and with intravenous contrast. CONTRAST:  7 cc Gadavist COMPARISON:  CT abdomen and pelvis October 10, 2018 FINDINGS: MRI THORACIC SPINE FINDINGS ALIGNMENT: Maintenance of the thoracic kyphosis. No malalignment. VERTEBRAE/DISCS: Vertebral bodies are intact. Diffusely heterogeneous bone marrow signal with patchy bright STIR signal in superimposed irregular enhancement of nearly all thoracic vertebral bodies. Preservation of the cortex. Intervertebral disc morphologies and signal maintained. No abnormal disc enhancement. CORD: Thoracic spinal cord is normal morphology and signal  characteristics. No abnormal cord or leptomeningeal enhancement. Epidural enhancement within the included cervical spine. Minimal ventral epidural enhancement versus failure of fat saturation. PREVERTEBRAL AND PARASPINAL SOFT TISSUES:  Nonacute. DISC LEVELS: No disc bulge, canal stenosis nor neural foraminal narrowing. MRI LUMBAR SPINE FINDINGS SEGMENTATION: For the purposes of this report, the last well-formed intervertebral disc is reported as L5-S1. ALIGNMENT: Maintained lumbar lordosis. No malalignment. VERTEBRAE: Vertebral bodies are intact. Diffusely heterogeneous bone marrow signal with patchy bright STIR signal in superimposed irregular enhancement of all lumbar vertebral bodies and included sacrum. Preservation of the cortex. Intervertebral disc morphologies and signal maintained. No abnormal disc enhancement. Congenital canal narrowing on the basis of foreshortened pedicles. CONUS MEDULLARIS AND CAUDA EQUINA: Conus medullaris terminates at L1-2 and demonstrates normal morphology and signal characteristics. Cauda equina is normal. No abnormal cord, leptomeningeal or epidural enhancement. PARASPINAL AND OTHER SOFT TISSUES: Mild prevertebral fat stranding without focal fluid collection or mass. Low-grade paraspinal muscle strain DISC LEVELS: L1-2: Annular bulging enhancing annular fissure. No canal stenosis or neural foraminal narrowing. L2-3: No disc bulge, canal stenosis nor neural foraminal narrowing. L3-4: Annular bulging, small LEFT subarticular and RIGHT extraforaminal disc protrusions. No canal stenosis. Moderate RIGHT and mild LEFT neural foraminal narrowing. L4-5: Moderate broad-based LEFT subarticular disc protrusion. Small broad-based disc bulge, enhancing annular fissures. Mild canal stenosis, narrowed LEFT lateral recess which may affect the traversing LEFT L5 nerve. Mild LEFT neural foraminal narrowing. L5-S1: Small central disc protrusion without canal stenosis. Mild neural foraminal narrowing.  IMPRESSION: 1. Diffusely abnormal bone marrow signal within the thoracolumbar and included sacral spine with heterogeneous enhancement highly concerning for infiltrative tumor (infiltrative metastatic disease versus myeloproliferative disorder or leukemia/lymphoma). No pathologic fracture. Atypical appearance for infection. 2. Faint epidural enhancement, finding seen with venous congestion, less likely infection. 3. No thoracolumbar canal stenosis. Neural foraminal narrowing L3-4 through L5-S1: Moderate on the RIGHT at L3-4. Electronically Signed   By: Elon Alas M.D.   On: 10/19/2018 05:40   Mr Lumbar Spine W Wo Contrast  Result Date: 10/19/2018 CLINICAL DATA:  Mid and lower back pain, leg weakness. Sepsis. Evaluate for spinal infection. History of endocarditis, diabetes. EXAM: MRI THORACIC AND LUMBAR SPINE WITHOUT AND WITH CONTRAST TECHNIQUE: Multiplanar and multiecho pulse sequences of the thoracic and lumbar spine were obtained without and with intravenous contrast. CONTRAST:  7 cc Gadavist COMPARISON:  CT abdomen and pelvis October 10, 2018 FINDINGS: MRI THORACIC SPINE FINDINGS ALIGNMENT: Maintenance of the thoracic kyphosis. No malalignment. VERTEBRAE/DISCS: Vertebral bodies are intact. Diffusely heterogeneous bone marrow signal with patchy bright STIR signal in superimposed irregular enhancement of nearly all thoracic vertebral bodies. Preservation of the cortex. Intervertebral disc morphologies and signal maintained. No  abnormal disc enhancement. CORD: Thoracic spinal cord is normal morphology and signal characteristics. No abnormal cord or leptomeningeal enhancement. Epidural enhancement within the included cervical spine. Minimal ventral epidural enhancement versus failure of fat saturation. PREVERTEBRAL AND PARASPINAL SOFT TISSUES:  Nonacute. DISC LEVELS: No disc bulge, canal stenosis nor neural foraminal narrowing. MRI LUMBAR SPINE FINDINGS SEGMENTATION: For the purposes of this report, the  last well-formed intervertebral disc is reported as L5-S1. ALIGNMENT: Maintained lumbar lordosis. No malalignment. VERTEBRAE: Vertebral bodies are intact. Diffusely heterogeneous bone marrow signal with patchy bright STIR signal in superimposed irregular enhancement of all lumbar vertebral bodies and included sacrum. Preservation of the cortex. Intervertebral disc morphologies and signal maintained. No abnormal disc enhancement. Congenital canal narrowing on the basis of foreshortened pedicles. CONUS MEDULLARIS AND CAUDA EQUINA: Conus medullaris terminates at L1-2 and demonstrates normal morphology and signal characteristics. Cauda equina is normal. No abnormal cord, leptomeningeal or epidural enhancement. PARASPINAL AND OTHER SOFT TISSUES: Mild prevertebral fat stranding without focal fluid collection or mass. Low-grade paraspinal muscle strain DISC LEVELS: L1-2: Annular bulging enhancing annular fissure. No canal stenosis or neural foraminal narrowing. L2-3: No disc bulge, canal stenosis nor neural foraminal narrowing. L3-4: Annular bulging, small LEFT subarticular and RIGHT extraforaminal disc protrusions. No canal stenosis. Moderate RIGHT and mild LEFT neural foraminal narrowing. L4-5: Moderate broad-based LEFT subarticular disc protrusion. Small broad-based disc bulge, enhancing annular fissures. Mild canal stenosis, narrowed LEFT lateral recess which may affect the traversing LEFT L5 nerve. Mild LEFT neural foraminal narrowing. L5-S1: Small central disc protrusion without canal stenosis. Mild neural foraminal narrowing. IMPRESSION: 1. Diffusely abnormal bone marrow signal within the thoracolumbar and included sacral spine with heterogeneous enhancement highly concerning for infiltrative tumor (infiltrative metastatic disease versus myeloproliferative disorder or leukemia/lymphoma). No pathologic fracture. Atypical appearance for infection. 2. Faint epidural enhancement, finding seen with venous congestion,  less likely infection. 3. No thoracolumbar canal stenosis. Neural foraminal narrowing L3-4 through L5-S1: Moderate on the RIGHT at L3-4. Electronically Signed   By: Elon Alas M.D.   On: 10/19/2018 05:40    Assessment:  The patient is a 67 y.o. gentleman with history of fever and leukocytosis with left shift concerning for endocarditis.  TEE on 10/18/2018 revealed a probable vegetation on the aortic valve.  Cultures are negative.  He is on broad spectrum antibiotics.  He has a 1 year history of declining stamina, fatigue, and muscle atrophy in his lower extremities.  Recently this has affected his ability to walk.  CK is normal.  Alkaline phosphatase is elevated.  Abdomen and pelvic CT on 10/10/2018 revealed soft tissue stranding in the retroperitoneal space of the abdomen, diffusely around the pancreas, adrenal glands, and kidneys. These changes were fairly evenly distributed without a definite epicenter making the appearance indeterminate. Changes around the pancreas could be related to pancreatitis and the perirenal edema could reflect pyelonephritis. Bilateral symmetric periadrenal edema/stranding was of uncertain significance.  There was no evidence for discrete, focal abscess in the abdomen/pelvis.   Thoracic and lumbar spine MRI on 10/18/2018 revealed diffusely abnormal bone marrow signal within the thoracolumbar and included sacral spine with heterogeneous enhancement highly concerning for infiltrative tumor.  PSA is 0.26.  Symptomatically, he is fatigued.  Endurance is limited.  He has achiness in his legs.  Exam reveals no adenopathy or hepatosplenomegaly.    Plan:   1.  Abnormal bone marrow signal:  Images personally reviewed with radiology.  Mixed lytic and sclerotic changes.    Etiology unclear, but felt unlikely related  to infection.  Patient has anemia, stable, and mild leukocytosis, improving.  Peripheral smear reveals left shifted WBCs.  Discuss plan for myeloma  evaluation  No palpable adenopathy or adenopathy seen on abdomen/pelvic CT.  Screen for lymphoma (LDH and uric acid).  Chest CT without contrast to assess for adenopathy or any lung lesions.  Discuss plan for bone marrow aspirate and biopsy on Monday, 10/22/2018.  Procedure reviewed in detail with patient. 2.  Anemia and mild leukocytosis:  Suspect chronic disease with possible bone marrow infiltrative process.  Leukocytosis appears to be improving with treatment of infection.  No current evidence of hemolysis.  Check retic, LDH, uric acid, ferritin, iron studies, sed rate, B12, folate.    Myeloma work-up:  SPEP, FLCA, 24 hour UPEP. 3.  Elevated alkaline phosphatase:  Etiology likely secondary to bone related disease.  Fractionate alkaline phosphatase.  Postpone bone scan (per radiology).  4.  Abnormal soft tissue stranding in retroperitoneum:  Etiology unknown.  No obvious mass. 5.   Infectious disease:  Current treatment for endocarditis.  TEE revealed a probable aortic valve vegetation.  Antibiotics changed per infectious disease (cefepime to ceftriaxone; vancomycin and Flagyl discontinued).  Thank you for allowing me to participate in AEDYN KEMPFER 's care.  I will follow him closely with you while hospitalized and after discharge in the outpatient department.   Lequita Asal, MD  10/19/2018, 4:30 PM

## 2018-10-19 NOTE — Progress Notes (Signed)
Patient ID: Richard Gordon, male   DOB: 20-Feb-1951, 67 y.o.   MRN: [id number]   [facility] PROGRESS NOTE  Richard Gordon QQV:[id number] DOB: [birth year] DOA: 10/16/2018 PCP: Leone Haven, MD  HPI/Subjective: Patient feeling a little bit better.  States he is having more luck with using the urinal.  Still little weak in his legs and tired.  Objective: Vitals:   10/19/18 0451 10/19/18 0720  BP: 129/62 139/62  Pulse: (!) 104 100  Resp:    Temp: 98.5 F (36.9 C) 98 F (36.7 C)  SpO2: 96% 98%    Filed Weights   10/16/18 1730 10/18/18 0432 10/19/18 0451  Weight: 69.7 kg 68.5 kg 68.1 kg    ROS: Review of Systems  Constitutional: Positive for malaise/fatigue. Negative for chills and fever.  Eyes: Negative for blurred vision.  Respiratory: Negative for cough and shortness of breath.   Cardiovascular: Negative for chest pain.  Gastrointestinal: Negative for abdominal pain, constipation, diarrhea, nausea and vomiting.  Genitourinary: Negative for dysuria.  Musculoskeletal: Positive for myalgias. Negative for joint pain.  Neurological: Negative for dizziness and headaches.   Exam: Physical Exam  Constitutional: He is oriented to person, place, and time.  HENT:  Nose: No mucosal edema.  Mouth/Throat: No oropharyngeal exudate or posterior oropharyngeal edema.  Poor dentition.  Patient states he had teeth removed and on his left lower jaw.  Eyes: Pupils are equal, round, and reactive to light. Conjunctivae, EOM and lids are normal.  Neck: No JVD present. Carotid bruit is not present. No edema present. No thyroid mass and no thyromegaly present.  Cardiovascular: S1 normal and S2 normal. Exam reveals no gallop.  Murmur heard.  Systolic murmur is present with a grade of 2/6. Pulses:      Dorsalis pedis pulses are 2+ on the right side, and 2+ on the left side.  Respiratory: No respiratory distress. He has no wheezes. He has no rhonchi. He has no rales.  GI: Soft. Bowel sounds  are normal. There is no tenderness.  Musculoskeletal:       Right ankle: He exhibits no swelling.       Left ankle: He exhibits no swelling.  Lymphadenopathy:    He has no cervical adenopathy.  Neurological: He is alert and oriented to person, place, and time. No cranial nerve deficit.  Skin: Skin is warm. No rash noted. Nails show no clubbing.  Psychiatric: He has a normal mood and affect.      Data Reviewed: Basic Metabolic Panel: Recent Labs  Lab 10/14/18 0801 10/16/18 1236 10/16/18 1719 10/17/18 0528 10/19/18 0529  NA 137 137  --  136 136  K 3.2* 3.3*  --  3.3* 3.5  CL 106 101  --  103 100  CO2 23 23  --  23 25  GLUCOSE 109* 193*  --  131* 113*  BUN 16 11  --  13 14  CREATININE 0.72 1.13 1.14 0.98 0.93  CALCIUM 7.5* 8.3*  --  7.6* 7.8*  MG  --   --   --  1.7  --    Liver Function Tests: Recent Labs  Lab 10/16/18 1236 10/19/18 0529  AST 40 15  ALT 39 15  ALKPHOS 237* 234*  BILITOT 1.1 0.8  PROT 6.7 5.7*  ALBUMIN 3.0* 2.4*   CBC: Recent Labs  Lab 10/14/18 0801 10/16/18 1236 10/16/18 1719 10/17/18 0528 10/19/18 0529  WBC 16.7* 31.1* 23.9* 20.4* 14.3*  NEUTROABS  --  18.2*  --   --   --  HGB 9.6* 10.7* 9.2* 8.6* 8.9*  HCT 30.1* 34.1* 29.4* 28.0* 28.1*  MCV 94.4 95.3 95.5 94.9 95.3  PLT 211 259 189 177 163   Cardiac Enzymes: Recent Labs  Lab 10/16/18 1236 10/17/18 0528  CKTOTAL 15* 20*  TROPONINI <0.03  --     CBG: Recent Labs  Lab 10/18/18 1123 10/18/18 1707 10/18/18 2124 10/19/18 0721 10/19/18 1147  GLUCAP 125* 136* 128* 92 132*    Recent Results (from the past 240 hour(s))  Culture, blood (routine x 2)     Status: None (Preliminary result)   Collection Time: 10/16/18  3:23 PM  Result Value Ref Range Status   Specimen Description BLOOD RIGHT ANTECUBITAL  Final   Special Requests   Final    BOTTLES DRAWN AEROBIC AND ANAEROBIC Blood Culture adequate volume   Culture   Final    NO GROWTH 3 DAYS Performed at [facility],  [address], [place], [place] [address]    Report Status PENDING  Incomplete  Culture, blood (routine x 2)     Status: None (Preliminary result)   Collection Time: 10/16/18  3:24 PM  Result Value Ref Range Status   Specimen Description BLOOD BLOOD LEFT HAND  Final   Special Requests   Final    BOTTLES DRAWN AEROBIC AND ANAEROBIC Blood Culture results may not be optimal due to an excessive volume of blood received in culture bottles   Culture   Final    NO GROWTH 3 DAYS Performed at [facility], [address] [place], [place] [address]    Report Status PENDING  Incomplete      Scheduled Meds: . aspirin EC  81 mg Oral Daily  . diltiazem  30 mg Oral Q8H  . docusate sodium  100 mg Oral BID  . enoxaparin (LOVENOX) injection  40 mg Subcutaneous Q24H  . feeding supplement (GLUCERNA SHAKE)  237 mL Oral TID BM  . insulin aspart  0-15 Units Subcutaneous TID WC  . insulin aspart  0-5 Units Subcutaneous QHS  . insulin glargine  20 Units Subcutaneous QHS  . lisinopril  20 mg Oral Daily  . sodium chloride flush  3 mL Intravenous Q12H   Continuous Infusions: . sodium chloride 250 mL (10/19/18 0623)  . cefTRIAXone (ROCEPHIN)  IV 2 g (10/19/18 1547)    Assessment/Plan:  1. Clinical sepsis with culture negative endocarditis leukocytosis and fever.  Dental abscesses.  TEE showing probable aortic valve vegetation.  Blood cultures so far negative. Continue Rocephin and will need 6 weeks total.  Infectious disease consultation appreciated. 2. Infiltrating lesion of the bone on the lumbosacral spine versus metastatic disease.  Consulted oncology to see the patient and make further recommendations.  LDH is high at 1239.  Uric acid is normal range.  PSA added on. 3. Weakness.  Appreciate physical therapy evaluation.  Continue to hold statin and hydrochlorothiazide. 4. Hypokalemia and hypomagnesemia.  Get rid of hydrochlorothiazide.  Replace potassium again today 5. Type 2 diabetes  mellitus on Lantus insulin and sliding scale. 6. Hypertension and tachycardia.  Continue Cardizem CD and continue lisinopril. 7. History of stroke on aspirin.  MRI of the brain shows right frontal lobe encephalomalacia and old infarcts no acute infarcts.  Code Status:     Code Status Orders  (From admission, onward)         Start     Ordered   10/16/18 1713  Full code  Continuous     10/16/18 1712  Code Status History    Date Active Date Inactive Code Status Order ID Comments User Context   10/09/2018 2301 10/14/2018 1535 Full Code [id number]  Demetrios Loll, MD Inpatient     Disposition Plan: Will likely need IV antibiotics and PICC line  Consultants:  Cardiology for TEE  Infectious disease  Oncology  Antibiotics:  Rocephin  Time spent: 28 minutes.  Case discussed with oncology, infectious disease and interventional radiology.  Brae Schaafsma [employer]

## 2018-10-19 NOTE — Plan of Care (Signed)
Patient is running a low grade temperature. No night sweats. Encourage patient to ambulate around the nurses desk. Patient has been refusing 2000 dose of glucerna.

## 2018-10-19 NOTE — Progress Notes (Signed)
Physical Therapy Treatment Patient Details Name: LARRI YEHLE MRN: [id number] DOB: 1951-01-23 Today's Date: 10/19/2018    History of Present Illness Pt is a 67 y/o M who presented with stiff legs, difficulty walking, weakness, fatigue.  Pt admitted for endocarditis.  Recent imaging suggestive of possible neurodegenerative disorder vs hypermetabolic state/metastatic disease.  Pts PMH includes stroke x2, syncope.     PT Comments    Patient with increased truncal instability and UE/LE ataxia (L > R) this date.  Requiring increased assist for bed mobility, sit/stand and gait efforts due to balance impairments and functional strength deficits.  Very high fall risk with upright mobility; unsafe to complete without RW and +1 min/mod assist at this time.  Given change since initial evaluation (and recent imaging reports), do not feel patient safe to return home alone at discharge.  Recommending evaluation for transition to CIR at discharge-patient very motivated to participate/progress and recover functional ability as able.  RNCM informed/aware.   Follow Up Recommendations  CIR     Equipment Recommendations       Recommendations for Other Services       Precautions / Restrictions Precautions Precautions: Fall Precaution Comments: monitor HR Restrictions Weight Bearing Restrictions: No    Mobility  Bed Mobility Overal bed mobility: Needs Assistance Bed Mobility: Supine to Sit     Supine to sit: Min assist     General bed mobility comments: heavy use of LEs for momentum and assist with truncal elevation; generally unsteady thoughout core.  Difficulty obtaining midline initially.  Transfers Overall transfer level: Needs assistance Equipment used: None Transfers: Sit to/from Stand Sit to Stand: Mod assist         General transfer comment: requires significant assist from bilat UEs to assist with lift off from bed surface.  Poor balance with initial transition to upright; mod  assist to prevent posteriro LOB  Ambulation/Gait Ambulation/Gait assistance: Mod assist Gait Distance (Feet): 35 Feet Assistive device: None       General Gait Details: broad BOS with staggered steps, ataxic limb advancement L > R (maintains L LE abduct, excessive weight in heel); guarded trunk posturing, arm position (limited swing.  Very unsteady; poor balance.  High fall risk.   Stairs             Wheelchair Mobility    Modified Rankin (Stroke Patients Only)       Balance Overall balance assessment: Needs assistance Sitting-balance support: No upper extremity supported;Feet supported Sitting balance-Leahy Scale: Fair     Standing balance support: No upper extremity supported Standing balance-Leahy Scale: Zero                              Cognition Arousal/Alertness: Awake/alert Behavior During Therapy: WFL for tasks assessed/performed Overall Cognitive Status: Within Functional Limits for tasks assessed                                        Exercises Other Exercises Other Exercises: Educated in sequence/technique for bed mobility; with training, able to complete with close sup.  Noted improvement in technique and functional performance. Other Exercises: Sit/stand without assist device, mod assist; sit/stand with UE assist, min/mod assist; sit/stand with RW, min assist.  Decreased strength/power in bilat LEs; poor ability to achieve midline, maintain balance in A/P plane with unsupported standing. Other Exercises: Gait x30'  wiht RW, min/mod assist-improved balance, but continued difficulty with limb advance/placement and overall dynamic balance    General Comments        Pertinent Vitals/Pain Pain Assessment: No/denies pain    Home Living                      Prior Function            PT Goals (current goals can now be found in the care plan section) Acute Rehab PT Goals Patient Stated Goal: to return home PT  Goal Formulation: With patient Time For Goal Achievement: 10/31/18 Potential to Achieve Goals: Good Progress towards PT goals: Progressing toward goals    Frequency    Min 2X/week      PT Plan Discharge plan needs to be updated    Co-evaluation              AM-PAC PT "6 Clicks" Daily Activity  Outcome Measure  Difficulty turning over in bed (including adjusting bedclothes, sheets and blankets)?: A Little Difficulty moving from lying on back to sitting on the side of the bed? : A Little Difficulty sitting down on and standing up from a chair with arms (e.g., wheelchair, bedside commode, etc,.)?: Unable Help needed moving to and from a bed to chair (including a wheelchair)?: A Lot Help needed walking in hospital room?: A Lot Help needed climbing 3-5 steps with a railing? : Total 6 Click Score: 12    End of Session Equipment Utilized During Treatment: Gait belt Activity Tolerance: Patient tolerated treatment well Patient left: in bed;with bed alarm set;with call bell/phone within reach   PT Visit Diagnosis: Unsteadiness on feet (R26.81);Other abnormalities of gait and mobility (R26.89);Muscle weakness (generalized) (M62.81);Other symptoms and signs involving the nervous system (R29.898);Difficulty in walking, not elsewhere classified (R26.2)     Time: 4540-9811 PT Time Calculation (min) (ACUTE ONLY): 30 min  Charges:  $Gait Training: 8-22 mins $Therapeutic Activity: 8-22 mins                     Manraj Yeo H. Owens Shark, PT, DPT, NCS 10/20/18, 8:03 AM [phone]

## 2018-10-19 NOTE — Progress Notes (Signed)
? Richard Gordon is a 67 y.o. male with a history of diabetes mellitus, hypertension, hyperlipidemia, CVA, CKD  a very vague historian has been back and forth to the hospital  l for the past few weeks with c/o not feeling well, generalized body ache, lower leg weakness and afraid that he would fall. He lives on his own  Initially he said it all started after he broke his tooth 4-5 weeks ago and then had a tooth extraction done a week ago and ever since has not felt well.  He has been on amoxicillin since 10/03/2018.  But a few minutes later he said he has not been well right from the time the tooth broke.  He never had pain at the tooth.  His main complaint this is not been able to walk very well because he feels weak in his legs.  He has poor appetite.  He says he has lost weight.  He has had some sweating.  He does not really complain of any fever.  In September he had gone to his PCP and was complaining of low back pain left leg pain which apparently is chronic but it had gotten worse at that time. Just before that he was seen in the ED on September 19 with chief complaints of generalized body aches feeling sore all over.  So not  really clear how long he  has been unwell.  He lives on his own.  He said before he walked well but of late has been walking using a cane and then a walker. Did not initially  complain of any retention of urine, difficulty passing urine or dysuria.  Does not have any diarrhea or incontinence of stool.   has some nausea and poor appetite.  He has no travel history.  He does not have any pets.  He does not smoke nor drink alcohol.   He came to the ED on 10/07/2018 and was discharged home after getting IV fluids in the hospital.  On 10/07/2018 his WBC count was 15.3.    Came back on 10/09/2018 complaining of body aches for 2 months.  Even though admission his temperature was 98.5 it did go up to 103 within a few hours.  He had a WBC count of 27.  He had increased lactate.  Blood  cultures were sent and he was admitted as sepsis and started on IV Vanco cefepime and Flagyl.  He had a chest x-ray on admission which did not show any cardiopulmonary disease.  The next day had a CT abdomen and pelvis was done and it showed Soft tissue stranding is identified in the retroperitoneal space of the abdomen, diffusely around the pancreas, adrenal glands, and kidneys. These changes are fairly evenly distributed without a definite epicenter making the appearance indeterminate. Changes around the pancreas could be related to pancreatitis and the perirenal edema could reflect pyelonephritis. Bilateral symmetric periadrenal edema/stranding is of uncertain significance.   No history of travel .  No activity like hunting or fishing or water sports.  No animal bites, tick bites or other insect bites  I saw him during that admission and was concerned for endocarditis or a vertebral infection and asked for echo and TEE. Blood culture was negative But pt over the weekend was discharged home (10/14/18)  ( he asked to go ) cefdinir and flagyl. He returned within 48 hrs on 10/16/18 with stiffness legs and pain legs while walking. He drove himself to the ED .His WBC was  31 and he had low grade fever  He says he has been having low back pain and said it was due to drinking mountain Dew. TEE was done yesterday  and the report is probable aortic valve vegetation with no measurement given Noted to be peeing on himself   I?   Abtx:  Anti-infectives (From admission, onward)   Start     Dose/Rate Route Frequency Ordered Stop   10/18/18 1600  cefTRIAXone (ROCEPHIN) 2 g in sodium chloride 0.9 % 100 mL IVPB     2 g 200 mL/hr over 30 Minutes Intravenous Every 24 hours 10/18/18 1441     10/17/18 0000  metroNIDAZOLE (FLAGYL) IVPB 500 mg  Status:  Discontinued     500 mg 100 mL/hr over 60 Minutes Intravenous Every 8 hours 10/16/18 1712 10/16/18 1959   10/17/18 0000  ceFEPIme (MAXIPIME) 2 g in sodium  chloride 0.9 % 100 mL IVPB  Status:  Discontinued     2 g 200 mL/hr over 30 Minutes Intravenous Every 8 hours 10/16/18 1622 10/18/18 1441   10/17/18 0000  vancomycin (VANCOCIN) IVPB 1000 mg/200 mL premix  Status:  Discontinued     1,000 mg 200 mL/hr over 60 Minutes Intravenous Every 12 hours 10/16/18 1622 10/19/18 1110   10/16/18 1715  ceFEPIme (MAXIPIME) 2 g in sodium chloride 0.9 % 100 mL IVPB  Status:  Discontinued     2 g 200 mL/hr over 30 Minutes Intravenous  Once 10/16/18 1712 10/16/18 1800   10/16/18 1715  vancomycin (VANCOCIN) IVPB 1000 mg/200 mL premix  Status:  Discontinued     1,000 mg 200 mL/hr over 60 Minutes Intravenous  Once 10/16/18 1712 10/16/18 1831   10/16/18 1515  ceFEPIme (MAXIPIME) 2 g in sodium chloride 0.9 % 100 mL IVPB     2 g 200 mL/hr over 30 Minutes Intravenous  Once 10/16/18 1514 10/16/18 1732   10/16/18 1515  metroNIDAZOLE (FLAGYL) IVPB 500 mg     500 mg 100 mL/hr over 60 Minutes Intravenous Every 8 hours 10/16/18 1514     10/16/18 1515  vancomycin (VANCOCIN) IVPB 1000 mg/200 mL premix     1,000 mg 200 mL/hr over 60 Minutes Intravenous  Once 10/16/18 1514 10/16/18 1901      REVIEW OF SYSTEMS:  Const: negative fever, negative chills, some weight loss Eyes: negative diplopia or visual changes, negative eye pain ENT: negative coryza, negative sore throat Resp: negative cough, hemoptysis, dyspnea Cards: negative for chest pain, palpitations, lower extremity edema GU: negative for frequency, dysuria and hematuria GI: Negative for abdominal pain, diarrhea, bleeding, constipation Skin: negative for rash and pruritus Heme: negative for easy bruising and gum/nose bleeding MS: myalgias, , back pain and muscle weakness, leg pain Neurolo:negative for headaches, dizziness, vertigo, memory problems  Psych: negative for feelings of anxiety, depression  Endocrine: no polyuria or polydipsia Allergy/Immunology- negative for any medication or food  allergies  Objective:  VITALS:  BP 139/62 (BP Location: Right Arm)   Pulse 100   Temp 98 F (36.7 C) (Oral)   Resp 15   Ht 5' 10" (1.778 m)   Wt 68.1 kg   SpO2 98%   BMI 21.54 kg/m  PHYSICAL EXAM:  General: Alert, cooperative, no distress, appears stated age. pale Head: Normocephalic, without obvious abnormality, atraumatic. Eyes: Conjunctivae clear, anicteric sclerae. Pupils are equal ENT Nares normal. No drainage or sinus tenderness. Lips, mucosa, and tongue normal. No Thrush Neck: Supple, symmetrical, no adenopathy, thyroid: non tender no carotid bruit  and no JVD. Back: No CVA tenderness. Lungs: Clear to auscultation bilaterally. No Wheezing or Rhonchi. No rales. Heart: Regular rate and rhythm, no murmur, rub or gallop. Abdomen: Soft, non-tender,not distended. Bowel sounds normal. No masses Extremities: atraumatic, no cyanosis. No edema. No clubbing Skin: No rashes or lesions. Or bruising Lymph: Cervical, supraclavicular normal. Neurologic: Grossly non-focal Pertinent Labs Lab Results CBC    Component Value Date/Time   WBC 14.3 (H) 10/19/2018 0529   RBC 2.95 (L) 10/19/2018 0529   HGB 8.9 (L) 10/19/2018 0529   HGB 14.6 07/10/2013 2023   HCT 28.1 (L) 10/19/2018 0529   HCT 40.2 07/10/2013 2023   PLT 163 10/19/2018 0529   PLT 241 07/10/2013 2023   MCV 95.3 10/19/2018 0529   MCV 93 07/10/2013 2023   MCH 30.2 10/19/2018 0529   MCHC 31.7 10/19/2018 0529   RDW 15.6 (H) 10/19/2018 0529   RDW 12.9 07/10/2013 2023   LYMPHSABS 1.9 10/16/2018 1236   MONOABS 3.6 (H) 10/16/2018 1236   EOSABS 0.0 10/16/2018 1236   BASOSABS 0.2 (H) 10/16/2018 1236   CBC Latest Ref Rng & Units 10/19/2018 10/17/2018 10/16/2018  WBC 4.0 - 10.5 K/uL 14.3(H) 20.4(H) 23.9(H)  Hemoglobin 13.0 - 17.0 g/dL 8.9(L) 8.6(L) 9.2(L)  Hematocrit 39.0 - 52.0 % 28.1(L) 28.0(L) 29.4(L)  Platelets 150 - 400 K/uL 163 177 189   CMP Latest Ref Rng & Units 10/19/2018 10/17/2018 10/16/2018  Glucose 70 - 99  mg/dL 113(H) 131(H) -  BUN 8 - 23 mg/dL 14 13 -  Creatinine 0.61 - 1.24 mg/dL 0.93 0.98 1.14  Sodium 135 - 145 mmol/L 136 136 -  Potassium 3.5 - 5.1 mmol/L 3.5 3.3(L) -  Chloride 98 - 111 mmol/L 100 103 -  CO2 22 - 32 mmol/L 25 23 -  Calcium 8.9 - 10.3 mg/dL 7.8(L) 7.6(L) -  Total Protein 6.5 - 8.1 g/dL 5.7(L) - -  Total Bilirubin 0.3 - 1.2 mg/dL 0.8 - -  Alkaline Phos 38 - 126 U/L 234(H) - -  AST 15 - 41 U/L 15 - -  ALT 0 - 44 U/L 15 - -      Microbiology: Recent Results (from the past 240 hour(s))  Blood culture (routine x 2)     Status: None   Collection Time: 10/09/18 11:24 AM  Result Value Ref Range Status   Specimen Description BLOOD LEFT ANTECUBITAL  Final   Special Requests Blood Culture adequate volume  Final   Culture   Final    NO GROWTH 5 DAYS Performed at [facility], [address], [place], [place] [address]    Report Status 10/14/2018 FINAL  Final  Urine culture     Status: None   Collection Time: 10/09/18 11:24 AM  Result Value Ref Range Status   Specimen Description   Final    URINE, RANDOM Performed at [facility], [address], [place], [place] [address]    Special Requests   Final    NONE Performed at [facility], [address], [place], [place] [address]    Culture   Final    NO GROWTH Performed at [facility], [place] [address] [place], [place] [address]    Report Status 10/10/2018 FINAL  Final  Culture, blood (routine x 2)     Status: None (Preliminary result)   Collection Time: 10/16/18  3:23 PM  Result Value Ref Range Status   Specimen Description BLOOD RIGHT ANTECUBITAL  Final   Special Requests  Final    BOTTLES DRAWN AEROBIC AND ANAEROBIC Blood Culture adequate volume   Culture   Final    NO GROWTH 3 DAYS Performed at [facility], [address] [place], [place] [address]    Report Status PENDING  Incomplete  Culture, blood (routine x 2)     Status: None (Preliminary result)    Collection Time: 10/16/18  3:24 PM  Result Value Ref Range Status   Specimen Description BLOOD BLOOD LEFT HAND  Final   Special Requests   Final    BOTTLES DRAWN AEROBIC AND ANAEROBIC Blood Culture results may not be optimal due to an excessive volume of blood received in culture bottles   Culture   Final    NO GROWTH 3 DAYS Performed at [facility], [address] [place], [place] [address]    Report Status PENDING  Incomplete   IMAGING RESULTS: ? Impression/Recommendation ?67 y.o. male with a history of diabetes mellitus, hypertension, hyperlipidemia presents with not feeling well for the past few weeks. Initially he said it all started after he broke his tooth 4-5 weeks ago and then had a tooth extraction done a week ago and ever since has not felt well.  He has been on antibiotics after the tooth extraction he is taking amoxicillin since 10/03/2018.  But a few minutes later he said he has not been well right from the time the tooth broke.  He never had pain at the tooth.  His main complaint this is not been able to walk very well because he feels weak in his legs.  He has poor appetite.  He says he has lost weight.  He has had some sweating.  He does not really complain of any fever.  In September he had gone to his PCP and was complaining of low back pain left leg pain which apparently is chronic but it had gotten worse at that time. Just before that he was seen in the ED on September 19 with chief complaints of generalized body aches feeling sore all over.  So not  really clear how long he  has been unwell.  He lives on his own.  He said before he walked well but of late has been walking using a cane and then a walker.  Fever and leukocytosis.  Along with fatigue and weakness for few weeks, anemia.  History of tooth issue. Aortic valve endocarditis on TEE.   Blood culture has been negative so far -- because he was on  amoxicillin before he presented this could be negative from  the antibiotic effect. Ut likely organisms from oral cavity- strep group- hence ceftriaxone should be fine He did have CT of the maxilla and that has shown multiple periapical abscesses but I am not sure whether that is going to cause this high white count.  There was no obvious collection which was noted in the maxillary scan..    The CT abdomen and pelvis showed diffuse soft tissue stranding in the retroperitoneal space of the abdomen around the pancreas adrenal glands and kidneys.  Unclear what this process is ?  Marland Kitchen Because of complains of weakness in his legs and inability to walk he got MRI of the Thoraco/lumbar spine . It shows no infection but  Diffusely abnormal bone marrow signal within the thoracolumbar and included sacral spine with heterogeneous enhancement highly concerning for infiltrative tumor (infiltrative metastatic disease versus myeloproliferative disorder or leukemia/lymphoma). No pathologic fracture. Atypical appearance for infection.  Anemia since November 2019. .?  Anemia secondary to infection versus other cause.  Increase in alkaline phophatase-   ? Bone   ?  CKD- much improved  Diabetes mellitus   Need post void bladder scanto look for residue  Hypertension management as per primary team. ? ? ___________________________________________________ Discussed with patient,and Dr.Wieting ID will follow him peripherally this weekend

## 2018-10-19 NOTE — Care Management Important Message (Signed)
Copy of signed IM left with patient in room.

## 2018-10-20 ENCOUNTER — Inpatient Hospital Stay: Payer: Self-pay

## 2018-10-20 ENCOUNTER — Inpatient Hospital Stay: Payer: Medicare HMO

## 2018-10-20 DIAGNOSIS — I330 Acute and subacute infective endocarditis: Secondary | ICD-10-CM

## 2018-10-20 DIAGNOSIS — M899 Disorder of bone, unspecified: Secondary | ICD-10-CM

## 2018-10-20 DIAGNOSIS — B9689 Other specified bacterial agents as the cause of diseases classified elsewhere: Secondary | ICD-10-CM

## 2018-10-20 LAB — RETICULOCYTES
Immature Retic Fract: 32.3 % — ABNORMAL HIGH (ref 2.3–15.9)
RBC.: 3 MIL/uL — ABNORMAL LOW (ref 4.22–5.81)
Retic Count, Absolute: 105.3 10*3/uL (ref 19.0–186.0)
Retic Ct Pct: 3.5 % — ABNORMAL HIGH (ref 0.4–3.1)

## 2018-10-20 LAB — GLUCOSE, CAPILLARY
GLUCOSE-CAPILLARY: 141 mg/dL — AB (ref 70–99)
GLUCOSE-CAPILLARY: 130 mg/dL — AB (ref 70–99)
Glucose-Capillary: 81 mg/dL (ref 70–99)
Glucose-Capillary: 83 mg/dL (ref 70–99)
Glucose-Capillary: 130 mg/dL — ABNORMAL HIGH (ref 70–99)

## 2018-10-20 LAB — CBC WITH DIFFERENTIAL/PLATELET
Abs Immature Granulocytes: 1.4 10*3/uL — ABNORMAL HIGH (ref 0.00–0.07)
Basophils Absolute: 0.1 10*3/uL (ref 0.0–0.1)
Basophils Relative: 0 %
Eosinophils Absolute: 0 10*3/uL (ref 0.0–0.5)
Eosinophils Relative: 0 %
HCT: 28 % — ABNORMAL LOW (ref 39.0–52.0)
Hemoglobin: 8.9 g/dL — ABNORMAL LOW (ref 13.0–17.0)
Immature Granulocytes: 9 %
Lymphocytes Relative: 12 %
Lymphs Abs: 1.8 10*3/uL (ref 0.7–4.0)
MCH: 29.6 pg (ref 26.0–34.0)
MCHC: 31.8 g/dL (ref 30.0–36.0)
MCV: 93 fL (ref 80.0–100.0)
Monocytes Absolute: 1.5 10*3/uL — ABNORMAL HIGH (ref 0.1–1.0)
Monocytes Relative: 10 %
Neutro Abs: 10.1 10*3/uL — ABNORMAL HIGH (ref 1.7–7.7)
Neutrophils Relative %: 69 %
Platelets: 173 10*3/uL (ref 150–400)
RBC: 3.01 MIL/uL — ABNORMAL LOW (ref 4.22–5.81)
RDW: 15.6 % — ABNORMAL HIGH (ref 11.5–15.5)
Smear Review: NORMAL
WBC: 14.9 10*3/uL — ABNORMAL HIGH (ref 4.0–10.5)
nRBC: 0.2 % (ref 0.0–0.2)

## 2018-10-20 LAB — COMPREHENSIVE METABOLIC PANEL
ALT: 14 U/L (ref 0–44)
AST: 16 U/L (ref 15–41)
Albumin: 2.4 g/dL — ABNORMAL LOW (ref 3.5–5.0)
Alkaline Phosphatase: 216 U/L — ABNORMAL HIGH (ref 38–126)
Anion gap: 8 (ref 5–15)
BUN: 15 mg/dL (ref 8–23)
CALCIUM: 8 mg/dL — AB (ref 8.9–10.3)
CO2: 27 mmol/L (ref 22–32)
CREATININE: 0.96 mg/dL (ref 0.61–1.24)
Chloride: 101 mmol/L (ref 98–111)
Glucose, Bld: 79 mg/dL (ref 70–99)
Potassium: 3.2 mmol/L — ABNORMAL LOW (ref 3.5–5.1)
SODIUM: 136 mmol/L (ref 135–145)
Total Bilirubin: 0.8 mg/dL (ref 0.3–1.2)
Total Protein: 5.7 g/dL — ABNORMAL LOW (ref 6.5–8.1)

## 2018-10-20 LAB — IRON AND TIBC
Iron: 42 ug/dL — ABNORMAL LOW (ref 45–182)
Saturation Ratios: 33 % (ref 17.9–39.5)
TIBC: 127 ug/dL — ABNORMAL LOW (ref 250–450)
UIBC: 85 ug/dL

## 2018-10-20 LAB — SEDIMENTATION RATE: Sed Rate: 96 mm/hr — ABNORMAL HIGH (ref 0–20)

## 2018-10-20 LAB — FERRITIN: Ferritin: 1789 ng/mL — ABNORMAL HIGH (ref 24–336)

## 2018-10-20 MED ORDER — POTASSIUM CHLORIDE CRYS ER 20 MEQ PO TBCR
40.0000 meq | EXTENDED_RELEASE_TABLET | Freq: Once | ORAL | Status: AC
  Administered 2018-10-20: 40 meq via ORAL
  Filled 2018-10-20: qty 2

## 2018-10-20 MED ORDER — GUAIFENESIN-DM 100-10 MG/5ML PO SYRP
5.0000 mL | ORAL_SOLUTION | ORAL | Status: DC | PRN
  Administered 2018-10-20 (×2): 5 mL via ORAL
  Filled 2018-10-20 (×2): qty 5

## 2018-10-20 NOTE — Progress Notes (Signed)
[address] [facility] [place] NAME: Richard Gordon    MR#:  [id number]  DATE OF BIRTH:  06/15/1951  SUBJECTIVE:  CHIEF COMPLAINT:   Chief Complaint  Patient presents with  . Muscle Pain  Patient seen and evaluated today Tolerated occupational therapy well Decreased muscle aches No nausea or vomiting  REVIEW OF SYSTEMS:    ROS  CONSTITUTIONAL: No documented fever.  Has fatigue, weakness. No weight gain, no weight loss.  EYES: No blurry or double vision.  ENT: No tinnitus. No postnasal drip. No redness of the oropharynx.  RESPIRATORY: No cough, no wheeze, no hemoptysis. No dyspnea.  CARDIOVASCULAR: No chest pain. No orthopnea. No palpitations. No syncope.  GASTROINTESTINAL: No nausea, no vomiting or diarrhea. No abdominal pain. No melena or hematochezia.  GENITOURINARY: No dysuria or hematuria.  ENDOCRINE: No polyuria or nocturia. No heat or cold intolerance.  HEMATOLOGY: No anemia. No bruising. No bleeding.  INTEGUMENTARY: No rashes. No lesions.  MUSCULOSKELETAL: No arthritis. No swelling. No gout.  NEUROLOGIC: No numbness, tingling, or ataxia. No seizure-type activity.  PSYCHIATRIC: No anxiety. No insomnia. No ADD.   DRUG ALLERGIES:  No Known Allergies  VITALS:  Blood pressure (!) 143/58, pulse 94, temperature 98.5 F (36.9 C), temperature source Oral, resp. rate 18, height 5' 10" (1.778 m), weight 68.1 kg, SpO2 93 %.  PHYSICAL EXAMINATION:   Physical Exam  GENERAL:  67 y.o.-year-old patient lying in the bed with no acute distress.  EYES: Pupils equal, round, reactive to light and accommodation. No scleral icterus. Extraocular muscles intact.  HEENT: Head atraumatic, normocephalic. Oropharynx and nasopharynx clear.  NECK:  Supple, no jugular venous distention. No thyroid enlargement, no tenderness.  LUNGS: Normal breath sounds bilaterally, no wheezing, rales, rhonchi. No use of accessory muscles of respiration.  CARDIOVASCULAR: S1, S2  normal. No murmurs, rubs, or gallops.  ABDOMEN: Soft, nontender, nondistended. Bowel sounds present. No organomegaly or mass.  EXTREMITIES: No cyanosis, clubbing or edema b/l.    NEUROLOGIC: Cranial nerves II through XII are intact. No focal Motor or sensory deficits b/l.   PSYCHIATRIC: The patient is alert and oriented x 3.  SKIN: No obvious rash, lesion, or ulcer.   LABORATORY PANEL:   CBC Recent Labs  Lab 10/20/18 0538  WBC 14.9*  HGB 8.9*  HCT 28.0*  PLT 173   ------------------------------------------------------------------------------------------------------------------ Chemistries  Recent Labs  Lab 10/17/18 0528  10/20/18 0538  NA 136   < > 136  K 3.3*   < > 3.2*  CL 103   < > 101  CO2 23   < > 27  GLUCOSE 131*   < > 79  BUN 13   < > 15  CREATININE 0.98   < > 0.96  CALCIUM 7.6*   < > 8.0*  MG 1.7  --   --   AST  --    < > 16  ALT  --    < > 14  ALKPHOS  --    < > 216*  BILITOT  --    < > 0.8   < > = values in this interval not displayed.   ------------------------------------------------------------------------------------------------------------------  Cardiac Enzymes Recent Labs  Lab 10/16/18 1236  TROPONINI <0.03   ------------------------------------------------------------------------------------------------------------------  RADIOLOGY:  Ct Chest Wo Contrast  Result Date: 10/20/2018 CLINICAL DATA:  Cough, fever. Possible endocarditis on TEE. Abnormal marrow signal on thoracolumbar spine MRI raises concern for malignancy. EXAM: CT CHEST WITHOUT CONTRAST TECHNIQUE: Multidetector CT imaging of  the chest was performed following the standard protocol without IV contrast. COMPARISON:  Correlation with thoracolumbar spine MRI dated 10/19/2018 and recent CT abdomen/pelvis dated 10/10/2018. FINDINGS: Cardiovascular: The heart is normal in size. No pericardial effusion. No evidence of thoracic aortic aneurysm. Atherosclerotic calcifications of the aortic arch.  Three vessel coronary atherosclerosis. Mediastinum/Nodes: No suspicious mediastinal or axillary lymphadenopathy. 7 mm short axis azygoesophageal recess node (series 2/image 80), within normal limits, previously 9 mm. Visualized thyroid is unremarkable. Lungs/Pleura: Lungs are essentially clear. Mild dependent atelectasis in the bilateral lower lobes. No focal consolidation. No pleural effusion or pneumothorax. Upper Abdomen: Visualized upper abdomen is notable for mild stranding along the bilateral adrenal glands and kidneys, possibly improved from recent CT. Musculoskeletal: Diffuse/heterogeneous osseous sclerosis involving the visualized axial and appendicular skeleton including the thoracolumbar spine and sternum. IMPRESSION: Diffuse/heterogeneous osseous sclerosis involving the visualized axial and appendicular skeleton. Differential considerations include renal osteodystrophy, diffuse osseous metastases secondary to malignancy (most commonly prostate cancer), or infiltrative disorders including lymphoma. Otherwise, no findings suspicious for malignancy in the chest. Aortic Atherosclerosis (ICD10-I70.0). Electronically Signed   By: Julian Hy M.D.   On: 10/20/2018 10:15   Mr Brain Wo Contrast  Result Date: 10/19/2018 CLINICAL DATA:  Progressive leg weakness, altered mental status. Sepsis. History of stroke, hypertension, hyperlipidemia, diabetes. EXAM: MRI HEAD WITHOUT CONTRAST TECHNIQUE: Multiplanar, multiecho pulse sequences of the brain and surrounding structures were obtained without intravenous contrast. COMPARISON:  CT face October 11, 2018, MRI head March 14, 2011 FINDINGS: INTRACRANIAL CONTENTS: No reduced diffusion to suggest acute ischemia. No susceptibility artifact to suggest hemorrhage. Disproportionate severely atrophic cerebellum similar to progressed. Moderately atrophic pons. No hydrocephalus. LEFT inferior frontal lobe encephalomalacia, given enlargement of LEFT frontal sinuses most  compatible with neonatal insult. Old RIGHT thalamus infarct. Old LEFT parietal infarct with mild ex vacuo dilatation subjacent ventricle. No hydrocephalus. No midline shift, mass effect or masses. No abnormal extra-axial fluid collections. VASCULAR: Normal major intracranial vascular flow voids present at skull base. SKULL AND UPPER CERVICAL SPINE: No abnormal sellar expansion. No suspicious calvarial bone marrow signal. Craniocervical junction maintained. SINUSES/ORBITS: Mild paranasal sinus mucosal thickening without air-fluid levels. Mastoid air cells are well aerated.The included ocular globes and orbital contents are non-suspicious. Status post RIGHT ocular lens implant. OTHER: None. IMPRESSION: 1. No acute intracranial process. 2. 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 3. Old small LEFT parietal/MCA territory infarct. Old RIGHT thalamus infarct. 4. 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 Electronically Signed   By: Elon Alas M.D.   On: 10/19/2018 05:13   Mr Thoracic Spine W Wo Contrast  Result Date: 10/19/2018 CLINICAL DATA:  Mid and lower back pain, leg weakness. Sepsis. Evaluate for spinal infection. History of endocarditis, diabetes. EXAM: MRI THORACIC AND LUMBAR SPINE WITHOUT AND WITH CONTRAST TECHNIQUE: Multiplanar and multiecho pulse sequences of the thoracic and lumbar spine were obtained without and with intravenous contrast. CONTRAST:  7 cc Gadavist COMPARISON:  CT abdomen and pelvis October 10, 2018 FINDINGS: MRI THORACIC SPINE FINDINGS ALIGNMENT: Maintenance of the thoracic kyphosis. No malalignment. VERTEBRAE/DISCS: Vertebral bodies are intact. Diffusely heterogeneous bone marrow signal with patchy bright STIR signal in superimposed irregular enhancement of nearly all thoracic vertebral bodies. Preservation of the cortex. Intervertebral disc morphologies and signal maintained. No abnormal disc  enhancement. CORD: Thoracic spinal cord is normal morphology and signal characteristics. No abnormal cord or leptomeningeal enhancement. Epidural enhancement within the included cervical spine. Minimal ventral epidural enhancement versus failure of fat  saturation. PREVERTEBRAL AND PARASPINAL SOFT TISSUES:  Nonacute. DISC LEVELS: No disc bulge, canal stenosis nor neural foraminal narrowing. MRI LUMBAR SPINE FINDINGS SEGMENTATION: For the purposes of this report, the last well-formed intervertebral disc is reported as L5-S1. ALIGNMENT: Maintained lumbar lordosis. No malalignment. VERTEBRAE: Vertebral bodies are intact. Diffusely heterogeneous bone marrow signal with patchy bright STIR signal in superimposed irregular enhancement of all lumbar vertebral bodies and included sacrum. Preservation of the cortex. Intervertebral disc morphologies and signal maintained. No abnormal disc enhancement. Congenital canal narrowing on the basis of foreshortened pedicles. CONUS MEDULLARIS AND CAUDA EQUINA: Conus medullaris terminates at L1-2 and demonstrates normal morphology and signal characteristics. Cauda equina is normal. No abnormal cord, leptomeningeal or epidural enhancement. PARASPINAL AND OTHER SOFT TISSUES: Mild prevertebral fat stranding without focal fluid collection or mass. Low-grade paraspinal muscle strain DISC LEVELS: L1-2: Annular bulging enhancing annular fissure. No canal stenosis or neural foraminal narrowing. L2-3: No disc bulge, canal stenosis nor neural foraminal narrowing. L3-4: Annular bulging, small LEFT subarticular and RIGHT extraforaminal disc protrusions. No canal stenosis. Moderate RIGHT and mild LEFT neural foraminal narrowing. L4-5: Moderate broad-based LEFT subarticular disc protrusion. Small broad-based disc bulge, enhancing annular fissures. Mild canal stenosis, narrowed LEFT lateral recess which may affect the traversing LEFT L5 nerve. Mild LEFT neural foraminal narrowing. L5-S1: Small central  disc protrusion without canal stenosis. Mild neural foraminal narrowing. IMPRESSION: 1. Diffusely abnormal bone marrow signal within the thoracolumbar and included sacral spine with heterogeneous enhancement highly concerning for infiltrative tumor (infiltrative metastatic disease versus myeloproliferative disorder or leukemia/lymphoma). No pathologic fracture. Atypical appearance for infection. 2. Faint epidural enhancement, finding seen with venous congestion, less likely infection. 3. No thoracolumbar canal stenosis. Neural foraminal narrowing L3-4 through L5-S1: Moderate on the RIGHT at L3-4. Electronically Signed   By: Elon Alas M.D.   On: 10/19/2018 05:40   Mr Lumbar Spine W Wo Contrast  Result Date: 10/19/2018 CLINICAL DATA:  Mid and lower back pain, leg weakness. Sepsis. Evaluate for spinal infection. History of endocarditis, diabetes. EXAM: MRI THORACIC AND LUMBAR SPINE WITHOUT AND WITH CONTRAST TECHNIQUE: Multiplanar and multiecho pulse sequences of the thoracic and lumbar spine were obtained without and with intravenous contrast. CONTRAST:  7 cc Gadavist COMPARISON:  CT abdomen and pelvis October 10, 2018 FINDINGS: MRI THORACIC SPINE FINDINGS ALIGNMENT: Maintenance of the thoracic kyphosis. No malalignment. VERTEBRAE/DISCS: Vertebral bodies are intact. Diffusely heterogeneous bone marrow signal with patchy bright STIR signal in superimposed irregular enhancement of nearly all thoracic vertebral bodies. Preservation of the cortex. Intervertebral disc morphologies and signal maintained. No abnormal disc enhancement. CORD: Thoracic spinal cord is normal morphology and signal characteristics. No abnormal cord or leptomeningeal enhancement. Epidural enhancement within the included cervical spine. Minimal ventral epidural enhancement versus failure of fat saturation. PREVERTEBRAL AND PARASPINAL SOFT TISSUES:  Nonacute. DISC LEVELS: No disc bulge, canal stenosis nor neural foraminal narrowing. MRI  LUMBAR SPINE FINDINGS SEGMENTATION: For the purposes of this report, the last well-formed intervertebral disc is reported as L5-S1. ALIGNMENT: Maintained lumbar lordosis. No malalignment. VERTEBRAE: Vertebral bodies are intact. Diffusely heterogeneous bone marrow signal with patchy bright STIR signal in superimposed irregular enhancement of all lumbar vertebral bodies and included sacrum. Preservation of the cortex. Intervertebral disc morphologies and signal maintained. No abnormal disc enhancement. Congenital canal narrowing on the basis of foreshortened pedicles. CONUS MEDULLARIS AND CAUDA EQUINA: Conus medullaris terminates at L1-2 and demonstrates normal morphology and signal characteristics. Cauda equina is normal. No abnormal cord, leptomeningeal or epidural enhancement. PARASPINAL AND OTHER  SOFT TISSUES: Mild prevertebral fat stranding without focal fluid collection or mass. Low-grade paraspinal muscle strain DISC LEVELS: L1-2: Annular bulging enhancing annular fissure. No canal stenosis or neural foraminal narrowing. L2-3: No disc bulge, canal stenosis nor neural foraminal narrowing. L3-4: Annular bulging, small LEFT subarticular and RIGHT extraforaminal disc protrusions. No canal stenosis. Moderate RIGHT and mild LEFT neural foraminal narrowing. L4-5: Moderate broad-based LEFT subarticular disc protrusion. Small broad-based disc bulge, enhancing annular fissures. Mild canal stenosis, narrowed LEFT lateral recess which may affect the traversing LEFT L5 nerve. Mild LEFT neural foraminal narrowing. L5-S1: Small central disc protrusion without canal stenosis. Mild neural foraminal narrowing. IMPRESSION: 1. Diffusely abnormal bone marrow signal within the thoracolumbar and included sacral spine with heterogeneous enhancement highly concerning for infiltrative tumor (infiltrative metastatic disease versus myeloproliferative disorder or leukemia/lymphoma). No pathologic fracture. Atypical appearance for  infection. 2. Faint epidural enhancement, finding seen with venous congestion, less likely infection. 3. No thoracolumbar canal stenosis. Neural foraminal narrowing L3-4 through L5-S1: Moderate on the RIGHT at L3-4. Electronically Signed   By: Elon Alas M.D.   On: 10/19/2018 05:40   [place] Ekg Site Rite  Result Date: 10/20/2018 If Site Rite image not attached, placement could not be confirmed due to current cardiac rhythm.    ASSESSMENT AND PLAN:  67 year old male patient with history of diabetes mellitus type 2, hypertension, CVA currently under hospitalist service for endocarditis  -Aortic valve endocarditis Status post infectious disease consultation Continue IV Rocephin antibiotic All other antibiotics discontinued as per ID recommendation Patient will need antibiotics for 6 weeks duration PICC line placement ordered Blood cultures no growth so far  -Lumbosacral spine infiltrating lesion versus metastatic disease Appreciate oncology evaluation Bone marrow biopsy on Monday that is 10/22/2018  CT chest Diffuse/heterogeneous osseous sclerosis involving the visualized axial and appendicular skeleton. Differential considerations include renal osteodystrophy, diffuse osseous metastases secondary to malignancy (most commonly prostate cancer), or infiltrative disorders including lymphoma.  -Acute hypokalemia Replace potassium orally  -Hypertension with tachycardia Continue Cardizem CD along with lisinopril  -History of CVA Continue aspirin  -DVT prophylaxis subcu Lovenox daily    All the records are reviewed and case discussed with Care Management/Social Worker. Management plans discussed with the patient, family and they are in agreement.  CODE STATUS: Full code  DVT Prophylaxis: SCDs  TOTAL TIME TAKING CARE OF THIS PATIENT: 36 minutes.   POSSIBLE D/C IN 2 to 3 DAYS, DEPENDING ON CLINICAL CONDITION.  Saundra Shelling M.D on 10/20/2018 at 11:43 AM  Between 7am to  6pm - Pager - [phone]  After 6pm go to [email or web address] - password [email or web address] [facility]  Office  [phone]  CC: Primary care physician; Leone Haven, MD  Note: This dictation was prepared with Dragon dictation along with smaller phrase technology. Any transcriptional errors that result from this process are unintentional.

## 2018-10-20 NOTE — Evaluation (Signed)
Occupational Therapy Evaluation Patient Details Name: Richard Gordon MRN: [id number] DOB: 1951/06/05 Today's Date: 10/20/2018    History of Present Illness Pt is a 67 y/o M who presented with stiff legs, difficulty walking, weakness, fatigue.  Pt admitted for endocarditis.  Recent imaging suggestive of possible neurodegenerative disorder vs hypermetabolic state/metastatic disease.  Pts PMH includes stroke x2, syncope.    Clinical Impression   Pt seen for OT evaluation this date. Prior to hospital admission, pt was independent with all ADL and IADL tasks and used a SPC over the past few weeks before admission.  Pt was also easily fatigued requiring taking breaks during OOH activities.  Currently pt demonstrates impairments in balance, strength, safety awareness, activity tolerance and knowledge of use of DME/AE requiring Min assist for LB dressing and bathing. Pt requires Min/Mod A for toilet transfer with heavy VC for safety, speed and foot placement. Pt educated in energy conservation strategies when completing ADL  And IADL tasks. Pt verbalized understanding of all education presented. Pt would benefit from skilled OT to address noted impairments and functional limitations (see below for any additional details) in order to maximize safety and independence while minimizing falls risk and caregiver burden.  Upon hospital discharge, recommend pt discharge to CIR.    Follow Up Recommendations  CIR    Equipment Recommendations  3 in 1 bedside commode    Recommendations for Other Services       Precautions / Restrictions Precautions Precautions: Fall Precaution Comments: monitor HR Restrictions Weight Bearing Restrictions: No      Mobility Bed Mobility Overal bed mobility: Modified Independent Bed Mobility: Supine to Sit;Sit to Supine     Supine to sit: Modified independent (Device/Increase time) Sit to supine: Modified independent (Device/Increase time)   General bed mobility  comments: Use of BLE for momentum   Transfers Overall transfer level: Needs assistance Equipment used: Rolling walker (2 wheeled) Transfers: Sit to/from Stand Sit to Stand: Min assist              Balance Overall balance assessment: Needs assistance Sitting-balance support: No upper extremity supported;Feet supported Sitting balance-Leahy Scale: Fair Sitting balance - Comments: Pt able to don/doff socks at EOB without losing balance;   Standing balance support: Bilateral upper extremity supported Standing balance-Leahy Scale: Fair    General Comment: Pt noted decrease in muscle mass in BLE L>R despite exercising                         ADL either performed or assessed with clinical judgement   ADL Overall ADL's : Needs assistance/impaired Eating/Feeding: Sitting;Independent   Grooming: Standing;Min guard   Upper Body Bathing: Sitting;Supervision/ safety   Lower Body Bathing: Minimal assistance;Sit to/from stand;Cueing for safety   Upper Body Dressing : Supervision/safety;Sitting   Lower Body Dressing: Sit to/from stand;Cueing for sequencing;Minimal assistance   Toilet Transfer: Minimal assistance;Moderate assistance;Ambulation;Regular Toilet;Cueing for safety Toilet Transfer Details (indicate cue type and reason): Mod A to get up from toilet         Functional mobility during ADLs: Cueing for safety;Cueing for sequencing;Rolling walker;Minimal assistance       Vision Baseline Vision/History: Wears glasses Wears Glasses: At all times Patient Visual Report: No change from baseline       Perception     Praxis      Pertinent Vitals/Pain Pain Assessment: No/denies pain Pain Intervention(s): Monitored during session     Hand Dominance     Extremity/Trunk Assessment Upper  Extremity Assessment Upper Extremity Assessment: Overall WFL for tasks assessed(Pt 4+/5 grip and shoulders) LUE Deficits / Details: Pt 4/5 grip and shoulders(more difficulty  lifting LLE off bed than right; ataxic in LLE when walking)   Lower Extremity Assessment LLE Deficits / Details: more difficulty lifting LLE off bed than right; ataxic in LLE when walking LLE Sensation: WNL       Communication Communication Communication: No difficulties   Cognition Arousal/Alertness: Awake/alert Behavior During Therapy: WFL for tasks assessed/performed Overall Cognitive Status: Within Functional Limits for tasks assessed                                 General Comments: Impulsive with mobility and impaired safety awareness with mobility   General Comments       Exercises Other Exercises Other Exercises: Pt educated in and handout provided for energy conservation strategies to utilize during ADL and IADL tasks. Other Exercises: Pt ambulated to bathroom with RW, VC provided for correct hand/foot placement; to slow down; and safety awareness   Shoulder Instructions      Home Living Family/patient expects to be discharged to:: Private residence Living Arrangements: Alone Available Help at Discharge: Family;Available 24 hours/day Type of Home: Mobile home Home Access: Stairs to enter Entrance Stairs-Number of Steps: 4 Entrance Stairs-Rails: Right;Left Home Layout: One level     Bathroom Shower/Tub: Occupational psychologist: Standard     Home Equipment: Cane - single point          Prior Functioning/Environment Level of Independence: Independent        Comments: [place] and denies any imbalance.  Does not typically use AD but over the past few weeks has been using SPC intermittently as his LEs have felt sore.  Pt denies any falls in the past 12 months.  Pt ind with bathing, dressing, cooking, cleaning.         OT Problem List: Decreased strength;Impaired balance (sitting and/or standing);Decreased range of motion;Decreased safety awareness;Decreased activity tolerance;Decreased  knowledge of use of DME or AE      OT Treatment/Interventions: Self-care/ADL training;DME and/or AE instruction;Therapeutic activities;Balance training;Therapeutic exercise;Energy conservation;Patient/family education    OT Goals(Current goals can be found in the care plan section) Acute Rehab OT Goals Patient Stated Goal: to get back to doing yardwork Time For Goal Achievement: 11/03/18 Potential to Achieve Goals: Good ADL Goals Pt Will Perform Lower Body Dressing: with supervision;sit to/from stand Pt Will Transfer to Toilet: with supervision;ambulating(with raised toilet height, VC for hand/foot placement) Additional ADL Goal #1: Pt will utilize at least 3 energy conservation techniques when completing ADL and IADL tasks.  OT Frequency: Min 3X/week   Barriers to D/C:            Co-evaluation              AM-PAC PT "6 Clicks" Daily Activity     Outcome Measure Help from another person eating meals?: None Help from another person taking care of personal grooming?: None Help from another person toileting, which includes using toliet, bedpan, or urinal?: A Lot Help from another person bathing (including washing, rinsing, drying)?: A Little Help from another person to put on and taking off regular upper body clothing?: None Help from another person to put on and taking off regular lower body clothing?: A Little 6 Click Score: 20   End of Session  Equipment Utilized During Treatment: Gait belt;Rolling walker  Activity Tolerance: Patient tolerated treatment well Patient left: in bed;with call bell/phone within reach;with bed alarm set(MD in room when OT left)  OT Visit Diagnosis: Muscle weakness (generalized) (M62.81);Other abnormalities of gait and mobility (R26.89);Ataxia, unspecified (R27.0)                Time: 4010-2725 OT Time Calculation (min): 36 min Charges:     Jadene Pierini OTS   10/20/2018, 11:51 AM

## 2018-10-20 NOTE — Progress Notes (Signed)
[facility] Hematology/Oncology Progress Note  Date of admission: 10/16/2018  Hospital day:  10/20/2018  Chief Complaint: Richard Gordon is a 67 y.o. male who was admitted through the emergency room with suspected bacterial endocarditis and sepsis.  Subjective:  No new complaints.  Social History: The patient is alone today.  Allergies: No Known Allergies  Scheduled Medications: . aspirin EC  81 mg Oral Daily  . diltiazem  30 mg Oral Q8H  . docusate sodium  100 mg Oral BID  . enoxaparin (LOVENOX) injection  40 mg Subcutaneous Q24H  . feeding supplement (GLUCERNA SHAKE)  237 mL Oral TID BM  . insulin aspart  0-15 Units Subcutaneous TID WC  . insulin aspart  0-5 Units Subcutaneous QHS  . insulin glargine  20 Units Subcutaneous QHS  . lisinopril  20 mg Oral Daily  . metoprolol tartrate  12.5 mg Oral BID  . sodium chloride flush  3 mL Intravenous Q12H    Review of Systems: GENERAL:  Fatigue.  No fevers.  Some sweats.  Weight loss of 15 pounds prior to admission. PERFORMANCE STATUS (ECOG):  2 HEENT:  No oral pain.  No visual changes, runny nose, sore throat, mouth sores or tenderness. Lungs: No shortness of breath or cough.  No hemoptysis. Cardiac:  No chest pain, palpitations, orthopnea, or PND. GI:  No nausea, vomiting, diarrhea, constipation, melena or hematochezia. GU:  No urgency, frequency, dysuria, or hematuria. Musculoskeletal:  Leg ache.  No muscle tenderness. Extremities:  Muscle atrophy.  No pain or swelling. Skin:  No rashes or skin changes. Neuro:  Chronic left arm numbness s/p CVA.  No headache, focal weakness, balance or coordination issues. Endocrine:  No diabetes, thyroid issues, hot flashes or night sweats. Psych:  No mood changes, depression or anxiety. Pain:  No focal pain. Review of systems:  All other systems reviewed and found to be negative.  Physical Exam: Blood pressure 135/65, pulse 88, temperature 98.5 F (36.9 C), temperature  source Oral, resp. rate 18, height 5' 10" (1.778 m), weight 150 lb 1.6 oz (68.1 kg), SpO2 99 %.  GENERAL:  Slightly fatigued appearing gentleman sitting comfortably on the medical unit in no acute distress. MENTAL STATUS:  Alert and oriented to person, place and time. HEAD:  Thin red hair with graying of sideburns.  Normocephalic, atraumatic, face symmetric, no Cushingoid features. EYES:  Glasses.  Blue eyes.  Pupils equal round and reactive to light and accomodation.  No conjunctivitis or scleral icterus. ENT:  Oropharynx clear without lesion.  Tongue normal. Mucous membranes moist.  RESPIRATORY:  Clear to auscultation without rales, wheezes or rhonchi. CARDIOVASCULAR:  Regular rate and rhythm without murmur, rub or gallop. ABDOMEN:  Soft, non-tender, with active bowel sounds, and no hepatosplenomegaly.  No masses. SKIN:  No rashes, ulcers or lesions. EXTREMITIES: Lower extremity muscle wasting.  No edema, no skin discoloration or tenderness.  No palpable cords. NEUROLOGICAL: Unremarkable. PSYCH:  Appropriate.   Results for orders placed or performed during the hospital encounter of 10/16/18 (from the past 48 hour(s))  Glucose, capillary     Status: Abnormal   Collection Time: 10/18/18  9:24 PM  Result Value Ref Range   Glucose-Capillary 128 (H) 70 - 99 mg/dL  Comprehensive metabolic panel     Status: Abnormal   Collection Time: 10/19/18  5:29 AM  Result Value Ref Range   Sodium 136 135 - 145 mmol/L   Potassium 3.5 3.5 - 5.1 mmol/L   Chloride 100 98 -  111 mmol/L   CO2 25 22 - 32 mmol/L   Glucose, Bld 113 (H) 70 - 99 mg/dL   BUN 14 8 - 23 mg/dL   Creatinine, Ser 0.93 0.61 - 1.24 mg/dL   Calcium 7.8 (L) 8.9 - 10.3 mg/dL   Total Protein 5.7 (L) 6.5 - 8.1 g/dL   Albumin 2.4 (L) 3.5 - 5.0 g/dL   AST 15 15 - 41 U/L   ALT 15 0 - 44 U/L   Alkaline Phosphatase 234 (H) 38 - 126 U/L   Total Bilirubin 0.8 0.3 - 1.2 mg/dL   GFR calc non Af Amer >60 >60 mL/min   GFR calc Af Amer >60 >60  mL/min    Comment: (NOTE) The eGFR has been calculated using the CKD EPI equation. This calculation has not been validated in all clinical situations. eGFR's persistently <60 mL/min signify possible Chronic Kidney Disease.    Anion gap 11 5 - 15    Comment: Performed at [facility], [address] [place], [place] [address]  CBC     Status: Abnormal   Collection Time: 10/19/18  5:29 AM  Result Value Ref Range   WBC 14.3 (H) 4.0 - 10.5 K/uL   RBC 2.95 (L) 4.22 - 5.81 MIL/uL   Hemoglobin 8.9 (L) 13.0 - 17.0 g/dL   HCT 28.1 (L) 39.0 - 52.0 %   MCV 95.3 80.0 - 100.0 fL   MCH 30.2 26.0 - 34.0 pg   MCHC 31.7 30.0 - 36.0 g/dL   RDW 15.6 (H) 11.5 - 15.5 %   Platelets 163 150 - 400 K/uL   nRBC 0.0 0.0 - 0.2 %    Comment: Performed at [facility], [address] [place], [place] [address]  Lipase, blood     Status: None   Collection Time: 10/19/18  5:29 AM  Result Value Ref Range   Lipase 26 11 - 51 U/L    Comment: Performed at [facility], [address] [place], [place] [address]  PSA     Status: None   Collection Time: 10/19/18  5:29 AM  Result Value Ref Range   Prostatic Specific Antigen 0.26 0.00 - 4.00 ng/mL    Comment: (NOTE) While PSA levels of <=4.0 ng/ml are reported as reference range, some men with levels below 4.0 ng/ml can have prostate cancer and many men with PSA above 4.0 ng/ml do not have prostate cancer.  Other tests such as free PSA, age specific reference ranges, PSA velocity and PSA doubling time may be helpful especially in men less than 68 years old. Performed at [facility], [place] [address], [place], [place] [address]   Glucose, capillary     Status: None   Collection Time: 10/19/18  7:21 AM  Result Value Ref Range   Glucose-Capillary 92 70 - 99 mg/dL  Lactate dehydrogenase     Status: Abnormal   Collection Time: 10/19/18  9:09 AM  Result Value Ref Range   LDH 1,239 (H) 98 - 192 U/L    Comment: Performed at  [facility], [address] [place], [place] [address]  Uric acid     Status: None   Collection Time: 10/19/18  9:09 AM  Result Value Ref Range   Uric Acid, Serum 5.0 3.7 - 8.6 mg/dL    Comment: Performed at [facility], [address] [place],  [address]  Glucose, capillary     Status: Abnormal   Collection Time: 10/19/18  11:47 AM  Result Value Ref Range   Glucose-Capillary 132 (H) 70 - 99 mg/dL  Glucose, capillary     Status: Abnormal   Collection Time: 10/19/18  5:03 PM  Result Value Ref Range   Glucose-Capillary 197 (H) 70 - 99 mg/dL  Glucose, capillary     Status: Abnormal   Collection Time: 10/19/18  9:08 PM  Result Value Ref Range   Glucose-Capillary 120 (H) 70 - 99 mg/dL  Sedimentation rate     Status: Abnormal   Collection Time: 10/20/18  5:38 AM  Result Value Ref Range   Sed Rate 96 (H) 0 - 20 mm/hr    Comment: Performed at [facility], [address] [place], [place] [address]  Comprehensive metabolic panel     Status: Abnormal   Collection Time: 10/20/18  5:38 AM  Result Value Ref Range   Sodium 136 135 - 145 mmol/L   Potassium 3.2 (L) 3.5 - 5.1 mmol/L   Chloride 101 98 - 111 mmol/L   CO2 27 22 - 32 mmol/L   Glucose, Bld 79 70 - 99 mg/dL   BUN 15 8 - 23 mg/dL   Creatinine, Ser 0.96 0.61 - 1.24 mg/dL   Calcium 8.0 (L) 8.9 - 10.3 mg/dL   Total Protein 5.7 (L) 6.5 - 8.1 g/dL   Albumin 2.4 (L) 3.5 - 5.0 g/dL   AST 16 15 - 41 U/L   ALT 14 0 - 44 U/L   Alkaline Phosphatase 216 (H) 38 - 126 U/L   Total Bilirubin 0.8 0.3 - 1.2 mg/dL   GFR calc non Af Amer >60 >60 mL/min   GFR calc Af Amer >60 >60 mL/min    Comment: (NOTE) The eGFR has been calculated using the CKD EPI equation. This calculation has not been validated in all clinical situations. eGFR's persistently <60 mL/min signify possible Chronic Kidney Disease.    Anion gap 8 5 - 15    Comment: Performed at [facility], [address] [place], [place]  [address]  Ferritin     Status: Abnormal   Collection Time: 10/20/18  5:38 AM  Result Value Ref Range   Ferritin 1,789 (H) 24 - 336 ng/mL    Comment: Performed at [facility], [address] [place], [place] [address]  Iron and TIBC     Status: Abnormal   Collection Time: 10/20/18  5:38 AM  Result Value Ref Range   Iron 42 (L) 45 - 182 ug/dL   TIBC 127 (L) 250 - 450 ug/dL   Saturation Ratios 33 17.9 - 39.5 %   UIBC 85 ug/dL    Comment: Performed at [facility], [address] [place], [place] [address]  CBC with Differential/Platelet     Status: Abnormal   Collection Time: 10/20/18  5:38 AM  Result Value Ref Range   WBC 14.9 (H) 4.0 - 10.5 K/uL   RBC 3.01 (L) 4.22 - 5.81 MIL/uL   Hemoglobin 8.9 (L) 13.0 - 17.0 g/dL   HCT 28.0 (L) 39.0 - 52.0 %   MCV 93.0 80.0 - 100.0 fL   MCH 29.6 26.0 - 34.0 pg   MCHC 31.8 30.0 - 36.0 g/dL   RDW 15.6 (H) 11.5 - 15.5 %   Platelets 173 150 - 400 K/uL   nRBC 0.2 0.0 - 0.2 %   Neutrophils Relative % 69 %   Neutro Abs 10.1 (H) 1.7 - 7.7 K/uL   Lymphocytes Relative 12 %  Lymphs Abs 1.8 0.7 - 4.0 K/uL   Monocytes Relative 10 %   Monocytes Absolute 1.5 (H) 0.1 - 1.0 K/uL   Eosinophils Relative 0 %   Eosinophils Absolute 0.0 0.0 - 0.5 K/uL   Basophils Relative 0 %   Basophils Absolute 0.1 0.0 - 0.1 K/uL   WBC Morphology      MODERATE LEFT SHIFT (>5% METAS AND MYELOS,OCC PRO NOTED)    Comment: TOXIC GRANULATION   Smear Review Normal platelet morphology    Immature Granulocytes 9 %   Abs Immature Granulocytes 1.40 (H) 0.00 - 0.07 K/uL   Polychromasia PRESENT     Comment: Performed at [facility], [address] [place], [place] [address]  Glucose, capillary     Status: None   Collection Time: 10/20/18  8:36 AM  Result Value Ref Range   Glucose-Capillary 81 70 - 99 mg/dL  Glucose, capillary     Status: Abnormal   Collection Time: 10/20/18 11:47 AM  Result Value Ref Range   Glucose-Capillary 141 (H) 70 - 99  mg/dL  Glucose, capillary     Status: None   Collection Time: 10/20/18  4:41 PM  Result Value Ref Range   Glucose-Capillary 83 70 - 99 mg/dL   Ct Chest Wo Contrast  Result Date: 10/20/2018 CLINICAL DATA:  Cough, fever. Possible endocarditis on TEE. Abnormal marrow signal on thoracolumbar spine MRI raises concern for malignancy. EXAM: CT CHEST WITHOUT CONTRAST TECHNIQUE: Multidetector CT imaging of the chest was performed following the standard protocol without IV contrast. COMPARISON:  Correlation with thoracolumbar spine MRI dated 10/19/2018 and recent CT abdomen/pelvis dated 10/10/2018. FINDINGS: Cardiovascular: The heart is normal in size. No pericardial effusion. No evidence of thoracic aortic aneurysm. Atherosclerotic calcifications of the aortic arch. Three vessel coronary atherosclerosis. Mediastinum/Nodes: No suspicious mediastinal or axillary lymphadenopathy. 7 mm short axis azygoesophageal recess node (series 2/image 80), within normal limits, previously 9 mm. Visualized thyroid is unremarkable. Lungs/Pleura: Lungs are essentially clear. Mild dependent atelectasis in the bilateral lower lobes. No focal consolidation. No pleural effusion or pneumothorax. Upper Abdomen: Visualized upper abdomen is notable for mild stranding along the bilateral adrenal glands and kidneys, possibly improved from recent CT. Musculoskeletal: Diffuse/heterogeneous osseous sclerosis involving the visualized axial and appendicular skeleton including the thoracolumbar spine and sternum. IMPRESSION: Diffuse/heterogeneous osseous sclerosis involving the visualized axial and appendicular skeleton. Differential considerations include renal osteodystrophy, diffuse osseous metastases secondary to malignancy (most commonly prostate cancer), or infiltrative disorders including lymphoma. Otherwise, no findings suspicious for malignancy in the chest. Aortic Atherosclerosis (ICD10-I70.0). Electronically Signed   By: Julian Hy  M.D.   On: 10/20/2018 10:15   Mr Brain Wo Contrast  Result Date: 10/19/2018 CLINICAL DATA:  Progressive leg weakness, altered mental status. Sepsis. History of stroke, hypertension, hyperlipidemia, diabetes. EXAM: MRI HEAD WITHOUT CONTRAST TECHNIQUE: Multiplanar, multiecho pulse sequences of the brain and surrounding structures were obtained without intravenous contrast. COMPARISON:  CT face October 11, 2018, MRI head March 14, 2011 FINDINGS: INTRACRANIAL CONTENTS: No reduced diffusion to suggest acute ischemia. No susceptibility artifact to suggest hemorrhage. Disproportionate severely atrophic cerebellum similar to progressed. Moderately atrophic pons. No hydrocephalus. LEFT inferior frontal lobe encephalomalacia, given enlargement of LEFT frontal sinuses most compatible with neonatal insult. Old RIGHT thalamus infarct. Old LEFT parietal infarct with mild ex vacuo dilatation subjacent ventricle. No hydrocephalus. No midline shift, mass effect or masses. No abnormal extra-axial fluid collections. VASCULAR: Normal major intracranial vascular flow voids present at skull base. SKULL AND UPPER CERVICAL SPINE:  No abnormal sellar expansion. No suspicious calvarial bone marrow signal. Craniocervical junction maintained. SINUSES/ORBITS: Mild paranasal sinus mucosal thickening without air-fluid levels. Mastoid air cells are well aerated.The included ocular globes and orbital contents are non-suspicious. Status post RIGHT ocular lens implant. OTHER: None. IMPRESSION: 1. No acute intracranial process. 2. 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 3. Old small LEFT parietal/MCA territory infarct. Old RIGHT thalamus infarct. 4. 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 Electronically Signed   By: Elon Alas M.D.   On: 10/19/2018 05:13   Mr Thoracic Spine W Wo Contrast  Result Date: 10/19/2018 CLINICAL DATA:  Mid and lower back pain,  leg weakness. Sepsis. Evaluate for spinal infection. History of endocarditis, diabetes. EXAM: MRI THORACIC AND LUMBAR SPINE WITHOUT AND WITH CONTRAST TECHNIQUE: Multiplanar and multiecho pulse sequences of the thoracic and lumbar spine were obtained without and with intravenous contrast. CONTRAST:  7 cc Gadavist COMPARISON:  CT abdomen and pelvis October 10, 2018 FINDINGS: MRI THORACIC SPINE FINDINGS ALIGNMENT: Maintenance of the thoracic kyphosis. No malalignment. VERTEBRAE/DISCS: Vertebral bodies are intact. Diffusely heterogeneous bone marrow signal with patchy bright STIR signal in superimposed irregular enhancement of nearly all thoracic vertebral bodies. Preservation of the cortex. Intervertebral disc morphologies and signal maintained. No abnormal disc enhancement. CORD: Thoracic spinal cord is normal morphology and signal characteristics. No abnormal cord or leptomeningeal enhancement. Epidural enhancement within the included cervical spine. Minimal ventral epidural enhancement versus failure of fat saturation. PREVERTEBRAL AND PARASPINAL SOFT TISSUES:  Nonacute. DISC LEVELS: No disc bulge, canal stenosis nor neural foraminal narrowing. MRI LUMBAR SPINE FINDINGS SEGMENTATION: For the purposes of this report, the last well-formed intervertebral disc is reported as L5-S1. ALIGNMENT: Maintained lumbar lordosis. No malalignment. VERTEBRAE: Vertebral bodies are intact. Diffusely heterogeneous bone marrow signal with patchy bright STIR signal in superimposed irregular enhancement of all lumbar vertebral bodies and included sacrum. Preservation of the cortex. Intervertebral disc morphologies and signal maintained. No abnormal disc enhancement. Congenital canal narrowing on the basis of foreshortened pedicles. CONUS MEDULLARIS AND CAUDA EQUINA: Conus medullaris terminates at L1-2 and demonstrates normal morphology and signal characteristics. Cauda equina is normal. No abnormal cord, leptomeningeal or epidural  enhancement. PARASPINAL AND OTHER SOFT TISSUES: Mild prevertebral fat stranding without focal fluid collection or mass. Low-grade paraspinal muscle strain DISC LEVELS: L1-2: Annular bulging enhancing annular fissure. No canal stenosis or neural foraminal narrowing. L2-3: No disc bulge, canal stenosis nor neural foraminal narrowing. L3-4: Annular bulging, small LEFT subarticular and RIGHT extraforaminal disc protrusions. No canal stenosis. Moderate RIGHT and mild LEFT neural foraminal narrowing. L4-5: Moderate broad-based LEFT subarticular disc protrusion. Small broad-based disc bulge, enhancing annular fissures. Mild canal stenosis, narrowed LEFT lateral recess which may affect the traversing LEFT L5 nerve. Mild LEFT neural foraminal narrowing. L5-S1: Small central disc protrusion without canal stenosis. Mild neural foraminal narrowing. IMPRESSION: 1. Diffusely abnormal bone marrow signal within the thoracolumbar and included sacral spine with heterogeneous enhancement highly concerning for infiltrative tumor (infiltrative metastatic disease versus myeloproliferative disorder or leukemia/lymphoma). No pathologic fracture. Atypical appearance for infection. 2. Faint epidural enhancement, finding seen with venous congestion, less likely infection. 3. No thoracolumbar canal stenosis. Neural foraminal narrowing L3-4 through L5-S1: Moderate on the RIGHT at L3-4. Electronically Signed   By: Elon Alas M.D.   On: 10/19/2018 05:40   Mr Lumbar Spine W Wo Contrast  Result Date: 10/19/2018 CLINICAL DATA:  Mid and lower back pain, leg weakness. Sepsis. Evaluate for spinal infection. History of endocarditis, diabetes.  EXAM: MRI THORACIC AND LUMBAR SPINE WITHOUT AND WITH CONTRAST TECHNIQUE: Multiplanar and multiecho pulse sequences of the thoracic and lumbar spine were obtained without and with intravenous contrast. CONTRAST:  7 cc Gadavist COMPARISON:  CT abdomen and pelvis October 10, 2018 FINDINGS: MRI THORACIC  SPINE FINDINGS ALIGNMENT: Maintenance of the thoracic kyphosis. No malalignment. VERTEBRAE/DISCS: Vertebral bodies are intact. Diffusely heterogeneous bone marrow signal with patchy bright STIR signal in superimposed irregular enhancement of nearly all thoracic vertebral bodies. Preservation of the cortex. Intervertebral disc morphologies and signal maintained. No abnormal disc enhancement. CORD: Thoracic spinal cord is normal morphology and signal characteristics. No abnormal cord or leptomeningeal enhancement. Epidural enhancement within the included cervical spine. Minimal ventral epidural enhancement versus failure of fat saturation. PREVERTEBRAL AND PARASPINAL SOFT TISSUES:  Nonacute. DISC LEVELS: No disc bulge, canal stenosis nor neural foraminal narrowing. MRI LUMBAR SPINE FINDINGS SEGMENTATION: For the purposes of this report, the last well-formed intervertebral disc is reported as L5-S1. ALIGNMENT: Maintained lumbar lordosis. No malalignment. VERTEBRAE: Vertebral bodies are intact. Diffusely heterogeneous bone marrow signal with patchy bright STIR signal in superimposed irregular enhancement of all lumbar vertebral bodies and included sacrum. Preservation of the cortex. Intervertebral disc morphologies and signal maintained. No abnormal disc enhancement. Congenital canal narrowing on the basis of foreshortened pedicles. CONUS MEDULLARIS AND CAUDA EQUINA: Conus medullaris terminates at L1-2 and demonstrates normal morphology and signal characteristics. Cauda equina is normal. No abnormal cord, leptomeningeal or epidural enhancement. PARASPINAL AND OTHER SOFT TISSUES: Mild prevertebral fat stranding without focal fluid collection or mass. Low-grade paraspinal muscle strain DISC LEVELS: L1-2: Annular bulging enhancing annular fissure. No canal stenosis or neural foraminal narrowing. L2-3: No disc bulge, canal stenosis nor neural foraminal narrowing. L3-4: Annular bulging, small LEFT subarticular and RIGHT  extraforaminal disc protrusions. No canal stenosis. Moderate RIGHT and mild LEFT neural foraminal narrowing. L4-5: Moderate broad-based LEFT subarticular disc protrusion. Small broad-based disc bulge, enhancing annular fissures. Mild canal stenosis, narrowed LEFT lateral recess which may affect the traversing LEFT L5 nerve. Mild LEFT neural foraminal narrowing. L5-S1: Small central disc protrusion without canal stenosis. Mild neural foraminal narrowing. IMPRESSION: 1. Diffusely abnormal bone marrow signal within the thoracolumbar and included sacral spine with heterogeneous enhancement highly concerning for infiltrative tumor (infiltrative metastatic disease versus myeloproliferative disorder or leukemia/lymphoma). No pathologic fracture. Atypical appearance for infection. 2. Faint epidural enhancement, finding seen with venous congestion, less likely infection. 3. No thoracolumbar canal stenosis. Neural foraminal narrowing L3-4 through L5-S1: Moderate on the RIGHT at L3-4. Electronically Signed   By: Elon Alas M.D.   On: 10/19/2018 05:40   [place] Ekg Site Rite  Result Date: 10/20/2018 If Site Rite image not attached, placement could not be confirmed due to current cardiac rhythm.   Assessment:  Richard Gordon is a 68 y.o. male with history of fever and leukocytosis with left shift concerning for endocarditis.  TEE on 10/18/2018 revealed a probable vegetation on the aortic valve.  Cultures are negative.  He is on broad spectrum antibiotics.  He has a 1 year history of declining stamina, fatigue, and muscle atrophy in his lower extremities.  Recently this has affected his ability to walk.  CK is normal.  Alkaline phosphatase is elevated.  Abdomen and pelvic CT on 10/10/2018 revealed soft tissue stranding in the retroperitoneal space of the abdomen, diffusely around the pancreas, adrenal glands, and kidneys. These changes were fairly evenly distributed without a definite epicenter making the  appearance indeterminate. Changes around the pancreas could be related  to pancreatitis and the perirenal edema could reflect pyelonephritis. Bilateral symmetric periadrenal edema/stranding was of uncertain significance.  There was no evidence for discrete, focal abscess in the abdomen/pelvis.   Thoracic and lumbar spine MRI on 10/18/2018 revealed diffusely abnormal bone marrow signal within the thoracolumbar and included sacral spine with heterogeneous enhancement highly concerning for infiltrative tumor.  PSA is 0.26.  Chest CT on 10/20/2018 revealed diffuse/heterogeneous osseous sclerosis involving the visualized axial and appendicular skeleton. Differential considerations include renal osteodystrophy, diffuse osseous metastases secondary to malignancy (most commonly prostate cancer), or infiltrative disorders including lymphoma.  Symptomatically, he remains fatigued.  Endurance is limited.  His legs ache.  Exam reveals no adenopathy or hepatosplenomegaly.    Plan:   1.  Abnormal bone marrow signal:             Images personally reviewed with radiology.  Mixed lytic and sclerotic changes.               Etiology unclear, but felt unlikely related to infection.             Patient has anemia, stable, and mild leukocytosis, improving.             Peripheral smear reveals left shifted WBCs.             Myeloma work-up underway.             No palpable adenopathy or adenopathy seen on CT scans to suggest lymphoma.             LDH is elevated (differential broad).  Uric acid is normal.             Chest CT reveals no lung lesions or adenopathy, but diffuse osseous sclerosis.             Bone marrow aspirate and biopsy on Monday, 10/22/2018. 2.  Anemia and mild leukocytosis:             Suspect chronic disease with possible bone marrow infiltrative process.             Leukocytosis stable/improving with treatment of infection.             No current evidence of hemolysis.             Ferritin  elevated (likley acute phase reactant with elevated sed rate).  Iron saturation normal.              Myeloma work-up in progress. 3.  Elevated alkaline phosphatase:             Etiology likely secondary to bone related disease.             Postpone bone scan (per radiology). 4.  Abnormal soft tissue stranding in retroperitoneum:             Etiology unknown.             No obvious mass. 5.  Infectious disease:             Undergoing treatment for endocarditis.             TEE revealed a probable aortic valve vegetation.             Patient on ceftriaxone.   Lequita Asal, MD  10/20/2018, 5:44 PM

## 2018-10-20 NOTE — Progress Notes (Signed)
Spoke with Richard Schwartz, RN concerning PICC placement. Ensured her that it may be today but possibly tomorrow. Patient has good IV access presently and sited to discharge in 2-3 days.

## 2018-10-21 LAB — CBC
HEMATOCRIT: 29.7 % — AB (ref 39.0–52.0)
Hemoglobin: 9.3 g/dL — ABNORMAL LOW (ref 13.0–17.0)
MCH: 29.7 pg (ref 26.0–34.0)
MCHC: 31.3 g/dL (ref 30.0–36.0)
MCV: 94.9 fL (ref 80.0–100.0)
NRBC: 0.2 % (ref 0.0–0.2)
Platelets: 206 10*3/uL (ref 150–400)
RBC: 3.13 MIL/uL — ABNORMAL LOW (ref 4.22–5.81)
RDW: 15.8 % — AB (ref 11.5–15.5)
WBC: 18.6 10*3/uL — ABNORMAL HIGH (ref 4.0–10.5)

## 2018-10-21 LAB — CULTURE, BLOOD (ROUTINE X 2)
CULTURE: NO GROWTH
Culture: NO GROWTH
SPECIAL REQUESTS: ADEQUATE

## 2018-10-21 LAB — BASIC METABOLIC PANEL
Anion gap: 11 (ref 5–15)
BUN: 19 mg/dL (ref 8–23)
CO2: 25 mmol/L (ref 22–32)
CREATININE: 1.06 mg/dL (ref 0.61–1.24)
Calcium: 8.1 mg/dL — ABNORMAL LOW (ref 8.9–10.3)
Chloride: 101 mmol/L (ref 98–111)
GFR calc Af Amer: >60 mL/min (ref >60)
GFR calc non Af Amer: >60 mL/min (ref >60)
GLUCOSE: 102 mg/dL — AB (ref 70–99)
Potassium: 3.7 mmol/L (ref 3.5–5.1)
Sodium: 137 mmol/L (ref 135–145)

## 2018-10-21 LAB — GLUCOSE, CAPILLARY
GLUCOSE-CAPILLARY: 200 mg/dL — AB (ref 70–99)
Glucose-Capillary: 97 mg/dL (ref 70–99)
Glucose-Capillary: 129 mg/dL — ABNORMAL HIGH (ref 70–99)
Glucose-Capillary: 152 mg/dL — ABNORMAL HIGH (ref 70–99)

## 2018-10-21 MED ORDER — SODIUM CHLORIDE 0.9 % IV SOLN
INTRAVENOUS | Status: DC
  Administered 2018-10-22 – 2018-10-25 (×3): via INTRAVENOUS

## 2018-10-21 MED ORDER — SODIUM CHLORIDE 0.9% FLUSH: 10.0000 mL | INTRAVENOUS | Status: DC | PRN

## 2018-10-21 NOTE — Progress Notes (Signed)
Spoke with IV Team, ETA for PICC placement approx 30 min.  [id number]

## 2018-10-21 NOTE — Progress Notes (Signed)
Peripherally Inserted Central Catheter/Midline Placement  The IV Nurse has discussed with the patient and/or persons authorized to consent for the patient, the purpose of this procedure and the potential benefits and risks involved with this procedure.  The benefits include less needle sticks, lab draws from the catheter, and the patient may be discharged home with the catheter. Risks include, but not limited to, infection, bleeding, blood clot (thrombus formation), and puncture of an artery; nerve damage and irregular heartbeat and possibility to perform a PICC exchange if needed/ordered by physician.  Alternatives to this procedure were also discussed.  Bard Power PICC patient education guide, fact sheet on infection prevention and patient information card has been provided to patient /or left at bedside.    PICC/Midline Placement Documentation  PICC Single Lumen 10/21/18 PICC Right Brachial 40 cm (Active)  Indication for Insertion or Continuance of Line Home intravenous therapies (PICC only) 10/21/2018  2:00 PM  Exposed Catheter (cm) 0 cm 10/21/2018  2:00 PM  Site Assessment Clean;Dry;Intact 10/21/2018  2:00 PM  Line Status Flushed;Blood return noted;Saline locked 10/21/2018  2:00 PM  Dressing Type Transparent 10/21/2018  2:00 PM  Dressing Status Clean;Dry;Intact;Antimicrobial disc in place 10/21/2018  2:00 PM  Line Care Connections checked and tightened 10/21/2018  2:00 PM  Dressing Change Due 10/28/18 10/21/2018  2:00 PM       Lorenza Cambridge 10/21/2018, 2:09 PM

## 2018-10-21 NOTE — Progress Notes (Signed)
[facility] NAME: Richard Gordon    MR#:  [id number]  DATE OF BIRTH:  07-Apr-1951  SUBJECTIVE:  CHIEF COMPLAINT:   Chief Complaint  Patient presents with  . Muscle Pain  Patient seen and evaluated today No muscle aches today Tolerating diet well No nausea or vomiting  REVIEW OF SYSTEMS:    ROS  CONSTITUTIONAL: No documented fever.  Has fatigue, weakness. No weight gain, no weight loss.  EYES: No blurry or double vision.  ENT: No tinnitus. No postnasal drip. No redness of the oropharynx.  RESPIRATORY: No cough, no wheeze, no hemoptysis. No dyspnea.  CARDIOVASCULAR: No chest pain. No orthopnea. No palpitations. No syncope.  GASTROINTESTINAL: No nausea, no vomiting or diarrhea. No abdominal pain. No melena or hematochezia.  GENITOURINARY: No dysuria or hematuria.  ENDOCRINE: No polyuria or nocturia. No heat or cold intolerance.  HEMATOLOGY: No anemia. No bruising. No bleeding.  INTEGUMENTARY: No rashes. No lesions.  MUSCULOSKELETAL: No arthritis. No swelling. No gout.  NEUROLOGIC: No numbness, tingling, or ataxia. No seizure-type activity.  PSYCHIATRIC: No anxiety. No insomnia. No ADD.   DRUG ALLERGIES:  No Known Allergies  VITALS:  Blood pressure (!) 128/59, pulse 89, temperature 98.1 F (36.7 C), temperature source Oral, resp. rate 19, height 5' 10" (1.778 m), weight 66.3 kg, SpO2 95 %.  PHYSICAL EXAMINATION:   Physical Exam  GENERAL:  67 y.o.-year-old patient lying in the bed with no acute distress.  EYES: Pupils equal, round, reactive to light and accommodation. No scleral icterus. Extraocular muscles intact.  HEENT: Head atraumatic, normocephalic. Oropharynx and nasopharynx clear.  NECK:  Supple, no jugular venous distention. No thyroid enlargement, no tenderness.  LUNGS: Normal breath sounds bilaterally, no wheezing, rales, rhonchi. No use of accessory muscles of respiration.  CARDIOVASCULAR: S1, S2 normal. No  murmurs, rubs, or gallops.  ABDOMEN: Soft, nontender, nondistended. Bowel sounds present. No organomegaly or mass.  EXTREMITIES: No cyanosis, clubbing or edema b/l.    NEUROLOGIC: Cranial nerves II through XII are intact. No focal Motor or sensory deficits b/l.   PSYCHIATRIC: The patient is alert and oriented x 3.  SKIN: No obvious rash, lesion, or ulcer.   LABORATORY PANEL:   CBC Recent Labs  Lab 10/21/18 0459  WBC 18.6*  HGB 9.3*  HCT 29.7*  PLT 206   ------------------------------------------------------------------------------------------------------------------ Chemistries  Recent Labs  Lab 10/17/18 0528  10/20/18 0538 10/21/18 0459  NA 136   < > 136 137  K 3.3*   < > 3.2* 3.7  CL 103   < > 101 101  CO2 23   < > 27 25  GLUCOSE 131*   < > 79 102*  BUN 13   < > 15 19  CREATININE 0.98   < > 0.96 1.06  CALCIUM 7.6*   < > 8.0* 8.1*  MG 1.7  --   --   --   AST  --    < > 16  --   ALT  --    < > 14  --   ALKPHOS  --    < > 216*  --   BILITOT  --    < > 0.8  --    < > = values in this interval not displayed.   ------------------------------------------------------------------------------------------------------------------  Cardiac Enzymes Recent Labs  Lab 10/16/18 1236  TROPONINI <0.03   ------------------------------------------------------------------------------------------------------------------  RADIOLOGY:  Ct Chest Wo Contrast  Result Date: 10/20/2018 CLINICAL DATA:  Cough, fever.  Possible endocarditis on TEE. Abnormal marrow signal on thoracolumbar spine MRI raises concern for malignancy. EXAM: CT CHEST WITHOUT CONTRAST TECHNIQUE: Multidetector CT imaging of the chest was performed following the standard protocol without IV contrast. COMPARISON:  Correlation with thoracolumbar spine MRI dated 10/19/2018 and recent CT abdomen/pelvis dated 10/10/2018. FINDINGS: Cardiovascular: The heart is normal in size. No pericardial effusion. No evidence of thoracic  aortic aneurysm. Atherosclerotic calcifications of the aortic arch. Three vessel coronary atherosclerosis. Mediastinum/Nodes: No suspicious mediastinal or axillary lymphadenopathy. 7 mm short axis azygoesophageal recess node (series 2/image 80), within normal limits, previously 9 mm. Visualized thyroid is unremarkable. Lungs/Pleura: Lungs are essentially clear. Mild dependent atelectasis in the bilateral lower lobes. No focal consolidation. No pleural effusion or pneumothorax. Upper Abdomen: Visualized upper abdomen is notable for mild stranding along the bilateral adrenal glands and kidneys, possibly improved from recent CT. Musculoskeletal: Diffuse/heterogeneous osseous sclerosis involving the visualized axial and appendicular skeleton including the thoracolumbar spine and sternum. IMPRESSION: Diffuse/heterogeneous osseous sclerosis involving the visualized axial and appendicular skeleton. Differential considerations include renal osteodystrophy, diffuse osseous metastases secondary to malignancy (most commonly prostate cancer), or infiltrative disorders including lymphoma. Otherwise, no findings suspicious for malignancy in the chest. Aortic Atherosclerosis (ICD10-I70.0). Electronically Signed   By: Julian Hy M.D.   On: 10/20/2018 10:15   [place] Ekg Site Rite  Result Date: 10/20/2018 If Site Rite image not attached, placement could not be confirmed due to current cardiac rhythm.    ASSESSMENT AND PLAN:  67 year old male patient with history of diabetes mellitus type 2, hypertension, CVA currently under hospitalist service for endocarditis  -Aortic valve endocarditis Status post infectious disease consultation and follow-up Continue IV Rocephin antibiotic All other antibiotics discontinued as per ID recommendation Patient will need antibiotics for 6 weeks duration of total antibiotic therapy IV PICC line placement today Blood cultures no growth so far  -Lumbosacral spine infiltrating lesion  versus metastatic disease Appreciate oncology evaluation Bone marrow biopsy on Monday that is 10/22/2018 Appreciate oncology follow-up  CT chest Diffuse/heterogeneous osseous sclerosis involving the visualized axial and appendicular skeleton. Differential considerations include renal osteodystrophy, diffuse osseous metastases secondary to malignancy (most commonly prostate cancer), or infiltrative disorders including lymphoma.  -Acute hypokalemia improved Replaced potassium orally  -Hypertension with tachycardia Continue Cardizem CD along with lisinopril  -History of CVA Continue aspirin  -DVT prophylaxis subcu Lovenox daily  -Leukocytosis secondary to endocarditis Follow-up WBC count    All the records are reviewed and case discussed with Care Management/Social Worker. Management plans discussed with the patient, family and they are in agreement.  CODE STATUS: Full code  DVT Prophylaxis: SCDs  TOTAL TIME TAKING CARE OF THIS PATIENT: 32 minutes.   POSSIBLE D/C IN 2 to 3 DAYS, DEPENDING ON CLINICAL CONDITION.  Saundra Shelling M.D on 10/21/2018 at 11:36 AM  Between 7am to 6pm - Pager - [phone]  After 6pm go to [email or web address] - password [email or web address] [facility]  Office  [phone]  CC: Primary care physician; Leone Haven, MD  Note: This dictation was prepared with Dragon dictation along with smaller phrase technology. Any transcriptional errors that result from this process are unintentional.

## 2018-10-22 ENCOUNTER — Other Ambulatory Visit (HOSPITAL_COMMUNITY)
Admission: RE | Admit: 2018-10-22 | Disposition: A | Discharge status: Home / Self Care | Payer: Medicare HMO | Source: Ambulatory Visit | Attending: Hematology and Oncology | Admitting: Hematology and Oncology

## 2018-10-22 ENCOUNTER — Inpatient Hospital Stay: Payer: Medicare HMO

## 2018-10-22 LAB — AMMONIA: AMMONIA: 20 umol/L (ref 9–35)

## 2018-10-22 LAB — CBC WITH DIFFERENTIAL/PLATELET
Abs Immature Granulocytes: 1.94 10*3/uL — ABNORMAL HIGH (ref 0.00–0.07)
Basophils Absolute: 0.1 10*3/uL (ref 0.0–0.1)
Basophils Relative: 0 %
Eosinophils Absolute: 0.7 10*3/uL — ABNORMAL HIGH (ref 0.0–0.5)
Eosinophils Relative: 3 %
HCT: 27.5 % — ABNORMAL LOW (ref 39.0–52.0)
Hemoglobin: 8.7 g/dL — ABNORMAL LOW (ref 13.0–17.0)
Immature Granulocytes: 10 %
Lymphocytes Relative: 9 %
Lymphs Abs: 1.8 10*3/uL (ref 0.7–4.0)
MCH: 29.5 pg (ref 26.0–34.0)
MCHC: 31.6 g/dL (ref 30.0–36.0)
MCV: 93.2 fL (ref 80.0–100.0)
Monocytes Absolute: 2.4 10*3/uL — ABNORMAL HIGH (ref 0.1–1.0)
Monocytes Relative: 12 %
Neutro Abs: 13.5 10*3/uL — ABNORMAL HIGH (ref 1.7–7.7)
Neutrophils Relative %: 66 %
Platelets: 204 10*3/uL (ref 150–400)
RBC: 2.95 MIL/uL — ABNORMAL LOW (ref 4.22–5.81)
RDW: 15.6 % — ABNORMAL HIGH (ref 11.5–15.5)
WBC: 20.3 10*3/uL — ABNORMAL HIGH (ref 4.0–10.5)
nRBC: 0.1 % (ref 0.0–0.2)

## 2018-10-22 LAB — GLUCOSE, CAPILLARY
GLUCOSE-CAPILLARY: 173 mg/dL — AB (ref 70–99)
GLUCOSE-CAPILLARY: 167 mg/dL — AB (ref 70–99)
GLUCOSE-CAPILLARY: 165 mg/dL — AB (ref 70–99)
Glucose-Capillary: 151 mg/dL — ABNORMAL HIGH (ref 70–99)

## 2018-10-22 LAB — KAPPA/LAMBDA LIGHT CHAINS
Kappa free light chain: 27.7 mg/L — ABNORMAL HIGH (ref 3.3–19.4)
Kappa, lambda light chain ratio: 0.81 (ref 0.26–1.65)
Lambda free light chains: 34.1 mg/L — ABNORMAL HIGH (ref 5.7–26.3)

## 2018-10-22 MED ORDER — METOPROLOL TARTRATE 25 MG PO TABS
25.0000 mg | ORAL_TABLET | Freq: Two times a day (BID) | ORAL | Status: DC
  Administered 2018-10-22 – 2018-10-25 (×7): 25 mg via ORAL
  Filled 2018-10-22 (×7): qty 1

## 2018-10-22 MED ORDER — VANCOMYCIN HCL IN DEXTROSE 1-5 GM/200ML-% IV SOLN
1000.0000 mg | Freq: Once | INTRAVENOUS | Status: AC
  Administered 2018-10-22: 1000 mg via INTRAVENOUS
  Filled 2018-10-22: qty 200

## 2018-10-22 MED ORDER — VANCOMYCIN HCL IN DEXTROSE 1-5 GM/200ML-% IV SOLN
1000.0000 mg | Freq: Two times a day (BID) | INTRAVENOUS | Status: DC
  Administered 2018-10-22 – 2018-10-25 (×6): 1000 mg via INTRAVENOUS
  Filled 2018-10-22 (×8): qty 200

## 2018-10-22 NOTE — Care Management Note (Signed)
Case Management Note  Patient Details  Name: Richard Gordon MRN: [id number] Date of Birth: May 28, 1951  Subjective/Objective:   Patient is from home; lives alone.  Admitted with rule out endocarditis; on long term antibiotics.  He is alert and oriented x 4.  Denies issues accessing medications or with transportation.  He drives himself to appointments and was previously independent in all adl's.  Current with PCP.  Uses a cane as needed.  His prescriptions are mailed to him.  Physical therapy is recommending CIR. Mel Almond, [place] and this RNCM at bedside explaining the differences in CIR and SNF.  Patient states he would like to proceed with CIR. Called [facility] and left message to see if there is a bed available and if so to start insurance authorization.                    Action/Plan:   Expected Discharge Date:                  Expected Discharge Plan:  IP Rehab Facility  In-House Referral:  Clinical Social Work  Discharge planning Services  CM Consult  Post Acute Care Choice:    Choice offered to:     DME Arranged:    DME Agency:     HH Arranged:    [place] Agency:     Status of Service:  In process, will continue to follow  If discussed at Long Length of Stay Meetings, dates discussed:    Additional Comments:  [name], RN 10/22/2018, 12:30 PM

## 2018-10-22 NOTE — Care Management Important Message (Signed)
Copy of signed IM left with patient in room.

## 2018-10-22 NOTE — Clinical Social Work Note (Signed)
Clinical Social Work Assessment  Patient Details  Name: Richard Gordon MRN: [id number] Date of Birth: 1951/08/19  Date of referral:  10/22/18               Reason for consult:  Facility Placement                Permission sought to share information with:  Chartered certified accountant granted to share information::  Yes, Verbal Permission Granted  Name::      CIR VS. SNF   Agency::     Relationship::     Contact Information:     Housing/Transportation Living arrangements for the past 2 months:  Single Family Home Source of Information:  Patient Patient Interpreter Needed:  None Criminal Activity/Legal Involvement Pertinent to Current Situation/Hospitalization:  No - Comment as needed Significant Relationships:  Siblings Lives with:  Self Do you feel safe going back to the place where you live?  Yes Need for family participation in patient care:  Yes (Comment)  Care giving concerns:  Patient lives alone in [name] and his brother [name] is his primary support.    Social Worker assessment / plan:  Holiday representative ([place]) reviewed chart and noted that PT is recommending CIR. RN case Freight forwarder and CSW met with patient at bedside to discuss D/C plan. Per patient he lives alone in [place] and his brother [name] lives in [place]. Per patient he has no children and no HPOA. CSW explained the difference between CIR and SNF. CSW explained that [facility] will have to approve CIR. Patient prefers to go to CIR and is agreeable to look into SNF as a back up plan. CSW explained that if [facility] denies CIR then a SNF can start [place] authorization. CSW explained that [facility] would have to approve SNF as well. FL2 complete and faxed out. RN case manager will make referral to CIR. CSW will continue to follow and assist as needed.   Employment status:  Retired Nurse, adult PT Recommendations:  Inpatient [facility] / Referral to community resources:   [facility](CIR VS. SNF )  Patient/Family's Response to care:  Patient prefers to go to CIR but is okay with SNF as a back up plan.   Patient/Family's Understanding of and Emotional Response to Diagnosis, Current Treatment, and Prognosis:  Patient was very pleasant and thanked CSW for assistance.   Emotional Assessment Appearance:  Appears stated age Attitude/Demeanor/Rapport:    Affect (typically observed):  Accepting, Adaptable, Pleasant Orientation:  Oriented to Self, Oriented to Place, Oriented to  Time, Oriented to Situation Alcohol / Substance use:  Not Applicable Psych involvement (Current and /or in the community):  No (Comment)  Discharge Needs  Concerns to be addressed:  Discharge Planning Concerns Readmission within the last 30 days:  No Current discharge risk:  Dependent with Mobility Barriers to Discharge:  Continued Medical Work up   [employer], Veronia Beets, LCSW 10/22/2018, 2:19 PM

## 2018-10-22 NOTE — Progress Notes (Signed)
? Richard Gordon is a 67 y.o. male with a history of diabetes mellitus, hypertension, hyperlipidemia, CVA, CKD  a very vague historian has been back and forth to the hospital  l for the past few weeks with c/o not feeling well, generalized body ache, lower leg weakness and afraid that he would fall. He lives on his own  Initially he said it all started after he broke his tooth 4-5 weeks ago and then had a tooth extraction done a week ago and ever since has not felt well.  He has been on amoxicillin since 10/03/2018.  But a few minutes later he said he has not been well right from the time the tooth broke.  He never had pain at the tooth.  His main complaint this is not been able to walk very well because he feels weak in his legs.  He has poor appetite.  He says he has lost weight.  He has had some sweating.  He does not really complain of any fever.  In September he had gone to his PCP and was complaining of low back pain left leg pain which apparently is chronic but it had gotten worse at that time. Just before that he was seen in the ED on September 19 with chief complaints of generalized body aches feeling sore all over.  So not  really clear how long he  has been unwell.  He lives on his own.  He said before he walked well but of late has been walking using a cane and then a walker. Did not initially  complain of any retention of urine, difficulty passing urine or dysuria.  Does not have any diarrhea or incontinence of stool.   has some nausea and poor appetite.  He has no travel history.  He does not have any pets.  He does not smoke nor drink alcohol.   He came to the ED on 10/07/2018 and was discharged home after getting IV fluids in the hospital.  On 10/07/2018 his WBC count was 15.3.    Came back on 10/09/2018 complaining of body aches for 2 months.  Even though admission his temperature was 98.5 it did go up to 103 within a few hours.  He had a WBC count of 27.  He had increased lactate.  Blood  cultures were sent and he was admitted as sepsis and started on IV Vanco cefepime and Flagyl.  He had a chest x-ray on admission which did not show any cardiopulmonary disease.  The next day had a CT abdomen and pelvis was done and it showed Soft tissue stranding is identified in the retroperitoneal space of the abdomen, diffusely around the pancreas, adrenal glands, and kidneys. These changes are fairly evenly distributed without a definite epicenter making the appearance indeterminate. Changes around the pancreas could be related to pancreatitis and the perirenal edema could reflect pyelonephritis. Bilateral symmetric periadrenal edema/stranding is of uncertain significance.   No history of travel .  No activity like hunting or fishing or water sports.  No animal bites, tick bites or other insect bites  I saw him during that admission and was concerned for endocarditis or a vertebral infection and asked for echo and TEE. Blood culture was negative But pt over the weekend was discharged home (10/14/18)  ( he asked to go ) cefdinir and flagyl. He returned within 48 hrs on 10/16/18 with stiffness legs and pain legs while walking. He drove himself to the ED .His WBC was  31 and he had low grade fever  He says he has been having low back pain and said it was due to drinking mountain Dew. TEE was done yesterday  and the report is probable aortic valve vegetation with no measurement given Noted to be peeing on himself  Subjective C/o weakness legs    Objective:  VITALS:  BP (!) 141/59 (BP Location: Left Arm)   Pulse (!) 115   Temp 99.6 F (37.6 C) (Oral)   Resp 18   Ht 5' 10" (1.778 m)   Wt 66.3 kg   SpO2 94%   BMI 20.98 kg/m  PHYSICAL EXAM:  General: Alert, cooperative, no distress, appears stated age. pale Head: Normocephalic, without obvious abnormality, atraumatic. Eyes: Conjunctivae clear, anicteric sclerae. Pupils are equal ENT Nares normal. No drainage or sinus  tenderness. Lips, mucosa, and tongue normal. No Thrush Neck: Supple, symmetrical, no adenopathy, thyroid: non tender no carotid bruit and no JVD. Back: No CVA tenderness. Lungs: Clear to auscultation bilaterally. No Wheezing or Rhonchi. No rales. Heart: Regular rate and rhythm, no murmur, rub or gallop. Abdomen: Soft, non-tender,not distended. Bowel sounds normal. No masses Extremities: atraumatic, no cyanosis. No edema. No clubbing Skin: No rashes or lesions. Or bruising Lymph: Cervical, supraclavicular normal. Neurologic: Grossly non-focal Pertinent Labs Lab Results CBC    Component Value Date/Time   WBC 20.3 (H) 10/22/2018 0721   RBC 2.95 (L) 10/22/2018 0721   HGB 8.7 (L) 10/22/2018 0721   HGB 14.6 07/10/2013 2023   HCT 27.5 (L) 10/22/2018 0721   HCT 40.2 07/10/2013 2023   PLT 204 10/22/2018 0721   PLT 241 07/10/2013 2023   MCV 93.2 10/22/2018 0721   MCV 93 07/10/2013 2023   MCH 29.5 10/22/2018 0721   MCHC 31.6 10/22/2018 0721   RDW 15.6 (H) 10/22/2018 0721   RDW 12.9 07/10/2013 2023   LYMPHSABS 1.8 10/22/2018 0721   MONOABS 2.4 (H) 10/22/2018 0721   EOSABS 0.7 (H) 10/22/2018 0721   BASOSABS 0.1 10/22/2018 0721   CBC Latest Ref Rng & Units 10/22/2018 10/21/2018 10/20/2018  WBC 4.0 - 10.5 K/uL 20.3(H) 18.6(H) 14.9(H)  Hemoglobin 13.0 - 17.0 g/dL 8.7(L) 9.3(L) 8.9(L)  Hematocrit 39.0 - 52.0 % 27.5(L) 29.7(L) 28.0(L)  Platelets 150 - 400 K/uL 204 206 173   CMP Latest Ref Rng & Units 10/21/2018 10/20/2018 10/19/2018  Glucose 70 - 99 mg/dL 102(H) 79 113(H)  BUN 8 - 23 mg/dL _0 Creatinine 0.61 - 1.24 mg/dL 1.06 0.96 0.93  Sodium 135 - 145 mmol/L 137 136 136  Potassium 3.5 - 5.1 mmol/L 3.7 3.2(L) 3.5  Chloride 98 - 111 mmol/L 101 101 100  CO2 22 - 32 mmol/L _1 Calcium 8.9 - 10.3 mg/dL 8.1(L) 8.0(L) 7.8(L)  Total Protein 6.5 - 8.1 g/dL - 5.7(L) 5.7(L)  Total Bilirubin 0.3 - 1.2 mg/dL - 0.8 0.8  Alkaline Phos 38 - 126 U/L - 216(H) 234(H)  AST 15 - 41 U/L  - 16 15  ALT 0 - 44 U/L - 14 15      Microbiology: Recent Results (from the past 240 hour(s))  Culture, blood (routine x 2)     Status: None   Collection Time: 10/16/18  3:23 PM  Result Value Ref Range Status   Specimen Description BLOOD RIGHT ANTECUBITAL  Final   Special Requests   Final    BOTTLES DRAWN AEROBIC AND ANAEROBIC Blood Culture adequate volume   Culture   Final  NO GROWTH 5 DAYS Performed at [facility], [address] [place], [place] [address]    Report Status 10/21/2018 FINAL  Final  Culture, blood (routine x 2)     Status: None   Collection Time: 10/16/18  3:24 PM  Result Value Ref Range Status   Specimen Description BLOOD BLOOD LEFT HAND  Final   Special Requests   Final    BOTTLES DRAWN AEROBIC AND ANAEROBIC Blood Culture results may not be optimal due to an excessive volume of blood received in culture bottles   Culture   Final    NO GROWTH 5 DAYS Performed at [facility], [address] [place], [place] [address]    Report Status 10/21/2018 FINAL  Final   IMAGING RESULTS: ? Impression/Recommendation ?67 y.o. male with a history of diabetes mellitus, hypertension, hyperlipidemia presents with not feeling well for the past few weeks. Initially he said it all started after he broke his tooth 4-5 weeks ago and then had a tooth extraction done a week ago and ever since has not felt well.  He has been on antibiotics after the tooth extraction he is taking amoxicillin since 10/03/2018.  But a few minutes later he said he has not been well right from the time the tooth broke.  He never had pain at the tooth.  His main complaint this is not been able to walk very well because he feels weak in his legs.  He has poor appetite.  He says he has lost weight.  He has had some sweating.  He does not really complain of any fever.  In September he had gone to his PCP and was complaining of low back pain left leg pain which apparently is chronic but it  had gotten worse at that time. Just before that he was seen in the ED on September 19 with chief complaints of generalized body aches feeling sore all over.  So not  really clear how long he  has been unwell.  He lives on his own.  He said before he walked well but of late has been walking using a cane and then a walker.  Fever and leukocytosis.  Along with fatigue and weakness for few weeks, anemia.  History of tooth issue. Aortic valve endocarditis on TEE.- culture negative    Blood culture has been negative so far -- because he was on  amoxicillin before he presented this could be negative from the antibiotic effect. He did have CT of the maxilla and that has shown multiple periapical abscesses but I am not sure whether that is going to cause this high white count.  There was no obvious collection which was noted in the maxillary scan.. vanco cefepime flagyl was narrowed to ceftriaxone but the WBC is increasing again. Will restart vanco today.if wbc still no change then will change ceftriaxone to cefepime   The CT abdomen and pelvis showed diffuse soft tissue stranding in the retroperitoneal space of the abdomen around the pancreas adrenal glands and kidneys.  Unclear what this process is ?  Marland Kitchen Because of complains of weakness in his legs and inability to walk he got MRI of the Thoraco/lumbar spine . It shows no infection but  Diffusely abnormal bone marrow signal within the thoracolumbar and included sacral spine with heterogeneous enhancement highly concerning for infiltrative tumor (infiltrative metastatic disease versus myeloproliferative disorder or leukemia/lymphoma). No pathologic fracture. Atypical appearance for infection.  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 Old small LEFT parietal/MCA territory infarct. Old RIGHT thalamus infarct.4. LEFT inferior frontal lobe encephalomalacia  most compatible with neonatal insult/TBI.   Anemia since November 2019. .?  Anemia secondary to infection versus other cause.  Increase in alkaline phophatase-   ? Bone  ?  CKD- much improved  Diabetes mellitus   Need post void bladder scanto look for residue  Hypertension management as per primary team. ? ? ___________________________________________________ Discussed with Dr.Reddy

## 2018-10-22 NOTE — Clinical Social Work Placement (Signed)
CLINICAL SOCIAL WORK PLACEMENT  NOTE  Date:  10/22/2018  Patient Details  Name: KODA ROUTON MRN: [id number] Date of Birth: 1951/07/02  Clinical Social Work is seeking post-discharge placement for this patient at the [place] level of care (*CSW will initial, date and re-position this form in  chart as items are completed):  Yes   Patient/family provided with [facility] Work Department's list of facilities offering this level of care within the geographic area requested by the patient (or if unable, by the patient's family).  Yes   Patient/family informed of their freedom to choose among providers that offer the needed level of care, that participate in Medicare, Medicaid or managed care program needed by the patient, have an available bed and are willing to accept the patient.  Yes   Patient/family informed of [facility]'s ownership interest in [facility] and [facility], as well as of the fact that they are under no obligation to receive care at these facilities.  PASRR submitted to EDS on 10/22/18     PASRR number received on 10/22/18     Existing PASRR number confirmed on       FL2 transmitted to all facilities in geographic area requested by pt/family on 10/22/18     FL2 transmitted to all facilities within larger geographic area on       Patient informed that his/her managed care company has contracts with or will negotiate with certain facilities, including the following:            Patient/family informed of bed offers received.  Patient chooses bed at       Physician recommends and patient chooses bed at      Patient to be transferred to   on  .  Patient to be transferred to facility by       Patient family notified on   of transfer.  Name of family member notified:        PHYSICIAN       Additional Comment:    _______________________________________________ Elijah Michaelis, Veronia Beets, LCSW 10/22/2018, 2:18 PM

## 2018-10-22 NOTE — Plan of Care (Signed)
  Problem: Safety: Goal: Ability to remain free from injury will improve Outcome: Progressing  Pt has telesitter

## 2018-10-22 NOTE — Plan of Care (Signed)
  Problem: Health Behavior/Discharge Planning: Goal: Ability to manage health-related needs will improve Outcome: Not Progressing Note:  Patient yanked P.I.C.C. line completely out this morning, for reasons unknown. Informed physician. No plans for new access as of yet. Will continue to monitor for any developments. Wenda Low [facility]

## 2018-10-22 NOTE — NC FL2 (Signed)
[place] LEVEL OF CARE SCREENING TOOL     IDENTIFICATION  Patient Name: Richard Gordon Birthdate: 01-Jan-1951 Sex: male Admission Date (Current Location): 10/16/2018  [place] and [place] Number:  Engineering geologist and Address:  [facility], [address] [place], [place] [address]      Provider Number: [id number]  Attending Physician Name and Address:  Saundra Shelling, MD  Relative Name and Phone Number:       Current Level of Care: Hospital Recommended Level of Care: [place] Prior Approval Number:    Date Approved/Denied:   PASRR Number: [id number])  Discharge Plan: SNF    Current Diagnoses: Patient Active Problem List   Diagnosis Date Noted  . Endocarditis 10/16/2018  . Sepsis ([place]) 10/09/2018  . Decreased energy 07/09/2018  . Thrombocytopenia ([place]) 04/01/2018  . Syncope 03/09/2018  . AD (atopic dermatitis) 11/03/2015  . Chronic kidney disease (CKD), stage III (moderate) ([place]) 11/03/2015  . Personal history of stroke with residual effects 11/03/2015  . Hyperlipidemia associated with type 2 diabetes mellitus ([place]) 07/09/2015  . Hypertension 07/09/2015  . DM type 2 with diabetic peripheral neuropathy ([place]) 05/22/2015    Orientation RESPIRATION BLADDER Height & Weight     Self, Time, Situation, Place  Normal Continent Weight: 146 lb 3.2 oz (66.3 kg) Height:  5' 10" (177.8 cm)  BEHAVIORAL SYMPTOMS/MOOD NEUROLOGICAL BOWEL NUTRITION STATUS      Continent Diet(Diet: Heart Healthy)  AMBULATORY STATUS COMMUNICATION OF NEEDS Skin   Extensive Assist Verbally Normal                       Personal Care Assistance Level of Assistance  Bathing, Feeding, Dressing Bathing Assistance: Limited assistance Feeding assistance: Independent Dressing Assistance: Limited assistance     Functional Limitations Info  Sight, Hearing, Speech Sight Info: Impaired Hearing Info: Adequate Speech Info: Adequate     SPECIAL CARE FACTORS FREQUENCY  PT (By licensed PT), OT (By licensed OT)     PT Frequency: (5) OT Frequency: (5)            Contractures      Additional Factors Info  Code Status, Allergies Code Status Info: (Full Code. ) Allergies Info: (No Known Allergies. )           Current Medications (10/22/2018):  This is the current hospital active medication list Current Facility-Administered Medications  Medication Dose Route Frequency Provider Last Rate Last Dose  . 0.9 %  sodium chloride infusion  250 mL Intravenous PRN Salary, Montell D, MD 10 mL/hr at 10/22/18 0700    . 0.9 %  sodium chloride infusion   Intravenous Continuous Lequita Asal, MD   Stopped at 10/22/18 [phone]  . acetaminophen (TYLENOL) tablet 650 mg  650 mg Oral Q6H PRN Salary, Montell D, MD   650 mg at 10/18/18 2345   Or  . acetaminophen (TYLENOL) suppository 650 mg  650 mg Rectal Q6H PRN Salary, Montell D, MD      . aspirin EC tablet 81 mg  81 mg Oral Daily Loletha Grayer, MD   81 mg at 10/22/18 0942  . bisacodyl (DULCOLAX) EC tablet 5 mg  5 mg Oral Daily PRN Loney Hering D, MD   5 mg at 10/19/18 2102  . cefTRIAXone (ROCEPHIN) 2 g in sodium chloride 0.9 % 100 mL IVPB  2 g Intravenous Q24H Tsosie Billing, MD   Stopped at 10/21/18 1627  . diltiazem (CARDIZEM) tablet 30  mg  30 mg Oral Q8H Salary, Montell D, MD   30 mg at 10/22/18 1333  . docusate sodium (COLACE) capsule 100 mg  100 mg Oral BID Loney Hering D, MD   100 mg at 10/22/18 0942  . enoxaparin (LOVENOX) injection 40 mg  40 mg Subcutaneous Q24H Loletha Grayer, MD   40 mg at 10/21/18 2151  . feeding supplement (GLUCERNA SHAKE) (GLUCERNA SHAKE) liquid 237 mL  237 mL Oral TID BM Salary, Montell D, MD   237 mL at 10/22/18 0944  . guaiFENesin-dextromethorphan (ROBITUSSIN DM) 100-10 MG/5ML syrup 5 mL  5 mL Oral Q4H PRN Loletha Grayer, MD   5 mL at 10/20/18 0609  . hydrALAZINE (APRESOLINE) injection 10 mg  10 mg Intravenous Q4H PRN Salary,  Holly Bodily D, MD   10 mg at 10/21/18 0538  . HYDROcodone-acetaminophen (NORCO/VICODIN) 5-325 MG per tablet 1-2 tablet  1-2 tablet Oral Q4H PRN Gorden Harms, MD   2 tablet at 10/21/18 0535  . insulin aspart (novoLOG) injection 0-15 Units  0-15 Units Subcutaneous TID WC Salary, Montell D, MD   3 Units at 10/22/18 1144  . insulin aspart (novoLOG) injection 0-5 Units  0-5 Units Subcutaneous QHS Salary, Montell D, MD      . insulin glargine (LANTUS) injection 20 Units  20 Units Subcutaneous QHS Loney Hering D, MD   20 Units at 10/21/18 2151  . lisinopril (PRINIVIL,ZESTRIL) tablet 20 mg  20 mg Oral Daily Salary, Montell D, MD   20 mg at 10/22/18 0942  . metoprolol tartrate (LOPRESSOR) tablet 12.5 mg  12.5 mg Oral BID Loletha Grayer, MD   12.5 mg at 10/22/18 0943  . ondansetron (ZOFRAN) tablet 4 mg  4 mg Oral Q6H PRN Salary, Montell D, MD       Or  . ondansetron (ZOFRAN) injection 4 mg  4 mg Intravenous Q6H PRN Salary, Montell D, MD      . polyethylene glycol (MIRALAX / GLYCOLAX) packet 17 g  17 g Oral Daily PRN Salary, Montell D, MD      . sodium chloride flush (NS) 0.9 % injection 10-40 mL  10-40 mL Intracatheter PRN Pyreddy, Pavan, MD      . sodium chloride flush (NS) 0.9 % injection 3 mL  3 mL Intravenous Q12H Salary, Montell D, MD   3 mL at 10/21/18 1030  . sodium chloride flush (NS) 0.9 % injection 3 mL  3 mL Intravenous PRN Salary, Montell D, MD      . sodium phosphate (FLEET) 7-19 GM/118ML enema 1 enema  1 enema Rectal Once PRN Salary, Montell D, MD      . traZODone (DESYREL) tablet 50 mg  50 mg Oral QHS PRN Salary, Holly Bodily D, MD   50 mg at 10/21/18 2151  . vancomycin (VANCOCIN) IVPB 1000 mg/200 mL premix  1,000 mg Intravenous Once Ramond Dial, [facility]         Discharge Medications: Please see discharge summary for a list of discharge medications.  Relevant Imaging Results:  Relevant Lab Results:   Additional Information (SSN: [id number])  Kert Shackett, Veronia Beets, LCSW

## 2018-10-22 NOTE — Progress Notes (Signed)
Rehab Admissions Coordinator Note:  Patient was screened by Retta Diones for appropriateness for an Inpatient Acute Rehab Consult. Noted PT/OT recommending inpatient rehab admission.  My partner, Jhonnie Garner, will follow up and see patient in am for potential acute inpatient rehab admission.  Call her for questions at [phone]  Retta Diones 10/22/2018, 2:27 PM  I can be reached at [phone].

## 2018-10-22 NOTE — Progress Notes (Signed)
Occupational Therapy Treatment Patient Details Name: Richard Gordon MRN: [id number] DOB: August 12, 1951 Today's Date: 10/22/2018    History of present illness Pt is a 67 y/o M who presented with stiff legs, difficulty walking, weakness, fatigue.  Pt admitted for endocarditis.  Recent imaging suggestive of possible neurodegenerative disorder vs hypermetabolic state/metastatic disease.  Pts PMH includes stroke x2, syncope.    OT comments  Pt seen for OT treatment this date.  Pt presented in bed throughout session. Pt ambulated to bathroom with hand held assit with nursing immediately before session, with HR up to 139. HR between 95-100 throughout OT session. Administered MOCA; pt was eager to complete assessment and demonstrated focus and attention to task throughout. Pt unable to complete visuospatial task with VC and task modifications however not showing visible frustration. Pt required directions to be repeated x2 for certain sections.  Pt asked for immediate feedback after each task. Pt presented with global cognitive deficits including visuospatial/executive functioning, short term memory, attn, orientation, abstraction and delayed recall scoring 6/30.  Pt also demonstrated impaired higher level problem solving skills and safety/deficit awareness. Change in cognition noted since previous session, will notify MD. Pt would benefit from skilled OT to address noted impairments and functional limitations (see below for any additional details) in order to maximize safety and independence while minimizing falls risk and caregiver burden.    Follow Up Recommendations  CIR    Equipment Recommendations       Recommendations for Other Services      Precautions / Restrictions Precautions Precautions: Fall Precaution Comments: monitor HR Restrictions Weight Bearing Restrictions: No       Mobility Bed Mobility                  Transfers                      Balance                                            ADL either performed or assessed with clinical judgement   ADL                                               Vision       Perception     Praxis      Cognition Arousal/Alertness: Awake/alert Behavior During Therapy: WFL for tasks assessed/performed Overall Cognitive Status: Impaired/Different from baseline Area of Impairment: Orientation;Attention;Memory;Safety/judgement;Problem solving                 Orientation Level: Disoriented to;Person Current Attention Level: Focused;Selective Memory: Decreased short-term memory   Safety/Judgement: Decreased awareness of safety;Decreased awareness of deficits   Problem Solving: Slow processing;Difficulty sequencing;Requires verbal cues General Comments: Administered MOCA; pt presented with global cognitive deficits including visuospatial/executive functioning, short term memory, attn, orientation, abstraction and delayed recall scoring 6/30. Pt demonstrated impaired higher level problem solving skills.        Exercises Other Exercises Other Exercises: Pt educated in energy conservation techniques for ADL and IADL tasks.  Other Exercises: Pt educated in safety awareness strategies to use during ADL/IADL tasks.   Shoulder Instructions       General Comments      Pertinent Vitals/ Pain  Pain Assessment: No/denies pain Pain Intervention(s): Monitored during session  Home Living                                          Prior Functioning/Environment              Frequency  Min 3X/week        Progress Toward Goals  OT Goals(current goals can now be found in the care plan section)  Progress towards OT goals: Progressing toward goals  Acute Rehab OT Goals Patient Stated Goal: get back home Time For Goal Achievement: 11/05/18 Potential to Achieve Goals: Good  Plan Discharge plan remains appropriate;Frequency remains  appropriate    Co-evaluation                 AM-PAC PT "6 Clicks" Daily Activity     Outcome Measure   Help from another person eating meals?: None Help from another person taking care of personal grooming?: None Help from another person toileting, which includes using toliet, bedpan, or urinal?: A Lot Help from another person bathing (including washing, rinsing, drying)?: A Little Help from another person to put on and taking off regular upper body clothing?: None Help from another person to put on and taking off regular lower body clothing?: A Little 6 Click Score: 20    End of Session    OT Visit Diagnosis: Muscle weakness (generalized) (M62.81);Other abnormalities of gait and mobility (R26.89)   Activity Tolerance Patient tolerated treatment well   Patient Left in bed;with call bell/phone within reach;with bed alarm set   Nurse Communication          Time: 2952-8413 OT Time Calculation (min): 39 min  Charges:    Jadene Pierini OTS   10/22/2018, 12:58 PM

## 2018-10-22 NOTE — Consult Note (Signed)
Pharmacy Antibiotic Note  Richard Gordon is a 67 y.o. male admitted on 10/16/2018 with sepsis.  Pharmacy has been consulted for vancomycin dosing.  Plan: Pharmacy consulted to resume vancomycin dosing. Patient was previously on 1g q 12 with therapeutic trough. renal function stable. Last dose of vanc was Friday 11/15. Will give 1g now and start 1g q 12 in 6 hours for stacked dosing. Trough prior to the 5th dose  Height: 5' 10" (177.8 cm) Weight: 146 lb 3.2 oz (66.3 kg) IBW/kg (Calculated) : 73  Temp (24hrs), Avg:98.7 F (37.1 C), Min:98.1 F (36.7 C), Max:99.6 F (37.6 C)  Recent Labs  Lab 10/16/18 1523 10/16/18 1719 10/17/18 0528 10/18/18 1130 10/19/18 0529 10/20/18 0538 10/21/18 0459 10/22/18 0721  WBC  --  23.9* 20.4*  --  14.3* 14.9* 18.6* 20.3*  CREATININE  --  1.14 0.98  --  0.93 0.96 1.06  --   LATICACIDVEN 1.4 1.1  --   --   --   --   --   --   VANCOTROUGH  --   --   --  17  --   --   --   --     Estimated Creatinine Clearance: 62.5 mL/min (by C-G formula based on SCr of 1.06 mg/dL).    No Known Allergies  Antimicrobials this admission: vanc 11/12>>11/15, 11/18>> Cefepime 11/12>>11/14 Flagyl 11/12>> Ceftriaxone 11/14>>  Dose adjustments this admission:   Microbiology results: 11/12 BCx: NG 11/05 UCx: NG    Thank you for allowing pharmacy to be a part of this patient's care.  Ramond Dial, Pharm.D, BCPS Clinical Pharmacist 10/22/2018 2:10 PM

## 2018-10-22 NOTE — Progress Notes (Signed)
Inpatient Rehabilitation-Admissions Coordinator   I have placed an IP Rehab Consult Order in the chart as a means to follow pt. I plan to see the patient tomorrow morning around 7AM to discuss CIR program and continue to assess appropriateness.   Please call if questions.   Jhonnie Garner, OTR/L  Rehab Admissions Coordinator  [phone] 10/22/2018 7:00 PM

## 2018-10-22 NOTE — Progress Notes (Addendum)
[place] [facility] [place] NAME: Richard Gordon    MR#:  [id number]  DATE OF BIRTH:  24-Nov-1951  SUBJECTIVE:  CHIEF COMPLAINT:   Chief Complaint  Patient presents with  . Muscle Pain  Patient seen and evaluated today Patient pulled out PICC line yesterday Bleeding has stopped Does not know why he pulled out the PICC line, could not give a valid reason Tolerating diet well No nausea or vomiting  REVIEW OF SYSTEMS:    ROS  CONSTITUTIONAL: No documented fever.  Has fatigue, weakness. No weight gain, no weight loss.  EYES: No blurry or double vision.  ENT: No tinnitus. No postnasal drip. No redness of the oropharynx.  RESPIRATORY: No cough, no wheeze, no hemoptysis. No dyspnea.  CARDIOVASCULAR: No chest pain. No orthopnea. No palpitations. No syncope.  GASTROINTESTINAL: No nausea, no vomiting or diarrhea. No abdominal pain. No melena or hematochezia.  GENITOURINARY: No dysuria or hematuria.  ENDOCRINE: No polyuria or nocturia. No heat or cold intolerance.  HEMATOLOGY: No anemia. No bruising. No bleeding.  INTEGUMENTARY: No rashes. No lesions.  MUSCULOSKELETAL: No arthritis. No swelling. No gout.  NEUROLOGIC: No numbness, tingling, or ataxia. No seizure-type activity.  PSYCHIATRIC: No anxiety. No insomnia. No ADD.   DRUG ALLERGIES:  No Known Allergies  VITALS:  Blood pressure (!) 141/59, pulse (!) 115, temperature 99.6 F (37.6 C), temperature source Oral, resp. rate 18, height 5' 10" (1.778 m), weight 66.3 kg, SpO2 94 %.  PHYSICAL EXAMINATION:   Physical Exam  GENERAL:  67 y.o.-year-old patient lying in the bed with no acute distress.  EYES: Pupils equal, round, reactive to light and accommodation. No scleral icterus. Extraocular muscles intact.  HEENT: Head atraumatic, normocephalic. Oropharynx and nasopharynx clear.  NECK:  Supple, no jugular venous distention. No thyroid enlargement, no tenderness.  LUNGS: Normal breath sounds  bilaterally, no wheezing, rales, rhonchi. No use of accessory muscles of respiration.  CARDIOVASCULAR: S1, S2 normal. No murmurs, rubs, or gallops.  ABDOMEN: Soft, nontender, nondistended. Bowel sounds present. No organomegaly or mass.  EXTREMITIES: No cyanosis, clubbing or edema b/l.    NEUROLOGIC: Cranial nerves II through XII are intact. No focal Motor or sensory deficits b/l.   PSYCHIATRIC: The patient is alert and oriented x 3.  SKIN: No obvious rash, lesion, or ulcer.   LABORATORY PANEL:   CBC Recent Labs  Lab 10/22/18 0721  WBC 20.3*  HGB 8.7*  HCT 27.5*  PLT 204   ------------------------------------------------------------------------------------------------------------------ Chemistries  Recent Labs  Lab 10/17/18 0528  10/20/18 0538 10/21/18 0459  NA 136   < > 136 137  K 3.3*   < > 3.2* 3.7  CL 103   < > 101 101  CO2 23   < > 27 25  GLUCOSE 131*   < > 79 102*  BUN 13   < > 15 19  CREATININE 0.98   < > 0.96 1.06  CALCIUM 7.6*   < > 8.0* 8.1*  MG 1.7  --   --   --   AST  --    < > 16  --   ALT  --    < > 14  --   ALKPHOS  --    < > 216*  --   BILITOT  --    < > 0.8  --    < > = values in this interval not displayed.   ------------------------------------------------------------------------------------------------------------------  Cardiac Enzymes Recent Labs  Lab 10/16/18 1236  TROPONINI <0.03   ------------------------------------------------------------------------------------------------------------------  RADIOLOGY:  No results found.   ASSESSMENT AND PLAN:  67 year old male patient with history of diabetes mellitus type 2, hypertension, CVA currently under hospitalist service for endocarditis  -Delirium versus encephalopathy Periods of confusion Check CT head Check ammonia level Neurology consult  -Aortic valve endocarditis Status post infectious disease consultation and follow-up Continue IV Rocephin antibiotic Put peripheral line for  now Counseling given for the patient regarding the PICC line and its usage Patient will need antibiotics for 6 weeks duration of total antibiotic  Blood cultures no growth so far  -Lumbosacral spine infiltrating lesion versus metastatic disease Appreciate oncology evaluation Bone marrow biopsy today for further evaluation Appreciate oncology follow-up  CT chest shows Diffuse/heterogeneous osseous sclerosis involving the visualized axial and appendicular skeleton. Differential considerations include renal osteodystrophy, diffuse osseous metastases secondary to malignancy (most commonly prostate cancer), or infiltrative disorders including lymphoma.  -Acute hypokalemia improved Replaced potassium orally  -Hypertension with tachycardia Continue Cardizem CD along with lisinopril  -History of CVA Continue aspirin  -DVT prophylaxis subcu Lovenox daily  -Leukocytosis secondary to endocarditis worsening Infectious disease follow-up done Restart IV vancomycin and continue IV ceftriaxone abx Follow-up WBC count  All the records are reviewed and case discussed with Care Management/Social Worker. Management plans discussed with the patient, family and they are in agreement.  CODE STATUS: Full code  DVT Prophylaxis: SCDs  TOTAL TIME TAKING CARE OF THIS PATIENT: 33 minutes.   POSSIBLE D/C IN 2 to 3 DAYS, DEPENDING ON CLINICAL CONDITION.  Saundra Shelling M.D on 10/22/2018 at 1:11 PM  Between 7am to 6pm - Pager - [phone]  After 6pm go to [email or web address] - password [email or web address] [facility] [facility]  Office  [phone]  CC: Primary care physician; Leone Haven, MD  Note: This dictation was prepared with Dragon dictation along with smaller phrase technology. Any transcriptional errors that result from this process are unintentional.

## 2018-10-22 NOTE — Progress Notes (Signed)
Patient just pulled out his P.I.C.C. line, blood everywhere. When asked why he did it, he says "just to do it", and wanted to see "how much it would bleed". Gown and bedding to be changed, pressure held over insertion site by other RN for several minutes. Gauze pad and tape applied. Paged physician to inform. Will text if no response. Will continue to monitor. Wenda Low [facility]

## 2018-10-22 NOTE — Progress Notes (Signed)
Physical Therapy Treatment Patient Details Name: Richard Gordon MRN: [id number] DOB: 1951-02-15 Today's Date: 10/22/2018    History of Present Illness Pt is a 67 y/o M who presented with stiff legs, difficulty walking, weakness, fatigue.  Pt admitted for endocarditis.  Recent imaging suggestive of possible neurodegenerative disorder vs hypermetabolic state/metastatic disease.  Pts PMH includes stroke x2, syncope.     PT Comments    Pt agreeable to PT; reports BLE pain with no further description/explanation. Pt participates in LE exercises requiring increases verbal and tactile cueing and occasional assist for improved technique. Pt mildly impulsive with movements in general. Pt had overall increased confusion today, but improved this afternoon from earlier staff observations. Continue PT to progress strength, endurance to improve functional mobility.    Follow Up Recommendations  CIR     Equipment Recommendations       Recommendations for Other Services       Precautions / Restrictions Precautions Precautions: Fall Precaution Comments: monitor HR Restrictions Weight Bearing Restrictions: No    Mobility  Bed Mobility               General bed mobility comments: Not tested due to increased confusion overall today  Transfers                    Ambulation/Gait                 Stairs             Wheelchair Mobility    Modified Rankin (Stroke Patients Only)       Balance                                            Cognition Arousal/Alertness: Awake/alert Behavior During Therapy: WFL for tasks assessed/performed;Impulsive(mildly impulsive) Overall Cognitive Status: Within Functional Limits for tasks assessed Area of Impairment: Orientation;Attention;Memory;Safety/judgement;Problem solving                 Orientation Level: Disoriented to;Person Current Attention Level: Focused;Selective Memory: Decreased  short-term memory   Safety/Judgement: Decreased awareness of safety;Decreased awareness of deficits   Problem Solving: Slow processing;Difficulty sequencing;Requires verbal cues General Comments: Requires increased verbal and tactile cueing to follow commands      Exercises General Exercises - Lower Extremity Ankle Circles/Pumps: AROM;Both;20 reps;Supine Quad Sets: Strengthening;Both;10 reps;Supine Short Arc Quad: AAROM;AROM;Both;Supine;15 reps Heel Slides: AROM;Both;Supine;15 reps Hip ABduction/ADduction: AROM;AAROM;Both;Supine;15 reps Straight Leg Raises: AROM;Strengthening;Both;10 reps;Supine;Other (comment)(2 sets) Other Exercises Other Exercises: Pt educated in energy conservation techniques for ADL and IADL tasks.  Other Exercises: Pt educated in safety awareness strategies to use during ADL/IADL tasks.    General Comments        Pertinent Vitals/Pain Pain Assessment: Faces Faces Pain Scale: Hurts little more Pain Location: BLEs Pain Intervention(s): Monitored during session    Home Living                      Prior Function            PT Goals (current goals can now be found in the care plan section) Acute Rehab PT Goals Patient Stated Goal: get back home Progress towards PT goals: (Assess next visit; increased confusion overall today)    Frequency    Min 2X/week      PT Plan Current plan remains appropriate    Co-evaluation  AM-PAC PT "6 Clicks" Daily Activity  Outcome Measure  Difficulty turning over in bed (including adjusting bedclothes, sheets and blankets)?: A Little Difficulty moving from lying on back to sitting on the side of the bed? : A Little Difficulty sitting down on and standing up from a chair with arms (e.g., wheelchair, bedside commode, etc,.)?: Unable Help needed moving to and from a bed to chair (including a wheelchair)?: A Lot Help needed walking in hospital room?: A Lot Help needed climbing 3-5 steps  with a railing? : Total 6 Click Score: 12    End of Session   Activity Tolerance: Patient tolerated treatment well Patient left: in bed;with call bell/phone within reach;with bed alarm set;Other (comment)(Tele sitter)   PT Visit Diagnosis: Unsteadiness on feet (R26.81);Other abnormalities of gait and mobility (R26.89);Muscle weakness (generalized) (M62.81);Other symptoms and signs involving the nervous system (R29.898);Difficulty in walking, not elsewhere classified (R26.2)     Time: 2956-2130 PT Time Calculation (min) (ACUTE ONLY): 16 min  Charges:  $Therapeutic Exercise: 8-22 mins                      Larae Grooms, PTA 10/22/2018, 4:06 PM

## 2018-10-23 ENCOUNTER — Inpatient Hospital Stay: Payer: Medicare HMO

## 2018-10-23 DIAGNOSIS — R4182 Altered mental status, unspecified: Secondary | ICD-10-CM

## 2018-10-23 DIAGNOSIS — D649 Anemia, unspecified: Secondary | ICD-10-CM | POA: Diagnosis not present

## 2018-10-23 DIAGNOSIS — D4989 Neoplasm of unspecified behavior of other specified sites: Secondary | ICD-10-CM | POA: Diagnosis not present

## 2018-10-23 LAB — CBC
HCT: 26.5 % — ABNORMAL LOW (ref 39.0–52.0)
Hemoglobin: 8.4 g/dL — ABNORMAL LOW (ref 13.0–17.0)
MCH: 29.5 pg (ref 26.0–34.0)
MCHC: 31.7 g/dL (ref 30.0–36.0)
MCV: 93 fL (ref 80.0–100.0)
Platelets: 184 10*3/uL (ref 150–400)
RBC: 2.85 MIL/uL — ABNORMAL LOW (ref 4.22–5.81)
RDW: 15.8 % — ABNORMAL HIGH (ref 11.5–15.5)
WBC: 18.9 10*3/uL — ABNORMAL HIGH (ref 4.0–10.5)
nRBC: 0.1 % (ref 0.0–0.2)

## 2018-10-23 LAB — DIFFERENTIAL
BASOS ABS: 0.1 10*3/uL (ref 0.0–0.1)
Basophils Relative: 0 %
EOS ABS: 0.1 10*3/uL (ref 0.0–0.5)
Eosinophils Relative: 1 %
Lymphocytes Relative: 9 %
Lymphs Abs: 1.6 10*3/uL (ref 0.7–4.0)
MONO ABS: 2.6 10*3/uL — AB (ref 0.1–1.0)
Monocytes Relative: 14 %
NEUTROS ABS: 12 10*3/uL — AB (ref 1.7–7.7)
NEUTROS PCT: 63 %

## 2018-10-23 LAB — BASIC METABOLIC PANEL WITH GFR
Anion gap: 8 (ref 5–15)
BUN: 13 mg/dL (ref 8–23)
CO2: 27 mmol/L (ref 22–32)
Calcium: 7.9 mg/dL — ABNORMAL LOW (ref 8.9–10.3)
Chloride: 100 mmol/L (ref 98–111)
Creatinine, Ser: 1.14 mg/dL (ref 0.61–1.24)
GFR calc Af Amer: >60 mL/min
GFR calc non Af Amer: >60 mL/min
Glucose, Bld: 140 mg/dL — ABNORMAL HIGH (ref 70–99)
Potassium: 3.3 mmol/L — ABNORMAL LOW (ref 3.5–5.1)
Sodium: 135 mmol/L (ref 135–145)

## 2018-10-23 LAB — GLUCOSE, CAPILLARY
GLUCOSE-CAPILLARY: 121 mg/dL — AB (ref 70–99)
GLUCOSE-CAPILLARY: 132 mg/dL — AB (ref 70–99)
GLUCOSE-CAPILLARY: 143 mg/dL — AB (ref 70–99)
Glucose-Capillary: 129 mg/dL — ABNORMAL HIGH (ref 70–99)

## 2018-10-23 LAB — VITAMIN B12: Vitamin B-12: 325 pg/mL (ref 180–914)

## 2018-10-23 MED ORDER — POTASSIUM CHLORIDE CRYS ER 20 MEQ PO TBCR
40.0000 meq | EXTENDED_RELEASE_TABLET | Freq: Once | ORAL | Status: AC
  Administered 2018-10-23: 40 meq via ORAL
  Filled 2018-10-23: qty 2

## 2018-10-23 MED ORDER — MIDAZOLAM HCL 2 MG/2ML IJ SOLN
INTRAMUSCULAR | Status: AC | PRN
  Administered 2018-10-23: 1 mg via INTRAVENOUS

## 2018-10-23 MED ORDER — MIDAZOLAM HCL 2 MG/2ML IJ SOLN
INTRAMUSCULAR | Status: AC
  Filled 2018-10-23: qty 4

## 2018-10-23 MED ORDER — FENTANYL CITRATE (PF) 100 MCG/2ML IJ SOLN
INTRAMUSCULAR | Status: AC | PRN
  Administered 2018-10-23: 25 ug via INTRAVENOUS

## 2018-10-23 MED ORDER — FENTANYL CITRATE (PF) 100 MCG/2ML IJ SOLN
INTRAMUSCULAR | Status: AC
  Filled 2018-10-23: qty 4

## 2018-10-23 MED ORDER — HEPARIN SOD (PORK) LOCK FLUSH 100 UNIT/ML IV SOLN
INTRAVENOUS | Status: AC
  Filled 2018-10-23: qty 5

## 2018-10-23 NOTE — Plan of Care (Signed)

## 2018-10-23 NOTE — Consult Note (Signed)
Reason for Consult: Altered mental status Referring Physician: Preddy, P  CC: Altered mental status  HPI: Richard Gordon is an 67 y.o. male significant history of diabetes mellitus, hypertension, hyperlipidemia, chronic back pain and left leg pain, CVA, chronic kidney disease stage III initially presented to the ED with generalized weakness, fever of unknown source, and sepsis on 10/07/2018.  Since then he has been seen multiple times in the ED with elevated white count, fever and elevated lactic acid levels. He was started on empiric antibiotics with IV Vanco, cefepime and Flagyl. Patient was discharged at his request but readmitted with complaints of continued back and leg pain, generalized weakness, weight loss and sweating.  Further work-up during the course of his admission revealed aortic valve endocarditis on TEE, blood culture has been negative so far, he did have CT of the maxilla that has shown multiple periapical abscesses,  CT abdomen and pelvis showed diffuse soft tissue stranding in the retroperitoneal space of the abdomen around the pancrease, adrenal glands and kidneys.  MRI of the thoracal lumbar spine showed diffusely abnormal bone marrow signal within the thoracal lumbar including sacral spine with heterogeneous enhancement concerning for infiltrative tumor without pathological fractures.  He has been evaluated by hematology oncology for abnormal bone marrow signal, anemia and mild leukocytosis, elevated alkaline phosphate, and abnormal soft tissue stranding in the retroperitoneum. He is  currently undergoing treatment for endocarditis per infectious disease.  During the course of his admission patient has developed altered mental status therefore neurology consulted for further evaluation.  Past Medical History:  Diagnosis Date  . Chronic kidney disease (CKD), stage III (moderate) ([name]) 11/03/2015  . Diabetes mellitus without complication ([name])   . Hyperlipidemia   . Hypertension   .  Stroke [facility])    per pt 2 CVA in 2000,and 1998 (  R ,then L side effected seperately per pt )    Past Surgical History:  Procedure Laterality Date  . CATARACT EXTRACTION    . TEE WITHOUT CARDIOVERSION N/A 10/18/2018   Procedure: TRANSESOPHAGEAL ECHOCARDIOGRAM (TEE);  Surgeon: Teodoro Spray, MD;  Location: [facility] ORS;  Service: Cardiovascular;  Laterality: N/A;  . TONSILLECTOMY      Family History  Problem Relation Age of Onset  . Hypertension Father   . Diabetes Father   . Brain cancer Mother        per pt he was 80 and thinks this was cancer   . Coronary artery disease Sister        scarlet fever as child affected heart per pt    Social History:  reports that he quit smoking about 19 years ago. His smoking use included cigarettes. He has a 12.50 pack-year smoking history. He has quit using smokeless tobacco.  His smokeless tobacco use included snuff. He reports that he does not drink alcohol or use drugs.  No Known Allergies  Medications:  I have reviewed the patient's current medications. Prior to Admission:  Medications Prior to Admission  Medication Sig Dispense Refill Last Dose  . aspirin EC 81 MG tablet Take 1 tablet (81 mg total) by mouth daily.   10/15/2018 at 0800  . blood glucose meter kit and supplies KIT Accu chek. Check twice daily. E11.42. 1 each 0 Taking  . cefdinir (OMNICEF) 300 MG capsule Take 1 capsule (300 mg total) by mouth 2 (two) times daily. 28 capsule 0 10/15/2018 at 2000  . diltiazem (CARDIZEM) 30 MG tablet Take 1 tablet (30 mg total) by mouth every  8 (eight) hours. 90 tablet 0 10/15/2018 at 2000  . empagliflozin (JARDIANCE) 10 MG TABS tablet Take 10 mg by mouth daily. 90 tablet 3 10/15/2018 at 0800  . feeding supplement, GLUCERNA SHAKE, (GLUCERNA SHAKE) LIQD Take 237 mLs by mouth 3 (three) times daily between meals. 90 Can 0   . glucose blood (ONETOUCH VERIO) test strip Check blood sugar twice daily Dx: E11.42 100 each 12 Taking  . hydrochlorothiazide  (HYDRODIURIL) 12.5 MG tablet TAKE 1 TABLET DAILY (Patient taking differently: Take 12.5 mg by mouth daily. ) 90 tablet 0 10/15/2018 at 0800  . Insulin Glargine (BASAGLAR KWIKPEN) 100 UNIT/ML SOPN Inject 0.42 mLs (42 Units total) into the skin at bedtime. 5 pen 3 10/15/2018 at 2000  . Insulin Pen Needle 31G X 5 MM MISC 1 each by Does not apply route daily. 100 each 3 Taking  . Lancets (ACCU-CHEK SOFT TOUCH) lancets Check blood sugar twice daily. DX: E11.42.  Quantity Sufficient 90 day supply 300 each 3 Taking  . lisinopril (PRINIVIL,ZESTRIL) 20 MG tablet TAKE 1 TABLET DAILY (Patient taking differently: Take 20 mg by mouth daily. ) 90 tablet 1 10/15/2018 at 0800  . metFORMIN (GLUCOPHAGE) 1000 MG tablet Take 1 tablet (1,000 mg total) by mouth 2 (two) times daily. 180 tablet 3 10/15/2018 at 1800  . simvastatin (ZOCOR) 5 MG tablet Take 1 tablet (5 mg total) by mouth daily at 6 PM. 90 tablet 3 10/15/2018 at 2000  . metroNIDAZOLE (FLAGYL) 500 MG tablet Take 1 tablet (500 mg total) by mouth 3 (three) times daily. 42 tablet 0    Scheduled: . aspirin EC  81 mg Oral Daily  . diltiazem  30 mg Oral Q8H  . docusate sodium  100 mg Oral BID  . enoxaparin (LOVENOX) injection  40 mg Subcutaneous Q24H  . feeding supplement (GLUCERNA SHAKE)  237 mL Oral TID BM  . insulin aspart  0-15 Units Subcutaneous TID WC  . insulin aspart  0-5 Units Subcutaneous QHS  . insulin glargine  20 Units Subcutaneous QHS  . lisinopril  20 mg Oral Daily  . metoprolol tartrate  25 mg Oral BID  . sodium chloride flush  3 mL Intravenous Q12H    ROS: History obtained from the patient   General ROS: Positive for - chills, fatigue, fever, night sweats, weight loss Psychological ROS: negative for - behavioral disorder, hallucinations, memory difficulties, mood swings or suicidal ideation Ophthalmic ROS: negative for - blurry vision, double vision, eye pain or loss of vision ENT ROS: negative for - epistaxis, nasal discharge, oral  lesions, sore throat, tinnitus or vertigo Allergy and Immunology ROS: negative for - hives or itchy/watery eyes Hematological and Lymphatic ROS: negative for - bleeding problems, bruising or swollen lymph nodes Endocrine ROS: negative for - galactorrhea, hair pattern changes, polydipsia/polyuria or temperature intolerance Respiratory ROS: negative for - cough, hemoptysis, shortness of breath or wheezing Cardiovascular ROS: negative for - chest pain, dyspnea on exertion, edema or irregular heartbeat Gastrointestinal ROS: negative for - abdominal pain, diarrhea, hematemesis, nausea/vomiting or stool incontinence Genito-Urinary ROS: negative for - dysuria, hematuria, incontinence or urinary frequency/urgency Musculoskeletal ROS: negative for - joint swelling or muscular weakness Neurological ROS: as noted in HPI Dermatological ROS: negative for rash and skin lesion changes  Physical Examination: Blood pressure 117/60, pulse 98, temperature 98.2 F (36.8 C), temperature source Oral, resp. rate (!) 23, height 5' 10" (1.778 m), weight 66.3 kg, SpO2 94 %.  HEENT-  Normocephalic, no lesions, without obvious abnormality.  pale external eye and conjunctiva.  Normal TM's bilaterally.  Normal auditory canals and external ears. Normal external nose, mucus membranes and septum.  Normal pharynx. Cardiovascular- S1, S2 normal, pulses palpable throughout   Lungs- chest clear, no wheezing, rales, normal symmetric air entry Abdomen- soft, non-tender; bowel sounds normal; no masses,  no organomegaly Extremities- no edema Lymph-no adenopathy palpable Musculoskeletal-no joint tenderness, deformity or swelling Skin-warm and dry, pale appearance no hyperpigmentation, vitiligo, or suspicious lesions  Neurological Exam   Mental Status: Alert, oriented x 3, knew the month and upcoming holiday. Reported it was 38.  Speech fluent without evidence of aphasia.  Able to follow 3 step commands without difficulty.  Attention span and concentration seemed appropriate  Cranial Nerves: II: Discs flat bilaterally; Visual fields grossly normal, pupils equal, round, reactive to light and accommodation III,IV, VI: ptosis not present, extra-ocular motions intact bilaterally V,VII: smile symmetric, facial light touch sensation intact VIII: hearing normal bilaterally IX,X: gag reflex present XI: bilateral shoulder shrug XII: midline tongue extension Motor: Right :  Upper extremity   5/5 Without pronator drift      Left: Upper extremity   5/5 without pronator drift Right:   Lower extremity   5/5                                          Left: Lower extremity   5/5 Tone and bulk:normal tone throughout; no atrophy noted Sensory: Pinprick and light touch intact bilaterally Deep Tendon Reflexes: 2+ and symmetric throughout Plantars: Right: Downgoing                            Left: downgoing Cerebellar: Finger-to-nose testing intact bilaterally. Heel to shin testing normal bilaterally Gait: not tested due to safety concerns  Data Reviewed  Laboratory Studies:   Basic Metabolic Panel: Recent Labs  Lab 10/17/18 0528 10/19/18 0529 10/20/18 0538 10/21/18 0459 10/23/18 0542  NA 136 136 136 137 135  K 3.3* 3.5 3.2* 3.7 3.3*  CL 103 100 101 101 100  CO2 _0 GLUCOSE 131* 113* 79 102* 140*  BUN _1 CREATININE 0.98 0.93 0.96 1.06 1.14  CALCIUM 7.6* 7.8* 8.0* 8.1* 7.9*  MG 1.7  --   --   --   --     Liver Function Tests: Recent Labs  Lab 10/16/18 1236 10/19/18 0529 10/20/18 0538  AST 40 15 16  ALT 39 15 14  ALKPHOS 237* 234* 216*  BILITOT 1.1 0.8 0.8  PROT 6.7 5.7* 5.7*  ALBUMIN 3.0* 2.4* 2.4*   Recent Labs  Lab 10/19/18 0529  LIPASE 26   Recent Labs  Lab 10/22/18 1724  AMMONIA 20    CBC: Recent Labs  Lab 10/16/18 1236  10/19/18 0529 10/20/18 0538 10/21/18 0459 10/22/18 0721 10/23/18 0542  WBC 31.1*   < > 14.3* 14.9* 18.6* 20.3* 18.9*  NEUTROABS 18.2*   --   --  10.1*  --  13.5* 12.0*  HGB 10.7*   < > 8.9* 8.9* 9.3* 8.7* 8.4*  HCT 34.1*   < > 28.1* 28.0* 29.7* 27.5* 26.5*  MCV 95.3   < > 95.3 93.0 94.9 93.2 93.0  PLT 259   < > 163 173 206 204 184   < > = values in this interval not displayed.  Cardiac Enzymes: Recent Labs  Lab 10/16/18 1236 10/17/18 0528  CKTOTAL 15* 20*  TROPONINI <0.03  --     BNP: Invalid input(s): POCBNP  CBG: Recent Labs  Lab 10/22/18 0744 10/22/18 1116 10/22/18 1604 10/22/18 2049 10/23/18 0825  GLUCAP 151* 173* 167* 165* 121*    Microbiology: Results for orders placed or performed during the hospital encounter of 10/16/18  Culture, blood (routine x 2)     Status: None   Collection Time: 10/16/18  3:23 PM  Result Value Ref Range Status   Specimen Description BLOOD RIGHT ANTECUBITAL  Final   Special Requests   Final    BOTTLES DRAWN AEROBIC AND ANAEROBIC Blood Culture adequate volume   Culture   Final    NO GROWTH 5 DAYS Performed at [facility], [address], [place], [place] [address]    Report Status 10/21/2018 FINAL  Final  Culture, blood (routine x 2)     Status: None   Collection Time: 10/16/18  3:24 PM  Result Value Ref Range Status   Specimen Description BLOOD BLOOD LEFT HAND  Final   Special Requests   Final    BOTTLES DRAWN AEROBIC AND ANAEROBIC Blood Culture results may not be optimal due to an excessive volume of blood received in culture bottles   Culture   Final    NO GROWTH 5 DAYS Performed at [facility], [address] [place], [place] [address]    Report Status 10/21/2018 FINAL  Final    Coagulation Studies: No results for input(s): LABPROT, INR in the last 72 hours.  Urinalysis:  Recent Labs  Lab 10/16/18 1523  COLORURINE YELLOW*  LABSPEC 1.020  PHURINE 6.0  GLUCOSEU >=500*  HGBUR NEGATIVE  BILIRUBINUR NEGATIVE  KETONESUR 20*  PROTEINUR 100*  NITRITE NEGATIVE  LEUKOCYTESUR TRACE*    Lipid Panel:     Component Value  Date/Time   CHOL 105 02/26/2018 1021   CHOL 109 02/05/2016 0903   TRIG 255.0 (H) 02/26/2018 1021   HDL 23.70 (L) 02/26/2018 1021   HDL 15 (L) 02/05/2016 0903   CHOLHDL 4 02/26/2018 1021   VLDL 51.0 (H) 02/26/2018 1021   LDLCALC 40 08/15/2016 1342   LDLCALC 17 02/05/2016 0903    HgbA1C:  Lab Results  Component Value Date   HGBA1C 6.3 (H) 10/16/2018    Urine Drug Screen:  No results found for: LABOPIA, COCAINSCRNUR, LABBENZ, AMPHETMU, THCU, LABBARB  Alcohol Level: No results for input(s): ETH in the last 168 hours.  Other results: EKG: normal EKG, normal sinus rhythm, unchanged from previous tracings.  Imaging: Ct Head Wo Contrast  Result Date: 10/22/2018 CLINICAL DATA:  67 year old male with altered level of consciousness. Subsequent encounter. EXAM: CT HEAD WITHOUT CONTRAST TECHNIQUE: Contiguous axial images were obtained from the base of the skull through the vertex without intravenous contrast. COMPARISON:  10/19/2018 MR. 10/11/2018 and 06/21/2011 CT. FINDINGS: Brain: No intracranial hemorrhage or CT evidence of large acute infarct. Remote left parietal lobe infarct. Marked cerebellar and brainstem atrophy (may represent result of neurodegenerative disorder). Moderate supratentorial atrophy. 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 No intracranial mass lesion noted on this unenhanced exam. Vascular: Vascular calcifications Skull: No acute abnormality Sinuses/Orbits: No acute orbital abnormality. Mild mucosal thickening sphenoid sinuses. Other: Visualized mastoid air cells and middle ear cavities are clear. IMPRESSION: 1. No intracranial hemorrhage or CT evidence of large acute infarct. 2. Remote left parietal lobe infarct. 3. Marked  cerebellar and brainstem atrophy (may represent result of neurodegenerative disorder). Moderate supratentorial atrophy. 4. 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 5. Mild mucosal thickening sphenoid sinuses. Electronically Signed   By: Genia Del M.D.   On: 10/22/2018 17:45   Ct Bone Marrow Biopsy & Aspiration  Result Date: 10/23/2018 INDICATION: Diffuse infiltrative marrow process. Please perform CT-guided bone marrow biopsy for tissue diagnostic purposes. EXAM: CT-GUIDED BONE MARROW BIOPSY AND ASPIRATION MEDICATIONS: None ANESTHESIA/SEDATION: Fentanyl 25 mcg IV; Versed 1 mg IV Sedation Time: 11 Minutes; The patient was continuously monitored during the procedure by the interventional radiology nurse under my direct supervision. COMPLICATIONS: None immediate. PROCEDURE: Informed consent was obtained from the patient following an explanation of the procedure, risks, benefits and alternatives. The patient understands, agrees and consents for the procedure. All questions were addressed. A time out was performed prior to the initiation of the procedure. The patient was positioned prone and non-contrast localization CT was performed of the pelvis to demonstrate the iliac marrow spaces. The operative site was prepped and draped in the usual sterile fashion. Under sterile conditions and local anesthesia, a 22 gauge spinal needle was utilized for procedural planning. Next, an 11 gauge coaxial bone biopsy needle was advanced into the right iliac marrow space. Note, the right marrow space was selected for but biopsy given its more pronounced infiltrative appearance. Needle position was confirmed with CT imaging. Initially, bone marrow aspiration was attempted however no marrow was able to be aspirated. Next, a bone marrow biopsy was obtained with the 11 gauge outer bone marrow device. The 11 gauge coaxial bone biopsy needle was re-advanced into a slightly different location within the left iliac marrow space, appropriate position was again confirmed however again, no marrow was able to be aspirated. An additional bone  marrow biopsy was obtained. Samples were prepared with the cytotechnologist and deemed adequate. The needle was removed intact. Hemostasis was obtained with compression and a dressing was placed. The patient tolerated the procedure well without immediate post procedural complication. IMPRESSION: Successful CT guided left iliac bone marrow core biopsy. No marrow was able to be aspirated from either bone marrow biopsy sites. Electronically Signed   By: Sandi Mariscal M.D.   On: 10/23/2018 10:11   Patient seen and examined.  Clinical course and management discussed.  Necessary edits performed.  I agree with the above.  Assessment and plan of care developed and discussed below.    Assessments: 67 y.o male with significant history of diabetes mellitus, hypertension, hyperlipidemia, CVA, chronic kidney disease stage III initially presenting with multiple medical issues including generalized weakness, chronic back and leg pain, fever of unknown source, leukocytosis, weight loss and fatigue for the past few weeks. Over the course of admission was noted to be altered. Etiology likely multifactorial in the setting of multiple medical issues including possible malignancy.  Work up thus far noted to have aortic valve endocarditis on TEE,  Diffuse soft tissue stranding in the retroperitoneal space of the abdomen around the pancrease, adrenal glands and kdneys on CT abdomen and pelvis, MRI of the thoraco/lumar spine also showed diffuse abnormal bone marrow signal with heterogenous enhancement highly suspicious for infiltrative process. MRI brain show no acute intracranial process. Pontocerebellar atrophy and old strokes and left inferior frontal lobe encephalomalacia noted.    Recommendations: 1. Check labs: B12, B1, Thyroid panel, 2. EEG 3. Agree with current medical management.  This patient was staffed with Dr. Magda Paganini,  Jaylanni Eltringham who personally evaluated patient, reviewed documentation and agreed with assessment and plan  of care as above.  Rufina Falco, DNP, FNP-BC Board certified Nurse Practitioner Neurology Department  10/23/2018, 10:41 AM   Alexis Goodell, MD Neurology [phone]  10/23/2018  3:32 PM

## 2018-10-23 NOTE — Procedures (Signed)
ELECTROENCEPHALOGRAM REPORT   Patient: Richard Gordon       Room #: 259A-AA EEG No. ID: [id number] Age: 67 y.o.        Sex: male Referring Physician: Pyreddy Report Date:  10/23/2018        Interpreting Physician: Alexis Goodell  History: Richard Gordon is an 67 y.o. male with altered mental status  Medications:  ASA, Rocephin, Cardizem, Colace, Insulin, Prinivil, Lopressor, Vancomycin  Conditions of Recording:  This is a 21 channel routine scalp EEG performed with bipolar and monopolar montages arranged in accordance to the international 10/20 system of electrode placement. One channel was dedicated to EKG recording.  The patient is in the awake, drowsy and asleep states.  Description:  The waking background activity consists of a low voltage, symmetrical, fairly well organized, 8-9 Hz alpha activity, seen from the parieto-occipital and posterior temporal regions.  Low voltage fast activity, poorly organized, is seen anteriorly and is at times superimposed on more posterior regions.  A mixture of theta and alpha rhythms are seen from the central and temporal regions. The patient drowses with slowing to irregular, low voltage theta and beta activity.   The patient goes in to a light sleep with symmetrical sleep spindles, vertex central sharp transients and irregular slow activity.  No epileptiform activity is noted.  Hyperventilation was not performed. Intermittent photic stimulation was performed but failed to illicit any change in the tracing.    IMPRESSION: Normal electroencephalogram, awake, asleep and with activation procedures. There are no focal lateralizing or epileptiform features.   Alexis Goodell, MD Neurology [phone] 10/23/2018, 6:59 PM

## 2018-10-23 NOTE — Procedures (Signed)
Pre-procedure Diagnosis: Infiltrative Marrow process Post-procedure Diagnosis: Same  Technically successful CT guided bone marrow biopsy of the right iliac crest.   Complications: None Immediate  EBL: None  SignedSandi Mariscal Pager: [phone] 10/23/2018, 9:51 AM

## 2018-10-23 NOTE — Consult Note (Signed)
Chief Complaint: Infiltrative marrow process of uncertain  Referring Physician(s): Corcoran  Patient Status: [facility] - In-pt  History of Present Illness: Richard Gordon is a 67 y.o. male with past medical history significant for diabetes, stage III chronic kidney disease, hypertension, hyperlipidemia and stroke who was admitted with suspected bacterial endocarditis and sepsis who now presents now for CT-guided bone marrow biopsy and aspiration for evaluation of diffuse infiltrative marrow process seen on preceding thoracic and lumbar spine MRI obtained for the work-up of potential discitis osteomyelitis.  Patient continues to complain of left lower extremity pain and numbness.  He is otherwise without complaint.  Specifically, no fever or chills.  No chest pain or shortness of breath.  Past Medical History:  Diagnosis Date  . Chronic kidney disease (CKD), stage III (moderate) ([place]) 11/03/2015  . Diabetes mellitus without complication ([place])   . Hyperlipidemia   . Hypertension   . Stroke [facility])    per pt 2 CVA in 2000,and 1998 (  R ,then L side effected seperately per pt )    Past Surgical History:  Procedure Laterality Date  . CATARACT EXTRACTION    . TEE WITHOUT CARDIOVERSION N/A 10/18/2018   Procedure: TRANSESOPHAGEAL ECHOCARDIOGRAM (TEE);  Surgeon: Teodoro Spray, MD;  Location: [facility] ORS;  Service: Cardiovascular;  Laterality: N/A;  . TONSILLECTOMY      Allergies: Patient has no known allergies.  Medications: Prior to Admission medications   Medication Sig Start Date End Date Taking? Authorizing Provider  aspirin EC 81 MG tablet Take 1 tablet (81 mg total) by mouth daily. 07/04/17  Yes Cook, Jayce G, DO  blood glucose meter kit and supplies KIT Accu chek. Check twice daily. E11.42. 09/13/17  Yes Leone Haven, MD  cefdinir (OMNICEF) 300 MG capsule Take 1 capsule (300 mg total) by mouth 2 (two) times daily. 10/14/18  Yes Salary, Avel Peace, MD  diltiazem (CARDIZEM) 30  MG tablet Take 1 tablet (30 mg total) by mouth every 8 (eight) hours. 10/14/18  Yes Salary, Montell D, MD  empagliflozin (JARDIANCE) 10 MG TABS tablet Take 10 mg by mouth daily. 07/30/18  Yes Leone Haven, MD  feeding supplement, GLUCERNA SHAKE, (GLUCERNA SHAKE) LIQD Take 237 mLs by mouth 3 (three) times daily between meals. 10/14/18  Yes Salary, Holly Bodily D, MD  glucose blood (ONETOUCH VERIO) test strip Check blood sugar twice daily Dx: E11.42 07/20/18  Yes Leone Haven, MD  hydrochlorothiazide (HYDRODIURIL) 12.5 MG tablet TAKE 1 TABLET DAILY Patient taking differently: Take 12.5 mg by mouth daily.  07/20/18  Yes Leone Haven, MD  Insulin Glargine (BASAGLAR KWIKPEN) 100 UNIT/ML SOPN Inject 0.42 mLs (42 Units total) into the skin at bedtime. 12/21/17  Yes Leone Haven, MD  Insulin Pen Needle 31G X 5 MM MISC 1 each by Does not apply route daily. 07/20/18  Yes Leone Haven, MD  Lancets (ACCU-CHEK SOFT TOUCH) lancets Check blood sugar twice daily. DX: E11.42.  Quantity Sufficient 90 day supply 03/27/17  Yes Cook, Jayce G, DO  lisinopril (PRINIVIL,ZESTRIL) 20 MG tablet TAKE 1 TABLET DAILY Patient taking differently: Take 20 mg by mouth daily.  10/08/18  Yes Leone Haven, MD  metFORMIN (GLUCOPHAGE) 1000 MG tablet Take 1 tablet (1,000 mg total) by mouth 2 (two) times daily. 02/27/18  Yes Leone Haven, MD  simvastatin (ZOCOR) 5 MG tablet Take 1 tablet (5 mg total) by mouth daily at 6 PM. 12/19/17  Yes Caryl Bis Angela Adam, MD  metroNIDAZOLE (  FLAGYL) 500 MG tablet Take 1 tablet (500 mg total) by mouth 3 (three) times daily. 10/14/18   Salary, Avel Peace, MD     Family History  Problem Relation Age of Onset  . Hypertension Father   . Diabetes Father   . Brain cancer Mother        per pt he was 40 and thinks this was cancer   . Coronary artery disease Sister        scarlet fever as child affected heart per pt    Social History   Socioeconomic History  . Marital status:  Single    Spouse name: Not on file  . Number of children: Not on file  . Years of education: Not on file  . Highest education level: Not on file  Occupational History  . Not on file  Social Needs  . Financial resource strain: Not on file  . Food insecurity:    Worry: Not on file    Inability: Not on file  . Transportation needs:    Medical: Not on file    Non-medical: Not on file  Tobacco Use  . Smoking status: Former Smoker    Packs/day: 0.50    Years: 25.00    Pack years: 12.50    Types: Cigarettes    Last attempt to quit: 04/04/1999    Years since quitting: 19.5  . Smokeless tobacco: Former Systems developer    Types: Snuff  Substance and Sexual Activity  . Alcohol use: No    Alcohol/week: 0.0 standard drinks  . Drug use: No  . Sexual activity: Not Currently    Partners: Female  Lifestyle  . Physical activity:    Days per week: Not on file    Minutes per session: Not on file  . Stress: Not on file  Relationships  . Social connections:    Talks on phone: Not on file    Gets together: Not on file    Attends religious service: Not on file    Active member of club or organization: Not on file    Attends meetings of clubs or organizations: Not on file    Relationship status: Not on file  Other Topics Concern  . Not on file  Social History Narrative   ** Merged History Encounter **        ECOG Status: 2 - Symptomatic, <50% confined to bed  Review of Systems: A 12 point ROS discussed and pertinent positives are indicated in the HPI above.  All other systems are negative.  Review of Systems  Constitutional: Negative for fever and unexpected weight change.  Respiratory: Negative.   Cardiovascular: Negative.     Vital Signs: BP 138/61 (BP Location: Left Arm)   Pulse 93   Temp 98.2 F (36.8 C) (Oral)   Resp 18   Ht 5' 10" (1.778 m)   Wt 66.3 kg   SpO2 93%   BMI 20.98 kg/m   Physical Exam  Constitutional: He appears well-developed and well-nourished.  HENT:    Head: Normocephalic and atraumatic.  Cardiovascular:  Distant heart sounds.  Pulmonary/Chest: Effort normal and breath sounds normal.  Skin: Skin is warm and dry.  Psychiatric: He has a normal mood and affect. His behavior is normal.  Unable to provide significant details regarding his left lower extremity complaints.  Nursing note reviewed.   Imaging: Dg Chest 2 View  Result Date: 10/10/2018 CLINICAL DATA:  Fever. EXAM: CHEST - 2 VIEW COMPARISON:  10/09/2018. FINDINGS: Mediastinum and hilar  structures normal. Heart size stable. Mild bibasilar atelectasis. Mild left base infiltrate. No pleural effusion or pneumothorax. IMPRESSION: Mild bibasilar atelectasis.  Mild left base infiltrate. Electronically Signed   By: [name]  Register   On: 10/10/2018 12:54   Dg Chest 2 View  Result Date: 10/09/2018 CLINICAL DATA:  Generalized body aches. EXAM: CHEST - 2 VIEW COMPARISON:  Radiographs of March 14, 2011. FINDINGS: The heart size and mediastinal contours are within normal limits. Both lungs are clear. No pneumothorax or pleural effusion is noted. The visualized skeletal structures are unremarkable. IMPRESSION: No active cardiopulmonary disease. Electronically Signed   By: Marijo Conception, M.D.   On: 10/09/2018 10:37   Ct Head Wo Contrast  Result Date: 10/22/2018 CLINICAL DATA:  67 year old male with altered level of consciousness. Subsequent encounter. EXAM: CT HEAD WITHOUT CONTRAST TECHNIQUE: Contiguous axial images were obtained from the base of the skull through the vertex without intravenous contrast. COMPARISON:  10/19/2018 MR. 10/11/2018 and 06/21/2011 CT. FINDINGS: Brain: No intracranial hemorrhage or CT evidence of large acute infarct. Remote left parietal lobe infarct. Marked cerebellar and brainstem atrophy (may represent result of neurodegenerative disorder). Moderate supratentorial atrophy. 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 No intracranial mass lesion noted on this unenhanced exam. Vascular: Vascular calcifications Skull: No acute abnormality Sinuses/Orbits: No acute orbital abnormality. Mild mucosal thickening sphenoid sinuses. Other: Visualized mastoid air cells and middle ear cavities are clear. IMPRESSION: 1. No intracranial hemorrhage or CT evidence of large acute infarct. 2. Remote left parietal lobe infarct. 3. Marked cerebellar and brainstem atrophy (may represent result of neurodegenerative disorder). Moderate supratentorial atrophy. 4. 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 5. Mild mucosal thickening sphenoid sinuses. Electronically Signed   By: Genia Del M.D.   On: 10/22/2018 17:45   Ct Chest Wo Contrast  Result Date: 10/20/2018 CLINICAL DATA:  Cough, fever. Possible endocarditis on TEE. Abnormal marrow signal on thoracolumbar spine MRI raises concern for malignancy. EXAM: CT CHEST WITHOUT CONTRAST TECHNIQUE: Multidetector CT imaging of the chest was performed following the standard protocol without IV contrast. COMPARISON:  Correlation with thoracolumbar spine MRI dated 10/19/2018 and recent CT abdomen/pelvis dated 10/10/2018. FINDINGS: Cardiovascular: The heart is normal in size. No pericardial effusion. No evidence of thoracic aortic aneurysm. Atherosclerotic calcifications of the aortic arch. Three vessel coronary atherosclerosis. Mediastinum/Nodes: No suspicious mediastinal or axillary lymphadenopathy. 7 mm short axis azygoesophageal recess node (series 2/image 80), within normal limits, previously 9 mm. Visualized thyroid is unremarkable. Lungs/Pleura: Lungs are essentially clear. Mild dependent atelectasis in the bilateral lower lobes. No focal consolidation. No pleural effusion or pneumothorax. Upper Abdomen: Visualized upper abdomen is notable for mild stranding along the bilateral adrenal glands and kidneys,  possibly improved from recent CT. Musculoskeletal: Diffuse/heterogeneous osseous sclerosis involving the visualized axial and appendicular skeleton including the thoracolumbar spine and sternum. IMPRESSION: Diffuse/heterogeneous osseous sclerosis involving the visualized axial and appendicular skeleton. Differential considerations include renal osteodystrophy, diffuse osseous metastases secondary to malignancy (most commonly prostate cancer), or infiltrative disorders including lymphoma. Otherwise, no findings suspicious for malignancy in the chest. Aortic Atherosclerosis (ICD10-I70.0). Electronically Signed   By: Julian Hy M.D.   On: 10/20/2018 10:15   Mr Brain Wo Contrast  Result Date: 10/19/2018 CLINICAL DATA:  Progressive leg weakness, altered mental status. Sepsis. History of stroke, hypertension, hyperlipidemia, diabetes. EXAM: MRI HEAD WITHOUT CONTRAST TECHNIQUE: Multiplanar, multiecho pulse sequences of the brain  and surrounding structures were obtained without intravenous contrast. COMPARISON:  CT face October 11, 2018, MRI head March 14, 2011 FINDINGS: INTRACRANIAL CONTENTS: No reduced diffusion to suggest acute ischemia. No susceptibility artifact to suggest hemorrhage. Disproportionate severely atrophic cerebellum similar to progressed. Moderately atrophic pons. No hydrocephalus. LEFT inferior frontal lobe encephalomalacia, given enlargement of LEFT frontal sinuses most compatible with neonatal insult. Old RIGHT thalamus infarct. Old LEFT parietal infarct with mild ex vacuo dilatation subjacent ventricle. No hydrocephalus. No midline shift, mass effect or masses. No abnormal extra-axial fluid collections. VASCULAR: Normal major intracranial vascular flow voids present at skull base. SKULL AND UPPER CERVICAL SPINE: No abnormal sellar expansion. No suspicious calvarial bone marrow signal. Craniocervical junction maintained. SINUSES/ORBITS: Mild paranasal sinus mucosal thickening without  air-fluid levels. Mastoid air cells are well aerated.The included ocular globes and orbital contents are non-suspicious. Status post RIGHT ocular lens implant. OTHER: None. IMPRESSION: 1. No acute intracranial process. 2. 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 3. Old small LEFT parietal/MCA territory infarct. Old RIGHT thalamus infarct. 4. 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 Electronically Signed   By: Elon Alas M.D.   On: 10/19/2018 05:13   Mr Thoracic Spine W Wo Contrast  Result Date: 10/19/2018 CLINICAL DATA:  Mid and lower back pain, leg weakness. Sepsis. Evaluate for spinal infection. History of endocarditis, diabetes. EXAM: MRI THORACIC AND LUMBAR SPINE WITHOUT AND WITH CONTRAST TECHNIQUE: Multiplanar and multiecho pulse sequences of the thoracic and lumbar spine were obtained without and with intravenous contrast. CONTRAST:  7 cc Gadavist COMPARISON:  CT abdomen and pelvis October 10, 2018 FINDINGS: MRI THORACIC SPINE FINDINGS ALIGNMENT: Maintenance of the thoracic kyphosis. No malalignment. VERTEBRAE/DISCS: Vertebral bodies are intact. Diffusely heterogeneous bone marrow signal with patchy bright STIR signal in superimposed irregular enhancement of nearly all thoracic vertebral bodies. Preservation of the cortex. Intervertebral disc morphologies and signal maintained. No abnormal disc enhancement. CORD: Thoracic spinal cord is normal morphology and signal characteristics. No abnormal cord or leptomeningeal enhancement. Epidural enhancement within the included cervical spine. Minimal ventral epidural enhancement versus failure of fat saturation. PREVERTEBRAL AND PARASPINAL SOFT TISSUES:  Nonacute. DISC LEVELS: No disc bulge, canal stenosis nor neural foraminal narrowing. MRI LUMBAR SPINE FINDINGS SEGMENTATION: For the purposes of this report, the last well-formed intervertebral disc is reported as L5-S1.  ALIGNMENT: Maintained lumbar lordosis. No malalignment. VERTEBRAE: Vertebral bodies are intact. Diffusely heterogeneous bone marrow signal with patchy bright STIR signal in superimposed irregular enhancement of all lumbar vertebral bodies and included sacrum. Preservation of the cortex. Intervertebral disc morphologies and signal maintained. No abnormal disc enhancement. Congenital canal narrowing on the basis of foreshortened pedicles. CONUS MEDULLARIS AND CAUDA EQUINA: Conus medullaris terminates at L1-2 and demonstrates normal morphology and signal characteristics. Cauda equina is normal. No abnormal cord, leptomeningeal or epidural enhancement. PARASPINAL AND OTHER SOFT TISSUES: Mild prevertebral fat stranding without focal fluid collection or mass. Low-grade paraspinal muscle strain DISC LEVELS: L1-2: Annular bulging enhancing annular fissure. No canal stenosis or neural foraminal narrowing. L2-3: No disc bulge, canal stenosis nor neural foraminal narrowing. L3-4: Annular bulging, small LEFT subarticular and RIGHT extraforaminal disc protrusions. No canal stenosis. Moderate RIGHT and mild LEFT neural foraminal narrowing. L4-5: Moderate broad-based LEFT subarticular disc protrusion. Small broad-based disc bulge, enhancing annular fissures. Mild canal stenosis, narrowed LEFT lateral recess which may affect the traversing LEFT L5 nerve. Mild LEFT neural foraminal narrowing. L5-S1: Small central disc protrusion without canal stenosis. Mild neural foraminal narrowing. IMPRESSION: 1.  Diffusely abnormal bone marrow signal within the thoracolumbar and included sacral spine with heterogeneous enhancement highly concerning for infiltrative tumor (infiltrative metastatic disease versus myeloproliferative disorder or leukemia/lymphoma). No pathologic fracture. Atypical appearance for infection. 2. Faint epidural enhancement, finding seen with venous congestion, less likely infection. 3. No thoracolumbar canal stenosis.  Neural foraminal narrowing L3-4 through L5-S1: Moderate on the RIGHT at L3-4. Electronically Signed   By: Elon Alas M.D.   On: 10/19/2018 05:40   Mr Lumbar Spine W Wo Contrast  Result Date: 10/19/2018 CLINICAL DATA:  Mid and lower back pain, leg weakness. Sepsis. Evaluate for spinal infection. History of endocarditis, diabetes. EXAM: MRI THORACIC AND LUMBAR SPINE WITHOUT AND WITH CONTRAST TECHNIQUE: Multiplanar and multiecho pulse sequences of the thoracic and lumbar spine were obtained without and with intravenous contrast. CONTRAST:  7 cc Gadavist COMPARISON:  CT abdomen and pelvis October 10, 2018 FINDINGS: MRI THORACIC SPINE FINDINGS ALIGNMENT: Maintenance of the thoracic kyphosis. No malalignment. VERTEBRAE/DISCS: Vertebral bodies are intact. Diffusely heterogeneous bone marrow signal with patchy bright STIR signal in superimposed irregular enhancement of nearly all thoracic vertebral bodies. Preservation of the cortex. Intervertebral disc morphologies and signal maintained. No abnormal disc enhancement. CORD: Thoracic spinal cord is normal morphology and signal characteristics. No abnormal cord or leptomeningeal enhancement. Epidural enhancement within the included cervical spine. Minimal ventral epidural enhancement versus failure of fat saturation. PREVERTEBRAL AND PARASPINAL SOFT TISSUES:  Nonacute. DISC LEVELS: No disc bulge, canal stenosis nor neural foraminal narrowing. MRI LUMBAR SPINE FINDINGS SEGMENTATION: For the purposes of this report, the last well-formed intervertebral disc is reported as L5-S1. ALIGNMENT: Maintained lumbar lordosis. No malalignment. VERTEBRAE: Vertebral bodies are intact. Diffusely heterogeneous bone marrow signal with patchy bright STIR signal in superimposed irregular enhancement of all lumbar vertebral bodies and included sacrum. Preservation of the cortex. Intervertebral disc morphologies and signal maintained. No abnormal disc enhancement. Congenital canal  narrowing on the basis of foreshortened pedicles. CONUS MEDULLARIS AND CAUDA EQUINA: Conus medullaris terminates at L1-2 and demonstrates normal morphology and signal characteristics. Cauda equina is normal. No abnormal cord, leptomeningeal or epidural enhancement. PARASPINAL AND OTHER SOFT TISSUES: Mild prevertebral fat stranding without focal fluid collection or mass. Low-grade paraspinal muscle strain DISC LEVELS: L1-2: Annular bulging enhancing annular fissure. No canal stenosis or neural foraminal narrowing. L2-3: No disc bulge, canal stenosis nor neural foraminal narrowing. L3-4: Annular bulging, small LEFT subarticular and RIGHT extraforaminal disc protrusions. No canal stenosis. Moderate RIGHT and mild LEFT neural foraminal narrowing. L4-5: Moderate broad-based LEFT subarticular disc protrusion. Small broad-based disc bulge, enhancing annular fissures. Mild canal stenosis, narrowed LEFT lateral recess which may affect the traversing LEFT L5 nerve. Mild LEFT neural foraminal narrowing. L5-S1: Small central disc protrusion without canal stenosis. Mild neural foraminal narrowing. IMPRESSION: 1. Diffusely abnormal bone marrow signal within the thoracolumbar and included sacral spine with heterogeneous enhancement highly concerning for infiltrative tumor (infiltrative metastatic disease versus myeloproliferative disorder or leukemia/lymphoma). No pathologic fracture. Atypical appearance for infection. 2. Faint epidural enhancement, finding seen with venous congestion, less likely infection. 3. No thoracolumbar canal stenosis. Neural foraminal narrowing L3-4 through L5-S1: Moderate on the RIGHT at L3-4. Electronically Signed   By: Elon Alas M.D.   On: 10/19/2018 05:40   Ct Abdomen Pelvis W Contrast  Result Date: 10/10/2018 CLINICAL DATA:  Sepsis.  Fever of unknown origin. EXAM: CT ABDOMEN AND PELVIS WITH CONTRAST TECHNIQUE: Multidetector CT imaging of the abdomen and pelvis was performed using the  standard protocol following bolus administration of intravenous  contrast. CONTRAST:  117m ISOVUE-300 IOPAMIDOL (ISOVUE-300) INJECTION 61% COMPARISON:  None. FINDINGS: Lower chest: Coronary artery calcification is evident. 9 mm short axis paraesophageal lymph node identified image 3/series 2. 4 mm right lower lobe pulmonary nodule identified (10/4). Hepatobiliary: No focal abnormality within the liver parenchyma. There is no evidence for gallstones, gallbladder wall thickening, or pericholecystic fluid. No intrahepatic or extrahepatic biliary dilation. Pancreas: Subtle peripancreatic edema evident with a suggestion of edema within the head of pancreas. No dilatation of the main pancreatic duct. Spleen: No splenomegaly. No focal mass lesion. Adrenals/Urinary Tract: No adrenal nodule or mass although there is surrounding stranding of both adrenal glands. Perinephric edema noted bilaterally without hydronephrosis or focal renal mass lesion. No evidence for hydroureter. The urinary bladder appears normal for the degree of distention. Stomach/Bowel: Stomach is nondistended. No gastric wall thickening. No evidence of outlet obstruction. Duodenum is normally positioned as is the ligament of Treitz. No small bowel wall thickening. No small bowel dilatation. The terminal ileum is normal. The appendix is normal. No gross colonic mass. No colonic wall thickening. No substantial diverticular change. Vascular/Lymphatic: There is abdominal aortic atherosclerosis without aneurysm. Portal vein and superior mesenteric vein are patent. Splenic vein is patent. There is no gastrohepatic or hepatoduodenal ligament lymphadenopathy. No intraperitoneal or retroperitoneal lymphadenopathy. Upper normal portal caval lymph node evident. Small para-aortic lymph nodes are associated. No pelvic sidewall lymphadenopathy. Reproductive: The prostate gland and seminal vesicles have normal imaging features. Other: No intraperitoneal free fluid.  Musculoskeletal: Small bilateral groin hernias contain only fat. Tiny gas bubbles in the subcutaneous fat of the right paramidline anterior abdominal wall likely related to an injection site. No worrisome lytic or sclerotic osseous abnormality. Heterogeneous bony mineralization likely related to the patient's known chronic kidney disease. IMPRESSION: 1. Soft tissue stranding is identified in the retroperitoneal space of the abdomen, diffusely around the pancreas, adrenal glands, and kidneys. These changes are fairly evenly distributed without a definite epicenter making the appearance indeterminate. Changes around the pancreas could be related to pancreatitis and the perirenal edema could reflect pyelonephritis. Bilateral symmetric periadrenal edema/stranding is of uncertain significance. 2. No evidence for discrete, focal abscess in the abdomen/pelvis. 3. 4 mm right lower lobe pulmonary nodule. No follow-up needed if patient is low-risk. Non-contrast chest CT can be considered in 12 months if patient is high-risk. This recommendation follows the consensus statement: Guidelines for Management of Incidental Pulmonary Nodules Detected on CT Images: From the Fleischner Society 2017; Radiology 2017; 284:228-243. 4.  Aortic Atherosclerois (ICD10-170.0) Electronically Signed   By: EMisty StanleyM.D.   On: 10/10/2018 17:00   Ct Maxillofacial W Contrast  Result Date: 10/11/2018 CLINICAL DATA:  Tooth extraction 1 week ago. Septic. Assess for source. EXAM: CT MAXILLOFACIAL WITH CONTRAST TECHNIQUE: Multidetector CT imaging of the maxillofacial structures was performed with intravenous contrast. Multiplanar CT image reconstructions were also generated. CONTRAST:  722mOMNIPAQUE IOHEXOL 300 MG/ML  SOLN COMPARISON:  CT HEAD June 21, 2011 FINDINGS: OSSEOUS: No acute facial fracture. The mandible is intact, the condyles are located. No destructive bony lesions. Multiple mandible periapical abscess without soft tissue component.  Moderate RIGHT C3-4 facet arthropathy. ORBITS: Ocular globes and orbital contents are non suspicious. Status post RIGHT ocular lens implant. SINUSES: Paranasal sinuses are well aerated. Intact nasal septum is midline. Mastoid aircells are well aerated. SOFT TISSUES: No significant soft tissue swelling. No subcutaneous gas or radiopaque foreign bodies. Moderate calcific atherosclerosis carotid bifurcations. LIMITED INTRACRANIAL: Nonacute. Severe calcific atherosclerosis carotid siphons. IMPRESSION: 1.  No acute process in the face. 2. Multiple periapical abscess without fluid collection. Electronically Signed   By: Elon Alas M.D.   On: 10/11/2018 17:23   Dg Chest Portable 1 View  Result Date: 10/16/2018 CLINICAL DATA:  Generalized body aches and weakness, possible sepsis EXAM: PORTABLE CHEST 1 VIEW COMPARISON:  10/10/2018 FINDINGS: Cardiac shadow is within normal limits. The lungs are well aerated bilaterally. No focal infiltrate or sizable effusion is seen. No acute bony abnormality is noted. IMPRESSION: No acute abnormality seen. Electronically Signed   By: Inez Catalina M.D.   On: 10/16/2018 16:00   [place] Ekg Site Rite  Result Date: 10/20/2018 If Site Rite image not attached, placement could not be confirmed due to current cardiac rhythm.   Labs:  CBC: Recent Labs    10/20/18 0538 10/21/18 0459 10/22/18 0721 10/23/18 0542  WBC 14.9* 18.6* 20.3* 18.9*  HGB 8.9* 9.3* 8.7* 8.4*  HCT 28.0* 29.7* 27.5* 26.5*  PLT 173 206 204 184    COAGS: Recent Labs    10/09/18 2308 10/17/18 0528  INR 1.26 1.32  APTT 38*  --     BMP: Recent Labs    10/19/18 0529 10/20/18 0538 10/21/18 0459 10/23/18 0542  NA 136 136 137 135  K 3.5 3.2* 3.7 3.3*  CL 100 101 101 100  CO2 _0 GLUCOSE 113* 79 102* 140*  BUN _1 CALCIUM 7.8* 8.0* 8.1* 7.9*  CREATININE 0.93 0.96 1.06 1.14  GFRNONAA >60 >60 >60 >60  GFRAA >60 >60 >60 >60    LIVER FUNCTION TESTS: Recent Labs     10/12/18 0516 10/16/18 1236 10/19/18 0529 10/20/18 0538  BILITOT 1.0 1.1 0.8 0.8  AST 72* 40 15 16  ALT 61* 39 15 14  ALKPHOS 192* 237* 234* 216*  PROT 5.1* 6.7 5.7* 5.7*  ALBUMIN 2.0* 3.0* 2.4* 2.4*    TUMOR MARKERS: No results for input(s): AFPTM, CEA, CA199, CHROMGRNA in the last 8760 hours.  Assessment and Plan:  Richard Gordon is a 67 y.o. male with past medical history significant for diabetes, stage III chronic kidney disease, hypertension, hyperlipidemia and stroke who presents now for CT-guided bone marrow biopsy and aspiration for evaluation of diffuse infiltrative marrow process seen on preceding thoracic and lumbar spine MRI.  Risks and benefits of CT-guided bone marrow biopsy and aspiration was discussed with the patient including, but not limited to bleeding, infection, damage to adjacent structures or low yield requiring additional tests.  All of the patient's questions were answered, patient is agreeable to proceed.  Consent signed and in chart.  Thank you for this interesting consult.  I greatly enjoyed meeting Slevin Gunby Theurer and look forward to participating in their care.  A copy of this report was sent to the requesting provider on this date.  Electronically Signed: Sandi Mariscal, MD 10/23/2018, 9:10 AM   I spent a total of 20 Minutes in face to face in clinical consultation, greater than 50% of which was counseling/coordinating care for CT-guided bone marrow biopsy and aspiration.

## 2018-10-23 NOTE — Progress Notes (Signed)
eeg completed

## 2018-10-23 NOTE — Clinical Social Work Note (Signed)
CSW presented bed offers to patient and he chose [facility].  CSW contacted [employer], they will start insurance authorization.  CSW to continue to follow patient's progress throughout discharge planning.  Jones Broom. [place], MSW, [place]  10/23/2018 5:51 PM

## 2018-10-23 NOTE — Care Management Note (Signed)
Case Management Note  Patient Details  Name: Richard Gordon MRN: [id number] Date of Birth: [birth year]  Subjective/Objective:      [name] with CIR assessed patient this morning.  Patient has decided he would like to discharge to SNF instead.  [name], CSW made aware.  S/P bone marrow aspirate today.  Pending neurology for abnormal findings on head CT.             Action/Plan:   Expected Discharge Date:                  Expected Discharge Plan:  Skilled Nursing Facility  In-House Referral:  Clinical Social Work  Discharge planning Services  CM Consult  Post Acute Care Choice:    Choice offered to:     DME Arranged:    DME Agency:     HH Arranged:    [facility] Agency:     Status of Service:  Completed, signed off  If discussed at [employer], dates discussed:    Additional Comments:  [name], RN 10/23/2018, 2:38 PM

## 2018-10-23 NOTE — Progress Notes (Signed)
Inpatient Rehabilitation-Admissions Coordinator   I met with pt at the bedside to discuss CIR program and determine appropriateness of CIR as post acute venue. AC discussed the difference between post acute venues and had lengthy discussion regarding assist available at DC. Pt did not want AC contacting his brother to see if he could provide any support at DC as they "are not very close." Due to limited support, anticipation that pt will need assistance with antibiotic regime due to current mentation, and patient preferring "longer term rehab," [facility] would recommend SNF for post acute rehab.   AC will communicate recommendations with CM and will sign off.   Please call if questions.   Jhonnie Garner, OTR/L  Rehab Admissions Coordinator  [phone] 10/23/2018 9:42 AM

## 2018-10-23 NOTE — Progress Notes (Signed)
[place] [facility] [place] NAME: Richard Gordon    MR#:  [id number]  DATE OF BIRTH:  05-14-1951  SUBJECTIVE:  CHIEF COMPLAINT:   Chief Complaint  Patient presents with  . Muscle Pain  Patient seen and evaluated today Currently has peripheral line for IV antibiotics Has a telemetry sitter for safety evaluation Tolerating diet well No nausea or vomiting  REVIEW OF SYSTEMS:    ROS  CONSTITUTIONAL: No documented fever.  Has fatigue, weakness. No weight gain, no weight loss.  EYES: No blurry or double vision.  ENT: No tinnitus. No postnasal drip. No redness of the oropharynx.  RESPIRATORY: No cough, no wheeze, no hemoptysis. No dyspnea.  CARDIOVASCULAR: No chest pain. No orthopnea. No palpitations. No syncope.  GASTROINTESTINAL: No nausea, no vomiting or diarrhea. No abdominal pain. No melena or hematochezia.  GENITOURINARY: No dysuria or hematuria.  ENDOCRINE: No polyuria or nocturia. No heat or cold intolerance.  HEMATOLOGY: No anemia. No bruising. No bleeding.  INTEGUMENTARY: No rashes. No lesions.  MUSCULOSKELETAL: No arthritis. No swelling. No gout.  NEUROLOGIC: No numbness, tingling, or ataxia. No seizure-type activity.  PSYCHIATRIC: No anxiety. No insomnia. No ADD.   DRUG ALLERGIES:  No Known Allergies  VITALS:  Blood pressure (!) 121/53, pulse 97, temperature 98.2 F (36.8 C), temperature source Oral, resp. rate (!) 23, height 5' 10" (1.778 m), weight 66.3 kg, SpO2 94 %.  PHYSICAL EXAMINATION:   Physical Exam  GENERAL:  66 y.o.-year-old patient lying in the bed with no acute distress.  EYES: Pupils equal, round, reactive to light and accommodation. No scleral icterus. Extraocular muscles intact.  HEENT: Head atraumatic, normocephalic. Oropharynx and nasopharynx clear.  NECK:  Supple, no jugular venous distention. No thyroid enlargement, no tenderness.  LUNGS: Normal breath sounds bilaterally, no wheezing, rales, rhonchi. No use of  accessory muscles of respiration.  CARDIOVASCULAR: S1, S2 normal. No murmurs, rubs, or gallops.  ABDOMEN: Soft, nontender, nondistended. Bowel sounds present. No organomegaly or mass.  EXTREMITIES: No cyanosis, clubbing or edema b/l.    NEUROLOGIC: Cranial nerves II through XII are intact. No focal Motor or sensory deficits b/l.   PSYCHIATRIC: The patient is alert and oriented x 3.  SKIN: No obvious rash, lesion, or ulcer.   LABORATORY PANEL:   CBC Recent Labs  Lab 10/23/18 0542  WBC 18.9*  HGB 8.4*  HCT 26.5*  PLT 184   ------------------------------------------------------------------------------------------------------------------ Chemistries  Recent Labs  Lab 10/17/18 0528  10/20/18 0538  10/23/18 0542  NA 136   < > 136   < > 135  K 3.3*   < > 3.2*   < > 3.3*  CL 103   < > 101   < > 100  CO2 23   < > 27   < > 27  GLUCOSE 131*   < > 79   < > 140*  BUN 13   < > 15   < > 13  CREATININE 0.98   < > 0.96   < > 1.14  CALCIUM 7.6*   < > 8.0*   < > 7.9*  MG 1.7  --   --   --   --   AST  --    < > 16  --   --   ALT  --    < > 14  --   --   ALKPHOS  --    < > 216*  --   --   BILITOT  --    < >  0.8  --   --    < > = values in this interval not displayed.   ------------------------------------------------------------------------------------------------------------------  Cardiac Enzymes Recent Labs  Lab 10/16/18 1236  TROPONINI <0.03   ------------------------------------------------------------------------------------------------------------------  RADIOLOGY:  Ct Head Wo Contrast  Result Date: 10/22/2018 CLINICAL DATA:  67 year old male with altered level of consciousness. Subsequent encounter. EXAM: CT HEAD WITHOUT CONTRAST TECHNIQUE: Contiguous axial images were obtained from the base of the skull through the vertex without intravenous contrast. COMPARISON:  10/19/2018 MR. 10/11/2018 and 06/21/2011 CT. FINDINGS: Brain: No intracranial hemorrhage or CT evidence of  large acute infarct. Remote left parietal lobe infarct. Marked cerebellar and brainstem atrophy (may represent result of neurodegenerative disorder). Moderate supratentorial atrophy. 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 No intracranial mass lesion noted on this unenhanced exam. Vascular: Vascular calcifications Skull: No acute abnormality Sinuses/Orbits: No acute orbital abnormality. Mild mucosal thickening sphenoid sinuses. Other: Visualized mastoid air cells and middle ear cavities are clear. IMPRESSION: 1. No intracranial hemorrhage or CT evidence of large acute infarct. 2. Remote left parietal lobe infarct. 3. Marked cerebellar and brainstem atrophy (may represent result of neurodegenerative disorder). Moderate supratentorial atrophy. 4. 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 5. Mild mucosal thickening sphenoid sinuses. Electronically Signed   By: Genia Del M.D.   On: 10/22/2018 17:45   Ct Bone Marrow Biopsy & Aspiration  Result Date: 10/23/2018 INDICATION: Diffuse infiltrative marrow process. Please perform CT-guided bone marrow biopsy for tissue diagnostic purposes. EXAM: CT-GUIDED BONE MARROW BIOPSY AND ASPIRATION MEDICATIONS: None ANESTHESIA/SEDATION: Fentanyl 25 mcg IV; Versed 1 mg IV Sedation Time: 11 Minutes; The patient was continuously monitored during the procedure by the interventional radiology nurse under my direct supervision. COMPLICATIONS: None immediate. PROCEDURE: Informed consent was obtained from the patient following an explanation of the procedure, risks, benefits and alternatives. The patient understands, agrees and consents for the procedure. All questions were addressed. A time out was performed prior to the initiation of the procedure. The patient was positioned prone and non-contrast localization CT was performed of the  pelvis to demonstrate the iliac marrow spaces. The operative site was prepped and draped in the usual sterile fashion. Under sterile conditions and local anesthesia, a 22 gauge spinal needle was utilized for procedural planning. Next, an 11 gauge coaxial bone biopsy needle was advanced into the right iliac marrow space. Note, the right marrow space was selected for but biopsy given its more pronounced infiltrative appearance. Needle position was confirmed with CT imaging. Initially, bone marrow aspiration was attempted however no marrow was able to be aspirated. Next, a bone marrow biopsy was obtained with the 11 gauge outer bone marrow device. The 11 gauge coaxial bone biopsy needle was re-advanced into a slightly different location within the left iliac marrow space, appropriate position was again confirmed however again, no marrow was able to be aspirated. An additional bone marrow biopsy was obtained. Samples were prepared with the cytotechnologist and deemed adequate. The needle was removed intact. Hemostasis was obtained with compression and a dressing was placed. The patient tolerated the procedure well without immediate post procedural complication. IMPRESSION: Successful CT guided left iliac bone marrow core biopsy. No marrow was able to be aspirated from either bone marrow biopsy sites. Electronically Signed   By: Sandi Mariscal M.D.   On: 10/23/2018 10:11     ASSESSMENT AND PLAN:  67 year old male patient with history  of diabetes mellitus type 2, hypertension, CVA currently under hospitalist service for endocarditis  -Delirium versus encephalopathy could be from frontal lobe anomaly CT head reviewed Has cerebellar and brainstem atrophy Frontal lobe anomaly Will await neurology input  -Aortic valve endocarditis Culture-negative endocarditis Status post infectious disease consultation and follow-up Continue IV Rocephin antibiotic and IV vancomycin Patient will need antibiotics for 6 weeks  duration of total antibiotic  Blood cultures no growth so far  -Lumbosacral spine infiltrating lesion versus metastatic disease Appreciate oncology evaluation Bone marrow biopsy done today  CT chest shows Diffuse/heterogeneous osseous sclerosis involving the visualized axial and appendicular skeleton. Differential considerations include renal osteodystrophy, diffuse osseous metastases secondary to malignancy (most commonly prostate cancer), or infiltrative disorders including lymphoma.  -Acute hypokalemia improved Replaced potassium orally  -Hypertension with tachycardia Continue Cardizem CD along with lisinopril  -History of CVA Continue aspirin  -DVT prophylaxis subcu Lovenox daily  -Leukocytosis secondary to endocarditis improving Infectious disease follow-up done and appreciated  All the records are reviewed and case discussed with Care Management/Social Worker. Management plans discussed with the patient, family and they are in agreement.  CODE STATUS: Full code  DVT Prophylaxis: SCDs  TOTAL TIME TAKING CARE OF THIS PATIENT: 32 minutes.   POSSIBLE D/C IN 2 to 3 DAYS, DEPENDING ON CLINICAL CONDITION.  Saundra Shelling M.D on 10/23/2018 at 11:59 AM  Between 7am to 6pm - Pager - [phone]  After 6pm go to [email or web address] - password [email or web address] [facility]  Office  [phone]  CC: Primary care physician; Leone Haven, MD  Note: This dictation was prepared with Dragon dictation along with smaller phrase technology. Any transcriptional errors that result from this process are unintentional.

## 2018-10-24 DIAGNOSIS — I10 Essential (primary) hypertension: Secondary | ICD-10-CM

## 2018-10-24 DIAGNOSIS — R4189 Other symptoms and signs involving cognitive functions and awareness: Secondary | ICD-10-CM

## 2018-10-24 DIAGNOSIS — R748 Abnormal levels of other serum enzymes: Secondary | ICD-10-CM

## 2018-10-24 DIAGNOSIS — K047 Periapical abscess without sinus: Secondary | ICD-10-CM

## 2018-10-24 DIAGNOSIS — E119 Type 2 diabetes mellitus without complications: Secondary | ICD-10-CM

## 2018-10-24 DIAGNOSIS — N179 Acute kidney failure, unspecified: Secondary | ICD-10-CM

## 2018-10-24 LAB — MULTIPLE MYELOMA PANEL, SERUM
Albumin SerPl Elph-Mcnc: 2.4 g/dL — ABNORMAL LOW (ref 2.9–4.4)
Albumin/Glob SerPl: 0.9 (ref 0.7–1.7)
Alpha 1: 0.4 g/dL (ref 0.0–0.4)
Alpha2 Glob SerPl Elph-Mcnc: 0.9 g/dL (ref 0.4–1.0)
B-Globulin SerPl Elph-Mcnc: 0.5 g/dL — ABNORMAL LOW (ref 0.7–1.3)
Gamma Glob SerPl Elph-Mcnc: 0.8 g/dL (ref 0.4–1.8)
Globulin, Total: 2.7 g/dL (ref 2.2–3.9)
IgA: 252 mg/dL (ref 61–437)
IgG (Immunoglobin G), Serum: 777 mg/dL (ref 700–1600)
IgM (Immunoglobulin M), Srm: 68 mg/dL (ref 20–172)
Total Protein ELP: 5.1 g/dL — ABNORMAL LOW (ref 6.0–8.5)

## 2018-10-24 LAB — CBC
HCT: 26.5 % — ABNORMAL LOW (ref 39.0–52.0)
HEMOGLOBIN: 8.5 g/dL — AB (ref 13.0–17.0)
MCH: 30 pg (ref 26.0–34.0)
MCHC: 32.1 g/dL (ref 30.0–36.0)
MCV: 93.6 fL (ref 80.0–100.0)
NRBC: 0.2 % (ref 0.0–0.2)
PLATELETS: 194 10*3/uL (ref 150–400)
RBC: 2.83 MIL/uL — AB (ref 4.22–5.81)
RDW: 16 % — ABNORMAL HIGH (ref 11.5–15.5)
WBC: 20.7 10*3/uL — ABNORMAL HIGH (ref 4.0–10.5)

## 2018-10-24 LAB — GLUCOSE, CAPILLARY
GLUCOSE-CAPILLARY: 133 mg/dL — AB (ref 70–99)
GLUCOSE-CAPILLARY: 185 mg/dL — AB (ref 70–99)
GLUCOSE-CAPILLARY: 134 mg/dL — AB (ref 70–99)
Glucose-Capillary: 104 mg/dL — ABNORMAL HIGH (ref 70–99)
Glucose-Capillary: 120 mg/dL — ABNORMAL HIGH (ref 70–99)

## 2018-10-24 LAB — THYROID PANEL
FREE THYROXINE INDEX: 2.1 (ref 1.2–4.9)
T3 UPTAKE RATIO: 30 % (ref 24–39)
T4, Total: 7 ug/dL (ref 4.5–12.0)

## 2018-10-24 LAB — PROCALCITONIN: Procalcitonin: 0.37 ng/mL

## 2018-10-24 LAB — VANCOMYCIN, TROUGH: VANCOMYCIN TR: 22 ug/mL — AB (ref 15–20)

## 2018-10-24 MED ORDER — METRONIDAZOLE IN NACL 5-0.79 MG/ML-% IV SOLN
500.0000 mg | Freq: Three times a day (TID) | INTRAVENOUS | Status: DC
  Administered 2018-10-24 – 2018-10-25 (×5): 500 mg via INTRAVENOUS
  Filled 2018-10-24 (×7): qty 100

## 2018-10-24 NOTE — Progress Notes (Signed)
Occupational Therapy Treatment Patient Details Name: TULLY MCINTURFF MRN: [id number] DOB: 06/26/1951 Today's Date: 10/24/2018    History of present illness Pt is a 67 y/o M who presented with stiff legs, difficulty walking, weakness, fatigue.  Pt admitted for endocarditis.  Recent imaging suggestive of possible neurodegenerative disorder vs hypermetabolic state/metastatic disease.  Pts PMH includes stroke x2, syncope.    OT comments  Pt seen for OT treatment this date.  Pt presented in recliner asking to move to bed after 10 minutes sitting in recliner. Pt donned pants sit to stand requiring MIN A and VC for hand/foot placement with RW. Pt demonstrated impaired cognition with problem solving while trying don pants while remaining sitting in recliner. Needed VC and extra time to sequence the task. Pt had slow processing of self care tasks required in the AM requiring VC to state getting dressed. Pt able to estimate approx time of task to don pants. Pt would benefit from skilled OT to address noted impairments and functional limitations (see below for any additional details) in order to maximize safety and independence while minimizing falls risk and caregiver burden.  OT recommends CIR upon discharge.    Follow Up Recommendations  CIR    Equipment Recommendations       Recommendations for Other Services      Precautions / Restrictions Precautions Precautions: Fall Restrictions Weight Bearing Restrictions: No       Mobility Bed Mobility Overal bed mobility: Modified Independent Bed Mobility: Sit to Supine     Sit to supine: Modified independent (Device/Increase time)    Transfers Overall transfer level: Needs assistance Equipment used: Rolling walker (2 wheeled) Transfers: Sit to/from Stand Sit to Stand: Min assist(from recliner)            Balance Overall balance assessment: Needs assistance Sitting-balance support: No upper extremity supported;Feet supported Sitting  balance-Leahy Scale: Fair     Standing balance support: Single extremity supported;During functional activity Standing balance-Leahy Scale: Fair                             ADL either performed or assessed with clinical judgement   ADL                       Lower Body Dressing: Sit to/from stand;Cueing for safety;Cueing for sequencing;Minimal assistance Lower Body Dressing Details (indicate cue type and reason): Pt required VC for hand/foot placement with RW to initiate sitting back down.                     Vision       Perception     Praxis      Cognition Arousal/Alertness: Awake/alert Behavior During Therapy: WFL for tasks assessed/performed Overall Cognitive Status: Impaired/Different from baseline Area of Impairment: Problem solving                       Safety/Judgement: Decreased awareness of safety   Problem Solving: Slow processing;Difficulty sequencing;Requires verbal cues General Comments: Pt tried to don pants while remaining sitting in recliner. Needed VC and extra time to sequence the task. Pt had difficulty stating self care tasks required in the AM requiring VC to state getting dressed. Pt able to estimate approx time of task to don pants.          Exercises General Exercises - Lower Extremity Ankle Circles/Pumps: AROM;Both;20 reps;Supine Long Arc Quad: AROM;Strengthening;Both;10  reps Heel Slides: AROM;Both;Supine;20 reps Straight Leg Raises: AROM;Strengthening;Both;10 reps;Supine Hip Flexion/Marching: AROM;Both;15 reps   Shoulder Instructions       General Comments      Pertinent Vitals/ Pain       Pain Assessment: No/denies pain   Home Living                                          Prior Functioning/Environment              Frequency  Min 3X/week        Progress Toward Goals  OT Goals(current goals can now be found in the care plan section)  Progress towards OT goals:  Progressing toward goals  Acute Rehab OT Goals Patient Stated Goal: get back home Time For Goal Achievement: 11/07/18 Potential to Achieve Goals: Good  Plan Discharge plan remains appropriate;Frequency remains appropriate    Co-evaluation                 AM-PAC PT "6 Clicks" Daily Activity     Outcome Measure   Help from another person eating meals?: None Help from another person taking care of personal grooming?: None Help from another person toileting, which includes using toliet, bedpan, or urinal?: A Little Help from another person bathing (including washing, rinsing, drying)?: A Little Help from another person to put on and taking off regular upper body clothing?: None Help from another person to put on and taking off regular lower body clothing?: A Little 6 Click Score: 21    End of Session Equipment Utilized During Treatment: Gait belt;Rolling walker  OT Visit Diagnosis: Muscle weakness (generalized) (M62.81);Other abnormalities of gait and mobility (R26.89)   Activity Tolerance Patient tolerated treatment well   Patient Left in bed;with call bell/phone within reach;with bed alarm set   Nurse Communication          Time: 1610-9604 OT Time Calculation (min): 26 min  Charges:    Jadene Pierini OTS   10/24/2018, 5:01 PM

## 2018-10-24 NOTE — Progress Notes (Signed)
Subjective: Patient unchanged  Objective: Current vital signs: BP (!) 116/56 (BP Location: Left Arm)   Pulse (!) 101   Temp 98.6 F (37 C) (Oral)   Resp 20   Ht 5' 10" (1.778 m)   Wt 66.3 kg   SpO2 94%   BMI 20.98 kg/m  Vital signs in last 24 hours: Temp:  [98.4 F (36.9 C)-99.6 F (37.6 C)] 98.6 F (37 C) (11/20 5409) Pulse Rate:  [91-113] 101 (11/20 0822) Resp:  [20] 20 (11/20 0822) BP: (101-135)/(53-64) 116/56 (11/20 0822) SpO2:  [93 %-97 %] 94 % (11/20 0822)  Intake/Output from previous day: 11/19 0701 - 11/20 0700 In: 735.6 [I.V.:40.3; IV Piggyback:695.4] Out: 625 [Urine:625] Intake/Output this shift: No intake/output data recorded. Nutritional status:  Diet Order            Diet regular Room service appropriate? Yes; Fluid consistency: Thin  Diet effective now              Neurologic Exam: Mental Status: Alert.  Reported it was 67. Speech fluent without evidence of aphasia. Able to follow 3 step commands without difficulty. Attention span and concentration seemed appropriate  Cranial Nerves: II: Discs flat bilaterally; Visual fields grossly normal, pupils equal, round, reactive to light and accommodation III,IV, VI: ptosis not present, extra-ocular motions intact bilaterally V,VII: smile symmetric, facial light touch sensationintact VIII: hearing normal bilaterally IX,X: gag reflex present XI: bilateral shoulder shrug XII: midline tongue extension Motor: 5/5 throughout  Lab Results: Basic Metabolic Panel: Recent Labs  Lab 10/19/18 0529 10/20/18 0538 10/21/18 0459 10/23/18 0542  NA 136 136 137 135  K 3.5 3.2* 3.7 3.3*  CL 100 101 101 100  CO2 _0 GLUCOSE 113* 79 102* 140*  BUN _1 CREATININE 0.93 0.96 1.06 1.14  CALCIUM 7.8* 8.0* 8.1* 7.9*    Liver Function Tests: Recent Labs  Lab 10/19/18 0529 10/20/18 0538  AST 15 16  ALT 15 14  ALKPHOS 234* 216*  BILITOT 0.8 0.8  PROT 5.7* 5.7*  ALBUMIN 2.4* 2.4*    Recent Labs  Lab 10/19/18 0529  LIPASE 26   Recent Labs  Lab 10/22/18 1724  AMMONIA 20    CBC: Recent Labs  Lab 10/20/18 0538 10/21/18 0459 10/22/18 0721 10/23/18 0542 10/24/18 0801  WBC 14.9* 18.6* 20.3* 18.9* 20.7*  NEUTROABS 10.1*  --  13.5* 12.0*  --   HGB 8.9* 9.3* 8.7* 8.4* 8.5*  HCT 28.0* 29.7* 27.5* 26.5* 26.5*  MCV 93.0 94.9 93.2 93.0 93.6  PLT 173 206 204 184 194    Cardiac Enzymes: No results for input(s): CKTOTAL, CKMB, CKMBINDEX, TROPONINI in the last 168 hours.  Lipid Panel: No results for input(s): CHOL, TRIG, HDL, CHOLHDL, VLDL, LDLCALC in the last 168 hours.  CBG: Recent Labs  Lab 10/23/18 1717 10/23/18 2105 10/23/18 2208 10/24/18 0824 10/24/18 1147  GLUCAP 143* 133* 185* 104* 134*    Microbiology: Results for orders placed or performed during the hospital encounter of 10/16/18  Culture, blood (routine x 2)     Status: None   Collection Time: 10/16/18  3:23 PM  Result Value Ref Range Status   Specimen Description BLOOD RIGHT ANTECUBITAL  Final   Special Requests   Final    BOTTLES DRAWN AEROBIC AND ANAEROBIC Blood Culture adequate volume   Culture   Final    NO GROWTH 5 DAYS Performed at [facility], [address] [place], [place] [address]  Report Status 10/21/2018 FINAL  Final  Culture, blood (routine x 2)     Status: None   Collection Time: 10/16/18  3:24 PM  Result Value Ref Range Status   Specimen Description BLOOD BLOOD LEFT HAND  Final   Special Requests   Final    BOTTLES DRAWN AEROBIC AND ANAEROBIC Blood Culture results may not be optimal due to an excessive volume of blood received in culture bottles   Culture   Final    NO GROWTH 5 DAYS Performed at [facility], [address], [place], [place] [address]    Report Status 10/21/2018 FINAL  Final    Coagulation Studies: No results for input(s): LABPROT, INR in the last 72 hours.  Imaging: Ct Head Wo Contrast  Result Date:  10/22/2018 CLINICAL DATA:  67 year old male with altered level of consciousness. Subsequent encounter. EXAM: CT HEAD WITHOUT CONTRAST TECHNIQUE: Contiguous axial images were obtained from the base of the skull through the vertex without intravenous contrast. COMPARISON:  10/19/2018 MR. 10/11/2018 and 06/21/2011 CT. FINDINGS: Brain: No intracranial hemorrhage or CT evidence of large acute infarct. Remote left parietal lobe infarct. Marked cerebellar and brainstem atrophy (may represent result of neurodegenerative disorder). Moderate supratentorial atrophy. 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 No intracranial mass lesion noted on this unenhanced exam. Vascular: Vascular calcifications Skull: No acute abnormality Sinuses/Orbits: No acute orbital abnormality. Mild mucosal thickening sphenoid sinuses. Other: Visualized mastoid air cells and middle ear cavities are clear. IMPRESSION: 1. No intracranial hemorrhage or CT evidence of large acute infarct. 2. Remote left parietal lobe infarct. 3. Marked cerebellar and brainstem atrophy (may represent result of neurodegenerative disorder). Moderate supratentorial atrophy. 4. 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 5. Mild mucosal thickening sphenoid sinuses. Electronically Signed   By: Genia Del M.D.   On: 10/22/2018 17:45   Ct Bone Marrow Biopsy & Aspiration  Result Date: 10/23/2018 INDICATION: Diffuse infiltrative marrow process. Please perform CT-guided bone marrow biopsy for tissue diagnostic purposes. EXAM: CT-GUIDED BONE MARROW BIOPSY AND ASPIRATION MEDICATIONS: None ANESTHESIA/SEDATION: Fentanyl 25 mcg IV; Versed 1 mg IV Sedation Time: 11 Minutes; The patient was continuously monitored during the procedure by the interventional radiology nurse under my direct supervision. COMPLICATIONS: None immediate.  PROCEDURE: Informed consent was obtained from the patient following an explanation of the procedure, risks, benefits and alternatives. The patient understands, agrees and consents for the procedure. All questions were addressed. A time out was performed prior to the initiation of the procedure. The patient was positioned prone and non-contrast localization CT was performed of the pelvis to demonstrate the iliac marrow spaces. The operative site was prepped and draped in the usual sterile fashion. Under sterile conditions and local anesthesia, a 22 gauge spinal needle was utilized for procedural planning. Next, an 11 gauge coaxial bone biopsy needle was advanced into the right iliac marrow space. Note, the right marrow space was selected for but biopsy given its more pronounced infiltrative appearance. Needle position was confirmed with CT imaging. Initially, bone marrow aspiration was attempted however no marrow was able to be aspirated. Next, a bone marrow biopsy was obtained with the 11 gauge outer bone marrow device. The 11 gauge coaxial bone biopsy needle was re-advanced into a slightly different location within the left iliac marrow space, appropriate position was again confirmed however again, no marrow was able to be aspirated. An additional bone marrow  biopsy was obtained. Samples were prepared with the cytotechnologist and deemed adequate. The needle was removed intact. Hemostasis was obtained with compression and a dressing was placed. The patient tolerated the procedure well without immediate post procedural complication. IMPRESSION: Successful CT guided left iliac bone marrow core biopsy. No marrow was able to be aspirated from either bone marrow biopsy sites. Electronically Signed   By: Sandi Mariscal M.D.   On: 10/23/2018 10:11    Medications:  I have reviewed the patient's current medications. Scheduled: . aspirin EC  81 mg Oral Daily  . diltiazem  30 mg Oral Q8H  . docusate sodium  100 mg Oral  BID  . enoxaparin (LOVENOX) injection  40 mg Subcutaneous Q24H  . feeding supplement (GLUCERNA SHAKE)  237 mL Oral TID BM  . insulin aspart  0-15 Units Subcutaneous TID WC  . insulin aspart  0-5 Units Subcutaneous QHS  . insulin glargine  20 Units Subcutaneous QHS  . lisinopril  20 mg Oral Daily  . metoprolol tartrate  25 mg Oral BID  . sodium chloride flush  3 mL Intravenous Q12H    Assessment/Plan: Patient remains altered.  Likely multifactorial and related to his multiple medical issues.  EEG normal.  B12, TSH normal.  B1 pending.    Agree with current management   LOS: 8 days   Alexis Goodell, MD Neurology [phone] 10/24/2018  12:47 PM

## 2018-10-24 NOTE — Progress Notes (Signed)
Subjective He has no new complaints   Objective:  VITALS:  BP (!) 116/56 (BP Location: Left Arm)   Pulse (!) 101   Temp 98.6 F (37 C) (Oral)   Resp 20   Ht 5' 10" (1.778 m)   Wt 66.3 kg   SpO2 94%   BMI 20.98 kg/m  PHYSICAL EXAM:  General: Alert, cooperative, no distress, . Pale, does not remember date or year Head: Normocephalic, without obvious abnormality, atraumatic. Eyes: Conjunctivae clear, anicteric sclerae. Pupils are equal ENT Nares normal. No drainage or sinus tenderness. Lips, mucosa, and tongue normal. No Thrush Neck: Supple, symmetrical, no adenopathy, thyroid: non tender no carotid bruit and no JVD. Back: No CVA tenderness. Lungs: Clear to auscultation bilaterally. No Wheezing or Rhonchi. No rales. Heart: Regular rate and rhythm, no murmur, rub or gallop. Abdomen: Soft, non-tender,not distended. Bowel sounds normal. No masses Extremities: atraumatic, no cyanosis. No edema. No clubbing Skin: No rashes or lesions. Or bruising Lymph: Cervical, supraclavicular normal. Neurologic: Grossly non-focal, plantar downgoing Pertinent Labs Lab Results CBC    Component Value Date/Time   WBC 20.7 (H) 10/24/2018 0801   RBC 2.83 (L) 10/24/2018 0801   HGB 8.5 (L) 10/24/2018 0801   HGB 14.6 07/10/2013 2023   HCT 26.5 (L) 10/24/2018 0801   HCT 40.2 07/10/2013 2023   PLT 194 10/24/2018 0801   PLT 241 07/10/2013 2023   MCV 93.6 10/24/2018 0801   MCV 93 07/10/2013 2023   MCH 30.0 10/24/2018 0801   MCHC 32.1 10/24/2018 0801   RDW 16.0 (H) 10/24/2018 0801   RDW 12.9 07/10/2013 2023   LYMPHSABS 1.6 10/23/2018 0542   MONOABS 2.6 (H) 10/23/2018 0542   EOSABS 0.1 10/23/2018 0542   BASOSABS 0.1 10/23/2018 0542   CBC Latest Ref Rng & Units 10/24/2018 10/23/2018 10/22/2018  WBC 4.0 - 10.5 K/uL 20.7(H) 18.9(H) 20.3(H)  Hemoglobin 13.0 - 17.0 g/dL 8.5(L) 8.4(L) 8.7(L)  Hematocrit 39.0 - 52.0 % 26.5(L) 26.5(L) 27.5(L)  Platelets 150 - 400 K/uL 194 184 204   CMP Latest Ref  Rng & Units 10/23/2018 10/21/2018 10/20/2018  Glucose 70 - 99 mg/dL 140(H) 102(H) 79  BUN 8 - 23 mg/dL _0 Creatinine 0.61 - 1.24 mg/dL 1.14 1.06 0.96  Sodium 135 - 145 mmol/L 135 137 136  Potassium 3.5 - 5.1 mmol/L 3.3(L) 3.7 3.2(L)  Chloride 98 - 111 mmol/L 100 101 101  CO2 22 - 32 mmol/L _1 Calcium 8.9 - 10.3 mg/dL 7.9(L) 8.1(L) 8.0(L)  Total Protein 6.5 - 8.1 g/dL - - 5.7(L)  Total Bilirubin 0.3 - 1.2 mg/dL - - 0.8  Alkaline Phos 38 - 126 U/L - - 216(H)  AST 15 - 41 U/L - - 16  ALT 0 - 44 U/L - - 14      Microbiology: Recent Results (from the past 240 hour(s))  Culture, blood (routine x 2)     Status: None   Collection Time: 10/16/18  3:23 PM  Result Value Ref Range Status   Specimen Description BLOOD RIGHT ANTECUBITAL  Final   Special Requests   Final    BOTTLES DRAWN AEROBIC AND ANAEROBIC Blood Culture adequate volume   Culture   Final    NO GROWTH 5 DAYS Performed at [facility], [address] [place], [place] [address]    Report Status 10/21/2018 FINAL  Final  Culture, blood (routine x 2)     Status: None   Collection Time: 10/16/18  3:24 PM  Result Value Ref Range Status   Specimen Description BLOOD BLOOD LEFT HAND  Final   Special Requests   Final    BOTTLES DRAWN AEROBIC AND ANAEROBIC Blood Culture results may not be optimal due to an excessive volume of blood received in culture bottles   Culture   Final    NO GROWTH 5 DAYS Performed at [facility], [address], [place], [place] [address]    Report Status 10/21/2018 FINAL  Final   IMAGING RESULTS: ? Impression/Recommendation ?67 y.o. male with a history of diabetes mellitus, hypertension, hyperlipidemia presents with not feeling well for the past few weeks. Initially he said it all started after he broke his tooth 4-5 weeks ago and then had a tooth extraction done a week ago and ever since has not felt well.  He has been on antibiotics after the tooth extraction he is  taking amoxicillin since 10/03/2018.  But a few minutes later he said he has not been well right from the time the tooth broke.  He never had pain at the tooth.  His main complaint this is not been able to walk very well because he feels weak in his legs.  He has poor appetite.  He says he has lost weight.  He has had some sweating.  He does not really complain of any fever.  In September he had gone to his PCP and was complaining of low back pain left leg pain which apparently is chronic but it had gotten worse at that time. Just before that he was seen in the ED on September 19 with chief complaints of generalized body aches feeling sore all over.  So not  really clear how long he  has been unwell.  He lives on his own.  He said before he walked well but of late has been walking using a cane and then a walker.  Fever and leukocytosis.  Along with fatigue and weakness for few weeks, anemia.  History of dental  issue. Aortic valve endocarditis on TEE.- culture negative    Blood culture has been negative so far -- may be from amoxicillin he was taking until the day his blood culture was drawn. But will still investigate this for other causes . Will send serology for brucella, bartonella, coxiella, chlamydia, He did have CT of the maxilla and that has shown multiple periapical abscesses   There was no obvious collection which was noted in the maxillary scan.. Needs dentist consultation vanco cefepime flagyl was narrowed to ceftriaxone but as the WBC was increasing again. Currently On vanco and ceftriaxone, added flagyl   The CT abdomen and pelvis showed diffuse soft tissue stranding in the retroperitoneal space of the abdomen around the pancreas adrenal glands and kidneys.  Unclear what this process is ?  Marland Kitchen Because of complains of weakness in his legs and inability to walk he got MRI of the Thoraco/lumbar spine . It shows no infection but  Diffusely abnormal bone marrow signal within the thoracolumbar and  included sacral spine with heterogeneous enhancement highly concerning for infiltrative tumor (infiltrative metastatic disease versus myeloproliferative disorder or leukemia/lymphoma). No pathologic fracture. Atypical appearance for infection.  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 Old small LEFT parietal/MCA territory infarct. Old RIGHT thalamus infarct.4. 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   Anemia since November 2019. .?  Anemia secondary to infection versus other cause.  Increase in alkaline phophatase-   ? Bone  ?  CKD- much improved  Diabetes mellitus  Hypertension management as per primary team. ? ? ___________________________________________________ Discussed with Dr.Reddy

## 2018-10-24 NOTE — Progress Notes (Addendum)
Physical Therapy Treatment Patient Details Name: Richard Gordon MRN: [id number] DOB: 17-Feb-1951 Today's Date: 10/24/2018    History of Present Illness Pt is a 67 y/o M who presented with stiff legs, difficulty walking, weakness, fatigue.  Pt admitted for endocarditis.  Recent imaging suggestive of possible neurodegenerative disorder vs hypermetabolic state/metastatic disease.  Pts PMH includes stroke x2, syncope.     PT Comments    Patient in bed, oriented to self, place, disoriented to time, agreeable to PT, reports that bilateral LE "ache", 8/10. Patient able to participate in therapeutic exercises with moderate verbal cues for activity pacing and to maximize exercise technique/form. BP and HR monitored throughout session, see below for details. Patient mobilized to EOB with minAx1 for LLE management, sat EOB ~42mns with FAIR balance and at least 1 UE supported. Patient performed sit <> stand x2 with RW, heavy use of RW to stand with minAx1 to maintain initial standing balance. Patient able to ambulate ~361fto chair, noticeably fatigued, shaky, minAx1 with RW. Maximum assistance needed for AD management, Pt inclined to lift RW while ambulating. Patient in chair with all needs in reach and nursing at bedside at end of session. The patient would benefit from further skilled PT to continue to progress towards goals to maximize independence, mobility, gait, and safety.   Supine vitals: 115/73, HR: 85 SpO2: 96% Sitting vitals: 90/75, HR: 110 SpO2: 94% Standing vitals: 126/84, HR: 130 SpO2: 90%    Follow Up Recommendations  CIR     Equipment Recommendations  Rolling walker with 5" wheels    Recommendations for Other Services       Precautions / Restrictions Precautions Precautions: Fall Restrictions Weight Bearing Restrictions: No    Mobility  Bed Mobility   Bed Mobility: Supine to Sit     Supine to sit: Min assist     General bed mobility comments: minAx1 for LE  management  Transfers Overall transfer level: Needs assistance Equipment used: Rolling walker (2 wheeled) Transfers: Sit to/from Stand Sit to Stand: Min assist;From elevated surface         General transfer comment: Needs assistance to steady RW, uses Ue heavily. Deficits with initial standing balance, CGA to monitor  Ambulation/Gait Ambulation/Gait assistance: Min assist Gait Distance (Feet): 3 Feet Assistive device: Rolling walker (2 wheeled)      General Gait Details: broad BOS, shaky, staggering steps. difficulty with LLE management/motor control.   Stairs             Wheelchair Mobility    Modified Rankin (Stroke Patients Only)       Balance Overall balance assessment: Needs assistance Sitting-balance support: No upper extremity supported;Feet supported Sitting balance-Leahy Scale: Fair                                      Cognition Arousal/Alertness: Awake/alert Behavior During Therapy: WFL for tasks assessed/performed Overall Cognitive Status: Within Functional Limits for tasks assessed Area of Impairment: Orientation                 Orientation Level: Disoriented to;Time       Safety/Judgement: Decreased awareness of deficits            Exercises General Exercises - Lower Extremity Ankle Circles/Pumps: AROM;Both;20 reps;Supine Long Arc Quad: AROM;Strengthening;Both;10 reps Heel Slides: AROM;Both;Supine;20 reps Straight Leg Raises: AROM;Strengthening;Both;10 reps;Supine Hip Flexion/Marching: AROM;Both;15 reps    General Comments  Pertinent Vitals/Pain Pain Assessment: No/denies pain Pain Score: 8  Pain Location: BLEs Pain Intervention(s): Limited activity within patient's tolerance;Monitored during session;Repositioned    Home Living                      Prior Function            PT Goals (current goals can now be found in the care plan section) Progress towards PT goals: Progressing  toward goals    Frequency    Min 2X/week      PT Plan Current plan remains appropriate    Co-evaluation              AM-PAC PT "6 Clicks" Daily Activity  Outcome Measure  Difficulty turning over in bed (including adjusting bedclothes, sheets and blankets)?: A Little Difficulty moving from lying on back to sitting on the side of the bed? : A Little Difficulty sitting down on and standing up from a chair with arms (e.g., wheelchair, bedside commode, etc,.)?: Unable Help needed moving to and from a bed to chair (including a wheelchair)?: A Lot Help needed walking in hospital room?: A Lot Help needed climbing 3-5 steps with a railing? : Total 6 Click Score: 12    End of Session Equipment Utilized During Treatment: Gait belt Activity Tolerance: Patient tolerated treatment well Patient left: with chair alarm set;in chair;with nursing/sitter in room;with call bell/phone within reach Nurse Communication: Mobility status;Other (comment) PT Visit Diagnosis: Unsteadiness on feet (R26.81);Other abnormalities of gait and mobility (R26.89);Muscle weakness (generalized) (M62.81);Other symptoms and signs involving the nervous system (R29.898);Difficulty in walking, not elsewhere classified (R26.2)     Time: 1533-1600 PT Time Calculation (min) (ACUTE ONLY): 27 min  Charges:  $Therapeutic Exercise: 8-22 mins $Therapeutic Activity: 8-22 mins                     Lieutenant Diego PT, DPT 4:44 PM,10/24/18 [phone]

## 2018-10-24 NOTE — Consult Note (Signed)
Pharmacy Antibiotic Note  Richard Gordon is a 67 y.o. male admitted on 10/16/2018 with sepsis.  Pharmacy has been consulted for vancomycin dosing.  Plan: Vanc trough resulted at 22. However, level was drawn only 10 hr after vanc was hung (2hr early). True trough likely lower. I will continue vancomycin at the same dose and recheck a trough tomorrow AM. I have adjusted the timing on doses to match the last dose given (10 and 10).   Height: 5' 10" (177.8 cm) Weight: 146 lb 3.2 oz (66.3 kg) IBW/kg (Calculated) : 73  Temp (24hrs), Avg:99 F (37.2 C), Min:98.4 F (36.9 C), Max:99.6 F (37.6 C)  Recent Labs  Lab 10/18/18 1130  10/19/18 0529 10/20/18 0538 10/21/18 0459 10/22/18 0721 10/23/18 0542 10/24/18 0801  WBC  --    < > 14.3* 14.9* 18.6* 20.3* 18.9* 20.7*  CREATININE  --   --  0.93 0.96 1.06  --  1.14  --   VANCOTROUGH 17  --   --   --   --   --   --  22*   < > = values in this interval not displayed.    Estimated Creatinine Clearance: 58.2 mL/min (by C-G formula based on SCr of 1.14 mg/dL).    No Known Allergies  Antimicrobials this admission: vanc 11/12>>11/15, 11/18>> Cefepime 11/12>>11/14 Flagyl 11/12>> Ceftriaxone 11/14>>  Dose adjustments this admission:   Microbiology results: 11/12 BCx: NG 11/05 UCx: NG    Thank you for allowing pharmacy to be a part of this patient's care.  Ramond Dial, Pharm.D, BCPS Clinical Pharmacist 10/24/2018 9:20 AM

## 2018-10-24 NOTE — Progress Notes (Signed)
[facility] [place] NAME: Davey Limas    MR#:  [id number]  DATE OF BIRTH:  Nov 06, 1951  SUBJECTIVE:  CHIEF COMPLAINT:   Chief Complaint  Patient presents with  . Muscle Pain  Patient seen and evaluated today Currently has peripheral line for IV antibiotics Appears pleasant and not agitated Tolerating diet well No nausea or vomiting Has periods of confusion  REVIEW OF SYSTEMS:    ROS  CONSTITUTIONAL: No documented fever.  Has fatigue, weakness. No weight gain, no weight loss.  EYES: No blurry or double vision.  ENT: No tinnitus. No postnasal drip. No redness of the oropharynx.  RESPIRATORY: No cough, no wheeze, no hemoptysis. No dyspnea.  CARDIOVASCULAR: No chest pain. No orthopnea. No palpitations. No syncope.  GASTROINTESTINAL: No nausea, no vomiting or diarrhea. No abdominal pain. No melena or hematochezia.  GENITOURINARY: No dysuria or hematuria.  ENDOCRINE: No polyuria or nocturia. No heat or cold intolerance.  HEMATOLOGY: No anemia. No bruising. No bleeding.  INTEGUMENTARY: No rashes. No lesions.  MUSCULOSKELETAL: No arthritis. No swelling. No gout.  NEUROLOGIC: No numbness, tingling, or ataxia. No seizure-type activity.  PSYCHIATRIC: No anxiety. No insomnia. No ADD.   DRUG ALLERGIES:  No Known Allergies  VITALS:  Blood pressure (!) 116/56, pulse (!) 101, temperature 98.6 F (37 C), temperature source Oral, resp. rate 20, height 5' 10" (1.778 m), weight 66.3 kg, SpO2 94 %.  PHYSICAL EXAMINATION:   Physical Exam  GENERAL:  67 y.o.-year-old patient lying in the bed with no acute distress.  EYES: Pupils equal, round, reactive to light and accommodation. No scleral icterus. Extraocular muscles intact.  HEENT: Head atraumatic, normocephalic. Oropharynx and nasopharynx clear.  NECK:  Supple, no jugular venous distention. No thyroid enlargement, no tenderness.  LUNGS: Normal breath sounds bilaterally, no wheezing, rales,  rhonchi. No use of accessory muscles of respiration.  CARDIOVASCULAR: S1, S2 normal. No murmurs, rubs, or gallops.  ABDOMEN: Soft, nontender, nondistended. Bowel sounds present. No organomegaly or mass.  EXTREMITIES: No cyanosis, clubbing or edema b/l.    NEUROLOGIC: Cranial nerves II through XII are intact. No focal Motor or sensory deficits b/l.   PSYCHIATRIC: The patient is alert and oriented x 2 SKIN: No obvious rash, lesion, or ulcer.   LABORATORY PANEL:   CBC Recent Labs  Lab 10/24/18 0801  WBC 20.7*  HGB 8.5*  HCT 26.5*  PLT 194   ------------------------------------------------------------------------------------------------------------------ Chemistries  Recent Labs  Lab 10/20/18 0538  10/23/18 0542  NA 136   < > 135  K 3.2*   < > 3.3*  CL 101   < > 100  CO2 27   < > 27  GLUCOSE 79   < > 140*  BUN 15   < > 13  CREATININE 0.96   < > 1.14  CALCIUM 8.0*   < > 7.9*  AST 16  --   --   ALT 14  --   --   ALKPHOS 216*  --   --   BILITOT 0.8  --   --    < > = values in this interval not displayed.   ------------------------------------------------------------------------------------------------------------------  Cardiac Enzymes No results for input(s): TROPONINI in the last 168 hours. ------------------------------------------------------------------------------------------------------------------  RADIOLOGY:  Ct Head Wo Contrast  Result Date: 10/22/2018 CLINICAL DATA:  67 year old male with altered level of consciousness. Subsequent encounter. EXAM: CT HEAD WITHOUT CONTRAST TECHNIQUE: Contiguous axial images were obtained from the base of the skull through the vertex  without intravenous contrast. COMPARISON:  10/19/2018 MR. 10/11/2018 and 06/21/2011 CT. FINDINGS: Brain: No intracranial hemorrhage or CT evidence of large acute infarct. Remote left parietal lobe infarct. Marked cerebellar and brainstem atrophy (may represent result of neurodegenerative disorder).  Moderate supratentorial atrophy. 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 No intracranial mass lesion noted on this unenhanced exam. Vascular: Vascular calcifications Skull: No acute abnormality Sinuses/Orbits: No acute orbital abnormality. Mild mucosal thickening sphenoid sinuses. Other: Visualized mastoid air cells and middle ear cavities are clear. IMPRESSION: 1. No intracranial hemorrhage or CT evidence of large acute infarct. 2. Remote left parietal lobe infarct. 3. Marked cerebellar and brainstem atrophy (may represent result of neurodegenerative disorder). Moderate supratentorial atrophy. 4. 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 5. Mild mucosal thickening sphenoid sinuses. Electronically Signed   By: Genia Del M.D.   On: 10/22/2018 17:45   Ct Bone Marrow Biopsy & Aspiration  Result Date: 10/23/2018 INDICATION: Diffuse infiltrative marrow process. Please perform CT-guided bone marrow biopsy for tissue diagnostic purposes. EXAM: CT-GUIDED BONE MARROW BIOPSY AND ASPIRATION MEDICATIONS: None ANESTHESIA/SEDATION: Fentanyl 25 mcg IV; Versed 1 mg IV Sedation Time: 11 Minutes; The patient was continuously monitored during the procedure by the interventional radiology nurse under my direct supervision. COMPLICATIONS: None immediate. PROCEDURE: Informed consent was obtained from the patient following an explanation of the procedure, risks, benefits and alternatives. The patient understands, agrees and consents for the procedure. All questions were addressed. A time out was performed prior to the initiation of the procedure. The patient was positioned prone and non-contrast localization CT was performed of the pelvis to demonstrate the iliac marrow spaces. The operative site was prepped and draped in the usual sterile fashion. Under sterile conditions and local  anesthesia, a 22 gauge spinal needle was utilized for procedural planning. Next, an 11 gauge coaxial bone biopsy needle was advanced into the right iliac marrow space. Note, the right marrow space was selected for but biopsy given its more pronounced infiltrative appearance. Needle position was confirmed with CT imaging. Initially, bone marrow aspiration was attempted however no marrow was able to be aspirated. Next, a bone marrow biopsy was obtained with the 11 gauge outer bone marrow device. The 11 gauge coaxial bone biopsy needle was re-advanced into a slightly different location within the left iliac marrow space, appropriate position was again confirmed however again, no marrow was able to be aspirated. An additional bone marrow biopsy was obtained. Samples were prepared with the cytotechnologist and deemed adequate. The needle was removed intact. Hemostasis was obtained with compression and a dressing was placed. The patient tolerated the procedure well without immediate post procedural complication. IMPRESSION: Successful CT guided left iliac bone marrow core biopsy. No marrow was able to be aspirated from either bone marrow biopsy sites. Electronically Signed   By: Sandi Mariscal M.D.   On: 10/23/2018 10:11     ASSESSMENT AND PLAN:  67 year old male patient with history of diabetes mellitus type 2, hypertension, CVA currently under hospitalist service for endocarditis  -Delirium versus encephalopathy could be from frontal lobe anomaly Appreciate neurology follow-up B12, TSH normal No acute intervention recommended CT head reviewed Has cerebellar and brainstem atrophy Frontal lobe anomaly  -Aortic valve endocarditis Culture-negative endocarditis Status post infectious disease consultation and follow-up Continue IV Rocephin antibiotic and IV vancomycin and IV Flagyl antibiotics Patient will need antibiotics for 6 weeks duration of total antibiotic  from the start Blood cultures no growth so  far Patient has pulled out PICC line recently We will again evaluate for PICC line placement and also discussed with infectious disease speciality for any other options regarding antibiotics  -Lumbosacral spine infiltrating lesion versus metastatic disease Appreciate oncology evaluation Bone marrow biopsy done yesterday Was a dry tap Oncology follow-up  CT chest shows Diffuse/heterogeneous osseous sclerosis involving the visualized axial and appendicular skeleton. Differential considerations include renal osteodystrophy, diffuse osseous metastases secondary to malignancy (most commonly prostate cancer), or infiltrative disorders including lymphoma.  -Acute hypokalemia improved Replaced potassium orally  -Hypertension with tachycardia Continue Cardizem CD along with lisinopril  -History of CVA Continue aspirin  -DVT prophylaxis subcu Lovenox daily  -Leukocytosis secondary to endocarditis Infectious disease follow-up   -Disposition Will need SNF  All the records are reviewed and case discussed with Care Management/Social Worker. Management plans discussed with the patient, family and they are in agreement.  CODE STATUS: Full code  DVT Prophylaxis: SCDs  TOTAL TIME TAKING CARE OF THIS PATIENT: 32 minutes.   POSSIBLE D/C IN 2 to 3 DAYS, DEPENDING ON CLINICAL CONDITION.  Saundra Shelling M.D on 10/24/2018 at 1:42 PM  Between 7am to 6pm - Pager - [phone]  After 6pm go to [email or web address] - password [email or web address] [facility]  Office  [phone]  CC: Primary care physician; Leone Haven, MD  Note: This dictation was prepared with Dragon dictation along with smaller phrase technology. Any transcriptional errors that result from this process are unintentional.

## 2018-10-25 DIAGNOSIS — C864 Blastic NK-cell lymphoma: Secondary | ICD-10-CM

## 2018-10-25 DIAGNOSIS — E43 Unspecified severe protein-calorie malnutrition: Secondary | ICD-10-CM

## 2018-10-25 LAB — BASIC METABOLIC PANEL WITH GFR
Anion gap: 7 (ref 5–15)
BUN: 19 mg/dL (ref 8–23)
CO2: 25 mmol/L (ref 22–32)
Calcium: 7.6 mg/dL — ABNORMAL LOW (ref 8.9–10.3)
Chloride: 102 mmol/L (ref 98–111)
Creatinine, Ser: 1.58 mg/dL — ABNORMAL HIGH (ref 0.61–1.24)
GFR calc Af Amer: 50 mL/min — ABNORMAL LOW
GFR calc non Af Amer: 43 mL/min — ABNORMAL LOW
Glucose, Bld: 208 mg/dL — ABNORMAL HIGH (ref 70–99)
Potassium: 3.9 mmol/L (ref 3.5–5.1)
Sodium: 134 mmol/L — ABNORMAL LOW (ref 135–145)

## 2018-10-25 LAB — CBC
HEMATOCRIT: 26.6 % — AB (ref 39.0–52.0)
HEMOGLOBIN: 8.3 g/dL — AB (ref 13.0–17.0)
MCH: 29 pg (ref 26.0–34.0)
MCHC: 31.2 g/dL (ref 30.0–36.0)
MCV: 93 fL (ref 80.0–100.0)
Platelets: 186 10*3/uL (ref 150–400)
RBC: 2.86 MIL/uL — AB (ref 4.22–5.81)
RDW: 15.9 % — ABNORMAL HIGH (ref 11.5–15.5)
WBC: 22.2 10*3/uL — AB (ref 4.0–10.5)
nRBC: 0.3 % — ABNORMAL HIGH (ref 0.0–0.2)

## 2018-10-25 LAB — GLUCOSE, CAPILLARY
GLUCOSE-CAPILLARY: 136 mg/dL — AB (ref 70–99)
GLUCOSE-CAPILLARY: 110 mg/dL — AB (ref 70–99)
Glucose-Capillary: 211 mg/dL — ABNORMAL HIGH (ref 70–99)
Glucose-Capillary: 176 mg/dL — ABNORMAL HIGH (ref 70–99)

## 2018-10-25 LAB — HEPATITIS PANEL, ACUTE
HCV Ab: <0.1 s/co ratio (ref 0.0–0.9)
HEP B S AG: NEGATIVE
Hep A IgM: NEGATIVE
Hep B C IgM: NEGATIVE

## 2018-10-25 LAB — BASIC METABOLIC PANEL
Anion gap: 10 (ref 5–15)
BUN: 16 mg/dL (ref 8–23)
CHLORIDE: 101 mmol/L (ref 98–111)
CO2: 23 mmol/L (ref 22–32)
CREATININE: 1.44 mg/dL — AB (ref 0.61–1.24)
Calcium: 7.9 mg/dL — ABNORMAL LOW (ref 8.9–10.3)
GFR calc non Af Amer: 48 mL/min — ABNORMAL LOW (ref >60)
GFR, EST AFRICAN AMERICAN: 56 mL/min — AB (ref >60)
Glucose, Bld: 160 mg/dL — ABNORMAL HIGH (ref 70–99)
POTASSIUM: 3.8 mmol/L (ref 3.5–5.1)
SODIUM: 134 mmol/L — AB (ref 135–145)

## 2018-10-25 LAB — BARTONELLA ANTIBODY PANEL
B Quintana IgM: NEGATIVE titer
B henselae IgG: NEGATIVE titer
B quintana IgG: NEGATIVE titer

## 2018-10-25 LAB — MISC LABCORP TEST (SEND OUT)
LABCORP TEST CODE: 164616
Labcorp test code: 16774
Labcorp test code: 96180

## 2018-10-25 LAB — VANCOMYCIN, TROUGH
VANCOMYCIN TR: 29 ug/mL — AB (ref 15–20)
Vancomycin Tr: 26 ug/mL (ref 15–20)

## 2018-10-25 LAB — FIBRINOGEN: Fibrinogen: 626 mg/dL — ABNORMAL HIGH (ref 210–475)

## 2018-10-25 LAB — URIC ACID: Uric Acid, Serum: 6.6 mg/dL (ref 3.7–8.6)

## 2018-10-25 LAB — PROTIME-INR
INR: 1.27
Prothrombin Time: 15.8 seconds — ABNORMAL HIGH (ref 11.4–15.2)

## 2018-10-25 LAB — LACTATE DEHYDROGENASE: LDH: 1039 U/L — ABNORMAL HIGH (ref 98–192)

## 2018-10-25 LAB — PHOSPHORUS: Phosphorus: 3.8 mg/dL (ref 2.5–4.6)

## 2018-10-25 LAB — BARTONELLA ANITBODY PANEL: B HENSELAE IGM: NEGATIVE {titer}

## 2018-10-25 LAB — APTT: aPTT: 37 s — ABNORMAL HIGH (ref 24–36)

## 2018-10-25 LAB — VITAMIN B1: Vitamin B1 (Thiamine): 109.3 nmol/L (ref 66.5–200.0)

## 2018-10-25 MED ORDER — VANCOMYCIN HCL IN DEXTROSE 1-5 GM/200ML-% IV SOLN
1000.0000 mg | Freq: Two times a day (BID) | INTRAVENOUS | Status: DC
  Filled 2018-10-25 (×2): qty 200

## 2018-10-25 MED ORDER — OCUVITE-LUTEIN PO CAPS
1.0000 | ORAL_CAPSULE | Freq: Every day | ORAL | Status: DC
  Administered 2018-10-25: 1 via ORAL
  Filled 2018-10-25 (×2): qty 1

## 2018-10-25 MED ORDER — CYCLOBENZAPRINE HCL 10 MG PO TABS
5.0000 mg | ORAL_TABLET | Freq: Once | ORAL | Status: AC
  Administered 2018-10-25: 5 mg via ORAL
  Filled 2018-10-25: qty 1

## 2018-10-25 MED ORDER — SODIUM CHLORIDE 0.9 % IV SOLN
INTRAVENOUS | Status: DC | PRN
  Administered 2018-10-25 (×2): 500 mL via INTRAVENOUS

## 2018-10-25 MED ORDER — SODIUM CHLORIDE 0.9% FLUSH: 3.0000 mL | Freq: Two times a day (BID) | INTRAVENOUS | Status: DC

## 2018-10-25 NOTE — Progress Notes (Signed)
Discussed with oncologist Patient has a very rare blastic plasmacytoid dendritic neoplasm Tumor lysis labs sent out DIC labs also sent Transfer to [facility] once bed is available

## 2018-10-25 NOTE — Progress Notes (Signed)
[facility] at [place] NAME: Richard Gordon    MR#:  [id number]  DATE OF BIRTH:  07-21-1951  SUBJECTIVE:   Pt. Remains confused.  Afebrile, but cultures are negative, status post bone marrow biopsy done 2 days ago results of which is still pending.  No other acute events overnight.  REVIEW OF SYSTEMS:    Review of Systems  Unable to perform ROS: Mental acuity    Nutrition: Regular Tolerating Diet: Yes Tolerating PT: Await Eval.   DRUG ALLERGIES:  No Known Allergies  VITALS:  Blood pressure 136/64, pulse (!) 108, temperature 98.6 F (37 C), temperature source Oral, resp. rate 17, height 5' 10" (1.778 m), weight 66.3 kg, SpO2 91 %.  PHYSICAL EXAMINATION:   Physical Exam  GENERAL:  67 y.o.-year-old patient lying in bed in no acute distress.  EYES: Pupils equal, round, reactive to light and accommodation. No scleral icterus. Extraocular muscles intact.  HEENT: Head atraumatic, normocephalic. Oropharynx and nasopharynx clear.  NECK:  Supple, no jugular venous distention. No thyroid enlargement, no tenderness.  LUNGS: Normal breath sounds bilaterally, no wheezing, rales, rhonchi. No use of accessory muscles of respiration.  CARDIOVASCULAR: S1, S2 normal. No murmurs, rubs, or gallops.  ABDOMEN: Soft, nontender, nondistended. Bowel sounds present. No organomegaly or mass.  EXTREMITIES: No cyanosis, clubbing or edema b/l.    NEUROLOGIC: Cranial nerves II through XII are intact. No focal Motor or sensory deficits b/l.  Globally weak PSYCHIATRIC: The patient is alert and oriented x 3.  SKIN: No obvious rash, lesion, or ulcer.    LABORATORY PANEL:   CBC Recent Labs  Lab 10/25/18 0935  WBC 22.2*  HGB 8.3*  HCT 26.6*  PLT 186   ------------------------------------------------------------------------------------------------------------------  Chemistries  Recent Labs  Lab 10/20/18 0538  10/25/18 0935  NA 136   < > 134*  K 3.2*    < > 3.8  CL 101   < > 101  CO2 27   < > 23  GLUCOSE 79   < > 160*  BUN 15   < > 16  CREATININE 0.96   < > 1.44*  CALCIUM 8.0*   < > 7.9*  AST 16  --   --   ALT 14  --   --   ALKPHOS 216*  --   --   BILITOT 0.8  --   --    < > = values in this interval not displayed.   ------------------------------------------------------------------------------------------------------------------  Cardiac Enzymes No results for input(s): TROPONINI in the last 168 hours. ------------------------------------------------------------------------------------------------------------------  RADIOLOGY:  No results found.   ASSESSMENT AND PLAN:   67 year old male with past medical history of essential hypertension, hyperlipidemia, diabetes, chronic kidney disease stage III, history of previous CVA who presented to the hospital due to leg pain, fatigue, weakness and noted to have a leukocytosis.  1.  Sepsis-suspected to be secondary to endocarditis.  Patient was admitted recently and discharged on October 14, 2018 without having a full work-up for endocarditis.  He had a TTE which was negative but a TEE was not done. - Patient presumably has culture-negative endocarditis is currently be treated for it as per infectious disease. -Patient's TEE during this hospitalization shows a small vegetation on the aortic valve.   -continue IV vancomycin, ceftriaxone as per infectious disease.  2.  Culture negative endocarditis-this is a presumed source of patient's sepsis.  Patient continues to have a persistent leukocytosis but no fever.  His  cultures remain negative. -Seen by infectious disease and continue IV ceftriaxone, vancomycin for now.  3.  Leg pain, weakness- Because of complains of weakness in his legs and inability to walk he got MRI of the Thoraco/lumbar spine . It shows no infection but Diffusely abnormal bone marrow signal within the thoracolumbarand included sacral spine with heterogeneous enhancement  highly concerning for infiltrative tumor (infiltrative metastatic disease versus myeloproliferative disorder or leukemia/lymphoma). No pathologic fracture. Had bone marrow biopsy- result pending  4.  Altered mental status/encephalopathy- seen by neurology and they think this is likely metabolic related to underlying illness and unlikely a acute neurologic event. -Patient has some cerebral atrophy on the MRI and also old CVAs but nothing acute.  Continue supportive care for now.  No plans for further intervention as per neurology.  5.  Anemia of chronic disease-hemoglobin stable, no acute need for transfusion.  6.  Diabetes type 2 without complication-continue Lantus, sliding scale insulin.  7.  Essential hypertension-continue metoprolol, lisinopril. - cont. PRN hydralazine.    All the records are reviewed and case discussed with Care Management/Social Worker. Management plans discussed with the patient, family and they are in agreement.  CODE STATUS: Full code  DVT Prophylaxis: Lovenox  TOTAL TIME TAKING CARE OF THIS PATIENT: 30 minutes.   POSSIBLE D/C IN 2-3 DAYS, DEPENDING ON CLINICAL CONDITION.   Henreitta Leber M.D on 10/25/2018 at 3:12 PM  Between 7am to 6pm - Pager - [phone]  After 6pm go to [email or web address] - [employer] [facility]  Office  [phone]  CC: Primary care physician; Leone Haven, MD

## 2018-10-25 NOTE — Progress Notes (Signed)
Pharmacy Antibiotic Note Richard Gordon is a 67 y.o. male admitted on 10/16/2018 with sepsis.  Pharmacy has been consulted for vancomycin dosing.  Plan: Vanc trough resulted at 26. However, level was drawn a few min AFTER dose was hung. True trough likely lower. I will check another level before next dose.  11/21:  VT @ 21:30 = 29.  Called RN and confirmed VT was draw before 2200 Vanc dose had been hung.  RN says pt is going to be transferred to [facility].   Will Gordon/c Vancomycin.   Height: 5' 10" (177.8 cm) Weight: 146 lb 3.2 oz (66.3 kg) IBW/kg (Calculated) : 73  Temp (24hrs), Avg:98.9 F (37.2 C), Min:98.6 F (37 C), Max:99.2 F (37.3 C)  Recent Labs  Lab 10/20/18 0538 10/21/18 0459 10/22/18 0721 10/23/18 0542  10/24/18 0801 10/25/18 0935 10/25/18 2106  WBC 14.9* 18.6* 20.3* 18.9*  --  20.7* 22.2*  --   CREATININE 0.96 1.06  --  1.14  --   --  1.44* 1.58*  VANCOTROUGH  --   --   --   --    < > 22* 26* 29*   < > = values in this interval not displayed.    Estimated Creatinine Clearance: 42 mL/min (A) (by C-G formula based on SCr of 1.58 mg/dL (H)).    No Known Allergies  Antimicrobials this admission:   >>    >>   Dose adjustments this admission:   Microbiology results:  BCx:   UCx:    Sputum:    MRSA PCR:   Thank you for allowing pharmacy to be a part of this patient's care.  Richard Gordon 10/25/2018 9:39 PM

## 2018-10-25 NOTE — Progress Notes (Signed)
Initial Nutrition Assessment  DOCUMENTATION CODES:   Severe malnutrition in context of acute illness/injury, Underweight  INTERVENTION:  -Ensure Enlive po TID, each supplement provides 350 kcal and 20 grams of protein (pt likes chocolate) -Adding snacks in between meals to increase PO intake (pt requested blueberry yogurt) -MVI   NUTRITION DIAGNOSIS:   Severe Malnutrition related to acute illness(endocarditis) as evidenced by moderate fat depletion, moderate muscle depletion, energy intake < 75% for > or equal to 1 month, percent weight loss.   GOAL:   Patient will meet greater than or equal to 90% of their needs   MONITOR:   PO intake, Weight trends, Supplement acceptance, I & O's, Labs  REASON FOR ASSESSMENT:   LOS    ASSESSMENT:  67 year old patient with known history of CKD3, T2DM, HLD, Stroke presented to ED complaining of stiff legs, generalized weakness 2days after pt requested discharge for sepsis. Pt admitted 11/12 for endocarditis.   Previous admission 11/6 RD spoke with pt who reported walking at [facility] 1-2 miles a few times per week and dietary recall reported 1-2 meals/day; mostly fast food ([employer], [place]) and ensure for dinner.   Pt reports slight improvements to decreased mobility and pain in legs since admission. Pt stated he has not had much of an appetite; RN reports a friend is bringing in outside food that pt is eating instead of meals from the hospital. Pt stated he does enjoy the chocolate Glucerna and requests blueberry yogurt.   Medications: colace, novolog (0-15) TID, novolog (0-5) at bedtime lantus (20 units) at bedtime, flagyl, vancocin  Labs: Glucose 211 (H), Na 134 (L), Cr 1.44 (H)  Lab Results  Component Value Date   HGBA1C 6.3 (H) 10/16/2018    NUTRITION - FOCUSED PHYSICAL EXAM:    Most Recent Value  Orbital Region  Moderate depletion  Upper Arm Region  Mild depletion  Thoracic and Lumbar Region  Moderate depletion   Buccal Region  Moderate depletion  Temple Region  Moderate depletion  Clavicle Bone Region  Mild depletion  Clavicle and Acromion Bone Region  Mild depletion  Scapular Bone Region  Mild depletion  Dorsal Hand  Mild depletion  Patellar Region  Mild depletion  Anterior Thigh Region  Mild depletion  Posterior Calf Region  Mild depletion  Edema (RD Assessment)  None  Hair  Reviewed  Eyes  Reviewed  Mouth  Reviewed  Skin  Reviewed  Nails  Reviewed       Diet Order:   Diet Order            Diet regular Room service appropriate? Yes; Fluid consistency: Thin  Diet effective now             EDUCATION NEEDS:   No education needs have been identified at this time  Skin:  Skin Assessment: Reviewed RN Assessment(Bilateral arm; petechiae)  Last BM:  10/24/2018; WDL  Height:   Ht Readings from Last 1 Encounters:  10/16/18 5' 10" (1.778 m)    Weight:   Wt Readings from Last 1 Encounters:  10/21/18 66.3 kg  10/09/18  79.4 kg  Ideal Body Weight:  75.5 kg  BMI:  Body mass index is 20.98 kg/m.  Estimated Nutritional Needs:   Kcal:  2956-2130 (MSJ 1.2-1.3)  Protein:  80-100 grams (1.2-1.5g/kg)  Fluid:  1.7-1.8L   Lajuan Lines, RD, LDN  After Hours/Weekend Pager: [phone]

## 2018-10-25 NOTE — Discharge Summary (Addendum)
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 Chronic kidney disease (CKD), stage III (moderate) (Richard Gordon) 11/03/2015  . Diabetes mellitus without complication (Richard Gordon)   . Hyperlipidemia   . Hypertension   . Stroke [facility])    per pt 2 CVA in 2000,and 1998 (  R ,then L side effected seperately per pt )     ADMITTING HISTORY Richard Gordon  is a 67 y.o. male with a known history per below recently discharged from the hospital 2 days ago per his request for sepsis despite concern for endocarditis-patient did not complete work-up at that time, now returns with vague complaints of stiff legs difficulty with walking, generalized weakness, fatigue, insomnia, in the emergency room patient was found to have potassium 3.3, white count of 31,000, patient denies any current pain, patient now be admitted for acute probable endocarditis  HOSPITAL COURSE:  Patient was admitted to medical floor for suspected bacterial endocarditis and sepsis.  Potassium was replaced aggressively.  Patient was started on IV cefepime vancomycin and Flagyl antibiotics.  Transthoracic echocardiogram was done and cardiology consultation was done.  Patient was evaluated by infectious disease doctor during the hospitalization.  Patient continued IV antibiotics.  Cultures did not reveal any growth.   Transesophageal echocardiogram was done by cardiology which showed aortic wall vegetation.  Antibiotics were narrowed down to IV Rocephin and vancomycin and Flagyl was discontinued.  Patient's leukocytosis persisted and again infectious disease specialist added vancomycin and Flagyl antibiotics along with IV Rocephin.  Patient was worked up for lumbosacral spine infiltrating lesion with MRI lumbar thoracic spine.  During the hospitalization patient was worked up with CT head and CT chest.  Patient had a bone marrow biopsy done with the help of interventional radiology and with the help of oncology.  Bone marrow biopsy results came as a rare plasmacytoid blastic dendritic neoplasm.  Case was discussed with [facility] oncology center by our oncologist and patient has been accepted for further oncology management and treatment for the renal neoplasm.. Patient currently on IV Rocephin antibiotic 2 g every 24 hourly, IV vancomycin 1 g every 12 hourly, IV Flagyl 500 Mg every 8 hourly.  Patient on sliding scale coverage with insulin.  Transesophageal echocardiogram Left ventricle: Systolic function was normal. The estimated   ejection fraction was in the range of 50% to 55%. - Aortic valve: There was a medium-sized, 0.6 cm (L) x 0.9 cm (W)   vegetation on the left ventricular aspect of the left coronary   cusp; the appearance is consistent with vegetation. - Mitral valve: No evidence of vegetation. There was mild   regurgitation. - Left atrium: No evidence of thrombus in the atrial cavity or   appendage. - Atrial septum: No defect or patent foramen ovale was identified.   Echo contrast study showed no right-to-left atrial level shunt,   at baseline or with provocation.  Impressions:  - Probable aortic valve vegetation with no significant  ai. CONSULTS OBTAINED:  Treatment Team:  Tsosie Billing, MD Yolonda Kida, MD Teodoro Spray, MD Lequita Asal, MD Alexis Goodell, MD  DRUG  ALLERGIES:  No Known Allergies  DISCHARGE MEDICATIONS:   Allergies as of 10/25/2018   No Known Allergies     Medication List    STOP taking these medications   accu-chek soft touch lancets   aspirin EC 81 MG tablet   BASAGLAR KWIKPEN 100 UNIT/ML Sopn   blood glucose meter kit and supplies Kit   cefdinir 300 MG capsule Commonly known as:  OMNICEF   diltiazem 30 MG tablet Commonly known as:  CARDIZEM   empagliflozin 10 MG Tabs tablet Commonly known as:  JARDIANCE   feeding supplement (GLUCERNA SHAKE) Liqd   glucose blood test strip   hydrochlorothiazide 12.5 MG tablet Commonly known as:  HYDRODIURIL   Insulin Pen Needle 31G X 5 MM Misc   lisinopril 20 MG tablet Commonly known as:  PRINIVIL,ZESTRIL   metFORMIN 1000 MG tablet Commonly known as:  GLUCOPHAGE   metroNIDAZOLE 500 MG tablet Commonly known as:  FLAGYL   simvastatin 5 MG tablet Commonly known as:  ZOCOR       Today  Patient seen today Awake and responds to all verbal commands Tolerating diet okay  VITAL SIGNS:  Blood pressure 136/64, pulse (!) 108, temperature 98.6 F (37 C), temperature source Oral, resp. rate 17, height 5' 10" (1.778 m), weight 66.3 kg, SpO2 91 %.  I/O:    Intake/Output Summary (Last 24 hours) at 10/25/2018 2109 Last data filed at 10/25/2018 2009 Gross per 24 hour  Intake 671.17 ml  Output 250 ml  Net 421.17 ml    PHYSICAL EXAMINATION:  Physical Exam  GENERAL:  67 y.o.-year-old patient lying in the bed with no acute distress.  LUNGS: Normal breath sounds bilaterally, no wheezing, rales,rhonchi or crepitation. No use of accessory muscles of respiration.  CARDIOVASCULAR: S1, S2 normal. No murmurs, rubs, or gallops.  ABDOMEN: Soft, non-tender, non-distended. Bowel sounds present. No organomegaly or mass.  NEUROLOGIC: Moves all 4 extremities. PSYCHIATRIC: The patient is alert and oriented x 2  SKIN: No obvious rash, lesion, or ulcer.   DATA REVIEW:    CBC Recent Labs  Lab 10/25/18 0935  WBC 22.2*  HGB 8.3*  HCT 26.6*  PLT 186    Chemistries  Recent Labs  Lab 10/20/18 0538  10/25/18 0935  NA 136   < > 134*  K 3.2*   < > 3.8  CL 101   < > 101  CO2 27   < > 23  GLUCOSE 79   < > 160*  BUN 15   < > 16  CREATININE 0.96   < > 1.44*  CALCIUM 8.0*   < > 7.9*  AST 16  --   --   ALT 14  --   --   ALKPHOS 216*  --   --   BILITOT 0.8  --   --    < > = values in this interval not displayed.    Cardiac Enzymes No results for input(s): TROPONINI in the last 168 hours.  Microbiology Results  Results for orders placed or performed during the hospital encounter of 10/16/18  Culture, blood (routine x 2)     Status: None   Collection Time: 10/16/18  3:23 PM  Result Value Ref Range Status   Specimen Description BLOOD RIGHT ANTECUBITAL  Final   Special Requests   Final  BOTTLES DRAWN AEROBIC AND ANAEROBIC Blood Culture adequate volume   Culture   Final    NO GROWTH 5 DAYS Performed at [facility], [address] [place], [place] [address]    Report Status 10/21/2018 FINAL  Final  Culture, blood (routine x 2)     Status: None   Collection Time: 10/16/18  3:24 PM  Result Value Ref Range Status   Specimen Description BLOOD BLOOD LEFT HAND  Final   Special Requests   Final    BOTTLES DRAWN AEROBIC AND ANAEROBIC Blood Culture results may not be optimal due to an excessive volume of blood received in culture bottles   Culture   Final    NO GROWTH 5 DAYS Performed at [facility], [address], [place],  [address]    Report Status 10/21/2018 FINAL  Final    RADIOLOGY:  No results found.  Follow up with PCP in 1 week.  Management plans discussed with the patient, family and they are in agreement.  CODE STATUS: Full code    Code Status Orders  (From admission, onward)         Start     Ordered   10/16/18 1713  Full code  Continuous     10/16/18 1712        Code Status History     Date Active Date Inactive Code Status Order ID Comments User Context   10/09/2018 2301 10/14/2018 1535 Full Code [id number]  Demetrios Loll, MD Inpatient      TOTAL TIME TAKING CARE OF THIS PATIENT ON DAY OF DISCHARGE: more than 38 minutes.   Saundra Shelling M.D on 10/25/2018 at 9:09 PM  Between 7am to 6pm - Pager - [phone]  After 6pm go to [email or web address] - password [email or web address] [facility]  Office  [phone]  CC: Primary care physician; Leone Haven, MD  Note: This dictation was prepared with Dragon dictation along with smaller phrase technology. Any transcriptional errors that result from this process are unintentional.

## 2018-10-25 NOTE — Progress Notes (Signed)
Physical Therapy Treatment Patient Details Name: Richard Gordon MRN: [id number] DOB: 15-Oct-1951 Today's Date: 10/25/2018    History of Present Illness Pt is a 67 y/o M who presented with stiff legs, difficulty walking, weakness, fatigue.  Pt admitted for endocarditis.  Recent imaging suggestive of possible neurodegenerative disorder vs hypermetabolic state/metastatic disease.  Pts PMH includes stroke x2, syncope.     PT Comments    Pt agrees to session.  Cane in room from home but requests to use walker as he feels more comfortable.  Increased effort to get to edge of bed but able to do so without assist.  Stood with min assist and was able to walk to/from door with walker and short shuffling steps.  Limited by fatigue.  He declined further interventions at this time.   HR stable during session.   Follow Up Recommendations  CIR     Equipment Recommendations  Rolling walker with 5" wheels    Recommendations for Other Services       Precautions / Restrictions Precautions Precautions: Fall Precaution Comments: monitor HR Restrictions Weight Bearing Restrictions: No    Mobility  Bed Mobility   Bed Mobility: Sit to Supine;Supine to Sit     Supine to sit: Supervision Sit to supine: Supervision   General bed mobility comments: pulled on sheets for assist and increased effort.  Transfers Overall transfer level: Needs assistance Equipment used: Rolling walker (2 wheeled) Transfers: Sit to/from Stand Sit to Stand: Min assist            Ambulation/Gait Ambulation/Gait assistance: Min Web designer (Feet): 12 Feet Assistive device: Rolling walker (2 wheeled) Gait Pattern/deviations: Shuffle;Step-through pattern;Decreased step length - right;Decreased step length - left Gait velocity: decreased   General Gait Details: broad BOS, shaky, staggering steps. difficulty with LLE management/motor control.   Stairs             Wheelchair Mobility    Modified  Rankin (Stroke Patients Only)       Balance Overall balance assessment: Needs assistance Sitting-balance support: No upper extremity supported;Feet supported Sitting balance-Leahy Scale: Fair     Standing balance support: Bilateral upper extremity supported Standing balance-Leahy Scale: Poor Standing balance comment: Pt relies on at least 1UE support for static and dynamic activities.                             Cognition Arousal/Alertness: Awake/alert Behavior During Therapy: WFL for tasks assessed/performed Overall Cognitive Status: Within Functional Limits for tasks assessed                                        Exercises      General Comments        Pertinent Vitals/Pain Pain Assessment: No/denies pain    Home Living                      Prior Function            PT Goals (current goals can now be found in the care plan section) Progress towards PT goals: Progressing toward goals    Frequency    Min 2X/week      PT Plan Current plan remains appropriate    Co-evaluation              AM-PAC PT "6 Clicks" Daily Activity  Outcome Measure  Difficulty turning over in bed (including adjusting bedclothes, sheets and blankets)?: A Little Difficulty moving from lying on back to sitting on the side of the bed? : A Little Difficulty sitting down on and standing up from a chair with arms (e.g., wheelchair, bedside commode, etc,.)?: Unable Help needed moving to and from a bed to chair (including a wheelchair)?: A Little Help needed walking in hospital room?: A Little Help needed climbing 3-5 steps with a railing? : A Lot 6 Click Score: 15    End of Session Equipment Utilized During Treatment: Gait belt Activity Tolerance: Patient tolerated treatment well Patient left: in bed;with call bell/phone within reach;with bed alarm set         Time: 2841-3244 PT Time Calculation (min) (ACUTE ONLY): 10 min  Charges:   $Gait Training: 8-22 mins                     Chesley Noon, PTA 10/25/18, 1:07 PM

## 2018-10-25 NOTE — Care Management Important Message (Signed)
Copy of signed IM left with patient in room.

## 2018-10-25 NOTE — Progress Notes (Signed)
Report called to Santiago Glad, RN at [facility] who will be taking care of the patient and all questions answered. Also spoke with transport at [facility] and [employer] will be picking up the patient around 1:30 AM. EMTALA form and discharge paperwork prepared. Attempted to call patient's brother to update on plan of care, but phone # listed in the chart is not in service. Patient updated on plan of care and no questions or concerns at this time. Nursing staff will continue to monitor for any changes in patient status. Earleen Reaper, RN

## 2018-10-25 NOTE — Consult Note (Signed)
Pharmacy Antibiotic Note  Richard Gordon is a 67 y.o. male admitted on 10/16/2018 with sepsis.  Pharmacy has been consulted for vancomycin dosing.  Plan: Vanc trough resulted at 26. However, level was drawn a few min AFTER dose was hung. True trough likely lower. I will check another level before next dose.   Height: 5' 10" (177.8 cm) Weight: 146 lb 3.2 oz (66.3 kg) IBW/kg (Calculated) : 73  Temp (24hrs), Avg:98.9 F (37.2 C), Min:98.6 F (37 C), Max:99.2 F (37.3 C)  Recent Labs  Lab 10/19/18 0529 10/20/18 0538 10/21/18 0459 10/22/18 0721 10/23/18 0542 10/24/18 0801 10/25/18 0935  WBC 14.3* 14.9* 18.6* 20.3* 18.9* 20.7* 22.2*  CREATININE 0.93 0.96 1.06  --  1.14  --  1.44*  VANCOTROUGH  --   --   --   --   --  22* 26*    Estimated Creatinine Clearance: 46 mL/min (A) (by C-G formula based on SCr of 1.44 mg/dL (H)).    No Known Allergies  Antimicrobials this admission: vanc 11/12>>11/15, 11/18>> Cefepime 11/12>>11/14 Flagyl 11/12>> Ceftriaxone 11/14>>  Dose adjustments this admission:   Microbiology results: 11/12 BCx: NG 11/05 UCx: NG    Thank you for allowing pharmacy to be a part of this patient's care.  Ramond Dial, Pharm.D, BCPS Clinical Pharmacist 10/25/2018 10:49 AM

## 2018-10-25 NOTE — Progress Notes (Signed)
[facility] Hematology/Oncology Progress Note  Date of admission: 10/16/2018  Hospital day:  10/25/2018  Chief Complaint: Richard Gordon is a 67 y.o. male who was admitted through the emergency room with suspected bacterial endocarditis and sepsis.  Subjective:  Patient denies any new complaints.  He notes generalized weakness and plan for rehab.  Social History: The patient is alone today.  Allergies: No Known Allergies  Scheduled Medications: . aspirin EC  81 mg Oral Daily  . diltiazem  30 mg Oral Q8H  . docusate sodium  100 mg Oral BID  . enoxaparin (LOVENOX) injection  40 mg Subcutaneous Q24H  . feeding supplement (GLUCERNA SHAKE)  237 mL Oral TID BM  . insulin aspart  0-15 Units Subcutaneous TID WC  . insulin aspart  0-5 Units Subcutaneous QHS  . insulin glargine  20 Units Subcutaneous QHS  . lisinopril  20 mg Oral Daily  . metoprolol tartrate  25 mg Oral BID  . multivitamin-lutein  1 capsule Oral Daily  . sodium chloride flush  3 mL Intravenous Q12H    Review of Systems: GENERAL:  General fatigue.  Some sweats.  Low grade temperature (Tmax 24 hours- 99.2).  Weight loss of 15 pounds prior to admission. PERFORMANCE STATUS (ECOG):  2 HEENT:  No visual changes, runny nose, sore throat, mouth sores or tenderness. Lungs: No shortness of breath or cough.  No hemoptysis. Cardiac:  No chest pain, palpitations, orthopnea, or PND. GI:  No nausea, vomiting, diarrhea, constipation, melena or hematochezia. GU:  No urgency, frequency, dysuria, or hematuria. Musculoskeletal:  Legs ache.  Intermittent back pain. No muscle tenderness. Extremities:  Muscle atrophy.  No pain or swelling. Skin:  No rashes or skin changes. Neuro:  Chronic left arm numbness s/p CVA.  No headache, numbness or focal weakness, balance or coordination issues. Endocrine:  No diabetes, thyroid issues, hot flashes or night sweats. Psych:  No mood changes, depression or anxiety. Pain:  No focal  pain. Review of systems:  All other systems reviewed and found to be negative.   Physical Exam: Blood pressure 136/64, pulse (!) 108, temperature 98.6 F (37 C), temperature source Oral, resp. rate 17, height 5' 10" (1.778 m), weight 146 lb 3.2 oz (66.3 kg), SpO2 91 %.  GENERAL:  Chronically fatigued appearing gentleman lying comfortably on the medical unit watching TV in no acute distress. MENTAL STATUS:  Alert and oriented to person, place and time. HEAD:  Thin red hair with graying.  Normocephalic, atraumatic, face symmetric, no Cushingoid features. EYES:  Glasses.  Blue eyes.  Pupils equal round and reactive to light and accomodation.  No conjunctivitis or scleral icterus. ENT:  Oropharynx clear without lesion.  Tongue normal. Mucous membranes moist.  RESPIRATORY:  Clear to auscultation without rales, wheezes or rhonchi. CARDIOVASCULAR:  Regular rate and rhythm without murmur, rub or gallop. ABDOMEN:  Soft, non-tender, with active bowel sounds, and no hepatosplenomegaly.  No masses. SKIN:  Scattered bruises s/p IVs and blood draws.  No rashes, ulcers or lesions. EXTREMITIES: Lower extremity muscle wasting.  No edema, no skin discoloration or tenderness.  No palpable cords. LYMPH NODES: No palpable cervical, supraclavicular, axillary or inguinal adenopathy  NEUROLOGICAL: Unremarkable. PSYCH:  Appropriate.    Results for orders placed or performed during the hospital encounter of 10/16/18 (from the past 48 hour(s))  Glucose, capillary     Status: Abnormal   Collection Time: 10/23/18  9:05 PM  Result Value Ref Range   Glucose-Capillary 133 (H) 70 -  99 mg/dL  Glucose, capillary     Status: Abnormal   Collection Time: 10/23/18 10:08 PM  Result Value Ref Range   Glucose-Capillary 185 (H) 70 - 99 mg/dL  CBC     Status: Abnormal   Collection Time: 10/24/18  8:01 AM  Result Value Ref Range   WBC 20.7 (H) 4.0 - 10.5 K/uL   RBC 2.83 (L) 4.22 - 5.81 MIL/uL   Hemoglobin 8.5 (L) 13.0 - 17.0  g/dL   HCT 26.5 (L) 39.0 - 52.0 %   MCV 93.6 80.0 - 100.0 fL   MCH 30.0 26.0 - 34.0 pg   MCHC 32.1 30.0 - 36.0 g/dL   RDW 16.0 (H) 11.5 - 15.5 %   Platelets 194 150 - 400 K/uL   nRBC 0.2 0.0 - 0.2 %    Comment: Performed at [facility], [address] [place], [place] [address]  Vancomycin, trough     Status: Abnormal   Collection Time: 10/24/18  8:01 AM  Result Value Ref Range   Vancomycin Tr 22 (HH) 15 - 20 ug/mL    Comment: CRITICAL RESULT CALLED TO, READ BACK BY AND VERIFIED WITH CHANDLER SHANDLEVER_0  ON 10/24/18 BY HKP Performed at [facility], [address] [place], [place] [address]   Glucose, capillary     Status: Abnormal   Collection Time: 10/24/18  8:24 AM  Result Value Ref Range   Glucose-Capillary 104 (H) 70 - 99 mg/dL  Bartonella anitbody panel     Status: None   Collection Time: 10/24/18 11:19 AM  Result Value Ref Range   B henselae IgG Negative Neg:<1:320 titer   B henselae IgM Negative Neg:<1:100 titer   B quintana IgG Negative Neg:<1:320 titer   B Quintana IgM Negative Neg:<1:100 titer    Comment: (NOTE) Note: Bartonella henselae is now regarded as the etiologic agent of [place] Disease, bacillary angiomatosis, endocarditis and fever with bacteremia.  Bartonella quintana also causes bacillary angiomatosis particularly among immunocompromised patients, and trench fever. This test was developed and its performance characteristics determined by [facility].  It has not been cleared or approved by the Food and Drug Administration.  The FDA has determined that such clearance or approval is not necessary. Performed At: [facility] [place], [place] [id number] Rush Farmer MD WG:[id number]   Miscellaneous [facility] test (send-out)     Status: None   Collection Time: 10/24/18 11:19 AM  Result Value Ref Range   [facility] test code [id number]    [facility] test name Q FEVER     Comment: Performed at [facility], [address] [place], [place] [address]   [place] result COMMENT     Comment: (NOTE) Test Ordered: [id number] Q Fever Antibodies, IgG Q Fever Phase I                Negative                  BN     Reference Range: Neg:<1:16                             Q Fever Phase II               Negative                  BN     Reference Range: Neg:<1:16  Note: Four-fold increases between paired sera are recommended to demonstrate recent infection. Performed At: [facility] [place], [place] [id number] Rush Farmer MD WU:[id number]   Miscellaneous [facility] test (send-out)     Status: None   Collection Time: 10/24/18 11:19 AM  Result Value Ref Range   [facility] test code [id number]    [facility] test name CHLAMYDIA ABS     Comment: Performed at [facility], [address] [place],  [address]   Misc [facility] result COMMENT     Comment: (NOTE) Test Ordered: 260 165 [id number] Chlamydia Antibodies, IgG Chlamydia Antibodies, IgG      <0.91            ratio    BN     Reference Range: 0.00-0.90                                                            Negative         <0.91                                Equivocal  0.91 - 1.09                                Positive         >1.09 Performed At: [facility] [place], [place] [id number] Rush Farmer MD [id number]   Procalcitonin - Baseline     Status: None   Collection Time: 10/24/18 11:19 AM  Result Value Ref Range   Procalcitonin 0.37 ng/mL    Comment:        Interpretation: PCT (Procalcitonin) <= 0.5 ng/mL: Systemic infection (sepsis) is not likely. Local bacterial infection is possible. (NOTE)       Sepsis PCT Algorithm           Lower Respiratory Tract                                      Infection PCT Algorithm    ----------------------------     ----------------------------         PCT < 0.25 ng/mL                PCT < 0.10 ng/mL         Strongly  encourage             Strongly discourage   discontinuation of antibiotics    initiation of antibiotics    ----------------------------     -----------------------------       PCT 0.25 - 0.50 ng/mL            PCT 0.10 - 0.25 ng/mL               OR       >80% decrease in PCT            Discourage initiation of  antibiotics      Encourage discontinuation           of antibiotics    ----------------------------     -----------------------------         PCT >= 0.50 ng/mL              PCT 0.26 - 0.50 ng/mL               AND        <80% decrease in PCT             Encourage initiation of                                             antibiotics       Encourage continuation           of antibiotics    ----------------------------     -----------------------------        PCT >= 0.50 ng/mL                  PCT > 0.50 ng/mL               AND         increase in PCT                  Strongly encourage                                      initiation of antibiotics    Strongly encourage escalation           of antibiotics                                     -----------------------------                                           PCT <= 0.25 ng/mL                                                 OR                                        > 80% decrease in PCT                                     Discontinue / Do not initiate                                             antibiotics Performed at [facility], [address] [place], [place] [address]   Hepatitis panel, acute     Status: None   Collection Time: 10/24/18 11:19  AM  Result Value Ref Range   Hepatitis B Surface Ag Negative Negative   HCV Ab <0.1 0.0 - 0.9 s/co ratio    Comment: (NOTE)                                  Negative:     < 0.8                             Indeterminate: 0.8 - 0.9                                  Positive:     > 0.9 The CDC recommends that a positive HCV  antibody result be followed up with a HCV Nucleic Acid Amplification test ([id number]). Performed At: [facility] [place], [place] [id number] Rush Farmer MD IR:[id number]    Hep A IgM Negative Negative   Hep B C IgM Negative Negative  Glucose, capillary     Status: Abnormal   Collection Time: 10/24/18 11:47 AM  Result Value Ref Range   Glucose-Capillary 134 (H) 70 - 99 mg/dL  Glucose, capillary     Status: Abnormal   Collection Time: 10/24/18  6:08 PM  Result Value Ref Range   Glucose-Capillary 120 (H) 70 - 99 mg/dL  Glucose, capillary     Status: Abnormal   Collection Time: 10/25/18  8:40 AM  Result Value Ref Range   Glucose-Capillary 136 (H) 70 - 99 mg/dL   Comment 1 Notify RN    Comment 2 Document in Chart   CBC     Status: Abnormal   Collection Time: 10/25/18  9:35 AM  Result Value Ref Range   WBC 22.2 (H) 4.0 - 10.5 K/uL   RBC 2.86 (L) 4.22 - 5.81 MIL/uL   Hemoglobin 8.3 (L) 13.0 - 17.0 g/dL   HCT 26.6 (L) 39.0 - 52.0 %   MCV 93.0 80.0 - 100.0 fL   MCH 29.0 26.0 - 34.0 pg   MCHC 31.2 30.0 - 36.0 g/dL   RDW 15.9 (H) 11.5 - 15.5 %   Platelets 186 150 - 400 K/uL   nRBC 0.3 (H) 0.0 - 0.2 %    Comment: Performed at [facility], [address] [place], [place] [address]  Basic metabolic panel     Status: Abnormal   Collection Time: 10/25/18  9:35 AM  Result Value Ref Range   Sodium 134 (L) 135 - 145 mmol/L   Potassium 3.8 3.5 - 5.1 mmol/L   Chloride 101 98 - 111 mmol/L   CO2 23 22 - 32 mmol/L   Glucose, Bld 160 (H) 70 - 99 mg/dL   BUN 16 8 - 23 mg/dL   Creatinine, Ser 1.44 (H) 0.61 - 1.24 mg/dL   Calcium 7.9 (L) 8.9 - 10.3 mg/dL   GFR calc non Af Amer 48 (L) >60 mL/min   GFR calc Af Amer 56 (L) >60 mL/min    Comment: (NOTE) The eGFR has been calculated using the CKD EPI equation. This calculation has not been validated in all clinical situations. eGFR's persistently <60 mL/min signify possible Chronic Kidney Disease.    Anion  gap 10 5 - 15    Comment: Performed at [facility], [place], [place] [address]  Vancomycin, trough  Status: Abnormal   Collection Time: 10/25/18  9:35 AM  Result Value Ref Range   Vancomycin Tr 26 (HH) 15 - 20 ug/mL    Comment: CRITICAL RESULT CALLED TO, READ BACK BY AND VERIFIED WITH  CHRISTINE KATSOUDAS AT 1025 10/25/18 SDR Performed at [facility], [address] [place], [place] [address]   Glucose, capillary     Status: Abnormal   Collection Time: 10/25/18 11:56 AM  Result Value Ref Range   Glucose-Capillary 211 (H) 70 - 99 mg/dL   Comment 1 Notify RN    Comment 2 Document in Chart   Glucose, capillary     Status: Abnormal   Collection Time: 10/25/18  5:30 PM  Result Value Ref Range   Glucose-Capillary 110 (H) 70 - 99 mg/dL   Comment 1 Notify RN    Comment 2 Document in Chart    No results found.  Assessment:  Richard Gordon is a 67 y.o. male with history of fever and leukocytosis with left shift concerning for endocarditis.  TEE on 10/18/2018 revealed a probable vegetation on the aortic valve.  Cultures are negative.  He is on broad spectrum antibiotics.  He has a 1 year history of declining stamina, fatigue, and muscle atrophy in his lower extremities.  Recently this has affected his ability to walk.  CK is normal.  Alkaline phosphatase is elevated.  Abdomen and pelvic CT on 10/10/2018 revealed soft tissue stranding in the retroperitoneal space of the abdomen, diffusely around the pancreas, adrenal glands, and kidneys. These changes were fairly evenly distributed without a definite epicenter making the appearance indeterminate. Changes around the pancreas could be related to pancreatitis and the perirenal edema could reflect pyelonephritis. Bilateral symmetric periadrenal edema/stranding was of uncertain significance.  There was no evidence for discrete, focal abscess in the abdomen/pelvis.   Thoracic and lumbar spine MRI on  10/18/2018 revealed diffusely abnormal bone marrow signal within the thoracolumbar and included sacral spine with heterogeneous enhancement highly concerning for infiltrative tumor.  PSA is 0.26.  Chest CT on 10/20/2018 revealed diffuse/heterogeneous osseous sclerosis involving the visualized axial and appendicular skeleton. Differential considerations include renal osteodystrophy, diffuse osseous metastases secondary to malignancy (most commonly prostate cancer), or infiltrative disorders including lymphoma.  Myeloma work-up was negative:  SPEP and free light chains.  24 hour UPEP is pending.  LDH was 1239 on 10/19/2018.  Bone marrow aspirate was a dry tap.  Preliminary bone marrow biopsy reveals blastic plasmacytoid dendritic neoplasm.  Symptomatically, he remains fatigued.  Endurance remains limited.  Legs ache.  He has intermittent back pain.  Exam reveals no adenopathy or hepatosplenomegaly.    Plan:   1.  Hematology/Oncology:  Bone marrow aspirate and biopsy preliminary diagnosis today reported as blastic plasmacytoid dendritic neoplasm  Per pathology, differential includes acute myeloid leukemia (doubt).  Pathology notes CD123 testing needed to confirm diagnosis of BPD.             Bone marrow material can be sent to [facility] for testing (material available).  Discussed diagnosis with patient.  Discussed plan to transfer patient to [facility].  Patient in agreement.  Spoke with Dr. Hoyt Koch, hematology-oncology fellow.  Attending is Dr. Katina Dung.  Requests tumor lysis labs (BMP, uric acid, phosphorus) and DIC labs (PT, PTT, fibrinogen).  Plan to start IVF (NS 100 cc/hr) if creatinine increasing.  Start allopurinol if uric acid elevated.  Spoke with patient's nurse and Dr. Estanislado Pandy, hospitalist.   Lequita Asal, MD  10/25/2018, 6:16 PM

## 2018-10-26 ENCOUNTER — Ambulatory Visit
Admit: 2018-10-26 | Discharge: 2018-11-13 | Disposition: A | Discharge status: Skilled Nursing Facility | Payer: MEDICARE | Source: Other Acute Inpatient Hospital | Admitting: Hematology & Oncology

## 2018-10-26 ENCOUNTER — Ambulatory Visit: Admit: 2018-10-26 | Discharge: 2018-10-27 | Attending: General Practice | Primary: General Practice

## 2018-10-26 DIAGNOSIS — R69 Illness, unspecified: Secondary | ICD-10-CM | POA: Diagnosis not present

## 2018-10-26 DIAGNOSIS — I129 Hypertensive chronic kidney disease with stage 1 through stage 4 chronic kidney disease, or unspecified chronic kidney disease: Secondary | ICD-10-CM | POA: Diagnosis not present

## 2018-10-26 DIAGNOSIS — R509 Fever, unspecified: Secondary | ICD-10-CM | POA: Diagnosis not present

## 2018-10-26 DIAGNOSIS — E1142 Type 2 diabetes mellitus with diabetic polyneuropathy: Secondary | ICD-10-CM | POA: Diagnosis not present

## 2018-10-26 DIAGNOSIS — R531 Weakness: Secondary | ICD-10-CM | POA: Diagnosis not present

## 2018-10-26 DIAGNOSIS — D61810 Antineoplastic chemotherapy induced pancytopenia: Secondary | ICD-10-CM | POA: Diagnosis not present

## 2018-10-26 DIAGNOSIS — C864 Blastic NK-cell lymphoma: Secondary | ICD-10-CM | POA: Diagnosis not present

## 2018-10-26 DIAGNOSIS — N183 Chronic kidney disease, stage 3 (moderate): Secondary | ICD-10-CM | POA: Diagnosis not present

## 2018-10-26 DIAGNOSIS — R410 Disorientation, unspecified: Secondary | ICD-10-CM | POA: Diagnosis not present

## 2018-10-26 DIAGNOSIS — I358 Other nonrheumatic aortic valve disorders: Secondary | ICD-10-CM | POA: Diagnosis not present

## 2018-10-26 DIAGNOSIS — N179 Acute kidney failure, unspecified: Secondary | ICD-10-CM | POA: Diagnosis not present

## 2018-10-26 DIAGNOSIS — I69318 Other symptoms and signs involving cognitive functions following cerebral infarction: Secondary | ICD-10-CM | POA: Diagnosis not present

## 2018-10-26 DIAGNOSIS — Z794 Long term (current) use of insulin: Secondary | ICD-10-CM | POA: Diagnosis not present

## 2018-10-26 DIAGNOSIS — Z9981 Dependence on supplemental oxygen: Secondary | ICD-10-CM | POA: Diagnosis not present

## 2018-10-26 DIAGNOSIS — M6281 Muscle weakness (generalized): Secondary | ICD-10-CM | POA: Diagnosis not present

## 2018-10-26 DIAGNOSIS — I38 Endocarditis, valve unspecified: Secondary | ICD-10-CM | POA: Diagnosis not present

## 2018-10-26 DIAGNOSIS — R279 Unspecified lack of coordination: Secondary | ICD-10-CM | POA: Diagnosis not present

## 2018-10-26 DIAGNOSIS — E1140 Type 2 diabetes mellitus with diabetic neuropathy, unspecified: Secondary | ICD-10-CM | POA: Diagnosis not present

## 2018-10-26 DIAGNOSIS — Z452 Encounter for adjustment and management of vascular access device: Secondary | ICD-10-CM | POA: Diagnosis not present

## 2018-10-26 DIAGNOSIS — I339 Acute and subacute endocarditis, unspecified: Secondary | ICD-10-CM | POA: Diagnosis not present

## 2018-10-26 DIAGNOSIS — I10 Essential (primary) hypertension: Secondary | ICD-10-CM | POA: Diagnosis not present

## 2018-10-26 DIAGNOSIS — C7951 Secondary malignant neoplasm of bone: Secondary | ICD-10-CM | POA: Diagnosis not present

## 2018-10-26 DIAGNOSIS — A419 Sepsis, unspecified organism: Secondary | ICD-10-CM | POA: Diagnosis not present

## 2018-10-26 DIAGNOSIS — R000 Tachycardia, unspecified: Secondary | ICD-10-CM | POA: Diagnosis not present

## 2018-10-26 DIAGNOSIS — M6250 Muscle wasting and atrophy, not elsewhere classified, unspecified site: Secondary | ICD-10-CM | POA: Diagnosis not present

## 2018-10-26 DIAGNOSIS — R911 Solitary pulmonary nodule: Secondary | ICD-10-CM | POA: Diagnosis not present

## 2018-10-26 DIAGNOSIS — C9300 Acute monoblastic/monocytic leukemia, not having achieved remission: Secondary | ICD-10-CM | POA: Diagnosis not present

## 2018-10-26 DIAGNOSIS — E119 Type 2 diabetes mellitus without complications: Secondary | ICD-10-CM | POA: Diagnosis not present

## 2018-10-26 DIAGNOSIS — R5381 Other malaise: Secondary | ICD-10-CM | POA: Diagnosis not present

## 2018-10-26 DIAGNOSIS — C9200 Acute myeloblastic leukemia, not having achieved remission: Secondary | ICD-10-CM | POA: Diagnosis not present

## 2018-10-26 DIAGNOSIS — Z5111 Encounter for antineoplastic chemotherapy: Secondary | ICD-10-CM | POA: Diagnosis not present

## 2018-10-26 DIAGNOSIS — E1122 Type 2 diabetes mellitus with diabetic chronic kidney disease: Secondary | ICD-10-CM | POA: Diagnosis not present

## 2018-10-26 DIAGNOSIS — Z0120 Encounter for dental examination and cleaning without abnormal findings: Principal | ICD-10-CM

## 2018-10-26 LAB — MISC LABCORP TEST (SEND OUT): Labcorp test code: 164608

## 2018-10-26 LAB — FUNGITELL, SERUM: FUNGITELL RESULT: 46 pg/mL (ref <80)

## 2018-10-26 MED ORDER — GENERIC EXTERNAL MEDICATION
2.00 | Status: DC
Start: ? — End: 2018-10-26

## 2018-10-26 MED ORDER — ACETAMINOPHEN 325 MG PO TABS
650.00 | ORAL_TABLET | ORAL | Status: DC
Start: ? — End: 2018-10-26

## 2018-10-26 MED ORDER — INSULIN LISPRO 100 UNIT/ML ~~LOC~~ SOLN
1.00 | SUBCUTANEOUS | Status: DC
Start: 2018-10-26 — End: 2018-10-26

## 2018-10-26 MED ORDER — INSULIN GLARGINE 100 UNIT/ML ~~LOC~~ SOLN
20.00 | SUBCUTANEOUS | Status: DC
Start: 2018-10-26 — End: 2018-10-26

## 2018-10-26 MED ORDER — GENERIC EXTERNAL MEDICATION
2.00 | Status: DC
Start: 2018-10-27 — End: 2018-10-26

## 2018-10-26 MED ORDER — AMLODIPINE BESYLATE 5 MG PO TABS
5.00 | ORAL_TABLET | ORAL | Status: DC
Start: 2018-10-27 — End: 2018-10-26

## 2018-10-26 MED ORDER — PRAVASTATIN SODIUM 20 MG PO TABS
10.00 | ORAL_TABLET | ORAL | Status: DC
Start: 2018-11-13 — End: 2018-10-26

## 2018-10-26 MED ORDER — SODIUM CHLORIDE 0.9 % IV SOLN
100.00 | INTRAVENOUS | Status: DC
Start: ? — End: 2018-10-26

## 2018-10-26 MED ORDER — MELATONIN 3 MG PO TABS
3.00 | ORAL_TABLET | ORAL | Status: DC
Start: ? — End: 2018-10-26

## 2018-10-26 MED ORDER — GENERIC EXTERNAL MEDICATION
1000.00 | Status: DC
Start: ? — End: 2018-10-26

## 2018-10-26 MED ORDER — ALUM & MAG HYDROXIDE-SIMETH 400-400-40 MG/5ML PO SUSP
30.00 | ORAL | Status: DC
Start: ? — End: 2018-10-26

## 2018-10-26 MED ORDER — ASPIRIN 81 MG PO CHEW
81.00 | CHEWABLE_TABLET | ORAL | Status: DC
Start: 2018-10-27 — End: 2018-10-26

## 2018-10-26 MED ORDER — GENERIC EXTERNAL MEDICATION
4.00 | Status: DC
Start: ? — End: 2018-10-26

## 2018-10-26 MED ORDER — HEPARIN SODIUM (PORCINE) 5000 UNIT/ML IJ SOLN
5000.00 | INTRAMUSCULAR | Status: DC
Start: 2018-10-26 — End: 2018-10-26

## 2018-10-26 MED ORDER — INFLUENZA VAC SPLIT QUAD 0.5 ML IM SUSY
0.50 | PREFILLED_SYRINGE | INTRAMUSCULAR | Status: DC
Start: ? — End: 2018-10-26

## 2018-10-26 MED ORDER — DEXTROSE 10 % IV SOLN
25.00 | INTRAVENOUS | Status: DC
Start: ? — End: 2018-10-26

## 2018-10-26 MED ORDER — ALLOPURINOL 300 MG PO TABS
300.00 | ORAL_TABLET | ORAL | Status: DC
Start: 2018-10-27 — End: 2018-10-26

## 2018-10-26 NOTE — Progress Notes (Signed)
[facility] arrived to transport patient to [place] at [facility] All belongings packed and sent with patient. Tele-box 40-25 returned. All questions/concerns from patient and transport personnel addressed.

## 2018-10-27 DIAGNOSIS — M6281 Muscle weakness (generalized): Secondary | ICD-10-CM | POA: Diagnosis not present

## 2018-10-27 DIAGNOSIS — C864 Blastic NK-cell lymphoma: Secondary | ICD-10-CM | POA: Diagnosis not present

## 2018-10-27 DIAGNOSIS — I358 Other nonrheumatic aortic valve disorders: Secondary | ICD-10-CM | POA: Diagnosis not present

## 2018-10-27 DIAGNOSIS — N183 Chronic kidney disease, stage 3 (moderate): Secondary | ICD-10-CM | POA: Diagnosis not present

## 2018-10-27 DIAGNOSIS — I38 Endocarditis, valve unspecified: Secondary | ICD-10-CM | POA: Diagnosis not present

## 2018-10-27 LAB — FUNGAL ANTIBODIES PANEL, ID-BLOOD
ASPERGILLUS FLAVUS: NEGATIVE
ASPERGILLUS NIGER: NEGATIVE
Aspergillus fumigatus, IgG: NEGATIVE
BLASTOMYCES ABS, QN, DID: NEGATIVE
Histoplasma Ab, Immunodiffusion: NEGATIVE

## 2018-10-28 DIAGNOSIS — R531 Weakness: Secondary | ICD-10-CM | POA: Diagnosis not present

## 2018-10-28 DIAGNOSIS — C7951 Secondary malignant neoplasm of bone: Secondary | ICD-10-CM | POA: Diagnosis not present

## 2018-10-28 DIAGNOSIS — I358 Other nonrheumatic aortic valve disorders: Secondary | ICD-10-CM | POA: Diagnosis not present

## 2018-10-28 DIAGNOSIS — I38 Endocarditis, valve unspecified: Secondary | ICD-10-CM | POA: Diagnosis not present

## 2018-10-28 DIAGNOSIS — M6281 Muscle weakness (generalized): Secondary | ICD-10-CM | POA: Diagnosis not present

## 2018-10-28 DIAGNOSIS — C9200 Acute myeloblastic leukemia, not having achieved remission: Secondary | ICD-10-CM | POA: Diagnosis not present

## 2018-10-28 DIAGNOSIS — C864 Blastic NK-cell lymphoma: Secondary | ICD-10-CM | POA: Diagnosis not present

## 2018-10-28 DIAGNOSIS — R410 Disorientation, unspecified: Secondary | ICD-10-CM | POA: Diagnosis not present

## 2018-10-29 DIAGNOSIS — R410 Disorientation, unspecified: Secondary | ICD-10-CM | POA: Diagnosis not present

## 2018-10-29 DIAGNOSIS — C9200 Acute myeloblastic leukemia, not having achieved remission: Secondary | ICD-10-CM | POA: Diagnosis not present

## 2018-10-29 DIAGNOSIS — I358 Other nonrheumatic aortic valve disorders: Secondary | ICD-10-CM | POA: Diagnosis not present

## 2018-10-29 DIAGNOSIS — I38 Endocarditis, valve unspecified: Secondary | ICD-10-CM | POA: Diagnosis not present

## 2018-10-29 DIAGNOSIS — C864 Blastic NK-cell lymphoma: Secondary | ICD-10-CM | POA: Diagnosis not present

## 2018-10-29 DIAGNOSIS — R000 Tachycardia, unspecified: Secondary | ICD-10-CM | POA: Diagnosis not present

## 2018-10-29 DIAGNOSIS — M6281 Muscle weakness (generalized): Secondary | ICD-10-CM | POA: Diagnosis not present

## 2018-10-30 DIAGNOSIS — I38 Endocarditis, valve unspecified: Secondary | ICD-10-CM | POA: Diagnosis not present

## 2018-10-30 DIAGNOSIS — M6281 Muscle weakness (generalized): Secondary | ICD-10-CM | POA: Diagnosis not present

## 2018-10-30 DIAGNOSIS — R410 Disorientation, unspecified: Secondary | ICD-10-CM | POA: Diagnosis not present

## 2018-10-30 DIAGNOSIS — C864 Blastic NK-cell lymphoma: Secondary | ICD-10-CM | POA: Diagnosis not present

## 2018-10-30 DIAGNOSIS — R000 Tachycardia, unspecified: Secondary | ICD-10-CM | POA: Diagnosis not present

## 2018-10-30 DIAGNOSIS — R911 Solitary pulmonary nodule: Secondary | ICD-10-CM | POA: Diagnosis not present

## 2018-10-30 DIAGNOSIS — C9200 Acute myeloblastic leukemia, not having achieved remission: Principal | ICD-10-CM

## 2018-10-30 LAB — MISC LABCORP TEST (SEND OUT): Labcorp test code: 164624

## 2018-10-30 MED ORDER — VENETOCLAX 100 MG TABLET
ORAL_TABLET | Freq: Every day | ORAL | 2 refills | 0 days | Status: CP
Start: 2018-10-30 — End: 2019-01-02
  Filled 2018-11-07: qty 120, 30d supply, fill #0

## 2018-10-30 NOTE — Unmapped (Signed)
[facility] Progress Note    Interval Events- Patient was agitated overnight during lab draws and vitals, was aggressive towards nursing staff. This AM is again back to baseline, A&Ox2, doesn't remember events from overnight. Switched seroquel to zyprexa. Fevered this AM to 38.2, but no signs or symptoms of infection. Blood cultures redrawn. Felt to be most c/w AML, particularly given elevated LDH and uptrending WBC prior to starting hydrea. Will CTM. Sinus tachycardia is stable, again thought to be due to AML vs hypovolemia; will order fluid resuscitation. Plan is for PET today and to start aza/venetoclax. Dentistry considering tooth extraction tomorrow, will follow up on costs and pose these to patient and brother.      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 Kyle Huffman is a 67 y.o. male with PMHx of HTN, T2DM, CKD-III that presented to [facility] with CN aortic valve endocarditis and monocytic AML vs BPDCN, receiving C1 of aza/venetoclax (C1D1 10/30/18).     Culture-Negative Aortic Valve Endocarditis: Patient met sepsis criteria on admission to OSH and source was felt to be multiple dental abscesses seen on CT MaxFac. Multiple sets of blood cultures are NG at 5 days, however he was taking oral amoxicillin on admission. Serologies for Bartonella, Chlamydia, Legionella, and Coxiella were done and were negative. TEE was performed on 11/14 that showed medium-sized (0.6cm x0.9cm) vegetation on the left ventricular aspect of the left coronary cusp; TTE here 11/25 confirmed. S/p broad spectrum abx at OSH from 11/8- 11/21. No signs of HF, blood cxs continue to be clear. DDx also includes marantic endocarditis 2/2 leukemia. Fevered to 38.2 11/26 AM, no clear infectious source and felt to likely be due to AML as below, but BCX redrawn.   -- Blood cultures x3, NGTD 11/22  - f/u repeat BCx 11/26   -- IV vanc dosed per pharmacy, Ceftriaxone 2g q24h; at least 4 weeks of IV abx  -- Consulted Dentistry for tooth extractions; possibly 11/27 AM   -- ICID consulted, appreciate recs; Brucella, tularensis abs and febrile agglutinins sent, repeat TTE ordered, consider vertebral bx to potentially increase our ability to identify causative organisms (holding off for now, will see what PET/CT shows)  ??  Monocytic AML vs BPDCN: Preliminary pathology from BMBx at [facility] showed blastic plasmacytoid dendritic neoplasm with a DDx that also includes CD56+ AML. BMBx results here 11/22 c/f monocytic AML with 20-30% blasts, special stain for BPDCN pending. No skin lesions on exam. LDH continuing to uptrend, c/f disease progression; will start chemo 11/26.  -- S/p BMBx, awaiting special staining to assess for BPDCN   - PET/CT, NPO @ [facility] 11/26  -- Allopurinol 300 mg daily   - Aza/venetoclax C1D1 11/26  - TLS/DIC labs BID   -- Transfusion Thresholds: Hgb <7, PLT <10, fibrinogen <150  - calorie count given poor PO and nutritional status   - myeloid mutation panel sent   - hydrea 2000 mg TID  Sinus Tachycardia: Stable. Patient presented with intermittent sinus tachycardia to 110s. 11/25 HR increased to 120s-130s, and has been stably so. All other VSS, EKG with sinus tach. No signs/sx of infection. No new O2 requirement. Non-toxic appearing on exam. Likely due to his underlying disease process, also in setting of extremely poor PO intake, dry on exam.   - CTM  - 1 L bolus, maintenance fluids   ??  Bilateral Lower Extremity Weakness: Stable. MRI T/L Spine done at [facility] showed neural foraminal narrowing at L3-4 and L5-S1 (moderate on the right side). Also showed diffusely abnormal bone marrow signal in the spine concerning for infiltrative tumor. No canal narrowing. Strength seems relatively symmetric, with mild diffuse weakness, though patient able to ambulate. Question is whether this could be due to CNS component of his leukemia, ?due to infection or simply due to deconditioning in the setting of illness.   -- PT/OT  -- LP attempted 11/24, patient did not tolerate presumably due to discomfort with positioning   - ICID consulted, appreciate recs; consider vertebral biopsy for poss infectious source, will hold off for now in favor of PET/CT for further elucidation      Delirium: Patient intermittently A&Ox3, but at night tends to get agitated and combative when care is attempted. Patient notably has underlying brain injury, previous strokes. This in combo with current illnesses make him high risk for recurrent delirium.  - delirium precautions   - zyprexa 5 mg QPM  ??  T2DM: Home regimen is Lantus 42u nightly, empagliflozin 10mg  daily, and metformin 1000mg  BID. Last HbA1c 6.7% in August 2019.   -- Lantus 10 u nightly (home dose 40u), decreased as pt is not eating much and AM glucose was low  -- SSI with POCT glucose checks  -- Hold home metformin, empagliflozin  ??  ?AKI on CKD-III: Likely pre-renal. Creatinine varies from 0.9-1.4, near baseline on admission. Closer to 0.8-0.9 on admission to [facility], up-trended to ~1.6 on admission to [facility]. Holding ACEI and diuretic. LDH and Cr are increasing, but electrolytes and uric acid wnl; unlikely to be due to TLS. Patient has very poor PO intake and appears very dry on exam.   -- Avoid nephrotoxins, renally dose medications as appropriate  -- Hold ACEI and HCTZ as above  -- Encourage PO intake     Hypertension: Home regimen is HCTZ 12.5mg  daily, lisinopril 20mg  daily. Was getting metoprolol tartrate as substitute for HCTZ at OSH and diltiazem. Favored starting amlodipine over these options.   -- amlodipine 10 mg daily  -- Hold lisinopril 20mg  daily  -- Discontinue HCTZ  ??  Daily Checklist:  Diet: NPO for PET   DVT PPx: Heparin 5000mg  q8h   GI PPx: Not Indicated  Code Status: DNR and DNI  Dispo: Admit to MDE1, Inpatient, Floor  ___________________________________________________________________    Labs/Studies:  Labs and Studies from the last 24hrs per EMR and Reviewed    Objective:  Temp:  [37 ??C-38.2 ??C] 37.7 ??C  Heart Rate:  [129-147] 129  Resp:  [20-30] 28  BP: (107-175)/(54-72) 107/54  SpO2:  [91 %-94 %] 94 %,     Intake/Output Summary (Last 24 hours) at 10/30/2018 1235  Last data filed at 10/30/2018 0957  Gross per 24 hour   Intake 2085 ml   Output 550 ml   Net 1535 ml       Patient Lines/Drains/Airways Status    Active Active Lines, Drains, & Airways     Name:   Placement date:  Placement time:   Site:   Days:    CVC Triple Lumen 10/29/18 Non-tunneled Right Internal jugular   10/29/18    0851    Internal jugular   1              GEN:  NAD, resting comfortably in bed, pleasant   HEENT: EOMI, mucous membranes very dry. Poor dentition.   CARDIO: Tachycardic, regular rhythm, faint systolic murmur LUSB  RESPIRATORY: Normal work of breathing on RA.   SKIN: No diaphoresis, skin very dry.   MSK: Evidence of muscle wasting, particularly in thighs.  Neuro: A&O x2. CN intact. Strength 4/5 in bilateral lower extremities. Sensation grossly intact to light touch     Salome Arnt, M.D.

## 2018-10-31 ENCOUNTER — Ambulatory Visit: Admit: 2018-10-31 | Discharge: 2018-11-01 | Payer: MEDICARE

## 2018-10-31 ENCOUNTER — Ambulatory Visit: Payer: Medicare HMO | Admitting: Family Medicine

## 2018-10-31 DIAGNOSIS — R410 Disorientation, unspecified: Secondary | ICD-10-CM | POA: Diagnosis not present

## 2018-10-31 DIAGNOSIS — R000 Tachycardia, unspecified: Secondary | ICD-10-CM | POA: Diagnosis not present

## 2018-10-31 DIAGNOSIS — M6281 Muscle weakness (generalized): Secondary | ICD-10-CM | POA: Diagnosis not present

## 2018-10-31 DIAGNOSIS — C864 Blastic NK-cell lymphoma: Secondary | ICD-10-CM | POA: Diagnosis not present

## 2018-10-31 DIAGNOSIS — I38 Endocarditis, valve unspecified: Secondary | ICD-10-CM | POA: Diagnosis not present

## 2018-10-31 DIAGNOSIS — K047 Periapical abscess without sinus: Principal | ICD-10-CM

## 2018-10-31 DIAGNOSIS — K029 Dental caries, unspecified: Secondary | ICD-10-CM

## 2018-10-31 DIAGNOSIS — K056 Periodontal disease, unspecified: Secondary | ICD-10-CM

## 2018-10-31 LAB — MISC LABCORP TEST (SEND OUT): LABCORP TEST CODE: 820624

## 2018-10-31 LAB — CBC W/ AUTO DIFF
BASOPHILS ABSOLUTE COUNT: 0.5 10*9/L — ABNORMAL HIGH (ref 0.0–0.1)
BASOPHILS RELATIVE PERCENT: 5.8 %
EOSINOPHILS ABSOLUTE COUNT: 0.1 10*9/L (ref 0.0–0.4)
EOSINOPHILS RELATIVE PERCENT: 0.9 %
HEMATOCRIT: 23.6 % — ABNORMAL LOW (ref 41.0–53.0)
HEMOGLOBIN: 7.6 g/dL — ABNORMAL LOW (ref 13.5–17.5)
LARGE UNSTAINED CELLS: 27 % — ABNORMAL HIGH (ref 0–4)
LYMPHOCYTES ABSOLUTE COUNT: 1.5 10*9/L (ref 1.5–5.0)
LYMPHOCYTES RELATIVE PERCENT: 19 %
MEAN CORPUSCULAR HEMOGLOBIN CONC: 32.3 g/dL (ref 31.0–37.0)
MEAN CORPUSCULAR HEMOGLOBIN: 29.4 pg (ref 26.0–34.0)
MEAN CORPUSCULAR VOLUME: 90.9 fL (ref 80.0–100.0)
MEAN PLATELET VOLUME: 11.3 fL — ABNORMAL HIGH (ref 7.0–10.0)
MONOCYTES ABSOLUTE COUNT: 0.3 10*9/L (ref 0.2–0.8)
MONOCYTES RELATIVE PERCENT: 4.2 %
NEUTROPHILS ABSOLUTE COUNT: 3.8 10*9/L (ref 2.0–7.5)
NEUTROPHILS RELATIVE PERCENT: 49 %
NUCLEATED RED BLOOD CELLS: 2 /100{WBCs} (ref <=4)
WBC ADJUSTED: 7.8 10*9/L (ref 4.5–11.0)

## 2018-10-31 LAB — URIC ACID
Urate:MCnc:Pt:Ser/Plas:Qn:: 3.7 — ABNORMAL LOW
Urate:MCnc:Pt:Ser/Plas:Qn:: 3.9 — ABNORMAL LOW

## 2018-10-31 LAB — COMPREHENSIVE METABOLIC PANEL
ALBUMIN: 2.2 g/dL — ABNORMAL LOW (ref 3.5–5.0)
ALBUMIN: 2.2 g/dL — ABNORMAL LOW (ref 3.5–5.0)
ALKALINE PHOSPHATASE: 456 U/L — ABNORMAL HIGH (ref 38–126)
ALT (SGPT): 16 U/L (ref <50)
ALT (SGPT): 23 U/L (ref <50)
ANION GAP: 9 mmol/L (ref 7–15)
ANION GAP: 6 mmol/L — ABNORMAL LOW (ref 7–15)
AST (SGOT): 44 U/L (ref 19–55)
AST (SGOT): 56 U/L — ABNORMAL HIGH (ref 19–55)
BILIRUBIN TOTAL: 0.4 mg/dL (ref 0.0–1.2)
BLOOD UREA NITROGEN: 29 mg/dL — ABNORMAL HIGH (ref 7–21)
BLOOD UREA NITROGEN: 29 mg/dL — ABNORMAL HIGH (ref 7–21)
BUN / CREAT RATIO: 19
CALCIUM: 6.9 mg/dL — CL (ref 8.5–10.2)
CALCIUM: 6.9 mg/dL — CL (ref 8.5–10.2)
CHLORIDE: 101 mmol/L (ref 98–107)
CHLORIDE: 100 mmol/L (ref 98–107)
CO2: 24 mmol/L (ref 22.0–30.0)
CO2: 27 mmol/L (ref 22.0–30.0)
CREATININE: 1.62 mg/dL — ABNORMAL HIGH (ref 0.70–1.30)
EGFR CKD-EPI AA MALE: 50 mL/min/{1.73_m2} — ABNORMAL LOW (ref >=60)
EGFR CKD-EPI AA MALE: 52 mL/min/{1.73_m2} — ABNORMAL LOW (ref >=60)
EGFR CKD-EPI NON-AA MALE: 43 mL/min/{1.73_m2} — ABNORMAL LOW (ref >=60)
EGFR CKD-EPI NON-AA MALE: 45 mL/min/{1.73_m2} — ABNORMAL LOW (ref >=60)
GLUCOSE RANDOM: 214 mg/dL — ABNORMAL HIGH (ref 65–179)
GLUCOSE RANDOM: 253 mg/dL — ABNORMAL HIGH (ref 65–179)
POTASSIUM: 4 mmol/L (ref 3.5–5.0)
POTASSIUM: 4.2 mmol/L (ref 3.5–5.0)
PROTEIN TOTAL: 4.8 g/dL — ABNORMAL LOW (ref 6.5–8.3)
PROTEIN TOTAL: 4.6 g/dL — ABNORMAL LOW (ref 6.5–8.3)
SODIUM: 133 mmol/L — ABNORMAL LOW (ref 135–145)

## 2018-10-31 LAB — BRUCELLA ANTIBODIES: BRUCELLA IGG: NEGATIVE

## 2018-10-31 LAB — LACTATE DEHYDROGENASE
Lactate dehydrogenase:CCnc:Pt:Ser/Plas:Qn:: 11053 — ABNORMAL HIGH
Lactate dehydrogenase:CCnc:Pt:Ser/Plas:Qn:: 8839 — ABNORMAL HIGH

## 2018-10-31 LAB — APTT: Coagulation surface induced:Time:Pt:PPP:Qn:Coag: 28.6

## 2018-10-31 LAB — FIBRINOGEN LEVEL: Lab: 571 — ABNORMAL HIGH

## 2018-10-31 LAB — BRUCELLA IGM: Brucella sp Ab.IgM:PrThr:Pt:Ser:Ord:IA: NEGATIVE

## 2018-10-31 LAB — SMEAR REVIEW

## 2018-10-31 LAB — PROTIME-INR: PROTIME: 16.5 s — ABNORMAL HIGH (ref 10.2–13.1)

## 2018-10-31 LAB — MAGNESIUM
MAGNESIUM: 1.8 mg/dL (ref 1.6–2.2)
Magnesium:MCnc:Pt:Ser/Plas:Qn:: 1.8

## 2018-10-31 LAB — MONOCYTES ABSOLUTE COUNT: Lab: 0.3

## 2018-10-31 LAB — SODIUM: Sodium:SCnc:Pt:Ser/Plas:Qn:: 133 — ABNORMAL LOW

## 2018-10-31 LAB — ALBUMIN: Albumin:MCnc:Pt:Ser/Plas:Qn:: 2.2 — ABNORMAL LOW

## 2018-10-31 LAB — PHOSPHORUS
PHOSPHORUS: 4.5 mg/dL (ref 2.9–4.7)
Phosphate:MCnc:Pt:Ser/Plas:Qn:: 4.5
Phosphate:MCnc:Pt:Ser/Plas:Qn:: 4.8 — ABNORMAL HIGH

## 2018-10-31 LAB — D-DIMER QUANTITATIVE (CH,ML,PD,ET): Lab: 4539 — ABNORMAL HIGH

## 2018-10-31 LAB — PROTIME: Lab: 16.5 — ABNORMAL HIGH

## 2018-10-31 NOTE — Unmapped (Addendum)
SNF Follow-up Issues:  [ ]  Diabetes: The goal is to titrate his lantus up to his home dose of 42 units nightly. We are sending him on lantus 15 units nightly with sliding scale (see below for sliding scale orders). As he begins feeling better, he will begin taking in more PO and will need his lantus titrated up. Hopefully with this uptitration, he will not need d/c home on lispro and sliding scale.  Sliding scale orders:  Blood Glucose   51-70 mg/dL  Give juice/crackers  16-109 mg/dL  0 units  604-540 mg/dL 2 units  981-191 mg/dL 4 units  478-295 mg/dL 6 units  621-308 mg/dL 8 units  657-846 mg/dL 10 units  > 962 mg/dL  12 units  [ ]  For his antibiotics, he will be on IV ceftriaxone 2g q24h and IV vancomycin 1250mg  q24h until 12/24. For his labs, he will need a weekly CBC w/diff, CMP, and vanc trough. Please fax the results to Dr. Bronson Huffman nurse navigator Kyle Huffman) Kyle Huffman at [phone]. Phone is [phone].  -Check next labs and vanc trough-- Monday 12/16    Appointments as follows:  12/12 with Kyle Huffman in [place], [place]  12/18 11:15am labs at [facility]  12/18 12:00pm with Kyle Huffman at [facility]  12/18 1:00pm with Kyle Moment NP at [facility]  12/23 1:00pm labs at [facility]  12/23 2:00pm bone marrow biopsy at [facility]  12/24 9:30am with Dr. Reynold Huffman at [facility]  12/26 8:15am labs at [facility]  12/26 9:00am with Dr. Vertell Huffman at [facility]    Outpatient Follow-Up:  [ ]  Please follow up creatinine and if showing improvement please resume home metformin.  [ ]  when abx are done, check and assess for need for levaquin ppx.  [ ]  discharged on IV micafungin to SNF. Assess how long this needs to be given for based on count recovery. If he were to need switched to posaconazole or fluconazole, he would need his venetaclax dosing changed by pharmacy.    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     Culture-Negative Aortic Valve Endocarditis:??Patient met sepsis criteria on admission to OSH and source was felt to be multiple dental abscesses seen on CT MaxFac; no other plausible source found. Multiple sets of blood cultures NG at 5 days, however he was taking oral amoxicillin on admission. Serologies for HACEK organisms??were done and were negative. TEE was performed on 11/14 that showed medium-sized (0.6cm x0.9cm) vegetation on the left ventricular aspect of the left coronary cusp; TTE here 11/25 confirmed. S/p broad spectrum abx at OSH from 11/8- 11/21; vanc/ceftriaxone continued here per ICID recs. No signs of HF. 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 Patient initially continued to fever intermittently during admission and infectious workup was repeated and continued to be negative. Dentistry evaluated the patient and pulled 4 teeth on 11/27. Chemotherapy was started 11/27 as well. Fever curve subsequently improved, though one more fever on 12/2 with NG blood cultures. PICC line placed on 12/4.  -- IV vanc and Ceftriaxone through 12/24 (4 week course)  ??  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 3q chromosome. FLT3 negative.??PET/CT 11/26 with diffuse uptake but limited to bone marrow. Using the MRC prognostic  model, he should be considered high risk. Started Aza/venetoclax C1D1 11/27. Patient subsequently improved significantly in terms of fevers, weakness, tachycardia, delirium, TLS/DIC labs. He will continue venetoclax outpatient. He will get follow-up bone marrow biopsy week of 12/23. Cycle 2 of chemotherapy will start around 1/2. He was discharged on valtrex, and IV micafungin to SNF. Discharge ANC 0.1. Will need assessed for antimicrobial ppx when off abx.   - myeloid mutation panel sent Sinus Tachycardia: Patient presented with intermittent sinus tachycardia to 110s. 11/25 HR increased to 120s-140s; all other VSS, EKG with sinus tach. No new O2 requirement. Non-toxic appearing on exam. Likely due to his underlying disease process, also in setting of extremely poor PO intake, dry on exam. Improved with chemo, IV fluids.   ??  Bilateral Lower Extremity Weakness:?? MRI T/L Spine done at OSH showed neural foraminal narrowing at L3-4 and L5-S1 (moderate on the right side). Also showed diffusely abnormal bone marrow signal in the spine concerning for infiltrative tumor, PET/CT verified these findings. No canal narrowing. Strength relatively symmetric, with mild diffuse weakness, seemingly pain-related, though patient able to ambulate. ICID recommended vertebral biopsy, but this was deferred given diffuse skeletal uptake and no clear isolated potential infectious source. Question is whether this could be due to CNS component of his leukemia, or simply due to a combination of pain and deconditioning secondary to his illness. LP attempted 11/24 but patient could not tolerate presumably due to discomfort with positioning. Patient improved during admission with onset of chemotherapy. Going to SNF for further rehab.  ??  Delirium: On admission patient intermittently A&Ox3, but at night tended to get agitated and combative when care attempted. Patient notably has underlying brain injury, previous strokes. This in combo with current illnesses make him high risk for recurrent delirium. Improved significantly after chemo started. Discharged on Zyprexa 2.5 mg QPM.  ??  T2DM:??Home regimen is Lantus 42u nightly, empagliflozin 10mg  daily, and metformin 1000mg  BID. Last HbA1c 6.7% in August 2019. Patient was given Lantus and mealtime lispro titrated to glucoses as he had variable PO intake and consumed mostly sugary foods. Held home metformin, empagliflozin. Discharged to SNF on lantus 15 units nightly with sliding scale at the SNF. The goal would be that as he begins to feel better his intake would improve and his insulin would need titrated appropriately. The goal is to get him back to home dose of his lantus with PO intake improved and not d/c on sliding scale.  ??  AKI on CKD-III:??Creatinine varies from 0.9-1.4. Closer to 0.8-0.9 on admission to OSH, up-trended to ~1.6 on admission to [facility], and was stable throughout admission despite IV fluids. Could be patient's new baseline. No evidence of TLS. Home lisinopril and HCTZ held throughout admission. Discharge creatinine 1.46.  ??  Hypertension:??Home regimen is HCTZ 12.5mg  daily, lisinopril 20mg  daily (held due to AKI).??Was getting metoprolol tartrate as substitute for HCTZ at OSH, as well as diltiazem. We favored starting amlodipine 10mg  daily over these options. Discharged on amlodipine 10mg  daily with good pressure control.

## 2018-10-31 NOTE — Unmapped (Signed)
Per test claim for VENCLEXTA 100MG  TABLET at the [facility], patient needs Medication Assistance Program for Prior Authorization.

## 2018-10-31 NOTE — Unmapped (Signed)
Extraction Appointment     Kyle Huffman is a 67 y.o. male who was identified by name and DOB.  The patient presents to the hospital dental clinic for extraction of teeth #s 5, 20, 25 and 29.    Pt was transferred from [facility] on 11/21 due to needing a higher level of care and concern for relapsing AML and sepsis.  Patient was dx with aortic valve endocarditis while admitted at [facility] and dentistry was consulted to evaluate pt.    Patient also just started chemotherapy on 11/26 for relapsed AML.  He reports he is feeling well right now and would like to proceed with all extractions as treatment planned.  He denies any current dental pain or discomfort.    Patient Active Problem List   Diagnosis   ??? Chronic kidney disease (CKD), stage III (moderate) (CMS-[name])   ??? Hyperlipidemia associated with type 2 diabetes mellitus (CMS-[name])   ??? Type 2 diabetes mellitus with diabetic polyneuropathy, with long-term current use of insulin (CMS-[name])   ??? Aortic valve endocarditis   ??? Hypertension   ??? 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 Weakness of both lower extremities   ??? Acute myeloid leukemia not having achieved remission (CMS-[name])       Past Medical History:   Diagnosis Date   ??? Diabetes mellitus (CMS-[name])    ??? Hyperlipidemia    ??? Hypertension    ??? Stroke (CMS-[name])        Current Facility-Administered Medications on File Prior to Visit   Medication Dose Route Frequency Provider Last Rate Last Dose   ??? acetaminophen (TYLENOL) tablet 1,000 mg  1,000 mg Oral Q8H PRN Doren Custard, MD       ??? [COMPLETED] acetaminophen (TYLENOL) tablet 325 mg  325 mg Oral Once Doren Custard, MD   325 mg at 10/31/18 0118   ??? allopurinol (ZYLOPRIM) tablet 300 mg  300 mg Oral Daily Salome Arnt, MD   300 mg at 10/30/18 0844   ??? aluminum-magnesium hydroxide-simethicone (MAALOX MAX) 80-80-8 mg/mL oral suspension  30 mL Oral Q4H PRN Steward Drone, MD       ??? amLODIPine (NORVASC) tablet 10 mg  10 mg Oral Daily Salome Arnt, MD   10 mg at 10/30/18 0843   ??? aspirin chewable tablet 81 mg  81 mg Oral Daily Steward Drone, MD   81 mg at 10/30/18 0844   ??? cefTRIAXone (ROCEPHIN) 2 g in sodium chloride 0.9 % (NS) 100 mL IVPB-connector bag  2 g Intravenous Q24H Steward Drone, MD 200 mL/hr at 10/30/18 1623 2 g at 10/30/18 1623   ??? CHEMO CLARIFICATION ORDER   Other Continuous PRN Oris Drone, [facility]       ??? dextrose (D10W) 10% bolus 250 mL  25 g Intravenous Q30 Min PRN Steward Drone, MD       ??? [COMPLETED] Fluorine F-18 FDG 4-40 mCi IV  10.3 millicurie Intravenous Once Halford Decamp, MD   10.3 millicurie at 10/30/18 1359   ??? influenza vaccine quad (FLUARIX, FLULAVAL, FLUZONE) (6 MOS & UP) 2019-20  0.5 mL Intramuscular During hospitalization Halford Decamp, MD       ??? insulin glargine (LANTUS) injection 10 Units  10 Units Subcutaneous Nightly Heron Sabins, MD   10 Units at 10/30/18 2036   ??? insulin lispro (HumaLOG) injection 0-12 Units  0-12 Units Subcutaneous ACHS Salome Arnt, MD   4 Units  at 10/30/18 2048   ??? [EXPIRED] lactated Ringers infusion  125 mL/hr Intravenous Continuous Doren Custard, MD   Stopped at 10/31/18 [phone]   ??? melatonin tablet 3 mg  3 mg Oral Nightly PRN Steward Drone, MD   3 mg at 10/30/18 2313   ??? OLANZapine zydis (ZyPREXA) disintegrating tablet 5 mg  5 mg Oral Nightly Heron Sabins, MD   5 mg at 10/30/18 2048   ??? ondansetron (ZOFRAN-ODT) disintegrating tablet 4 mg  4 mg Oral Q8H PRN Steward Drone, MD        Or   ??? ondansetron [facility]) injection 4 mg  4 mg Intravenous Q8H PRN Steward Drone, MD       ??? pravastatin (PRAVACHOL) tablet 10 mg  10 mg Oral Nightly Steward Drone, MD   10 mg at 10/30/18 2048   ??? senna (SENOKOT) tablet 2 tablet  2 tablet Oral Nightly PRN Steward Drone, MD       ??? sevelamer (RENVELA) tablet 800 mg  800 mg Oral 3xd Meals Doren Custard, MD       ??? sodium chloride (NS) 0.9 % flush 10 mL  10 mL Intravenous BID Salome Arnt, MD   10 mL at 10/31/18 0900   ??? sodium chloride (NS) 0.9 % flush 10 mL  10 mL Intravenous BID Salome Arnt, MD   10 mL at 10/31/18 0900   ??? sodium chloride (NS) 0.9 % flush 10 mL  10 mL Intravenous BID Salome Arnt, MD   10 mL at 10/31/18 0900   ??? vancomycin (VANCOCIN) IVPB 1 g (premix)  1,000 mg Intravenous Q24H Heron Sabins, MD   1,000 mg at 10/30/18 0750     Current Outpatient Medications on File Prior to Visit   Medication Sig Dispense Refill   ??? aspirin (ECOTRIN) 81 MG tablet Take 81 mg by mouth daily.     ??? diltiazem (CARDIZEM) 30 MG tablet Take 30 mg by mouth every eight (8) hours.     ??? empagliflozin 10 mg Tab Take 10 mg by mouth daily.     ??? hydroCHLOROthiazide (HYDRODIURIL) 12.5 MG tablet Take 12.5 mg by mouth daily.     ??? insulin glargine (BASAGLAR KWIKPEN U-100 INSULIN) 100 unit/mL (3 mL) injection pen Inject 42 Units under the skin nightly.     ??? lisinopril (PRINIVIL,ZESTRIL) 20 MG tablet Take 20 mg by mouth daily.     ??? metFORMIN (GLUCOPHAGE) 1000 MG tablet Take 2,000 mg by mouth daily.      ??? simvastatin (ZOCOR) 5 MG tablet Take 5 mg by mouth nightly.     ??? venetoclax (VENCLEXTA) 100 mg tablet Take 4 tablets (400 mg total) by mouth daily with a meal and water. Do not chew, crush, or break tablets. 120 tablet 2       No Known Allergies    Beacon Questions completed.   1. Does your partner (caregivers) ever threaten you or try to control you? no  2. Is abuse, violence or sexual assault a problem for you in any way? no      Objective    Vitals:  Blood Pressure:  110/71 mmHg Pulse: 137 bpm pO2: 98%  Pain Score: 0/10 dental/oral pain    Pre-Med: Pt currently on antibiotics for endocarditis and other systemic infections  Labs: Platelets- 60 (10/31/18)    Radiographs: PAN from 10/26/18        Assessment: Teeth #s 5, 29, 25 and #29 necrotic  pulp with asymptomatic apical periodontitis; evidence of periapical radiolucency at apices of teeth indicative of abscess      Procedure      Routine Extractions teeth #5, #20 and #25 and Surgical Extraction #29  Consent reviewed with patient/legal guardian and signed.  Consent witnessed by Darryl Mangum.    Time out completed prior to start of procedure: verified patient's name, DOB and teeth to be extracted.  Topical anesthetic applied.  Type of Anesthesia used: 136mg  septo, 72mg  lido, 0.072mg  epi  Location of injection: URQ infiltration/palatal, LLQ infiltration/lingual, LRQ IANB, long buccal, lingual and infiltration  Aspiration negative    Throat curtain placed with 4x4 gauze.  Attention turned to tooth #29  Soft tissue reflection with woodson elevator.  Elevated with s/m/l elevators.  Attempted simple forceps extraction #29, PFM crown broke off along with coronal tooth structure.  Surgical handpiece used with copious irrigation to remove #29 distal bone.  #29 elevated with s/m/l elevators and extracted with forceps.    Attention turned to teeth #s 5, 20 and 25.  Soft tissue reflection with woodson elevator.  Elevated with s/m/l elevators.  Extracted teeth #s 5, 20 and 25 with dental forceps.    Throat curtain removed.  Site curetted and irrigated with copious saline. Buccal/lingual pressure applied to alveolar bone.  Sharp/bony spicules removed and smoothed with Rongeurs and bone file.  Gauze soaked in [place] and good hemostasis achieved with pressure.  Sutures/Gel Foam:   Gel foam placed in all extraction sites  3 simple interrupted 3-0 chromic gut sutures placed over #5, #20 and #29 ext sites.  Findings: Significant granulation tissue irrigated from ext sites #29 and #20.  Complications: None    Verbal and written post-op instructions given to patient/legal guardian.  Pt. tolerated procedure well.    Post-op vitals:  Blood Pressure:  130/65 mmHg Pulse: 132 bpm    Rx: Pain to be controlled by patient's care team.    Recommend firm gauze pressure, switching out for clean gauze every 30-40 mins for the rest of the day due to low platelet count.      All questions were welcomed/addressed and patient departed in stable condition.    Provider: Alda Berthold, DMD  DA: Darryl Mangum      NV: F/u PRN

## 2018-10-31 NOTE — Unmapped (Signed)
Pt remains free from falls/injury.  Pt was febrile.  Pt continues with sitter.  Pt had PET scan.  Pt denies needs. Will continue to monitor and reassess

## 2018-10-31 NOTE — Unmapped (Signed)
I was immediately available via phone/pager or present on site.  I reviewed and discussed the case with the resident and/or hygienist, but did not see the patient.  I agree with the assessment and plan as documented in the resident's note. Lequita Halt DDS

## 2018-10-31 NOTE — Unmapped (Addendum)
Pt Kyle Huffman/Ox2-3, AMS & agitation better overnight. Sitter remains at bedside. Febrile overnight despite multiple interventions. pt not due for cultures. Remains tachycardic, but asymptomatic. Over VS WDL. Continues to c/o BLE pain, tylenol given for pain as well as fever. No s/s of DVT. No falls/injuries during shift, continues to have weakness. No s/s of skin injuries. Continues MIVF, increased to 158ml/hr. Continues to report decrease in appetite. Pt to have possible dental extraction today & possibly start chemo. WCTM.   Problem: Adult Inpatient Plan of Care  Goal: Plan of Care Review  Outcome: Ongoing - Unchanged  Goal: Patient-Specific Goal (Individualization)  Outcome: Ongoing - Unchanged  Goal: Absence of Hospital-Acquired Illness or Injury  Outcome: Ongoing - Unchanged  Goal: Optimal Comfort and Wellbeing  Outcome: Ongoing - Unchanged  Goal: Readiness for Transition of Care  Outcome: Ongoing - Unchanged  Goal: Rounds/Family Conference  Outcome: Ongoing - Unchanged     Problem: Fall Injury Risk  Goal: Absence of Fall and Fall-Related Injury  Outcome: Ongoing - Unchanged     Problem: Self-Care Deficit  Goal: Improved Ability to Complete Activities of Daily Living  Outcome: Ongoing - Unchanged     Problem: Asthma Comorbidity  Goal: Maintenance of Asthma Control  Outcome: Ongoing - Unchanged     Problem: COPD Comorbidity  Goal: Maintenance of COPD Symptom Control  Outcome: Ongoing - Unchanged     Problem: Diabetes Comorbidity  Goal: Blood Glucose Level Within Desired Range  Outcome: Ongoing - Unchanged     Problem: Heart Failure Comorbidity  Goal: Maintenance of Heart Failure Symptom Control  Outcome: Ongoing - Unchanged     Problem: Hypertension Comorbidity  Goal: Blood Pressure in Desired Range  Outcome: Ongoing - Unchanged     Problem: Obstructive Sleep Apnea Risk or Actual (Comorbidity Management)  Goal: Unobstructed Breathing During Sleep  Outcome: Ongoing - Unchanged     Problem: Pain Chronic (Persistent) (Comorbidity Management)  Goal: Acceptable Pain Control and Functional Ability  Outcome: Ongoing - Unchanged     Problem: Seizure Disorder Comorbidity  Goal: Maintenance of Seizure Control  Outcome: Ongoing - Unchanged     Problem: Skin Injury Risk Increased  Goal: Skin Health and Integrity  Outcome: Ongoing - Unchanged

## 2018-10-31 NOTE — Unmapped (Signed)
MedE Progress Note    Interval Events- Patient was febrile to 39.7 overnight. States that he felt well, denies any new or worsening symptoms. Vitals were notable for stable sinus tachycardia. Fevers resolved by this AM. Will f/u Cx, but highest suspicion is still that fevers are due to AML. Patient will have infected teeth pulled today, which should help in multiple regards; will start amicar swishes to prevent mucosal bleeding. Otherwise complained of continued achy bilateral leg pain. Received IV fluids overnight with little improvement in tachycardia. Cr stable, LDH and blood counts downtrending. PET yesterday with diffuse bony uptake, none outside the marrow. Will start Aza/venetoclax today.      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 Kyle Huffman is a 67 y.o. 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     Culture-Negative Aortic Valve Endocarditis: Patient met sepsis criteria on admission to OSH and source was felt to be multiple dental abscesses seen on CT MaxFac. Multiple sets of blood cultures are NG at 5 days, however he was taking oral amoxicillin on admission. HACEK serologies negative. TEE was performed on 11/14 that showed medium-sized (0.6cm x0.9cm) vegetation on the left ventricular aspect of the left coronary cusp; TTE here 11/25 confirmed. S/p broad spectrum abx at OSH from 11/8- 11/21. No signs of HF, blood cxs continue to be clear. DDx also includes marantic endocarditis 2/2 leukemia. Continues to fever intermittently, no clear infectious source and felt to likely be due to AML as below, but BCX and other infectious workup repeated.   - f/u repeat BCx 11/26   -- IV vanc dosed per pharmacy, Ceftriaxone 2g q24h; at least 4 weeks of IV abx  -- Consulted Dentistry for tooth extractions; 11/27 AM; will add amicar swishes Q8H to prevent mucosal bleeding   -- ICID consulted, appreciate recs; Brucella, tularensis abs and febrile agglutinins sent, consider vertebral bx (holding off given diffuse bony uptake on PET/CT, unable to localize a potential infectious source)   ??  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 BMBx results here 11/22 c/f monocytic AML with 20-30% blasts, M0 by FAB, cytogenetics reveal a tri8 and a ???jumping??? 3q chromosome. Special stain for BPDCN pending. No skin lesions on exam. PET/CT 11/26 with diffuse uptake but limited to bone marrow. Will start chemo 11/27.  -- S/p BMBx, awaiting special staining to assess for BPDCN   -- Allopurinol 300 mg daily   - Aza/venetoclax C1D1 11/26  - TLS/DIC labs BID   -- Transfusion Thresholds: Hgb <7, PLT <10, fibrinogen <150  - calorie count given poor PO and nutritional status   - myeloid mutation panel sent     Sinus Tachycardia: Stable. Patient presented with intermittent sinus tachycardia to 110s. 11/25 HR  increased to 120s-130s, and has been stably so. All other VSS, EKG with sinus tach. Intermittently febrile but no other signs/sx of infection (see above). No new O2 requirement. Non-toxic appearing on exam. Likely due to his underlying disease process, also in setting of extremely poor PO intake, dry on exam.   - CTM  - maintenance fluids   ??  Bilateral Lower Extremity Weakness: Stable. MRI T/L Spine done at [facility] showed neural foraminal narrowing at L3-4 and L5-S1 (moderate on the right side). Also showed diffusely abnormal bone marrow signal in the spine concerning for infiltrative tumor. No canal narrowing. Strength seems relatively symmetric, with mild diffuse weakness, though patient able to ambulate. Question is whether this could be due to CNS component of his leukemia, deconditioning in the setting of illness, vs due to significant bony involvement seen on PET/CT 11/27.   -- PT/OT  -- LP attempted 11/24, patient did not tolerate presumably due to discomfort with positioning   - ICID consulted, appreciate recs; consider vertebral biopsy for poss infectious source, however PET demonstrated diffuse bony disease, thus weakness unlikely to be due to localized infection and more likely due to disease burden     Delirium: Patient intermittently A&Ox3, but at night tends to get agitated and combative when care is attempted. Patient notably has underlying brain injury, previous strokes. This in combo with current illnesses make him high risk for recurrent delirium.  - delirium precautions   - zyprexa 5 mg QPM  ??  T2DM: Home regimen is Lantus 42u nightly, empagliflozin 10mg  daily, and metformin 1000mg  BID. Last HbA1c 6.7% in August 2019.   -- Lantus 10 u nightly (home dose 40u), decreased as pt is not eating much and AM glucose was low  -- SSI with POCT glucose checks  -- Hold home metformin, empagliflozin  ??  ?AKI on CKD-III: Stable. Likely pre-renal. Creatinine varies from 0.9-1.4, near baseline on admission. Closer to 0.8-0.9 on admission to [facility], up-trended to ~1.6 on admission to [facility]. Holding ACEI and diuretic. Monitoring TLS labs, not contributing currently. Patient has very poor PO intake and appears very dry on exam.   -- Avoid nephrotoxins, renally dose medications as appropriate  -- Hold ACEI and HCTZ as above  -- Encourage PO intake     Hypertension: Home regimen is HCTZ 12.5mg  daily, lisinopril 20mg  daily. Was getting metoprolol tartrate as substitute for HCTZ at OSH and diltiazem. Favored starting amlodipine over these options.   -- amlodipine 10 mg daily  -- Hold lisinopril 20mg  daily  -- Discontinue HCTZ  ??  Daily Checklist:  Diet: Regular   DVT PPx: Heparin 5000mg  q8h (holding for procedure)   GI PPx: Not Indicated  Code Status: DNR and DNI  Dispo: Admit to MDE1, Inpatient, Floor  ___________________________________________________________________    Labs/Studies:  Labs and Studies from the last 24hrs per EMR and Reviewed    Objective:  Temp:  [36.3 ??C-39.7 ??C] 36.3 ??C  Heart Rate:  [123-141] 129  Resp:  [18-28] 22  BP: (107-148)/(54-67) 123/57  SpO2:  [92 %-99 %] 95 %,     Intake/Output Summary (Last 24 hours) at 10/31/2018 1610  Last data filed at 10/31/2018 0530  Gross per 24 hour   Intake 3101 ml   Output 500 ml   Net 2601 ml       Patient Lines/Drains/Airways Status    Active Active Lines, Drains, & Airways     Name:   Placement date:   Placement time:   Site:  Days:    CVC Triple Lumen 10/29/18 Non-tunneled Right Internal jugular   10/29/18    0851    Internal jugular   2              GEN:  NAD, resting comfortably in bed, pleasant   HEENT: EOMI, mucous membranes dry. Poor dentition.   CARDIO: Tachycardic, regular rhythm, faint systolic murmur LUSB  RESPIRATORY: Normal work of breathing on RA.   SKIN: No diaphoresis, skin very dry.   MSK: Evidence of muscle wasting, particularly in thighs.  Neuro: A&O x3. CN intact. Strength 4/5 in bilateral lower extremities.  R CVAD in place    Salome Arnt, M.D.

## 2018-11-01 DIAGNOSIS — R000 Tachycardia, unspecified: Secondary | ICD-10-CM | POA: Diagnosis not present

## 2018-11-01 DIAGNOSIS — I38 Endocarditis, valve unspecified: Secondary | ICD-10-CM | POA: Diagnosis not present

## 2018-11-01 DIAGNOSIS — R410 Disorientation, unspecified: Secondary | ICD-10-CM | POA: Diagnosis not present

## 2018-11-01 DIAGNOSIS — C9300 Acute monoblastic/monocytic leukemia, not having achieved remission: Secondary | ICD-10-CM | POA: Diagnosis not present

## 2018-11-01 DIAGNOSIS — M6281 Muscle weakness (generalized): Secondary | ICD-10-CM | POA: Diagnosis not present

## 2018-11-01 LAB — COMPREHENSIVE METABOLIC PANEL
ALBUMIN: 2.1 g/dL — ABNORMAL LOW (ref 3.5–5.0)
ALKALINE PHOSPHATASE: 396 U/L — ABNORMAL HIGH (ref 38–126)
ALKALINE PHOSPHATASE: 361 U/L — ABNORMAL HIGH (ref 38–126)
ALT (SGPT): 31 U/L (ref <50)
ANION GAP: 4 mmol/L — ABNORMAL LOW (ref 7–15)
AST (SGOT): 43 U/L (ref 19–55)
AST (SGOT): 61 U/L — ABNORMAL HIGH (ref 19–55)
BILIRUBIN TOTAL: 0.4 mg/dL (ref 0.0–1.2)
BILIRUBIN TOTAL: 0.3 mg/dL (ref 0.0–1.2)
BLOOD UREA NITROGEN: 26 mg/dL — ABNORMAL HIGH (ref 7–21)
BLOOD UREA NITROGEN: 26 mg/dL — ABNORMAL HIGH (ref 7–21)
BUN / CREAT RATIO: 16
BUN / CREAT RATIO: 17
CALCIUM: 7.3 mg/dL — ABNORMAL LOW (ref 8.5–10.2)
CHLORIDE: 105 mmol/L (ref 98–107)
CHLORIDE: 100 mmol/L (ref 98–107)
CO2: 26 mmol/L (ref 22.0–30.0)
CO2: 26 mmol/L (ref 22.0–30.0)
CREATININE: 1.59 mg/dL — ABNORMAL HIGH (ref 0.70–1.30)
CREATININE: 1.51 mg/dL — ABNORMAL HIGH (ref 0.70–1.30)
EGFR CKD-EPI AA MALE: 51 mL/min/{1.73_m2} — ABNORMAL LOW (ref >=60)
EGFR CKD-EPI AA MALE: 54 mL/min/{1.73_m2} — ABNORMAL LOW (ref >=60)
EGFR CKD-EPI NON-AA MALE: 44 mL/min/{1.73_m2} — ABNORMAL LOW (ref >=60)
EGFR CKD-EPI NON-AA MALE: 47 mL/min/{1.73_m2} — ABNORMAL LOW (ref >=60)
GLUCOSE RANDOM: 139 mg/dL (ref 65–179)
GLUCOSE RANDOM: 288 mg/dL — ABNORMAL HIGH (ref 65–179)
POTASSIUM: 4 mmol/L (ref 3.5–5.0)
POTASSIUM: 4.1 mmol/L (ref 3.5–5.0)
PROTEIN TOTAL: 4.4 g/dL — ABNORMAL LOW (ref 6.5–8.3)
PROTEIN TOTAL: 4.4 g/dL — ABNORMAL LOW (ref 6.5–8.3)
SODIUM: 135 mmol/L (ref 135–145)

## 2018-11-01 LAB — APTT: HEPARIN CORRELATION: 0.2

## 2018-11-01 LAB — PROTIME-INR: INR: 1.48

## 2018-11-01 LAB — FIBRINOGEN LEVEL: Lab: 778 — ABNORMAL HIGH

## 2018-11-01 LAB — CBC W/ AUTO DIFF
BASOPHILS ABSOLUTE COUNT: 0 10*9/L (ref 0.0–0.1)
BASOPHILS RELATIVE PERCENT: 0 %
EOSINOPHILS ABSOLUTE COUNT: 0 10*9/L (ref 0.0–0.4)
EOSINOPHILS RELATIVE PERCENT: 1 %
HEMATOCRIT: 20.8 % — ABNORMAL LOW (ref 41.0–53.0)
HEMOGLOBIN: 6.7 g/dL — ABNORMAL LOW (ref 13.5–17.5)
LARGE UNSTAINED CELLS: 19 % — ABNORMAL HIGH (ref 0–4)
LYMPHOCYTES ABSOLUTE COUNT: 0.8 10*9/L — ABNORMAL LOW (ref 1.5–5.0)
MEAN CORPUSCULAR HEMOGLOBIN CONC: 32.5 g/dL (ref 31.0–37.0)
MEAN CORPUSCULAR HEMOGLOBIN: 29.9 pg (ref 26.0–34.0)
MEAN CORPUSCULAR VOLUME: 92.2 fL (ref 80.0–100.0)
MEAN PLATELET VOLUME: 11.6 fL — ABNORMAL HIGH (ref 7.0–10.0)
MONOCYTES ABSOLUTE COUNT: 0.1 10*9/L — ABNORMAL LOW (ref 0.2–0.8)
MONOCYTES RELATIVE PERCENT: 4.4 %
NEUTROPHILS ABSOLUTE COUNT: 1.6 10*9/L — ABNORMAL LOW (ref 2.0–7.5)
NEUTROPHILS RELATIVE PERCENT: 50.6 %
PLATELET COUNT: 39 10*9/L — ABNORMAL LOW (ref 150–440)
RED BLOOD CELL COUNT: 2.25 10*12/L — ABNORMAL LOW (ref 4.50–5.90)
RED CELL DISTRIBUTION WIDTH: 17.2 % — ABNORMAL HIGH (ref 12.0–15.0)
WBC ADJUSTED: 3.1 10*9/L — ABNORMAL LOW (ref 4.5–11.0)

## 2018-11-01 LAB — POTASSIUM: Potassium:SCnc:Pt:Ser/Plas:Qn:: 4

## 2018-11-01 LAB — PHOSPHORUS
Phosphate:MCnc:Pt:Ser/Plas:Qn:: 3.9
Phosphate:MCnc:Pt:Ser/Plas:Qn:: 4.5

## 2018-11-01 LAB — MEAN PLATELET VOLUME: Lab: 11.6 — ABNORMAL HIGH

## 2018-11-01 LAB — D-DIMER QUANTITATIVE (CH,ML,PD,ET): Lab: 2886 — ABNORMAL HIGH

## 2018-11-01 LAB — BLOOD UREA NITROGEN: Urea nitrogen:MCnc:Pt:Ser/Plas:Qn:: 26 — ABNORMAL HIGH

## 2018-11-01 LAB — D-DIMER, QUANTITATIVE: D-DIMER QUANTITATIVE (CH,ML,PD,ET): 2886 ng/mL — ABNORMAL HIGH

## 2018-11-01 LAB — VANCOMYCIN TROUGH: Vancomycin^trough:MCnc:Pt:Ser/Plas:Qn:: 13.6

## 2018-11-01 LAB — URIC ACID
Urate:MCnc:Pt:Ser/Plas:Qn:: 2.6 — ABNORMAL LOW
Urate:MCnc:Pt:Ser/Plas:Qn:: 3.4 — ABNORMAL LOW

## 2018-11-01 LAB — SMEAR REVIEW

## 2018-11-01 LAB — PROTIME: Lab: 17.1 — ABNORMAL HIGH

## 2018-11-01 LAB — LACTATE DEHYDROGENASE
Lactate dehydrogenase:CCnc:Pt:Ser/Plas:Qn:: 4827 — ABNORMAL HIGH
Lactate dehydrogenase:CCnc:Pt:Ser/Plas:Qn:: 6741 — ABNORMAL HIGH

## 2018-11-01 LAB — HEPARIN CORRELATION: Lab: 0.2

## 2018-11-01 NOTE — Unmapped (Signed)
MedE Progress Note    Interval Events- Patient was febrile to 38.1 at 7 pm; his Tmax and frequency of fevers have decreased since yest in setting of either/or chemo, tooth extraction. Tolerated tooth extraction well, no bleeding. Only had an ensure yesterday, didn't eat or drink anything else; he states due to poor appetite, but then ate a lot of breakfast apparently. Tachycardia seems to be improving, though not yet resolved. Will continue IV fluids. Transfused 1 unit pRBCs for Hgb 6.7. BMBX results c/w monocytic AML, 65% blasts, FLT3 negative. Continuing aza/venetoclax. LE weakness seems more asymmetric today, L>R, though suspect it is secondary to pain, which patient continues to endorse. BCx still negative, Brucella and tularensis abs negative, continuing IV vanc/ceftriaxone.      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 Kyle Huffman is a 67 y.o. male with PMHx of HTN, T2DM, CKD-III that presented to [facility] with CN aortic valve endocarditis and monocytic AML, receiving C1 of aza/venetoclax (C1D1 10/31/18).     Culture-Negative Aortic Valve Endocarditis: Patient met sepsis criteria on admission to OSH and source was felt to be multiple dental abscesses seen on CT MaxFac. Multiple sets of blood cultures are NG at 5 days, however he was taking oral amoxicillin on admission. HACEK serologies negative. TEE was performed on 11/14 that showed medium-sized (0.6cm x0.9cm) vegetation on the left ventricular aspect of the left coronary cusp; TTE here 11/25 confirmed. S/p broad spectrum abx at OSH from 11/8- 11/21. No signs of HF, blood cxs continue to be clear. DDx also includes marantic endocarditis 2/2 leukemia. Continues to fever intermittently, no clear infectious source and felt to likely be due to AML as below, but BCX and other infectious workup repeated. S/p tooth extractions 11/27.   - f/u repeat BCx 11/26; NGTD  -- IV vanc dosed per pharmacy, Ceftriaxone 2g q24h; at least 4 weeks of IV abx  -- ICID consulted, appreciate recs; consider vertebral bx (holding off given diffuse bony uptake on PET/CT, unable to localize a potential infectious source)   ??  Monocytic AML: Preliminary pathology from BMBx at [facility] initially c/f blastic plasmacytoid dendritic neoplasm with a DDx that also includes CD56+ AML. BMBx results here 11/22 c/w monocytic AML with 65% blasts, cytogenetics reveal a tri8 and a ???jumping??? 3q chromosome. FLT3 negative. No skin lesions on exam. PET/CT 11/26 with diffuse uptake but limited to bone marrow. Started chemo 11/27.  -- Allopurinol 300 mg daily   - Aza/venetoclax C1D1 11/27  - TLS/DIC labs BID   -- Transfusion Thresholds: Hgb <7, PLT <10, fibrinogen <150  - calorie count given poor PO and nutritional status   - amicar swishes Q8H to prevent mucosal bleeding post tooth extraction, given low plt    Sinus Tachycardia: Improving. Patient presented with intermittent sinus tachycardia to 110s. 11/25 HR increased to 120s-130s, and has been stably so. All other VSS, EKG with sinus tach. Intermittently febrile but no other signs/sx  of infection (see above). No new O2 requirement. Non-toxic appearing on exam. Likely due to his underlying disease process, also in setting of extremely poor PO intake, dry on exam. Improving with chemo, IV fluids.   - CTM  - maintenance fluids   ??  Bilateral Lower Extremity Weakness: Stable. MRI T/L Spine done at [facility] showed neural foraminal narrowing at L3-4 and L5-S1 (moderate on the right side). Also showed diffusely abnormal bone marrow signal in the spine concerning for infiltrative tumor. No canal narrowing. Strength seems relatively symmetric, with mild diffuse weakness, though patient able to ambulate. Question is whether this could be due to CNS component of his leukemia vs deconditioning in the setting of illness.   -- PT/OT  -- LP attempted 11/24, patient did not tolerate presumably due to discomfort with positioning  - ICID consulted, appreciate recs; consider vertebral biopsy for poss infectious source, however PET demonstrated diffuse bony disease, thus weakness unlikely to be due to localized infection and more likely due to disease burden     Delirium: Improved. Patient intermittently A&Ox3, but at night was getting agitated and combative when care attempted. Patient notably has underlying brain injury, previous strokes. This in combo with current illnesses make him high risk for recurrent delirium.  - delirium precautions   - zyprexa 5 mg QPM  ??  T2DM: Home regimen is Lantus 42u nightly, empagliflozin 10mg  daily, and metformin 1000mg  BID. Last HbA1c 6.7% in August 2019.   -- Lantus 10 u nightly (home dose 40u), decreased as pt is not eating much and AM glucose was low  -- SSI with POCT glucose checks  -- Hold home metformin, empagliflozin  ??  ?AKI on CKD-III: Stable. Likely pre-renal. Creatinine varies from 0.9-1.4, near baseline on admission. Closer to 0.8-0.9 on admission to [facility], up-trended to ~1.6 on admission to [facility]. Holding ACEI and diuretic. Monitoring TLS labs, not contributing currently. Patient has very poor PO intake and appears dry on exam.   -- Avoid nephrotoxins, renally dose medications as appropriate  -- Hold ACEI and HCTZ   -- Encourage PO intake   - maintenance IV fluids     Hypertension: Home regimen is HCTZ 12.5mg  daily, lisinopril 20mg  daily. Was getting metoprolol tartrate as substitute for HCTZ at OSH and diltiazem. Favored starting amlodipine over these options.   -- amlodipine 10 mg daily  -- Hold lisinopril and HCTZ as above  ??  Daily Checklist:  Diet: Regular   DVT PPx: not indicated due to thrombocytopenia   GI PPx: Not Indicated  Code Status: DNR and DNI  Dispo: MDE1, Inpatient, Floor  ___________________________________________________________________    Labs/Studies:  Labs and Studies from the last 24hrs per EMR and Reviewed    Objective:  Temp:  [35.9 ??C-38.1 ??C] 37.3 ??C  Heart Rate:  [109-127] 109  Resp:  [20-22] 20  BP: (128-176)/(60-75) 128/64  SpO2:  [92 %-97 %] 95 %,     Intake/Output Summary (Last 24 hours) at 11/01/2018 0946  Last data filed at 11/01/2018 2952  Gross per 24 hour   Intake 3719 ml   Output 825 ml   Net 2894 ml       Patient Lines/Drains/Airways Status    Active Active Lines, Drains, & Airways     Name:   Placement date:   Placement time:   Site:   Days:    CVC Triple Lumen 10/29/18 Non-tunneled Right Internal jugular   10/29/18    0851    Internal jugular  3              GEN:  NAD, resting comfortably in bed, pleasant   HEENT: EOMI, mucous membranes dry. No active bleeding in mouth  CARDIO: Tachycardic, regular rhythm, faint systolic murmur LUSB  RESPIRATORY: Normal work of breathing on RA.   SKIN: No diaphoresis, skin very dry.   MSK: Evidence of muscle wasting, particularly in thighs.  Neuro: A&O x3. CN intact. Strength 5/5 in RLE. 3/5 in LLE.   R CVAD in place    Salome Arnt, M.D.

## 2018-11-01 NOTE — Unmapped (Signed)
Pt A/Ox2-3, AMS & agitation better overnight. Sitter remains at bedside. Febrile x1 during shift, improved with tylenol, pt continues antibiotics, pt not due for cultures. Remains tachycardic, but asymptomatic. Other VS stable. No c/o pain overnight. No s/s of DVT. No falls/injuries during shift. No s/s of skin injuries. Continues MIVF overnight. Continues to report decrease in appetite. S/p tooth extraction, no s/s of bleeding overnight, continues amicar mouthwash. Will be Venetoclax/AZA D2 today. WCTM.   Problem: Adult Inpatient Plan of Care  Goal: Plan of Care Review  Outcome: Ongoing - Unchanged  Goal: Patient-Specific Goal (Individualization)  Outcome: Ongoing - Unchanged  Goal: Absence of Hospital-Acquired Illness or Injury  Outcome: Ongoing - Unchanged  Goal: Optimal Comfort and Wellbeing  Outcome: Ongoing - Unchanged  Goal: Readiness for Transition of Care  Outcome: Ongoing - Unchanged  Goal: Rounds/Family Conference  Outcome: Ongoing - Unchanged     Problem: Fall Injury Risk  Goal: Absence of Fall and Fall-Related Injury  Outcome: Ongoing - Unchanged     Problem: Self-Care Deficit  Goal: Improved Ability to Complete Activities of Daily Living  Outcome: Ongoing - Unchanged     Problem: Asthma Comorbidity  Goal: Maintenance of Asthma Control  Outcome: Ongoing - Unchanged     Problem: COPD Comorbidity  Goal: Maintenance of COPD Symptom Control  Outcome: Ongoing - Unchanged     Problem: Diabetes Comorbidity  Goal: Blood Glucose Level Within Desired Range  Outcome: Ongoing - Unchanged     Problem: Heart Failure Comorbidity  Goal: Maintenance of Heart Failure Symptom Control  Outcome: Ongoing - Unchanged     Problem: Hypertension Comorbidity  Goal: Blood Pressure in Desired Range  Outcome: Ongoing - Unchanged     Problem: Obstructive Sleep Apnea Risk or Actual (Comorbidity Management)  Goal: Unobstructed Breathing During Sleep  Outcome: Ongoing - Unchanged     Problem: Pain Chronic (Persistent) (Comorbidity Management)  Goal: Acceptable Pain Control and Functional Ability  Outcome: Ongoing - Unchanged     Problem: Seizure Disorder Comorbidity  Goal: Maintenance of Seizure Control  Outcome: Ongoing - Unchanged     Problem: Skin Injury Risk Increased  Goal: Skin Health and Integrity  Outcome: Ongoing - Unchanged     Problem: Anemia (Chemotherapy Effects)  Goal: Anemia Symptom Improvement  Outcome: Ongoing - Unchanged     Problem: Urinary Bleeding Risk or Actual (Chemotherapy Effects)  Goal: Absence of Hematuria  Outcome: Ongoing - Unchanged     Problem: Nausea and Vomiting (Chemotherapy Effects)  Goal: Fluid and Electrolyte Balance  Outcome: Ongoing - Unchanged     Problem: Neurotoxicity (Chemotherapy Effects)  Goal: Neurotoxicity Symptom Control  Outcome: Ongoing - Unchanged     Problem: Neutropenia (Chemotherapy Effects)  Goal: Absence of Infection  Outcome: Ongoing - Unchanged     Problem: Oral Mucositis (Chemotherapy Effects)  Goal: Improved Oral Mucous Membrane Integrity  Outcome: Ongoing - Unchanged     Problem: Thrombocytopenia Bleeding Risk (Chemotherapy Effects)  Goal: Absence of Bleeding  Outcome: Ongoing - Unchanged

## 2018-11-01 NOTE — Unmapped (Signed)
Pt remains free from falls/injury.  Pt remained afebrile throughout shift.  Pt remains tachycardic.  Pt was more alert today.  Pt was still disoriented to time.  Sitter was discontinued.  Bed alarm activated and delirium protocol followed.  Pt's brother was at bedside until afternoon.  Pt had several teeth pulled this am.  Pt reported no pain from procedure.  Pt did not want to eat food today, but did drink an Ensure.  Pt started venetoclax/azacitidine today without issue.  Pt worked with PT and was able to stand/ambulate with minimal assistance.  Pt denies needs at this time.  Will continue to monitor.

## 2018-11-01 NOTE — Unmapped (Signed)
Vancomycin Therapeutic Monitoring Pharmacy Note    Kyle Huffman is a 67 y.o. male starting vancomycin. Date of therapy restart: 11/12 from OSH (per notes, also received from 11/8-11/10)    Indication: Endocarditis    Prior Dosing Information: Current regimen 1 g IV q24h      Goals:  Therapeutic Drug Levels  Vancomycin trough goal: 15-20 mg/L    Additional Clinical Monitoring/Outcomes  Renal function, volume status (intake and output)    Results: Vancomycin level 13.6 mg/L (~20 hours after last dose).       Wt Readings from Last 1 Encounters:   10/31/18 67.2 kg (148 lb 2.4 oz)     Creatinine   Date Value Ref Range Status   11/01/2018 1.59 (H) 0.70 - 1.30 mg/dL Final   53/66/4403 4.74 (H) 0.70 - 1.30 mg/dL Final   25/95/6387 5.64 (H) 0.70 - 1.30 mg/dL Final        Pharmacokinetic Considerations and Significant Drug Interactions:  ? Adult (calculated on 11/22): Vd = 46 L, ke = 0.0406 hr-1  ? Concurrent nephrotoxic meds: not applicable    Assessment/Plan:  Recommendation(s)  ? Change current regimen to vancomycin 1250 mg IV every 24 hours  ? Estimated trough on recommended regimen: 16 mg/L    Follow-up  ? Level due: in 3-5 days.   ? A pharmacist will continue to monitor and order levels as appropriate    Please page service pharmacist with questions/clarifications.    Ronnald Collum, PharmD  PGY2 Hematology/Oncology Pharmacy Resident  Pager [phone]

## 2018-11-01 NOTE — Unmapped (Signed)
1 unit of PRBC transfused with no reaction noted. IVF and IV abx as ordered. Pt's brother by the bedside. ACHS. WCTM.  Problem: Adult Inpatient Plan of Care  Goal: Plan of Care Review  Outcome: Progressing  Goal: Patient-Specific Goal (Individualization)  Outcome: Progressing  Goal: Absence of Hospital-Acquired Illness or Injury  Outcome: Progressing  Goal: Optimal Comfort and Wellbeing  Outcome: Progressing  Goal: Readiness for Transition of Care  Outcome: Progressing  Goal: Rounds/Family Conference  Outcome: Progressing     Problem: Fall Injury Risk  Goal: Absence of Fall and Fall-Related Injury  Outcome: Progressing     Problem: Self-Care Deficit  Goal: Improved Ability to Complete Activities of Daily Living  Outcome: Progressing     Problem: Asthma Comorbidity  Goal: Maintenance of Asthma Control  Outcome: Progressing     Problem: COPD Comorbidity  Goal: Maintenance of COPD Symptom Control  Outcome: Progressing     Problem: Diabetes Comorbidity  Goal: Blood Glucose Level Within Desired Range  Outcome: Progressing     Problem: Heart Failure Comorbidity  Goal: Maintenance of Heart Failure Symptom Control  Outcome: Progressing     Problem: Hypertension Comorbidity  Goal: Blood Pressure in Desired Range  Outcome: Progressing     Problem: Obstructive Sleep Apnea Risk or Actual (Comorbidity Management)  Goal: Unobstructed Breathing During Sleep  Outcome: Progressing     Problem: Pain Chronic (Persistent) (Comorbidity Management)  Goal: Acceptable Pain Control and Functional Ability  Outcome: Progressing     Problem: Seizure Disorder Comorbidity  Goal: Maintenance of Seizure Control  Outcome: Progressing     Problem: Skin Injury Risk Increased  Goal: Skin Health and Integrity  Outcome: Progressing     Problem: Anemia (Chemotherapy Effects)  Goal: Anemia Symptom Improvement  Outcome: Progressing     Problem: Urinary Bleeding Risk or Actual (Chemotherapy Effects)  Goal: Absence of Hematuria  Outcome: Progressing Problem: Nausea and Vomiting (Chemotherapy Effects)  Goal: Fluid and Electrolyte Balance  Outcome: Progressing     Problem: Neurotoxicity (Chemotherapy Effects)  Goal: Neurotoxicity Symptom Control  Outcome: Progressing     Problem: Neutropenia (Chemotherapy Effects)  Goal: Absence of Infection  Outcome: Progressing     Problem: Oral Mucositis (Chemotherapy Effects)  Goal: Improved Oral Mucous Membrane Integrity  Outcome: Progressing     Problem: Thrombocytopenia Bleeding Risk (Chemotherapy Effects)  Goal: Absence of Bleeding  Outcome: Progressing

## 2018-11-02 DIAGNOSIS — E119 Type 2 diabetes mellitus without complications: Secondary | ICD-10-CM | POA: Diagnosis not present

## 2018-11-02 DIAGNOSIS — I38 Endocarditis, valve unspecified: Secondary | ICD-10-CM | POA: Diagnosis not present

## 2018-11-02 DIAGNOSIS — C9300 Acute monoblastic/monocytic leukemia, not having achieved remission: Secondary | ICD-10-CM | POA: Diagnosis not present

## 2018-11-02 DIAGNOSIS — R000 Tachycardia, unspecified: Secondary | ICD-10-CM | POA: Diagnosis not present

## 2018-11-02 DIAGNOSIS — M6281 Muscle weakness (generalized): Secondary | ICD-10-CM | POA: Diagnosis not present

## 2018-11-02 LAB — COMPREHENSIVE METABOLIC PANEL
ALBUMIN: 2.1 g/dL — ABNORMAL LOW (ref 3.5–5.0)
ALBUMIN: 2.1 g/dL — ABNORMAL LOW (ref 3.5–5.0)
ALKALINE PHOSPHATASE: 343 U/L — ABNORMAL HIGH (ref 38–126)
ALKALINE PHOSPHATASE: 389 U/L — ABNORMAL HIGH (ref 38–126)
ALT (SGPT): 31 U/L (ref <50)
ALT (SGPT): 52 U/L — ABNORMAL HIGH (ref <50)
ANION GAP: 3 mmol/L — ABNORMAL LOW (ref 7–15)
AST (SGOT): 48 U/L (ref 19–55)
AST (SGOT): 90 U/L — ABNORMAL HIGH (ref 19–55)
BILIRUBIN TOTAL: 0.4 mg/dL (ref 0.0–1.2)
BILIRUBIN TOTAL: 0.3 mg/dL (ref 0.0–1.2)
BLOOD UREA NITROGEN: 24 mg/dL — ABNORMAL HIGH (ref 7–21)
BLOOD UREA NITROGEN: 23 mg/dL — ABNORMAL HIGH (ref 7–21)
BUN / CREAT RATIO: 16
BUN / CREAT RATIO: 16
CALCIUM: 7.7 mg/dL — ABNORMAL LOW (ref 8.5–10.2)
CHLORIDE: 104 mmol/L (ref 98–107)
CO2: 29 mmol/L (ref 22.0–30.0)
CO2: 28 mmol/L (ref 22.0–30.0)
CREATININE: 1.53 mg/dL — ABNORMAL HIGH (ref 0.70–1.30)
CREATININE: 1.47 mg/dL — ABNORMAL HIGH (ref 0.70–1.30)
EGFR CKD-EPI AA MALE: 53 mL/min/{1.73_m2} — ABNORMAL LOW (ref >=60)
EGFR CKD-EPI AA MALE: 56 mL/min/{1.73_m2} — ABNORMAL LOW (ref >=60)
EGFR CKD-EPI NON-AA MALE: 46 mL/min/{1.73_m2} — ABNORMAL LOW (ref >=60)
EGFR CKD-EPI NON-AA MALE: 48 mL/min/{1.73_m2} — ABNORMAL LOW (ref >=60)
GLUCOSE RANDOM: 136 mg/dL (ref 65–179)
GLUCOSE RANDOM: 178 mg/dL (ref 65–179)
POTASSIUM: 4 mmol/L (ref 3.5–5.0)
POTASSIUM: 4 mmol/L (ref 3.5–5.0)
PROTEIN TOTAL: 4.3 g/dL — ABNORMAL LOW (ref 6.5–8.3)
PROTEIN TOTAL: 4.4 g/dL — ABNORMAL LOW (ref 6.5–8.3)
SODIUM: 136 mmol/L (ref 135–145)

## 2018-11-02 LAB — CBC W/ AUTO DIFF
BASOPHILS ABSOLUTE COUNT: 0 10*9/L (ref 0.0–0.1)
BASOPHILS RELATIVE PERCENT: 1 %
EOSINOPHILS ABSOLUTE COUNT: 0 10*9/L (ref 0.0–0.4)
EOSINOPHILS RELATIVE PERCENT: 1.4 %
HEMATOCRIT: 24.5 % — ABNORMAL LOW (ref 41.0–53.0)
HEMOGLOBIN: 8 g/dL — ABNORMAL LOW (ref 13.5–17.5)
LARGE UNSTAINED CELLS: 9 % — ABNORMAL HIGH (ref 0–4)
LYMPHOCYTES ABSOLUTE COUNT: 0.6 10*9/L — ABNORMAL LOW (ref 1.5–5.0)
LYMPHOCYTES RELATIVE PERCENT: 30.2 %
MEAN CORPUSCULAR HEMOGLOBIN CONC: 32.7 g/dL (ref 31.0–37.0)
MEAN CORPUSCULAR HEMOGLOBIN: 29.7 pg (ref 26.0–34.0)
MEAN CORPUSCULAR VOLUME: 90.9 fL (ref 80.0–100.0)
MEAN PLATELET VOLUME: 11.7 fL — ABNORMAL HIGH (ref 7.0–10.0)
MONOCYTES ABSOLUTE COUNT: 0.1 10*9/L — ABNORMAL LOW (ref 0.2–0.8)
MONOCYTES RELATIVE PERCENT: 4.1 %
NEUTROPHILS ABSOLUTE COUNT: 1.1 10*9/L — ABNORMAL LOW (ref 2.0–7.5)
NEUTROPHILS RELATIVE PERCENT: 54.2 %
NUCLEATED RED BLOOD CELLS: 2 /100{WBCs} (ref <=4)
PLATELET COUNT: 37 10*9/L — ABNORMAL LOW (ref 150–440)
RED BLOOD CELL COUNT: 2.7 10*12/L — ABNORMAL LOW (ref 4.50–5.90)
RED CELL DISTRIBUTION WIDTH: 16.6 % — ABNORMAL HIGH (ref 12.0–15.0)
WBC ADJUSTED: 2.1 10*9/L — ABNORMAL LOW (ref 4.5–11.0)

## 2018-11-02 LAB — PHOSPHORUS
Phosphate:MCnc:Pt:Ser/Plas:Qn:: 3.6
Phosphate:MCnc:Pt:Ser/Plas:Qn:: 3.9

## 2018-11-02 LAB — URIC ACID
Urate:MCnc:Pt:Ser/Plas:Qn:: 2.3 — ABNORMAL LOW
Urate:MCnc:Pt:Ser/Plas:Qn:: 2.6 — ABNORMAL LOW

## 2018-11-02 LAB — APTT
APTT: 27.9 s (ref 25.9–39.5)
Coagulation surface induced:Time:Pt:PPP:Qn:Coag: 27.9

## 2018-11-02 LAB — BUN / CREAT RATIO: Urea nitrogen/Creatinine:MRto:Pt:Ser/Plas:Qn:: 16

## 2018-11-02 LAB — LACTATE DEHYDROGENASE
Lactate dehydrogenase:CCnc:Pt:Ser/Plas:Qn:: 3794 — ABNORMAL HIGH
Lactate dehydrogenase:CCnc:Pt:Ser/Plas:Qn:: 4145 — ABNORMAL HIGH

## 2018-11-02 LAB — SLIDE REVIEW

## 2018-11-02 LAB — D-DIMER QUANTITATIVE (CH,ML,PD,ET): Lab: 2925 — ABNORMAL HIGH

## 2018-11-02 LAB — PROTIME: Lab: 14.9 — ABNORMAL HIGH

## 2018-11-02 LAB — FIBRINOGEN LEVEL: Lab: 837 — ABNORMAL HIGH

## 2018-11-02 LAB — PROTIME-INR: PROTIME: 14.9 s — ABNORMAL HIGH (ref 10.2–13.1)

## 2018-11-02 LAB — ANION GAP: Anion gap 3:SCnc:Pt:Ser/Plas:Qn:: 3 — ABNORMAL LOW

## 2018-11-02 LAB — VITAMIN D, TOTAL (25OH): Lab: 22.3

## 2018-11-02 LAB — MONOCYTES RELATIVE PERCENT: Lab: 4.1

## 2018-11-02 LAB — SMEAR REVIEW

## 2018-11-02 NOTE — Unmapped (Signed)
MedE Progress Note    Interval Events- NAEON. Afebrile for 24 hours. Brother reports significantly decreased confusion and states that patient was much more alert yesterday. Patient reports feeling improved as well. Leg pain still present but improved since admission; stably slightly weak on exam but was able to walk to bathroom with PT yesterday. Encouraged OOB to chair. States mouth a little sore s/p tooth extractions, no bleeding. Still not great appetite. Continue with IV abx and with chemotherapy as planned.      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 Kyle Huffman is a 67 y.o. male with PMHx of HTN, T2DM, CKD-III who presented to [facility] with CN aortic valve endocarditis and monocytic AML, receiving C1 of aza/venetoclax (C1D1 10/31/18).     Culture-Negative Aortic Valve Endocarditis: Patient met sepsis criteria on admission to OSH and source was felt to be multiple dental abscesses seen on CT MaxFac. Multiple sets of blood cultures are NG at 5 days, however he was taking oral amoxicillin on admission. HACEK serologies negative. TEE was performed on 11/14 that showed medium-sized (0.6cm x0.9cm) vegetation on the left ventricular aspect of the left coronary cusp; TTE here 11/25 confirmed. S/p broad spectrum abx at OSH from 11/8- 11/21. No signs of HF, blood cxs continue to be clear. DDx also includes marantic endocarditis 2/2 leukemia. Was fevering intermittently on presentation, now resolved s/p tooth extractions and chemo initiation 11/27.   - f/u repeat BCx 11/26; NGTD  -- IV vanc dosed per pharmacy, Ceftriaxone 2g q24h; at least 4 weeks of IV abx  -- ICID consulted, appreciate recs; consider vertebral bx (holding off given diffuse bony uptake on PET/CT, unable to localize a potential infectious source)   ??  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 3q chromosome. FLT3 negative. PET/CT 11/26 with diffuse uptake but limited to bone marrow. Started chemo 11/27. TLS/DIC labs improved.  -- Allopurinol 300 mg daily   - Aza/venetoclax C1D1 11/27  - TLS/DIC labs BID   -- Transfusion Thresholds: Hgb <7, PLT <10, fibrinogen <150  - calorie count given poor PO and nutritional status   - amicar swishes Q8H to prevent mucosal bleeding post tooth extraction, given low plt    Sinus Tachycardia: Improved. Patient presented with intermittent sinus tachycardia to 110s. 11/25 HR increased to 120s-140s. All other VSS, EKG with sinus tach. Intermittently febrile but no other signs/sx of infection (see above). No new O2 requirement. Non-toxic appearing on exam. Likely due to his underlying disease process, also in setting of extremely poor PO intake, dry on exam. Improved with chemo, IV fluids.   - CTM  ??  Bilateral Lower Extremity Weakness: Stable. MRI T/L Spine done at [facility] showed neural foraminal narrowing at L3-4 and L5-S1 (moderate on the right side). Also showed diffusely abnormal bone marrow signal in the spine concerning for infiltrative tumor. No canal narrowing. Strength seems  relatively symmetric, with mild diffuse weakness, though patient able to ambulate. Question is whether this could be due to CNS component of his leukemia vs deconditioning in the setting of illness.   -- PT/OT  -- LP attempted 11/24, patient did not tolerate presumably due to discomfort with positioning  - ICID consulted, appreciate recs; consider vertebral biopsy for poss infectious source, however PET demonstrated diffuse bony disease, thus weakness unlikely to be due to localized infection and more likely due to disease burden     Delirium: Resolved. Patient intermittently A&Ox3, but at night was getting agitated and combative when care attempted. Patient notably has underlying brain injury, previous strokes. This in combo with current illnesses make him high risk for recurrent delirium.  - delirium precautions   - zyprexa 5 mg QPM  ??  T2DM: Home regimen is Lantus 42u nightly, empagliflozin 10mg  daily, and metformin 1000mg  BID. Last HbA1c 6.7% in August 2019.   -- Lantus 10 u nightly (home dose 40u), decreased as pt is not eating much and AM glucose was low  -- SSI with POCT glucose checks  -- Hold home metformin, empagliflozin  ??  ?AKI on CKD-III: Stable, could be patient's new baseline. Likely pre-renal. Creatinine varies from 0.9-1.4, near baseline on admission. Closer to 0.8-0.9 on admission to [facility], up-trended to ~1.6 on admission to [facility]. Holding ACEI and diuretic. Monitoring TLS labs, not contributing currently. Patient has very poor PO intake and appears dry on exam.   -- Avoid nephrotoxins, renally dose medications as appropriate  -- Hold ACEI and HCTZ   -- Encourage PO intake      Hypertension: Home regimen is HCTZ 12.5mg  daily, lisinopril 20mg  daily. Was getting metoprolol tartrate as substitute for HCTZ at OSH and diltiazem. Favored starting amlodipine over these options.   -- amlodipine 10 mg daily  -- Hold lisinopril and HCTZ as above  ??  Daily Checklist:  Diet: Regular   DVT PPx: not indicated due to thrombocytopenia   GI PPx: Not Indicated  Code Status: DNR and DNI  Dispo: MDE1, Inpatient, Floor  ___________________________________________________________________    Labs/Studies:  Labs and Studies from the last 24hrs per EMR and Reviewed    Objective:  Temp:  [36.2 ??C-37.7 ??C] 36.4 ??C  Heart Rate:  [107-115] 114  Resp:  [16-20] 18  BP: (129-154)/(62-71) 154/71  SpO2:  [92 %-95 %] 92 %,     Intake/Output Summary (Last 24 hours) at 11/02/2018 0847  Last data filed at 11/02/2018 0820  Gross per 24 hour   Intake 2211 ml   Output 1750 ml   Net 461 ml       Patient Lines/Drains/Airways Status    Active Active Lines, Drains, & Airways     Name:   Placement date:   Placement time:   Site:   Days:    CVC Triple Lumen 10/29/18 Non-tunneled Right Internal jugular   10/29/18    0851    Internal jugular   3              GEN:  NAD, resting comfortably in bed, pleasant   HEENT: EOMI, mucous membranes moist. No active bleeding in mouth  CARDIO: Tachycardic, regular rhythm, faint systolic murmur LUSB  RESPIRATORY: Normal work of breathing on RA.   SKIN: No diaphoresis, skin dry.   MSK: Evidence of muscle wasting, particularly in thighs.  Neuro: A&O x3. CN intact. Strength 4/5 in RLE. 3+/5 in LLE.   R CVAD in place  Salome Arnt, M.D.

## 2018-11-02 NOTE — Unmapped (Signed)
Patient alert and oriented, VSS. No acute events overnight, wctm.       Problem: Adult Inpatient Plan of Care  Goal: Plan of Care Review  Outcome: Ongoing - Unchanged  Goal: Patient-Specific Goal (Individualization)  Outcome: Ongoing - Unchanged  Goal: Absence of Hospital-Acquired Illness or Injury  Outcome: Ongoing - Unchanged  Goal: Optimal Comfort and Wellbeing  Outcome: Ongoing - Unchanged  Goal: Readiness for Transition of Care  Outcome: Ongoing - Unchanged  Goal: Rounds/Family Conference  Outcome: Ongoing - Unchanged     Problem: Fall Injury Risk  Goal: Absence of Fall and Fall-Related Injury  Outcome: Ongoing - Unchanged     Problem: Self-Care Deficit  Goal: Improved Ability to Complete Activities of Daily Living  Outcome: Ongoing - Unchanged     Problem: Asthma Comorbidity  Goal: Maintenance of Asthma Control  Outcome: Ongoing - Unchanged     Problem: COPD Comorbidity  Goal: Maintenance of COPD Symptom Control  Outcome: Ongoing - Unchanged     Problem: Diabetes Comorbidity  Goal: Blood Glucose Level Within Desired Range  Outcome: Ongoing - Unchanged     Problem: Heart Failure Comorbidity  Goal: Maintenance of Heart Failure Symptom Control  Outcome: Ongoing - Unchanged     Problem: Hypertension Comorbidity  Goal: Blood Pressure in Desired Range  Outcome: Ongoing - Unchanged     Problem: Obstructive Sleep Apnea Risk or Actual (Comorbidity Management)  Goal: Unobstructed Breathing During Sleep  Outcome: Ongoing - Unchanged     Problem: Pain Chronic (Persistent) (Comorbidity Management)  Goal: Acceptable Pain Control and Functional Ability  Outcome: Ongoing - Unchanged     Problem: Seizure Disorder Comorbidity  Goal: Maintenance of Seizure Control  Outcome: Ongoing - Unchanged     Problem: Skin Injury Risk Increased  Goal: Skin Health and Integrity  Outcome: Ongoing - Unchanged     Problem: Anemia (Chemotherapy Effects)  Goal: Anemia Symptom Improvement  Outcome: Ongoing - Unchanged     Problem: Urinary Bleeding Risk or Actual (Chemotherapy Effects)  Goal: Absence of Hematuria  Outcome: Ongoing - Unchanged     Problem: Nausea and Vomiting (Chemotherapy Effects)  Goal: Fluid and Electrolyte Balance  Outcome: Ongoing - Unchanged     Problem: Neurotoxicity (Chemotherapy Effects)  Goal: Neurotoxicity Symptom Control  Outcome: Ongoing - Unchanged     Problem: Neutropenia (Chemotherapy Effects)  Goal: Absence of Infection  Outcome: Ongoing - Unchanged     Problem: Oral Mucositis (Chemotherapy Effects)  Goal: Improved Oral Mucous Membrane Integrity  Outcome: Ongoing - Unchanged     Problem: Thrombocytopenia Bleeding Risk (Chemotherapy Effects)  Goal: Absence of Bleeding  Outcome: Ongoing - Unchanged

## 2018-11-03 DIAGNOSIS — I38 Endocarditis, valve unspecified: Secondary | ICD-10-CM | POA: Diagnosis not present

## 2018-11-03 DIAGNOSIS — C9300 Acute monoblastic/monocytic leukemia, not having achieved remission: Secondary | ICD-10-CM | POA: Diagnosis not present

## 2018-11-03 DIAGNOSIS — E119 Type 2 diabetes mellitus without complications: Secondary | ICD-10-CM | POA: Diagnosis not present

## 2018-11-03 DIAGNOSIS — R531 Weakness: Secondary | ICD-10-CM | POA: Diagnosis not present

## 2018-11-03 LAB — COMPREHENSIVE METABOLIC PANEL
ALBUMIN: 2.2 g/dL — ABNORMAL LOW (ref 3.5–5.0)
ALBUMIN: 2 g/dL — ABNORMAL LOW (ref 3.5–5.0)
ALKALINE PHOSPHATASE: 426 U/L — ABNORMAL HIGH (ref 38–126)
ALT (SGPT): 50 U/L — ABNORMAL HIGH (ref <50)
ANION GAP: 4 mmol/L — ABNORMAL LOW (ref 7–15)
AST (SGOT): 60 U/L — ABNORMAL HIGH (ref 19–55)
AST (SGOT): 43 U/L (ref 19–55)
BILIRUBIN TOTAL: 0.5 mg/dL (ref 0.0–1.2)
BILIRUBIN TOTAL: 0.4 mg/dL (ref 0.0–1.2)
BLOOD UREA NITROGEN: 22 mg/dL — ABNORMAL HIGH (ref 7–21)
BLOOD UREA NITROGEN: 23 mg/dL — ABNORMAL HIGH (ref 7–21)
BUN / CREAT RATIO: 14
BUN / CREAT RATIO: 16
CALCIUM: 7.7 mg/dL — ABNORMAL LOW (ref 8.5–10.2)
CALCIUM: 7.3 mg/dL — ABNORMAL LOW (ref 8.5–10.2)
CHLORIDE: 104 mmol/L (ref 98–107)
CHLORIDE: 103 mmol/L (ref 98–107)
CO2: 26 mmol/L (ref 22.0–30.0)
CO2: 28 mmol/L (ref 22.0–30.0)
CREATININE: 1.54 mg/dL — ABNORMAL HIGH (ref 0.70–1.30)
CREATININE: 1.42 mg/dL — ABNORMAL HIGH (ref 0.70–1.30)
EGFR CKD-EPI AA MALE: 58 mL/min/{1.73_m2} — ABNORMAL LOW (ref >=60)
EGFR CKD-EPI NON-AA MALE: 46 mL/min/{1.73_m2} — ABNORMAL LOW (ref >=60)
EGFR CKD-EPI NON-AA MALE: 50 mL/min/{1.73_m2} — ABNORMAL LOW (ref >=60)
GLUCOSE RANDOM: 150 mg/dL (ref 65–179)
GLUCOSE RANDOM: 284 mg/dL — ABNORMAL HIGH (ref 65–179)
POTASSIUM: 3.9 mmol/L (ref 3.5–5.0)
POTASSIUM: 4 mmol/L (ref 3.5–5.0)
PROTEIN TOTAL: 4.6 g/dL — ABNORMAL LOW (ref 6.5–8.3)
PROTEIN TOTAL: 4.4 g/dL — ABNORMAL LOW (ref 6.5–8.3)
SODIUM: 136 mmol/L (ref 135–145)
SODIUM: 135 mmol/L (ref 135–145)

## 2018-11-03 LAB — URIC ACID
Urate:MCnc:Pt:Ser/Plas:Qn:: 1.8 — ABNORMAL LOW
Urate:MCnc:Pt:Ser/Plas:Qn:: 2 — ABNORMAL LOW
Urate:MCnc:Pt:Ser/Plas:Qn:: 2.5 — ABNORMAL LOW

## 2018-11-03 LAB — LACTATE DEHYDROGENASE
Lactate dehydrogenase:CCnc:Pt:Ser/Plas:Qn:: 2272 — ABNORMAL HIGH
Lactate dehydrogenase:CCnc:Pt:Ser/Plas:Qn:: 2520 — ABNORMAL HIGH
Lactate dehydrogenase:CCnc:Pt:Ser/Plas:Qn:: 3290 — ABNORMAL HIGH

## 2018-11-03 LAB — CBC W/ AUTO DIFF
BASOPHILS ABSOLUTE COUNT: 0 10*9/L (ref 0.0–0.1)
BASOPHILS RELATIVE PERCENT: 1.2 %
EOSINOPHILS ABSOLUTE COUNT: 0 10*9/L (ref 0.0–0.4)
EOSINOPHILS RELATIVE PERCENT: 1.1 %
HEMATOCRIT: 25.2 % — ABNORMAL LOW (ref 41.0–53.0)
HEMOGLOBIN: 8.4 g/dL — ABNORMAL LOW (ref 13.5–17.5)
LARGE UNSTAINED CELLS: 6 % — ABNORMAL HIGH (ref 0–4)
MEAN CORPUSCULAR HEMOGLOBIN CONC: 33.1 g/dL (ref 31.0–37.0)
MEAN CORPUSCULAR HEMOGLOBIN: 30.3 pg (ref 26.0–34.0)
MEAN CORPUSCULAR VOLUME: 91.4 fL (ref 80.0–100.0)
MEAN PLATELET VOLUME: 10.3 fL — ABNORMAL HIGH (ref 7.0–10.0)
MONOCYTES ABSOLUTE COUNT: 0.1 10*9/L — ABNORMAL LOW (ref 0.2–0.8)
MONOCYTES RELATIVE PERCENT: 2.7 %
NEUTROPHILS ABSOLUTE COUNT: 1.3 10*9/L — ABNORMAL LOW (ref 2.0–7.5)
NEUTROPHILS RELATIVE PERCENT: 63.1 %
NUCLEATED RED BLOOD CELLS: 1 /100{WBCs} (ref <=4)
RED BLOOD CELL COUNT: 2.76 10*12/L — ABNORMAL LOW (ref 4.50–5.90)
RED CELL DISTRIBUTION WIDTH: 16.4 % — ABNORMAL HIGH (ref 12.0–15.0)
WBC ADJUSTED: 2 10*9/L — ABNORMAL LOW (ref 4.5–11.0)

## 2018-11-03 LAB — CREATININE: Creatinine:MCnc:Pt:Ser/Plas:Qn:: 1.54 — ABNORMAL HIGH

## 2018-11-03 LAB — PHOSPHORUS
Phosphate:MCnc:Pt:Ser/Plas:Qn:: 3.3
Phosphate:MCnc:Pt:Ser/Plas:Qn:: 3.4
Phosphate:MCnc:Pt:Ser/Plas:Qn:: 3.8

## 2018-11-03 LAB — APTT: APTT: 26.7 s (ref 25.9–39.5)

## 2018-11-03 LAB — HEMATOCRIT: Lab: 25.2 — ABNORMAL LOW

## 2018-11-03 LAB — SMEAR REVIEW

## 2018-11-03 LAB — FIBRINOGEN: FIBRINOGEN LEVEL: 751 mg/dL — ABNORMAL HIGH (ref 177–386)

## 2018-11-03 LAB — EGFR CKD-EPI NON-AA MALE: Lab: 50 — ABNORMAL LOW

## 2018-11-03 LAB — HEPARIN CORRELATION: Lab: <0.2

## 2018-11-03 LAB — FIBRINOGEN LEVEL: Lab: 751 — ABNORMAL HIGH

## 2018-11-03 LAB — PROTIME: Lab: 13.3 — ABNORMAL HIGH

## 2018-11-03 LAB — D-DIMER QUANTITATIVE (CH,ML,PD,ET): Lab: 5193 — ABNORMAL HIGH

## 2018-11-03 NOTE — Unmapped (Signed)
Afebrile, VSS. IVF and IV abx as ordered. Pt denies any pain so far. On chemo day 3 at 1445. Poor appetite. WCTM.  Problem: Adult Inpatient Plan of Care  Goal: Plan of Care Review  Outcome: Progressing  Goal: Patient-Specific Goal (Individualization)  Outcome: Progressing  Goal: Absence of Hospital-Acquired Illness or Injury  Outcome: Progressing  Goal: Optimal Comfort and Wellbeing  Outcome: Progressing  Goal: Readiness for Transition of Care  Outcome: Progressing  Goal: Rounds/Family Conference  Outcome: Progressing     Problem: Fall Injury Risk  Goal: Absence of Fall and Fall-Related Injury  Outcome: Progressing     Problem: Self-Care Deficit  Goal: Improved Ability to Complete Activities of Daily Living  Outcome: Progressing     Problem: Asthma Comorbidity  Goal: Maintenance of Asthma Control  Outcome: Progressing     Problem: COPD Comorbidity  Goal: Maintenance of COPD Symptom Control  Outcome: Progressing     Problem: Diabetes Comorbidity  Goal: Blood Glucose Level Within Desired Range  Outcome: Progressing     Problem: Heart Failure Comorbidity  Goal: Maintenance of Heart Failure Symptom Control  Outcome: Progressing     Problem: Hypertension Comorbidity  Goal: Blood Pressure in Desired Range  Outcome: Progressing     Problem: Obstructive Sleep Apnea Risk or Actual (Comorbidity Management)  Goal: Unobstructed Breathing During Sleep  Outcome: Progressing     Problem: Pain Chronic (Persistent) (Comorbidity Management)  Goal: Acceptable Pain Control and Functional Ability  Outcome: Progressing     Problem: Seizure Disorder Comorbidity  Goal: Maintenance of Seizure Control  Outcome: Progressing     Problem: Skin Injury Risk Increased  Goal: Skin Health and Integrity  Outcome: Progressing     Problem: Anemia (Chemotherapy Effects)  Goal: Anemia Symptom Improvement  Outcome: Progressing     Problem: Urinary Bleeding Risk or Actual (Chemotherapy Effects)  Goal: Absence of Hematuria  Outcome: Progressing     Problem: Nausea and Vomiting (Chemotherapy Effects)  Goal: Fluid and Electrolyte Balance  Outcome: Progressing     Problem: Neurotoxicity (Chemotherapy Effects)  Goal: Neurotoxicity Symptom Control  Outcome: Progressing     Problem: Neutropenia (Chemotherapy Effects)  Goal: Absence of Infection  Outcome: Progressing     Problem: Oral Mucositis (Chemotherapy Effects)  Goal: Improved Oral Mucous Membrane Integrity  Outcome: Progressing     Problem: Thrombocytopenia Bleeding Risk (Chemotherapy Effects)  Goal: Absence of Bleeding  Outcome: Progressing

## 2018-11-03 NOTE — Unmapped (Signed)
MedE Progress Note    Interval Events- NAEON. Continues to be afebrile. Was very animated this AM, had a lot of energy. Ate breakfast and had improved strength on exam. States he feels like today is going to be a good day. Continuing treatment with IV abx, chemotherapy. Tachycardia improving off of IV fluids.      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 Kyle Huffman is a 67 y.o. male with PMHx of HTN, T2DM, CKD-III who presented to [facility] with CN aortic valve endocarditis and monocytic AML, receiving C1 of aza/venetoclax (C1D1 10/31/18).     Culture-Negative Aortic Valve Endocarditis: Patient met sepsis criteria on admission to OSH and source was felt to be multiple dental abscesses seen on CT MaxFac. Multiple sets of blood cultures are NG at 5 days, however he was taking oral amoxicillin on admission. HACEK serologies negative. TEE was performed on 11/14 that showed medium-sized (0.6cm x0.9cm) vegetation on the left ventricular aspect of the left coronary cusp; TTE here 11/25 confirmed. S/p broad spectrum abx at OSH from 11/8- 11/21. No signs of HF, blood cxs continue to be clear. DDx also includes marantic endocarditis 2/2 leukemia. Was fevering intermittently on presentation, now resolved s/p tooth extractions and chemo initiation 11/27.   - f/u repeat BCx 11/26; NGTD  -- IV vanc dosed per pharmacy, Ceftriaxone 2g q24h; at least 4 weeks of IV abx  -- ICID consulted, appreciate recs; consider vertebral bx (holding off given diffuse bony uptake on PET/CT, unable to localize a potential infectious source)   ??  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 3q chromosome. FLT3 negative. PET/CT 11/26 with diffuse uptake but limited to bone marrow. Using the [facility] prognostic model, he should be considered high risk. Started chemo 11/27. TLS/DIC labs improved.  -- Allopurinol 300 mg daily   - Aza/venetoclax C1D1 11/27  - TLS/DIC labs BID   -- Transfusion Thresholds: Hgb <7, PLT <10, fibrinogen <150  - calorie count given poor PO and nutritional status   - amicar swishes Q8H to prevent mucosal bleeding post tooth extraction, given low plt    Sinus Tachycardia: Improved. Patient presented with intermittent sinus tachycardia to 110s. 11/25 HR increased to 120s-140s. All other VSS, EKG with sinus tach. Intermittently febrile but no other signs/sx of infection (see above). No new O2 requirement. Non-toxic appearing on exam. Likely due to his underlying disease process, also in setting of extremely poor PO intake, dry on exam. Improved with chemo, IV fluids.   - CTM  ??  Bilateral Lower Extremity Weakness: Improved. MRI T/L Sp[facility] done at [facility] showed neural foraminal narrowing at L3-4 and L5-S1 (moderate on the right side). Also showed diffusely abnormal bone marrow signal in the spine concerning for infiltrative tumor. No canal narrowing. Strength seems relatively symmetric, with mild diffuse weakness, though patient able to ambulate. Question is whether this could be  due to CNS component of his leukemia vs deconditioning in the setting of illness.   -- PT/OT  -- LP attempted 11/24, patient did not tolerate presumably due to discomfort with positioning; no plans to re-attempt currently  - ICID consulted, appreciate recs; consider vertebral biopsy for poss infectious source, however PET demonstrated diffuse bony disease, thus weakness unlikely to be due to localized infection and more likely due to disease burden     Delirium: Resolved. Patient intermittently A&Ox3, but at night was getting agitated and combative when care attempted. Patient notably has underlying brain injury, previous strokes. This in combo with current illnesses make him high risk for recurrent delirium.  - delirium precautions   - zyprexa 5 mg QPM  ??  T2DM: Home regimen is Lantus 42u nightly, empagliflozin 10mg  daily, and metformin 1000mg  BID. Last HbA1c 6.7% in August 2019.   -- Lantus 10 u nightly (home dose 40u), decreased as pt is not eating much and AM glucose was low  -- SSI with POCT glucose checks  -- Hold home metformin, empagliflozin  ??  ?AKI on CKD-III: Stable, could be patient's new baseline. Creatinine varies from 0.9-1.4. Closer to 0.8-0.9 on admission to OSH, up-trended to ~1.6 on admission to [facility], and has since been stable despite IV fluids. Holding ACEI and diuretic. Monitoring TLS labs, not contributing currently.   -- Avoid nephrotoxins, renally dose medications as appropriate  -- Hold ACEI and HCTZ   -- Encourage PO intake      Hypertension: Home regimen is HCTZ 12.5mg  daily, lisinopril 20mg  daily. Was getting metoprolol tartrate as substitute for HCTZ at OSH and diltiazem. Favored starting amlodipine over these options.   -- amlodipine 10 mg daily  -- Hold lisinopril and HCTZ as above  ??  Daily Checklist:  Diet: Regular   DVT PPx: not indicated due to thrombocytopenia   GI PPx: Not Indicated  Code Status: DNR and DNI  Dispo: MDE1, Inpatient, Floor  ___________________________________________________________________    Labs/Studies:  Labs and Studies from the last 24hrs per EMR and Reviewed    Objective:  Temp:  [36.1 ??C-37.3 ??C] 36.1 ??C  Heart Rate:  [95-116] 104  Resp:  [18-20] 20  BP: (134-177)/(61-76) 134/64  SpO2:  [95 %-96 %] 95 %,     Intake/Output Summary (Last 24 hours) at 11/03/2018 1307  Last data filed at 11/03/2018 1000  Gross per 24 hour   Intake 480 ml Output 1550 ml   Net -1070 ml       Patient Lines/Drains/Airways Status    Active Active Lines, Drains, & Airways     Name:   Placement date:   Placement time:   Site:   Days:    CVC Triple Lumen 10/29/18 Non-tunneled Right Internal jugular   10/29/18    0851    Internal jugular   5              GEN:  NAD, resting comfortably in bed, pleasant   HEENT: EOMI, mucous membranes moist. No active bleeding in mouth  CARDIO: Tachycardic, regular rhythm, faint systolic murmur LUSB  RESPIRATORY: Normal work of breathing on RA.   SKIN: No diaphoresis, skin dry.   MSK: Evidence of muscle wasting, particularly in thighs.  Neuro: A&O x3. CN intact. Strength 5/5 in BLE.   R CVAD in place, dressing c/d/i    Salome Arnt, M.D.

## 2018-11-04 DIAGNOSIS — C9300 Acute monoblastic/monocytic leukemia, not having achieved remission: Secondary | ICD-10-CM | POA: Diagnosis not present

## 2018-11-04 DIAGNOSIS — R509 Fever, unspecified: Secondary | ICD-10-CM | POA: Diagnosis not present

## 2018-11-04 DIAGNOSIS — R531 Weakness: Secondary | ICD-10-CM | POA: Diagnosis not present

## 2018-11-04 DIAGNOSIS — Z452 Encounter for adjustment and management of vascular access device: Secondary | ICD-10-CM | POA: Diagnosis not present

## 2018-11-04 DIAGNOSIS — E119 Type 2 diabetes mellitus without complications: Secondary | ICD-10-CM | POA: Diagnosis not present

## 2018-11-04 DIAGNOSIS — I358 Other nonrheumatic aortic valve disorders: Secondary | ICD-10-CM | POA: Diagnosis not present

## 2018-11-04 DIAGNOSIS — C9200 Acute myeloblastic leukemia, not having achieved remission: Principal | ICD-10-CM

## 2018-11-04 LAB — URINALYSIS
BACTERIA: NONE SEEN /HPF
BILIRUBIN UA: NEGATIVE
GLUCOSE UA: 150
KETONES UA: NEGATIVE
NITRITE UA: NEGATIVE
PH UA: 6 (ref 5.0–9.0)
PROTEIN UA: 30 — AB
RBC UA: 2 /HPF (ref <=3)
SPECIFIC GRAVITY UA: 1.013 (ref 1.003–1.030)
SQUAMOUS EPITHELIAL: <1 /HPF (ref 0–5)
UROBILINOGEN UA: 0.2
WBC UA: 2 /HPF (ref <=2)

## 2018-11-04 LAB — BLOOD GAS CRITICAL CARE PANEL, VENOUS
BASE EXCESS VENOUS: 0.9 (ref -2.0–2.0)
CALCIUM IONIZED VENOUS (MG/DL): 4.62 mg/dL (ref 4.40–5.40)
GLUCOSE WHOLE BLOOD: 90 mg/dL
HCO3 VENOUS: 25 mmol/L (ref 22–27)
HEMOGLOBIN BLOOD GAS: 7.5 g/dL — ABNORMAL LOW (ref 13.50–17.50)
O2 SATURATION VENOUS: 81.5 % (ref 40.0–85.0)
PCO2 VENOUS: 43 mmHg (ref 40–60)
PO2 VENOUS: 47 mmHg (ref 30–55)
SODIUM WHOLE BLOOD: 140 mmol/L (ref 135–145)

## 2018-11-04 LAB — APTT
Coagulation surface induced:Time:Pt:PPP:Qn:Coag: 27.7
HEPARIN CORRELATION: <0.2

## 2018-11-04 LAB — LACTATE DEHYDROGENASE
LACTATE DEHYDROGENASE: 1981 U/L — ABNORMAL HIGH (ref 338–610)
Lactate dehydrogenase:CCnc:Pt:Ser/Plas:Qn:: 1981 — ABNORMAL HIGH
Lactate dehydrogenase:CCnc:Pt:Ser/Plas:Qn:: 2295 — ABNORMAL HIGH

## 2018-11-04 LAB — CBC W/ AUTO DIFF
BASOPHILS ABSOLUTE COUNT: 0 10*9/L (ref 0.0–0.1)
EOSINOPHILS ABSOLUTE COUNT: 0 10*9/L (ref 0.0–0.4)
EOSINOPHILS RELATIVE PERCENT: 0.9 %
HEMATOCRIT: 25.1 % — ABNORMAL LOW (ref 41.0–53.0)
HEMOGLOBIN: 8.4 g/dL — ABNORMAL LOW (ref 13.5–17.5)
LARGE UNSTAINED CELLS: 4 % (ref 0–4)
LYMPHOCYTES ABSOLUTE COUNT: 0.5 10*9/L — ABNORMAL LOW (ref 1.5–5.0)
LYMPHOCYTES RELATIVE PERCENT: 23.8 %
MEAN CORPUSCULAR HEMOGLOBIN CONC: 33.4 g/dL (ref 31.0–37.0)
MEAN CORPUSCULAR HEMOGLOBIN: 30.5 pg (ref 26.0–34.0)
MEAN PLATELET VOLUME: 10.7 fL — ABNORMAL HIGH (ref 7.0–10.0)
MONOCYTES ABSOLUTE COUNT: 0.1 10*9/L — ABNORMAL LOW (ref 0.2–0.8)
MONOCYTES RELATIVE PERCENT: 4.8 %
NEUTROPHILS RELATIVE PERCENT: 65.2 %
PLATELET COUNT: 63 10*9/L — ABNORMAL LOW (ref 150–440)
RED BLOOD CELL COUNT: 2.75 10*12/L — ABNORMAL LOW (ref 4.50–5.90)
RED CELL DISTRIBUTION WIDTH: 16.5 % — ABNORMAL HIGH (ref 12.0–15.0)
WBC ADJUSTED: 2.1 10*9/L — ABNORMAL LOW (ref 4.5–11.0)

## 2018-11-04 LAB — COMPREHENSIVE METABOLIC PANEL
ALBUMIN: 2.2 g/dL — ABNORMAL LOW (ref 3.5–5.0)
ALKALINE PHOSPHATASE: 355 U/L — ABNORMAL HIGH (ref 38–126)
ALKALINE PHOSPHATASE: 334 U/L — ABNORMAL HIGH (ref 38–126)
ALT (SGPT): 33 U/L (ref <50)
ALT (SGPT): 27 U/L (ref <50)
ANION GAP: 6 mmol/L — ABNORMAL LOW (ref 7–15)
ANION GAP: 6 mmol/L — ABNORMAL LOW (ref 7–15)
AST (SGOT): 31 U/L (ref 19–55)
AST (SGOT): 21 U/L (ref 19–55)
BILIRUBIN TOTAL: 0.5 mg/dL (ref 0.0–1.2)
BILIRUBIN TOTAL: 0.3 mg/dL (ref 0.0–1.2)
BLOOD UREA NITROGEN: 21 mg/dL (ref 7–21)
BLOOD UREA NITROGEN: 21 mg/dL (ref 7–21)
BUN / CREAT RATIO: 15
BUN / CREAT RATIO: 16
CALCIUM: 7.7 mg/dL — ABNORMAL LOW (ref 8.5–10.2)
CALCIUM: 7.5 mg/dL — ABNORMAL LOW (ref 8.5–10.2)
CHLORIDE: 105 mmol/L (ref 98–107)
CHLORIDE: 107 mmol/L (ref 98–107)
CO2: 25 mmol/L (ref 22.0–30.0)
CREATININE: 1.36 mg/dL — ABNORMAL HIGH (ref 0.70–1.30)
CREATININE: 1.34 mg/dL — ABNORMAL HIGH (ref 0.70–1.30)
EGFR CKD-EPI AA MALE: 61 mL/min/{1.73_m2} (ref >=60)
EGFR CKD-EPI NON-AA MALE: 53 mL/min/{1.73_m2} — ABNORMAL LOW (ref >=60)
EGFR CKD-EPI NON-AA MALE: 54 mL/min/{1.73_m2} — ABNORMAL LOW (ref >=60)
GLUCOSE RANDOM: 264 mg/dL — ABNORMAL HIGH (ref 65–179)
POTASSIUM: 3.8 mmol/L (ref 3.5–5.0)
POTASSIUM: 3.3 mmol/L — ABNORMAL LOW (ref 3.5–5.0)
PROTEIN TOTAL: 4.7 g/dL — ABNORMAL LOW (ref 6.5–8.3)
PROTEIN TOTAL: 4.4 g/dL — ABNORMAL LOW (ref 6.5–8.3)
SODIUM: 137 mmol/L (ref 135–145)

## 2018-11-04 LAB — EGFR CKD-EPI AA MALE: Lab: 61

## 2018-11-04 LAB — URIC ACID
Urate:MCnc:Pt:Ser/Plas:Qn:: 1.8 — ABNORMAL LOW
Urate:MCnc:Pt:Ser/Plas:Qn:: 2.1 — ABNORMAL LOW

## 2018-11-04 LAB — SMEAR REVIEW

## 2018-11-04 LAB — BLOOD UREA NITROGEN: Urea nitrogen:MCnc:Pt:Ser/Plas:Qn:: 21

## 2018-11-04 LAB — GLUCOSE WHOLE BLOOD: Glucose:MCnc:Pt:Bld:Qn:: 90

## 2018-11-04 LAB — D-DIMER QUANTITATIVE (CH,ML,PD,ET): Lab: 3586 — ABNORMAL HIGH

## 2018-11-04 LAB — PHOSPHORUS
Phosphate:MCnc:Pt:Ser/Plas:Qn:: 3.2
Phosphate:MCnc:Pt:Ser/Plas:Qn:: 3.5

## 2018-11-04 LAB — FIBRINOGEN LEVEL
Lab: 659 — ABNORMAL HIGH
Lab: 659 — ABNORMAL HIGH

## 2018-11-04 LAB — NITRITE UA: Lab: NEGATIVE

## 2018-11-04 LAB — LYMPHOCYTES RELATIVE PERCENT: Lab: 23.8

## 2018-11-04 LAB — PROTIME: Lab: 11.7

## 2018-11-04 LAB — SLIDE REVIEW

## 2018-11-04 NOTE — Unmapped (Signed)
MedE Progress Note    Interval Events- NAEON. Ate better yesterday, and blood sugars expectedly higher. Had some pain in legs b/l, but stated it was pretty mild this am, and well controlled. No fever/chills/nausea. Pt agreeable to continuing to try to get OOB today.        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 Demani Mcbrien is a 67 y.o. male with PMHx of HTN, T2DM, CKD-III who presented to [facility] with CN aortic valve endocarditis and monocytic AML, receiving C1 of aza/venetoclax (C1D1 10/31/18).     Culture-Negative Aortic Valve Endocarditis: Patient met sepsis criteria on admission to OSH and source was felt to be multiple dental abscesses seen on CT MaxFac. Multiple sets of blood cultures are NG at 5 days, however he was taking oral amoxicillin on admission. HACEK serologies negative. TEE was performed on 11/14 that showed medium-sized (0.6cm x0.9cm) vegetation on the left ventricular aspect of the left coronary cusp; TTE here 11/25 confirmed. S/p broad spectrum abx at OSH from 11/8- 11/21. No signs of HF, blood cxs continue to be clear. DDx also includes marantic endocarditis 2/2 leukemia. Was fevering intermittently on presentation, now resolved s/p tooth extractions and chemo initiation 11/27.   - f/u repeat BCx 11/26; NGTD  -- IV vanc dosed per pharmacy, Ceftriaxone 2g q24h; at least 4 weeks of IV abx.  (11/22 - )   -- ICID consulted, appreciate recs; consider vertebral bx (holding off given diffuse bony uptake on PET/CT, unable to localize a potential infectious source)   ??  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 3q chromosome. FLT3 negative. PET/CT 11/26 with diffuse uptake but limited to bone marrow. Using the [facility] prognostic model, he should be considered high risk. Started chemo 11/27. TLS/DIC labs improved.  -- Allopurinol 300 mg daily   - Aza/venetoclax C1D1 11/27  - TLS/DIC labs BID   -- Transfusion Thresholds: Hgb <7, PLT <10, fibrinogen <150  - calorie count given poor PO and nutritional status   - was prev on amicar swishes Q8H to prevent mucosal bleeding post tooth extraction, but d/c on 12/1 as plts have gone up and no bleeding     Sinus Tachycardia: Improved. Patient presented with intermittent sinus tachycardia to 110s. 11/25 HR increased to 120s-140s. All other VSS, EKG with sinus tach. Intermittently febrile but no other signs/sx of infection (see above). No new O2 requirement. Non-toxic appearing on exam. Likely due to his underlying disease process, also in setting of extremely poor PO intake, dry on exam. Improved with chemo, IV fluids.   - CTM  ??  Bilateral Lower Extremity Weakness: Improved. MRI T/L Spine done at [facility] showed neural foraminal narrowing at L3-4 and L5-S1 (moderate on the right side). Also showed diffusely abnormal bone marrow signal in the spine concerning for infiltrative tumor. No canal narrowing. Strength seems relatively symmetric, with mild diffuse weakness,  though patient able to ambulate. Question is whether this could be due to CNS component of his leukemia vs deconditioning in the setting of illness.   -- PT/OT  -- LP attempted 11/24, patient did not tolerate presumably due to discomfort with positioning; no plans to re-attempt currently  - ICID consulted, appreciate recs; consider vertebral biopsy for poss infectious source, however PET demonstrated diffuse bony disease, thus weakness unlikely to be due to localized infection and more likely due to disease burden     Delirium: Resolved. Patient intermittently A&Ox3, but at night was getting agitated and combative when care attempted. Patient notably has underlying brain injury, previous strokes. This in combo with current illnesses make him high risk for recurrent delirium.  - delirium precautions   - zyprexa 5 mg QPM  ??  T2DM: Home regimen is Lantus 42u nightly, empagliflozin 10mg  daily, and metformin 1000mg  BID. Last HbA1c 6.7% in August 2019. Blood sugars now increased after appetite improved.   -- Lantus 10 u nightly (home dose 40u), fasting sugars still seem well controlled, but may need to titrate up   -- Schedule 5u Lispro TID with meals, titrate as needed   -- SSI with POCT glucose checks  -- Hold home metformin, empagliflozin  ??  ?AKI on CKD-III: Stable, could be patient's new baseline. Creatinine varies from 0.9-1.4. Closer to 0.8-0.9 on admission to OSH, up-trended to ~1.6 on admission to [facility], and has since been stable despite IV fluids. Holding ACEI and diuretic. Monitoring TLS labs, not contributing currently.   -- Avoid nephrotoxins, renally dose medications as appropriate  -- Hold ACEI and HCTZ   -- Encourage PO intake      Hypertension: Home regimen is HCTZ 12.5mg  daily, lisinopril 20mg  daily. Was getting metoprolol tartrate as substitute for HCTZ at OSH and diltiazem. Favored starting amlodipine over these options.   -- amlodipine 10 mg daily  -- Hold lisinopril and HCTZ as above  ??  Daily Checklist:  Diet: Regular   DVT PPx: Lovenox   GI PPx: Not Indicated  Code Status: DNR and DNI  Dispo: MDE1, Inpatient, Floor  ___________________________________________________________________    Labs/Studies:  Labs and Studies from the last 24hrs per EMR and Reviewed    Objective:  Temp:  [36.1 ??C-36.8 ??C] 36.3 ??C  Heart Rate:  [99-116] 99  Resp:  [20] 20  BP: (111-159)/(54-72) 111/54  SpO2:  [92 %-99 %] 99 %,     Intake/Output Summary (Last 24 hours) at 11/04/2018 1230  Last data filed at 11/03/2018 2300  Gross per 24 hour   Intake 270 ml   Output 480 ml   Net -210 ml       Patient Lines/Drains/Airways Status    Active Active Lines, Drains, & Airways     Name:   Placement date:   Placement time:   Site:   Days:    CVC Triple Lumen 10/29/18 Non-tunneled Right Internal jugular   10/29/18    0851    Internal jugular   6              GEN:   NAD, resting comfortably in bed   HEENT: EOMI, MMM. No bleeding or fluctuance in area of prior tooth extractions, appear to be healing well.   CARDIO: RRR, no murmurs.  RESPIRATORY: Normal work of breathing on RA.   ABDOMEN: BS present. Soft, non-tender, non-distended.  EXT: Warm and well-perfused, no edema  SKIN: No diaphoresis, no visible rashes   Neuro: AOx3, strength 5/5  in exts, MAEE .

## 2018-11-04 NOTE — Unmapped (Signed)
Outcome Summary: VSSA, AAO to person, place; inconsistent with time and situation. D6 Vidaza/venetoclax @ 1400. Falls maintained, bed alarm on with multiple bed exits. AC/HS with elevated results, covered for 344. Incontinent B/B. C/o new ABD pain- MD made aware. no new orders. Tylenol x1 with moderate effect. Otherwise no new c/o, no reported/observed falls/injuries, NAEON.  _____________________________________________  Problem: Adult Inpatient Plan of Care  Goal: Plan of Care Review  11/04/2018 0450 by Theophilus Kinds, RN  Flowsheets (Taken 11/04/2018 [phone])  Progress: no change  Plan of Care Reviewed With: patient  Outcome Summary: VSSA, AAO to person, place; inconsistent with time and situation. D6 Vidaza/venetoclax @ 1400. Falls maintained, bed alarm on with multiple bed exits. AC/HS with elevated results, covered for 344. Incontinent B/B. C/o new ABD pain- MD made aware. no new orders. Tylenol x1 with moderate effect. Otherwise no new c/o, no reported/observed falls/injuries, NAEON.  11/04/2018 0446 by Theophilus Kinds, RN  Outcome: Progressing  Flowsheets (Taken 11/04/2018 [phone])  Progress: no change  Plan of Care Reviewed With: patient  Outcome Summary: VSSA, AAO to person, place; inconsistent with time and situation. D6 Vidaza/venetoclax @ 1400. Falls maintained, bed alarm on with multiple bed exits. AC/HS with elevated results, covered for 344. Incontinent B/B. C/o new ABD pain- MD made aware. no new orders. Tylenol x1 with moderate effect. Otherwise no new c/o, no reported/observed falls/injuries, NAEON.  Goal: Patient-Specific Goal (Individualization)  11/04/2018 0450 by Theophilus Kinds, RN  Flowsheets (Taken 11/04/2018 [phone])  Patient-Specific Goals (Include Timeframe): Pt will return to baseline cognitive function PTD; no reported bleeding from tooth extractions sites ON, Pt's POCT glucose will be maintained in lower end of range PTD.  Individualized Care Needs: Pain mgmt plan reviewed, Treatment plan reviewed, appropriate times to expect med team for care discussions reviewed. BED ALARM ON.  Anxieties, Fears or Concerns: Pt wants to go home immediately  11/04/2018 0446 by Theophilus Kinds, RN  Outcome: Progressing  Goal: Absence of Hospital-Acquired Illness or Injury  Outcome: Progressing  Intervention: Identify and Manage Fall Risk  Flowsheets (Taken 11/04/2018 0450)  Safety Interventions: mobility aid; bed alarm; chemotherapeutic agent precautions; commode/urinal/bedpan at bedside; fall reduction program maintained; infection management; lighting adjusted for tasks/safety; low bed; nonskid shoes/slippers when out of bed; supervised activity; toileting scheduled  Intervention: Prevent Skin Injury  Flowsheets (Taken 11/04/2018 0450)  Pressure Reduction Techniques: frequent weight shift encouraged  Intervention: Prevent VTE (venous thromboembolism)  Flowsheets (Taken 11/04/2018 0450)  VTE Prevention/Management: bleeding risk factors identified; fluids promoted  Intervention: Prevent Infection  Flowsheets (Taken 11/04/2018 0450)  Infection Prevention: handwashing promoted; personal protective equipment utilized; rest/sleep promoted; single patient room provided  Goal: Optimal Comfort and Wellbeing  Outcome: Progressing  Intervention: Monitor Pain and Promote Comfort  Flowsheets (Taken 11/04/2018 0450)  Pain Management Interventions: pain management plan reviewed with patient/caregiver; relaxation techniques promoted; quiet environment facilitated; care clustered  Intervention: Provide Person-Centered Care  Flowsheets (Taken 11/04/2018 0450)  Trust Relationship/Rapport: care explained; choices provided; emotional support provided; empathic listening provided; questions answered; questions encouraged; reassurance provided; thoughts/feelings acknowledged  Goal: Readiness for Transition of Care  Outcome: Progressing  Goal: Rounds/Family Conference  Outcome: Progressing     Problem: Fall Injury Risk  Goal: Absence of Fall and Fall-Related Injury  Outcome: Progressing  Intervention: Identify and Manage Contributors to Fall Injury Risk  Flowsheets  Taken 11/04/2018 0450  Medication Review/Management: medications reviewed  Taken 11/03/2018 2030  Self-Care Promotion: independence encouraged;BADL personal objects within reach;BADL personal routines maintained  Intervention: Promote Injury-Free Environment  Flowsheets (Taken 11/04/2018 0450)  Safety Interventions: mobility aid; bed alarm; chemotherapeutic agent precautions; commode/urinal/bedpan at bedside; fall reduction program maintained; infection management; lighting adjusted for tasks/safety; low bed; nonskid shoes/slippers when out of bed; supervised activity; toileting scheduled  Environmental Safety Modification: assistive device/personal items within reach; clutter free environment maintained; mobility aid in reach; room near unit station; room organization consistent; lighting adjusted     Problem: Self-Care Deficit  Goal: Improved Ability to Complete Activities of Daily Living  Outcome: Progressing  Intervention: Promote Activity and Functional Independence  Flowsheets  Taken 11/01/2018 1151 by Raymon Mutton, PT  Activity Assistance Provided: assistance, 1 person  Taken 11/03/2018 2030 by Theophilus Kinds, RN  Self-Care Promotion: independence encouraged;BADL personal objects within reach;BADL personal routines maintained     Problem: Diabetes Comorbidity  Goal: Blood Glucose Level Within Desired Range  Outcome: Progressing  Intervention: Maintain Glycemic Control  Flowsheets (Taken 11/03/2018 2102)  Glycemic Management: blood glucose monitoring;supplemental insulin given     Problem: Hypertension Comorbidity  Goal: Blood Pressure in Desired Range  Outcome: Progressing  Intervention: Maintain Hypertension-Management Strategies  Flowsheets (Taken 11/04/2018 0450)  Medication Review/Management: medications reviewed     Problem: Skin Injury Risk Increased  Goal: Skin Health and Integrity  Outcome: Progressing  Intervention: Optimize Skin Protection  Flowsheets  Taken 11/04/2018 0450 by Theophilus Kinds, RN  Pressure Reduction Techniques: frequent weight shift encouraged  Taken 11/03/2018 2030 by Theophilus Kinds, RN  Pressure Reduction Devices: pressure-redistributing mattress utilized  Taken 10/30/2018 2048 by Colin Ina, RN  Skin Protection: zinc oxide barrier cream     Problem: Anemia (Chemotherapy Effects)  Goal: Anemia Symptom Improvement  Outcome: Progressing     Problem: Urinary Bleeding Risk or Actual (Chemotherapy Effects)  Goal: Absence of Hematuria  Outcome: Progressing     Problem: Nausea and Vomiting (Chemotherapy Effects)  Goal: Fluid and Electrolyte Balance  Outcome: Progressing     Problem: Neurotoxicity (Chemotherapy Effects)  Goal: Neurotoxicity Symptom Control  Outcome: Progressing     Problem: Neutropenia (Chemotherapy Effects)  Goal: Absence of Infection  Outcome: Progressing     Problem: Oral Mucositis (Chemotherapy Effects)  Goal: Improved Oral Mucous Membrane Integrity  Outcome: Progressing     Problem: Thrombocytopenia Bleeding Risk (Chemotherapy Effects)  Goal: Absence of Bleeding  Outcome: Progressing

## 2018-11-04 NOTE — Unmapped (Signed)
VSS, pt free from injuries/falls.  Pt tolerated D5 of SQ vidaza and PO venetoclax.  Pt mainly AOx4 for majority of shift, more drowsy at evening.  Pt OOB to chair for 1.5 hours today, appetite somewhat improved today per family.  WCTM.

## 2018-11-05 DIAGNOSIS — R531 Weakness: Secondary | ICD-10-CM | POA: Diagnosis not present

## 2018-11-05 DIAGNOSIS — C9300 Acute monoblastic/monocytic leukemia, not having achieved remission: Secondary | ICD-10-CM | POA: Diagnosis not present

## 2018-11-05 DIAGNOSIS — E119 Type 2 diabetes mellitus without complications: Secondary | ICD-10-CM | POA: Diagnosis not present

## 2018-11-05 DIAGNOSIS — R509 Fever, unspecified: Secondary | ICD-10-CM | POA: Diagnosis not present

## 2018-11-05 DIAGNOSIS — I358 Other nonrheumatic aortic valve disorders: Secondary | ICD-10-CM | POA: Diagnosis not present

## 2018-11-05 LAB — CBC W/ AUTO DIFF
BASOPHILS ABSOLUTE COUNT: 0 10*9/L (ref 0.0–0.1)
BASOPHILS RELATIVE PERCENT: 1.2 %
EOSINOPHILS ABSOLUTE COUNT: 0 10*9/L (ref 0.0–0.4)
EOSINOPHILS RELATIVE PERCENT: 0.5 %
HEMATOCRIT: 23.4 % — ABNORMAL LOW (ref 41.0–53.0)
HEMOGLOBIN: 7.7 g/dL — ABNORMAL LOW (ref 13.5–17.5)
LARGE UNSTAINED CELLS: 3 % (ref 0–4)
LYMPHOCYTES ABSOLUTE COUNT: 0.5 10*9/L — ABNORMAL LOW (ref 1.5–5.0)
LYMPHOCYTES RELATIVE PERCENT: 21.7 %
MEAN CORPUSCULAR HEMOGLOBIN: 30.2 pg (ref 26.0–34.0)
MEAN CORPUSCULAR VOLUME: 91.6 fL (ref 80.0–100.0)
MEAN PLATELET VOLUME: 9.2 fL (ref 7.0–10.0)
MONOCYTES ABSOLUTE COUNT: 0.1 10*9/L — ABNORMAL LOW (ref 0.2–0.8)
MONOCYTES RELATIVE PERCENT: 5.7 %
NEUTROPHILS ABSOLUTE COUNT: 1.5 10*9/L — ABNORMAL LOW (ref 2.0–7.5)
NEUTROPHILS RELATIVE PERCENT: 68 %
PLATELET COUNT: 60 10*9/L — ABNORMAL LOW (ref 150–440)
RED BLOOD CELL COUNT: 2.56 10*12/L — ABNORMAL LOW (ref 4.50–5.90)
WBC ADJUSTED: 2.2 10*9/L — ABNORMAL LOW (ref 4.5–11.0)

## 2018-11-05 LAB — COMPREHENSIVE METABOLIC PANEL
ALBUMIN: 2.2 g/dL — ABNORMAL LOW (ref 3.5–5.0)
ALKALINE PHOSPHATASE: 333 U/L — ABNORMAL HIGH (ref 38–126)
ANION GAP: 2 mmol/L — ABNORMAL LOW (ref 7–15)
AST (SGOT): 21 U/L (ref 19–55)
BILIRUBIN TOTAL: 0.5 mg/dL (ref 0.0–1.2)
BLOOD UREA NITROGEN: 19 mg/dL (ref 7–21)
BUN / CREAT RATIO: 13
CHLORIDE: 108 mmol/L — ABNORMAL HIGH (ref 98–107)
CO2: 27 mmol/L (ref 22.0–30.0)
CREATININE: 1.41 mg/dL — ABNORMAL HIGH (ref 0.70–1.30)
EGFR CKD-EPI AA MALE: 59 mL/min/{1.73_m2} — ABNORMAL LOW (ref >=60)
EGFR CKD-EPI NON-AA MALE: 51 mL/min/{1.73_m2} — ABNORMAL LOW (ref >=60)
GLUCOSE RANDOM: 130 mg/dL (ref 65–179)
POTASSIUM: 4.1 mmol/L (ref 3.5–5.0)
PROTEIN TOTAL: 4.6 g/dL — ABNORMAL LOW (ref 6.5–8.3)

## 2018-11-05 LAB — PHOSPHORUS: Phosphate:MCnc:Pt:Ser/Plas:Qn:: 3.4

## 2018-11-05 LAB — D-DIMER QUANTITATIVE (CH,ML,PD,ET): Lab: 7178 — ABNORMAL HIGH

## 2018-11-05 LAB — URIC ACID: Urate:MCnc:Pt:Ser/Plas:Qn:: 2.1 — ABNORMAL LOW

## 2018-11-05 LAB — APTT
Coagulation surface induced:Time:Pt:PPP:Qn:Coag: 27.2
HEPARIN CORRELATION: <0.2

## 2018-11-05 LAB — LACTATE DEHYDROGENASE: Lactate dehydrogenase:CCnc:Pt:Ser/Plas:Qn:: 1852 — ABNORMAL HIGH

## 2018-11-05 LAB — PROTIME: Lab: 12.2

## 2018-11-05 LAB — VANCOMYCIN TROUGH: Vancomycin^trough:MCnc:Pt:Ser/Plas:Qn:: 15.3

## 2018-11-05 LAB — NEUTROPHILS ABSOLUTE COUNT: Lab: 1.5 — ABNORMAL LOW

## 2018-11-05 LAB — GLUCOSE RANDOM: Glucose:MCnc:Pt:Ser/Plas:Qn:: 130

## 2018-11-05 LAB — FIBRINOGEN LEVEL: Lab: 603 — ABNORMAL HIGH

## 2018-11-05 NOTE — Unmapped (Addendum)
You have a follow up appointment scheduled with Dr. Orlie Dakin on 11/15/18 @ 8:45AM.   Please call him at [phone] if you need to reschedule this appointment.    Jeralyn Ruths, MD??   [address]   [place], [place] [address]

## 2018-11-05 NOTE — Unmapped (Signed)
TPA Administration Procedure note    Indication:  Total Occlusion    The CVAD Liaison was paged to assess this patient's central catheter for a total occlusion.  The patency of the occluded line was verified per protocol and found to be totally occluded.  TPA was instilled into the blue port per protocol.  New clave was placed and line was flagged per protocol while TPA was allowed to dwell for 2 hours.  Care nurse was notified.    Prior to use, the care nurse will withdraw TPA and check line for patency.  RN will notify CVAD Liaison if further assistance is needed.     Thank You,    Elizebeth Brooking RN Adult Specialty Care Team, pager [phone]       Workup Time:  30 minutes

## 2018-11-05 NOTE — Unmapped (Signed)
Vancomycin Therapeutic Monitoring Pharmacy Note    Kyle Huffman is a 67 y.o. male starting vancomycin. Date of therapy restart: 11/12 from OSH (per notes, also received from 11/8-11/10)    Indication: Endocarditis    Prior Dosing Information: Current regimen 1250 mg IV Q24H      Goals:  Therapeutic Drug Levels  Vancomycin trough goal: 15-20 mg/L    Additional Clinical Monitoring/Outcomes  Renal function, volume status (intake and output)    Results: Vancomycin level 15.3 mg/L (drawn appropriately).       Wt Readings from Last 1 Encounters:   10/31/18 67.2 kg (148 lb 2.4 oz)     Creatinine   Date Value Ref Range Status   11/05/2018 1.41 (H) 0.70 - 1.30 mg/dL Final   81/19/1478 2.95 (H) 0.70 - 1.30 mg/dL Final   62/13/0865 7.84 (H) 0.70 - 1.30 mg/dL Final        Pharmacokinetic Considerations and Significant Drug Interactions:  ? Adult (calculated on 11/22): Vd = 46 L, ke = 0.0406 hr-1  ? Concurrent nephrotoxic meds: not applicable    Assessment/Plan:  Recommendation(s)  ? Continue current regimen of vancomycin 1250 mg IV Q24H  ? Estimated trough on recommended regimen: 16 mg/L    Follow-up  ? Level due: in 5-7 days.   ? A pharmacist will continue to monitor and order levels as appropriate    Please page service pharmacist with questions/clarifications.    Clemens Catholic, PharmD, BCPS, BCOP  Clinical Pharmacy Specialist, [facility]  Pager [phone]

## 2018-11-05 NOTE — Unmapped (Signed)
MedE Progress Note    Interval Events- Patient febrile to 38.4 7 pm last night. Felt well, no localizing symptoms/labs/exam findings. ANC 1.5. Bcx re-drawn. Broadened from ceftriaxone to cefepime, will CTM. Otherwise feeling well, no concerns. C1D6 aza/venetoclax. PO intake improving, OOB to chair yesterday.        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 Kyle Huffman is a 67 y.o. male with PMHx of HTN, T2DM, CKD-III who presented to [facility] with CN aortic valve endocarditis and monocytic AML, receiving C1 of aza/venetoclax (C1D1 10/31/18).     Culture-Negative Aortic Valve Endocarditis: Patient met sepsis criteria on admission to OSH and source was felt to be multiple dental abscesses seen on CT MaxFac. Multiple sets of blood cultures are NG at 5 days, however he was taking oral amoxicillin on admission. HACEK serologies negative. TEE was performed on 11/14 that showed medium-sized (0.6cm x0.9cm) vegetation on the left ventricular aspect of the left coronary cusp; TTE here 11/25 confirmed. S/p broad spectrum abx at OSH from 11/8- 11/21. No signs of HF, blood cxs continue to be clear. DDx also includes marantic endocarditis 2/2 leukemia. S/p teeth extraction 11/27.   - f/u repeat BCx 11/26; NGTD  -- IV vanc dosed per pharmacy (11/22-, Ceftriaxone 2g q24h (11/22-12/2), cefepime (12/2- (see below)  - consider 4 weeks IV abx from presumed source control (teeth extraction 11/27)  -- ICID consulted, appreciate recs; consider vertebral bx (holding off given diffuse bony uptake on PET/CT, unable to localize a potential infectious source)     Fever: Patient fevered to 38.4 12/2 AM. Other VSS. Felt well, no new or localizing symptoms, no focal findings on exam. D Dimer did double , but patient is on DVT prophy, and no new O2 req or leg swelling on exam. UA, CXR unremarkable. BCx redrawn, broadened to vanc/cefepime from vanc/ceftriaxone. Patient is not neutropenic, and last fever was 11/27. No new meds recently. Unclear source currently; ddx: infection vs leukemia.   - IV vanc/cefepime as above  - f/u repeat BCx 12/2  - CTM   ??  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 3q chromosome. FLT3 negative. PET/CT 11/26 with diffuse uptake but limited to bone marrow. Using the [facility] prognostic model, he should be considered high risk. Started chemo 11/27. TLS/DIC labs improved.  -- Allopurinol 300 mg daily   - Aza/venetoclax C1D1 11/27  - TLS/DIC labs daily    -- Transfusion Thresholds: Hgb <7, PLT <10, fibrinogen <150  - calorie count given poor PO and nutritional status     Sinus Tachycardia: Improved. Patient presented with intermittent sinus tachycardia to 110s. 11/25 HR increased to 120s-140s. All other VSS, EKG with sinus tach. Intermittently febrile but no other signs/sx of infection (see above). No new O2 requirement. Non-toxic appearing on exam. Likely due to his underlying disease process, also in setting of extremely poor PO intake,  dry on exam. Improved with chemo, IV fluids.   - CTM  ??  Bilateral Lower Extremity Weakness: Improved. MRI T/L Spine done at [facility] showed neural foraminal narrowing at L3-4 and L5-S1 (moderate on the right side). Also showed diffusely abnormal bone marrow signal in the spine concerning for infiltrative tumor. No canal narrowing. Strength at times symmetric vs R>L (seems limited by pain which is intermittent), with mild diffuse weakness, though patient able to ambulate. Question is whether this could be due to CNS component of his leukemia vs deconditioning/pain in the setting of illness.   -- PT/OT  -- LP attempted 11/24, patient did not tolerate presumably due to discomfort with positioning; no plans to re-attempt currently  - ICID consulted, appreciate recs; consider vertebral biopsy for poss infectious source, however PET demonstrated diffuse bony disease, thus weakness unlikely to be due to localized infection and more likely due to disease burden     Delirium: Resolved. Patient was getting agitated and combative when care attempted at night. Patient notably has underlying brain injury, previous strokes. This in combo with current illnesses make him high risk for recurrent delirium. Resolved with initiation of chemo.   - delirium precautions   - zyprexa 5 mg QPM  ??  T2DM: Home regimen is Lantus 42u nightly, empagliflozin 10mg  daily, and metformin 1000mg  BID. Last HbA1c 6.7% in August 2019. Started on reduced dose of lantus given poor PO. Blood sugars now increased after appetite improved.   -- Lantus 10 u nightly (home dose 40u), fasting sugars still seem well controlled, but may need to titrate up   -- Scheduled 5u Lispro TID with meals, titrate as needed   -- SSI with POCT glucose checks  -- Hold home metformin, empagliflozin  ??  CKD-III: Stable. Creatinine varies from 0.9-1.4. ~1.6 on admission to [facility], improved to w/i baseline subsequently. Continuing to hold home ACEI and diuretic given normotension. Monitoring TLS labs, not contributing currently.   -- Avoid nephrotoxins, renally dose medications as appropriate  -- Hold home ACEI and HCTZ   -- Encourage PO intake      Hypertension: Home regimen is HCTZ 12.5mg  daily, lisinopril 20mg  daily. Was getting metoprolol tartrate as substitute for HCTZ given c/f AKI at OSH, as well as diltiazem for some reason. Favored starting amlodipine over these options.   -- amlodipine 10 mg daily  -- Hold lisinopril and HCTZ as above  ??  Daily Checklist:  Diet: Regular   DVT PPx: Lovenox   GI PPx: Not Indicated  Code Status: DNR and DNI  Dispo: MDE1, Inpatient, Floor  ___________________________________________________________________    Labs/Studies:  Labs and Studies from the last 24hrs per EMR and Reviewed    Objective:  Temp:  [36.5 ??C-38.4 ??C] 36.9 ??C  Heart Rate:  [94-109] 109  Resp:  [20] 20  BP: (110-145)/(58-67) 110/58  SpO2:  [96 %-98 %] 98 %,     Intake/Output Summary (Last 24 hours) at 11/05/2018 0848  Last data filed at 11/05/2018 4010  Gross per 24 hour   Intake ???   Output 1725 ml   Net -1725 ml       Patient Lines/Drains/Airways Status    Active Active Lines, Drains, & Airways     Name:   Placement date:   Placement time:   Site:   Days:    CVC Triple Lumen 10/29/18 Non-tunneled Right Internal jugular   10/29/18    0851    Internal jugular   6  GEN:   NAD, resting comfortably in bed   HEENT: EOMI, MMM. No bleeding or fluctuance in area of prior tooth extractions, appear to be healing well.   CARDIO: Tachycardic, faint systolic murmur LUSB   RESPIRATORY: Normal work of breathing on RA. Lungs CTAB   ABDOMEN: BS present. Soft, non-tender, non-distended.  EXT: Warm and well-perfused, no edema  SKIN: No diaphoresis, no visible rashes   Neuro: AOx3, strength 5/5 in RLE, 4/5 LLE  R CVAD in place, dressing c/d/i    Salome Arnt, M.D.

## 2018-11-05 NOTE — Unmapped (Signed)
Problem: Adult Inpatient Plan of Care  Goal: Plan of Care Review  Outcome: Progressing  Goal: Patient-Specific Goal (Individualization)  Outcome: Progressing  Goal: Absence of Hospital-Acquired Illness or Injury  Outcome: Progressing  Goal: Optimal Comfort and Wellbeing  Outcome: Progressing  Goal: Readiness for Transition of Care  Outcome: Progressing     Problem: Fall Injury Risk  Goal: Absence of Fall and Fall-Related Injury  Outcome: Progressing     Problem: Self-Care Deficit  Goal: Improved Ability to Complete Activities of Daily Living  Outcome: Progressing     Problem: Diabetes Comorbidity  Goal: Blood Glucose Level Within Desired Range  Outcome: Progressing     Problem: Hypertension Comorbidity  Goal: Blood Pressure in Desired Range  Outcome: Progressing     Problem: Skin Injury Risk Increased  Goal: Skin Health and Integrity  Outcome: Progressing     Problem: Anemia (Chemotherapy Effects)  Goal: Anemia Symptom Improvement  Outcome: Progressing     Problem: Urinary Bleeding Risk or Actual (Chemotherapy Effects)  Goal: Absence of Hematuria  Outcome: Progressing     Problem: Nausea and Vomiting (Chemotherapy Effects)  Goal: Fluid and Electrolyte Balance  Outcome: Progressing     Problem: Neurotoxicity (Chemotherapy Effects)  Goal: Neurotoxicity Symptom Control  Outcome: Progressing     Problem: Neutropenia (Chemotherapy Effects)  Goal: Absence of Infection  Outcome: Progressing     Problem: Oral Mucositis (Chemotherapy Effects)  Goal: Improved Oral Mucous Membrane Integrity  Outcome: Progressing     Problem: Thrombocytopenia Bleeding Risk (Chemotherapy Effects)  Goal: Absence of Bleeding  Outcome: Progressing   PT VSS after the febrile episode at the beginning of the shift. Pleasant confused. Bed alarm on, and pt free of fall. Oxycodone gave for ABD pain. OOB to bathroom with walker. PT denied N/V, denied extremities numbness. RA. No acute stress noticed. Continue POC.

## 2018-11-05 NOTE — Unmapped (Signed)
[facility] Specialty Medication Referral: PA APPROVED    Medication (Brand/Generic): Venclexta    Initial Test Claim completed with resulted information below:    Patient ABLE to fill at [facility]  Insurance Company:  [facility]  Anticipated Copay: $0  Is anticipated copay with a copay card or grant? Yes    Does patient's insurance plan only allow a 15 day supply for the first 6 fills in the Split Fill Program? No  If yes, inform patient they can request to dis-enroll from the [facility] by calling the patient help desk at N/A.      If the copay is under the $25 defined limit, per policy there will be no further investigation of need for financial assistance at this time unless patient requests. This referral has been communicated to the provider and handed off to the [facility] team for further processing and filling of prescribed medication.   ______________________________________________________________________  Please utilize this referral for viewing purposes as it will serve as the central location for all relevant documentation and updates.

## 2018-11-05 NOTE — Unmapped (Signed)
Patient alert and oriented X4.  Patient free from falls and all essential falls precautions maintained. Pt pt medicated per mar for pain,  pt denies the need for the need for additional pain medication, nausea, vomiting, shortness of breath and all other discomforts. Pt VSS WCM

## 2018-11-06 DIAGNOSIS — E119 Type 2 diabetes mellitus without complications: Secondary | ICD-10-CM | POA: Diagnosis not present

## 2018-11-06 DIAGNOSIS — N183 Chronic kidney disease, stage 3 (moderate): Secondary | ICD-10-CM | POA: Diagnosis not present

## 2018-11-06 DIAGNOSIS — C9300 Acute monoblastic/monocytic leukemia, not having achieved remission: Secondary | ICD-10-CM | POA: Diagnosis not present

## 2018-11-06 DIAGNOSIS — R509 Fever, unspecified: Secondary | ICD-10-CM | POA: Diagnosis not present

## 2018-11-06 DIAGNOSIS — R531 Weakness: Secondary | ICD-10-CM | POA: Diagnosis not present

## 2018-11-06 DIAGNOSIS — I358 Other nonrheumatic aortic valve disorders: Secondary | ICD-10-CM | POA: Diagnosis not present

## 2018-11-06 LAB — LACTATE DEHYDROGENASE: Lactate dehydrogenase:CCnc:Pt:Ser/Plas:Qn:: 1517 — ABNORMAL HIGH

## 2018-11-06 LAB — CBC W/ AUTO DIFF
BASOPHILS ABSOLUTE COUNT: 0 10*9/L (ref 0.0–0.1)
BASOPHILS RELATIVE PERCENT: 1 %
EOSINOPHILS ABSOLUTE COUNT: 0 10*9/L (ref 0.0–0.4)
EOSINOPHILS RELATIVE PERCENT: 0.4 %
HEMATOCRIT: 23.2 % — ABNORMAL LOW (ref 41.0–53.0)
HEMOGLOBIN: 7.4 g/dL — ABNORMAL LOW (ref 13.5–17.5)
LARGE UNSTAINED CELLS: 2 % (ref 0–4)
LYMPHOCYTES ABSOLUTE COUNT: 0.5 10*9/L — ABNORMAL LOW (ref 1.5–5.0)
LYMPHOCYTES RELATIVE PERCENT: 26.3 %
MEAN CORPUSCULAR HEMOGLOBIN: 29.3 pg (ref 26.0–34.0)
MEAN CORPUSCULAR VOLUME: 91.2 fL (ref 80.0–100.0)
MEAN PLATELET VOLUME: 9.6 fL (ref 7.0–10.0)
MONOCYTES ABSOLUTE COUNT: 0.1 10*9/L — ABNORMAL LOW (ref 0.2–0.8)
MONOCYTES RELATIVE PERCENT: 3.3 %
NEUTROPHILS ABSOLUTE COUNT: 1.2 10*9/L — ABNORMAL LOW (ref 2.0–7.5)
NEUTROPHILS RELATIVE PERCENT: 67.1 %
PLATELET COUNT: 54 10*9/L — ABNORMAL LOW (ref 150–440)
RED CELL DISTRIBUTION WIDTH: 16 % — ABNORMAL HIGH (ref 12.0–15.0)
WBC ADJUSTED: 1.8 10*9/L — ABNORMAL LOW (ref 4.5–11.0)

## 2018-11-06 LAB — COMPREHENSIVE METABOLIC PANEL
ALBUMIN: 2.3 g/dL — ABNORMAL LOW (ref 3.5–5.0)
ALKALINE PHOSPHATASE: 333 U/L — ABNORMAL HIGH (ref 38–126)
ALT (SGPT): 23 U/L (ref <50)
ANION GAP: 4 mmol/L — ABNORMAL LOW (ref 7–15)
AST (SGOT): 24 U/L (ref 19–55)
BILIRUBIN TOTAL: 0.7 mg/dL (ref 0.0–1.2)
BLOOD UREA NITROGEN: 20 mg/dL (ref 7–21)
BUN / CREAT RATIO: 14
CALCIUM: 7.8 mg/dL — ABNORMAL LOW (ref 8.5–10.2)
CO2: 26 mmol/L (ref 22.0–30.0)
EGFR CKD-EPI AA MALE: 56 mL/min/{1.73_m2} — ABNORMAL LOW (ref >=60)
EGFR CKD-EPI NON-AA MALE: 49 mL/min/{1.73_m2} — ABNORMAL LOW (ref >=60)
GLUCOSE RANDOM: 171 mg/dL (ref 65–179)
POTASSIUM: 4.4 mmol/L (ref 3.5–5.0)
PROTEIN TOTAL: 4.8 g/dL — ABNORMAL LOW (ref 6.5–8.3)
SODIUM: 135 mmol/L (ref 135–145)

## 2018-11-06 LAB — INR: Lab: 1.09

## 2018-11-06 LAB — APTT
Coagulation surface induced:Time:Pt:PPP:Qn:Coag: 29.3
HEPARIN CORRELATION: 0.2

## 2018-11-06 LAB — MEAN PLATELET VOLUME: Lab: 9.6

## 2018-11-06 LAB — URIC ACID: Urate:MCnc:Pt:Ser/Plas:Qn:: 1.9 — ABNORMAL LOW

## 2018-11-06 LAB — D-DIMER QUANTITATIVE (CH,ML,PD,ET): Lab: 7451 — ABNORMAL HIGH

## 2018-11-06 LAB — FIBRINOGEN LEVEL: Lab: 586 — ABNORMAL HIGH

## 2018-11-06 LAB — PHOSPHORUS: Phosphate:MCnc:Pt:Ser/Plas:Qn:: 3.4

## 2018-11-06 LAB — BLOOD UREA NITROGEN: Urea nitrogen:MCnc:Pt:Ser/Plas:Qn:: 20

## 2018-11-06 NOTE — Unmapped (Signed)
Malignant Hematology (MDE1) Progress Note    Interval History:  Patient seen and examined this morning and afternoon. He states he is doing well and has no complaints. Denies and chest pain, shortness of breath, n/v. Tolerating oral intake. Today is last day of IV chemo, to continue venetoclax. Remains on cefepime and vancomycin today.     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 Ariel Wingrove is a 67 y.o. male with a PMHx of HTN, T2DM, CKD3, presented to [facility] with CN aortic valve endocarditis & monocytic AML, receiving C1 of aza/ventoclas (C1D1 on 10/31/2018)    Culture-Negative Aortic Valve Endocarditis:??Patient met sepsis criteria on admission to OSH and source was felt to be multiple dental abscesses seen on CT MaxFac. Multiple sets of blood cx are NG at 5 days, however he was taking oral amoxicillin on admission. HACEK serologies negative. TEE was performed on 11/14 that showed medium-sized (0.6cm x0.9cm) vegetation on the left ventricular aspect of the left coronary cusp; TTE here 11/25 confirmed. S/p broad spectrum abx at OSH from 11/8- 11/21. No signs of HF, blood cxs continue to be clear. DDx also includes marantic endocarditis 2/2 leukemia. S/p teeth extraction 11/27.   -f/u repeat BCx 11/26; NGTD  -IV vanc dosed per pharmacy (11/22-, Ceftriaxone 2g q24h (11/22-12/2), cefepime (12/2- (see below)  -Plan for four weeks of antibiotics, until 12/23. F/U cultures, and if negative will narrow back down to ceftriaxone  -ICID consulted, appreciate recs; consider vertebral bx (holding off given diffuse bony uptake on PET/CT, unable to localize a potential infectious source)   ??  Fever: Patient became febrile to 38.4 12/2 AM. Other VSS. Felt well, no new or localizing symptoms, no focal findings on exam. Patient on dvt ppx, O2 req or leg swelling on exam, UA, CXR unremarkable. BCx redrawn and patient was broadened to vanc/cefepime from vanc/ceftriaxone. Patient is not neutropenic, and last fever was 11/27. No new meds recently. Unclear source currently; differentials include: infection vs leukemia.   -IV vanc/cefepime as above  -f/u repeat BCx 12/2, if no growth, will go back to ceftriaxone  -plan to continue for four weeks from 11/27 (12/23)   ??  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 3q chromosome. FLT3 negative.??PET/CT 11/26 with diffuse uptake but limited to bone marrow. Using the MRC prognostic model, he should be considered high risk. Started chemo 11/27. TLS/DIC labs improved.  -Allopurinol 300 mg daily  -Aza/venetoclax C1D1 11/27  -TLS/DIC labs daily    -Transfusion Thresholds: Hgb <8, PLT <10, fibrinogen <150  -calorie count given poor PO and nutritional status   ??  Sinus Tachycardia: Improving. Patient presented with intermittent sinus tach to 110s. 11/25 HR increased to 120s-140s. All other VSS, EKG with sinus tach. Intermittently febrile but no other signs/sx of infection (see above). No new O2 requirement. Non-toxic appearing on exam. Likely due to his underlying disease process, also  in setting of extremely poor PO intake, dry on exam. Improved with chemo, IV fluids.   ??  Bilateral LE Weakness:??Improved. MRI T/L Spine done at [facility] showed neural foraminal narrowing at L3-4 and L5-S1 (moderate on the right side). Also showed diffusely abnormal bone marrow signal in the spine concerning for infiltrative tumor. No canal narrowing. Strength at times symmetric vs R>L (seems limited by pain which is intermittent), with mild diffuse weakness, though patient able to ambulate. Question is whether this could be due to CNS component of his leukemia vs deconditioning/pain in the setting of illness.   -PT/OT  -LP attempted 11/24, patient did not tolerate presumably due to discomfort with positioning; no plans to re-attempt currently  -ICID consulted, appreciate recs; consider vertebral biopsy for poss infectious source, however PET demonstrated diffuse bony disease, thus weakness unlikely to be due to localized infection and more likely due to disease burden   ??  Delirium: Resolved. Patient was getting agitated and combative when care attempted at night. Patient notably has underlying brain injury, previous strokes. This in combo with current illnesses make him high risk for recurrent delirium. Resolved with initiation of chemo.   -delirium precautions   -Zyprexa 5 mg nightly  ??  T2DM:??Home regimen is Lantus 42u nightly, empagliflozin 10mg  daily, and metformin 1000mg  BID. Last HbA1c 6.7% in August 2019. Started on reduced dose of lantus given poor PO. Blood sugars now increased after appetite improved.   -Lantus 10 u nightly (home dose 40u), fasting BS still seem well controlled, but may need to titrate up   -Scheduled 8 u Lispro TID with meals, will likely need to titrate up  -ISS with poc glucose checks  -Hold home metformin, empagliflozin in patient setting   ??  CKD-III:??Stable. Creatinine varies from 0.9-1.4. ~1.6 on admission to [facility], improved to w/i baseline subsequently. Creatinine is 1.46 today Continuing to hold home ACEI and diuretic given normotension. Monitoring TLS labs, not contributing currently.   -Avoid nephrotoxins, renally dose medications as appropriate  -Holding home ACEI and HCTZ, but should slowly reintroduce given blood pressures starting to increase   ??  Essential Hypertension:??Home regimen is HCTZ 12.5mg  daily, lisinopril 20mg  daily.??Was getting metoprolol tartrate as substitute for HCTZ given c/f AKI at OSH, as well as diltiazem for some reason. Favored starting amlodipine over these options.   -amlodipine 10 mg daily  -holding lisinopril and hctz as above     Daily Checklist:  Diet: Regular  DVT PPx: SCDS  Electrolytes: Replete PRN  Code Status: DNR and DNI    Dispo: F/U with local heme/onc physician about transfusion ability, oral chemo o/p, & SNF placement  ___________________________________________________________________    Labs/Studies:  Labs and studies from the last 24 hours reviewed.    Objective:  Temp:  [36.2 ??C-37.7 ??C] 36.9 ??C  Heart Rate:  [108-112] 111  Resp:  [16-20] 16  BP: (120-148)/(58-78) 122/58  SpO2:  [96 %-99 %] 99 %    GEN: NAD, alert, oriented, answers questions appropriately  CV: RRR, S1, S2, no M/R/G  RESP: CTAB, no wheezes or crackles  ABD: Normoactive bowel sounds, soft, NTND, no rebound/guarding  EXT: No clubbing, cyanosis, or edema  SKIN:  No rashes or lesions noted    Cherie Dark, DO

## 2018-11-06 NOTE — Unmapped (Signed)
Pt. Afebrile , vitals stable. Pt on day 7 chemo at 1345. Pt disoriented to time. Pt bed alarm for poor safety awareness. No falls or injuries. Pt uses urinal independently. Pt given atarax for anxiety this shift. Pt no complaints of pain. Will cont to monitor.    Problem: Adult Inpatient Plan of Care  Goal: Plan of Care Review  Outcome: Progressing  Goal: Patient-Specific Goal (Individualization)  Outcome: Progressing  Goal: Absence of Hospital-Acquired Illness or Injury  Outcome: Progressing  Goal: Optimal Comfort and Wellbeing  Outcome: Progressing  Goal: Readiness for Transition of Care  Outcome: Progressing  Goal: Rounds/Family Conference  Outcome: Progressing     Problem: Fall Injury Risk  Goal: Absence of Fall and Fall-Related Injury  Outcome: Progressing     Problem: Self-Care Deficit  Goal: Improved Ability to Complete Activities of Daily Living  Outcome: Progressing     Problem: Diabetes Comorbidity  Goal: Blood Glucose Level Within Desired Range  Outcome: Progressing     Problem: Hypertension Comorbidity  Goal: Blood Pressure in Desired Range  Outcome: Progressing     Problem: Skin Injury Risk Increased  Goal: Skin Health and Integrity  Outcome: Progressing     Problem: Anemia (Chemotherapy Effects)  Goal: Anemia Symptom Improvement  Outcome: Progressing     Problem: Urinary Bleeding Risk or Actual (Chemotherapy Effects)  Goal: Absence of Hematuria  Outcome: Progressing     Problem: Nausea and Vomiting (Chemotherapy Effects)  Goal: Fluid and Electrolyte Balance  Outcome: Progressing     Problem: Neurotoxicity (Chemotherapy Effects)  Goal: Neurotoxicity Symptom Control  Outcome: Progressing     Problem: Neutropenia (Chemotherapy Effects)  Goal: Absence of Infection  Outcome: Progressing     Problem: Oral Mucositis (Chemotherapy Effects)  Goal: Improved Oral Mucous Membrane Integrity  Outcome: Progressing     Problem: Thrombocytopenia Bleeding Risk (Chemotherapy Effects)  Goal: Absence of Bleeding  Outcome: Progressing

## 2018-11-06 NOTE — Unmapped (Signed)
[facility] Specialty Medication Referral: No PA required    Medication (Brand/Generic): VENCLEXTA 100 MG TABLET    Initial Benefits Investigation Claim completed with resulted information below:  No PA required  Patient ABLE to fill at [facility]  Insurance Company:  [facility]  Anticipated Copay: $0.00    As Co-pay is under $25 defined limit, per policy there will be no further investigation of need for financial assistance at this time unless patient requests. This referral has been communicated to the provider and handed off to the [facility] team for further processing and filling of prescribed medication.   ______________________________________________________________________  Please utilize this referral for viewing purposes as it will serve as the central location for all relevant documentation and updates.

## 2018-11-06 NOTE — Unmapped (Signed)
Pt had no complaints this shift. Pt worked with RT. Brother at bedside. VSS. Falls precautions maintained. Blue lumen TPAed - now with good blood flow. Will continue to monitor.     Problem: Adult Inpatient Plan of Care  Goal: Plan of Care Review  Outcome: Ongoing - Unchanged     Problem: Fall Injury Risk  Goal: Absence of Fall and Fall-Related Injury  Outcome: Ongoing - Unchanged     Problem: Self-Care Deficit  Goal: Improved Ability to Complete Activities of Daily Living  Outcome: Ongoing - Unchanged

## 2018-11-07 DIAGNOSIS — N183 Chronic kidney disease, stage 3 (moderate): Secondary | ICD-10-CM | POA: Diagnosis not present

## 2018-11-07 DIAGNOSIS — R531 Weakness: Secondary | ICD-10-CM | POA: Diagnosis not present

## 2018-11-07 DIAGNOSIS — E119 Type 2 diabetes mellitus without complications: Secondary | ICD-10-CM | POA: Diagnosis not present

## 2018-11-07 DIAGNOSIS — I358 Other nonrheumatic aortic valve disorders: Secondary | ICD-10-CM | POA: Diagnosis not present

## 2018-11-07 DIAGNOSIS — C9300 Acute monoblastic/monocytic leukemia, not having achieved remission: Secondary | ICD-10-CM | POA: Diagnosis not present

## 2018-11-07 LAB — LACTATE DEHYDROGENASE
LACTATE DEHYDROGENASE: 1250 U/L — ABNORMAL HIGH (ref 338–610)
Lactate dehydrogenase:CCnc:Pt:Ser/Plas:Qn:: 1250 — ABNORMAL HIGH

## 2018-11-07 LAB — COMPREHENSIVE METABOLIC PANEL
ALBUMIN: 2.6 g/dL — ABNORMAL LOW (ref 3.5–5.0)
ALKALINE PHOSPHATASE: 391 U/L — ABNORMAL HIGH (ref 38–126)
ALT (SGPT): 21 U/L (ref <50)
AST (SGOT): 20 U/L (ref 19–55)
BILIRUBIN TOTAL: 0.8 mg/dL (ref 0.0–1.2)
BLOOD UREA NITROGEN: 22 mg/dL — ABNORMAL HIGH (ref 7–21)
BUN / CREAT RATIO: 13
CALCIUM: 8.2 mg/dL — ABNORMAL LOW (ref 8.5–10.2)
CHLORIDE: 105 mmol/L (ref 98–107)
CO2: 26 mmol/L (ref 22.0–30.0)
EGFR CKD-EPI AA MALE: 49 mL/min/{1.73_m2} — ABNORMAL LOW (ref >=60)
EGFR CKD-EPI NON-AA MALE: 43 mL/min/{1.73_m2} — ABNORMAL LOW (ref >=60)
GLUCOSE RANDOM: 171 mg/dL (ref 65–179)
PROTEIN TOTAL: 5.2 g/dL — ABNORMAL LOW (ref 6.5–8.3)
SODIUM: 136 mmol/L (ref 135–145)

## 2018-11-07 LAB — CBC W/ AUTO DIFF
BASOPHILS RELATIVE PERCENT: 2 %
EOSINOPHILS ABSOLUTE COUNT: 0 10*9/L (ref 0.0–0.4)
EOSINOPHILS RELATIVE PERCENT: 0.3 %
HEMATOCRIT: 32.7 % — ABNORMAL LOW (ref 41.0–53.0)
HEMOGLOBIN: 10.9 g/dL — ABNORMAL LOW (ref 13.5–17.5)
LARGE UNSTAINED CELLS: 2 % (ref 0–4)
LYMPHOCYTES ABSOLUTE COUNT: 0.4 10*9/L — ABNORMAL LOW (ref 1.5–5.0)
LYMPHOCYTES RELATIVE PERCENT: 24.8 %
MEAN CORPUSCULAR HEMOGLOBIN CONC: 33.2 g/dL (ref 31.0–37.0)
MEAN CORPUSCULAR HEMOGLOBIN: 28.6 pg (ref 26.0–34.0)
MEAN CORPUSCULAR VOLUME: 86.2 fL (ref 80.0–100.0)
MEAN PLATELET VOLUME: 10.4 fL — ABNORMAL HIGH (ref 7.0–10.0)
MONOCYTES ABSOLUTE COUNT: 0.1 10*9/L — ABNORMAL LOW (ref 0.2–0.8)
NEUTROPHILS ABSOLUTE COUNT: 1.2 10*9/L — ABNORMAL LOW (ref 2.0–7.5)
NEUTROPHILS RELATIVE PERCENT: 67.8 %
RED BLOOD CELL COUNT: 3.8 10*12/L — ABNORMAL LOW (ref 4.50–5.90)
RED CELL DISTRIBUTION WIDTH: 15.7 % — ABNORMAL HIGH (ref 12.0–15.0)
WBC ADJUSTED: 1.7 10*9/L — ABNORMAL LOW (ref 4.5–11.0)

## 2018-11-07 LAB — D-DIMER QUANTITATIVE (CH,ML,PD,ET): Lab: 6953 — ABNORMAL HIGH

## 2018-11-07 LAB — APTT
APTT: 28.4 s (ref 25.9–39.5)
Coagulation surface induced:Time:Pt:PPP:Qn:Coag: 28.4

## 2018-11-07 LAB — FIBRINOGEN LEVEL: Lab: 562 — ABNORMAL HIGH

## 2018-11-07 LAB — PHOSPHORUS: Phosphate:MCnc:Pt:Ser/Plas:Qn:: 3.8

## 2018-11-07 LAB — BILIRUBIN TOTAL: Bilirubin:MCnc:Pt:Ser/Plas:Qn:: 0.8

## 2018-11-07 LAB — URIC ACID: Urate:MCnc:Pt:Ser/Plas:Qn:: 2.1 — ABNORMAL LOW

## 2018-11-07 LAB — LARGE UNSTAINED CELLS: Lab: 2

## 2018-11-07 LAB — PROTIME: Lab: 13

## 2018-11-07 MED FILL — VENCLEXTA 100 MG TABLET: 30 days supply | Qty: 120 | Fill #0 | Status: AC

## 2018-11-07 NOTE — Unmapped (Signed)
PICC LINE TRIAGE NOTE    The Venous Access Team has received an order for PICC placement.  This patient has been triaged and  VAT will attempt bedside placement as schedule permits .      Thank You,    Adriane Gabbert G Vayla Wilhelmi RN Venous Access Team [phone]      Workup Time:  45 minutes

## 2018-11-07 NOTE — Unmapped (Signed)
Malignant Hematology (MDE1) Progress Note    24h events/subjective:  Overnight, the patient pulled out his central venous catheter. His nurse walked in and he was holding the catheter in his hand in the middle of the night. He was not agitated and seemed confused as to what happened. This morning he has no complaints. He feels okay, no shortness of breath or chest pain. His brother has been at bedside with him and reports he has not been taking in much PO, more sugary foods than anything.  He will get PICC line today and working on setting up outpatient transfusion plan.   Platelets down to 34 today, so will hold DVT ppx.    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 Kyle Huffman is a 67 y.o. male with a PMHx of HTN, T2DM, CKD3, presented to [facility] with CN aortic valve endocarditis & monocytic AML, receiving C1 of aza/ventoclas (C1D1 on 10/31/2018)    Culture-Negative Aortic Valve Endocarditis:??Patient met sepsis criteria on admission to OSH and source was felt to be multiple dental abscesses seen on CT MaxFac. Multiple sets of blood cx are NG at 5 days, however he was taking oral amoxicillin on admission. HACEK serologies negative. TEE was performed on 11/14 that showed medium-sized (0.6cm x0.9cm) vegetation on the left ventricular aspect of the left coronary cusp; TTE here 11/25 confirmed. S/p broad spectrum abx at OSH from 11/8- 11/21. No signs of HF, blood cxs continue to be clear. DDx also includes marantic endocarditis 2/2 leukemia. S/p teeth extraction 11/27.   -f/u repeat BCx 11/26 and 12/1; NGTD  -IV vanc dosed per pharmacy (11/22-- ), Ceftriaxone 2g q24h (11/22-12/2 and 12/4-- ), cefepime (12/2-12/4)  -Plan for four weeks of antibiotics, until 12/23  ??  Fever, resolved: Patient became febrile to 38.4 12/2 AM. Other VSS. Felt well, no new or localizing symptoms, no focal findings on exam. Patient on dvt ppx, O2 req or leg swelling on exam, UA, CXR unremarkable. BCx redrawn and patient was broadened to vanc/cefepime from vanc/ceftriaxone. Patient is not neutropenic, and last fever was 11/27. No new meds recently. Unclear source currently; differentials include: infection vs leukemia.   -12/1 BCx no growth. Will narrow back to vanc/ CTX as above  -plan to continue for four weeks from 11/27 (12/23)     Monocytic AML: BMBx results 11/22 c/w monocytic AML with 65% blasts, cytogenetics reveal a tri8 and a ???jumping??? 3q chromosome. FLT3 negative.??PET/CT 11/26 with diffuse uptake but limited to bone marrow. Using the MRC prognostic model, he should be considered high risk. Started chemo 11/27. TLS/DIC labs improved.  -Aza/venetoclax C1D1 11/27 - 12/4 is C1D8  -Transfusion Thresholds: Hgb <8, PLT <10, fibrinogen <150  -calorie count given poor PO and nutritional status   ??  Sinus Tachycardia: Stable. Patient presented with intermittent sinus tach to 110s. 11/25 HR increased to 120s-140s. All other VSS, EKG with sinus tach. Intermittently febrile but no other signs/sx of infection (see  above). No new O2 requirement. Non-toxic appearing on exam. Likely due to his underlying disease process, also in setting of extremely poor PO intake. He is stable. Chest pain free.  ??  Bilateral LE Weakness:??Improved. MRI T/L Spine done at [facility] showed neural foraminal narrowing at L3-4 and L5-S1 (moderate on the right side). Also showed diffusely abnormal bone marrow signal in the spine concerning for infiltrative tumor. No canal narrowing. Strength at times symmetric vs R>L (seems limited by pain which is intermittent), with mild diffuse weakness, though patient able to ambulate. Question is whether this could be due to CNS component of his leukemia vs deconditioning/pain in the setting of illness.   -PT/OT - patient will go to SNF  ??  Delirium: Resolved. Patient was getting agitated and combative when care attempted at night. Patient notably has underlying brain injury, previous strokes. This in combo with current illnesses make him high risk for recurrent delirium. Resolved with initiation of chemo.   -delirium precautions   -Zyprexa 2.5 mg nightly  ??  T2DM:??Home regimen is Lantus 42u nightly, empagliflozin 10mg  daily, and metformin 1000mg  BID. Last HbA1c 6.7% in August 2019. Started on reduced dose of lantus given poor PO. Blood sugars now increased after appetite improved.   -Increase Lantus to 15U nightly (home dose 40u)  -Increase Lispro to 10U qAC  -ISS with poc glucose checks  -Hold home metformin, empagliflozin in patient setting   ??  CKD-III:??Stable. Creatinine varies from 0.9-1.4. ~1.6 on admission to [facility], improved to w/i baseline subsequently. Has been slowly increasing, 12/4 creatinine is 1.63. Will give fluids today to help as patient continues to have poor PO. Continuing to hold home ACEI and diuretic given normotension. Monitoring TLS labs, not contributing currently.   -Avoid nephrotoxins, renally dose medications as appropriate  -Holding home ACEI and HCTZ  ??  Essential Hypertension:??Home regimen is HCTZ 12.5mg  daily, lisinopril 20mg  daily.??Was getting metoprolol tartrate as substitute for HCTZ given c/f AKI at OSH, as well as diltiazem for some reason. Favored starting amlodipine over these options. Pressures are stable for inpatient setting.  -amlodipine 10 mg daily  -holding lisinopril and hctz as above    Daily Checklist:  Diet: Regular  DVT PPx: SCDS  Electrolytes: Replete PRN  Code Status: DNR and DNI    Dispo: F/U with local heme/onc physician about transfusion ability, oral chemo o/p, & SNF placement  ___________________________________________________________________    Labs/Studies:  Labs and studies from the last 24 hours reviewed.    Objective:  Temp:  [36.2 ??C-37.2 ??C] 36.8 ??C  Heart Rate:  [100-113] 113  Resp:  [16-20] 18  BP: (122-152)/(58-78) 136/67  SpO2:  [95 %-99 %] 95 %    GEN: NAD, alert, oriented, answers questions appropriately  HEENT: MMM, no oropharyngeal exudate  CV: RRR, S1, S2, no M/R/G  RESP: CTAB, no wheezes or crackles. Normal WOB on RA.  ABD: Normoactive bowel sounds, soft, NTND, no rebound/guarding  EXT: No clubbing, cyanosis, or edema  SKIN:  No rashes or lesions noted. C/d/i dressing in place over R neck where central line was pulled.    Rosette Reveal, MD

## 2018-11-07 NOTE — Unmapped (Signed)
[facility]   Patient Onboarding/Medication Counseling    Kyle Huffman is a 67 y.o. male with AML who I am counseling today on initiation of therapy.    Medication: Venclexta 100mg     Verified patient's date of birth / HIPAA.      Education Provided: ?    Dose/Administration discussed: Take 4 tablets by mouth daily with a meal and water. Do not crush, chew or break tablet. This medication should be taken  with food.  Stressed the importance of taking medication as prescribed and to contact provider if that changes at any time.  Discussed missed dose instructions.    Storage requirements: this medicine should be stored at room temperature.     Side effects / precautions discussed: Therapy was initiated while inpatient and patient declined counseling at this time.  Patient will receive a drug information handout with shipment.    Handling precautions / disposal reviewed:  Patient was counseled on the hazards surrounding oral chemotherapy and will minimize contact/exposure to the medicine.    Drug Interactions: other medications reviewed and up to date in Epic.  No drug interactions identified.    Comorbidities/Allergies: reviewed and up to date in Epic.    Verified therapy is appropriate and should continue      Delivery Information    Medication Assistance provided: [facility]    Anticipated copay of $0 reviewed with patient. Verified delivery address in Epic.    Scheduled delivery date: 11/07/2018  Medication will be delivered via [facility] to the prescription address in [place].  This shipment will not require a signature.      Explained the services we provide at [facility] and that each month we would call to set up refills.  Stressed importance of returning phone calls so that we could ensure they receive their medications in time each month.  Informed patient that we should be setting up refills 7-10 days prior to when they will run out of medication.  Informed patient that welcome packet will be sent.      Patient verbalized understanding of the above information as well as how to contact the pharmacy at [phone] option 4 with any questions/concerns.  The pharmacy is open Monday through Friday 8:30am-4:30pm.  A pharmacist is available 24/7 via pager to answer any clinical questions they may have.        Patient Specific Needs      ? Patient has no physical, cognitive, or cultural barriers.    ? Patient prefers to have medications discussed with  Patient     ? Patient is able to read and understand education materials at a high school level or above.    ? Patient's primary language is  English           Kyle Huffman  Anders Grant  [facility] Specialty Pharmacist

## 2018-11-07 NOTE — Unmapped (Signed)
The Venous Access Team (VAT) received a request from MD for a PIV with a comment of  PICC for home antibiotics.   Paged MD to find out what it is she really wants. Also called bedside RN who states that she thinks that she wants to order a PICC. States she will let MD know to order PICC if she wants PICC -vs- PIV.        PIV Workup / Procedure Time:  15 minutes    Geralyn Flash RN was notified.     Thank you,     Elizabeth Palau RN Venous Access Team

## 2018-11-07 NOTE — Unmapped (Signed)
PICC LINE INSERTION PROCEDURE NOTE    Indications:  Antibiotic Therapy    Consent/Time Out:    Risks, benefits and alternatives discussed with patient.  Written consent was obtained prior to the procedure and is detailed in the medical record.  Prior to the start of the procedure, a time out was taken and the identity of the patient was confirmed via name, medical record number and  date of birth.  The availability of the correct equipment was verified.    Procedure Details:  The vein was identified and measured for appropriate catheter length. Maximum sterile techniques was utilized.  Sterile field prepared with necessary supplies and equipment. Insertion site was prepped with chlorhexidine solution and allowed to dry. Lidocaine 2 mL subcutaneously and intradermally administered to insertion site.  The catheter was primed with normal saline.  A 4 FR Single lumen was inserted to the R Basilic vein with 1 insertion attempt(s).  Catheter aspirated, 2 mL blood return present.  The catheter was then flushed with 10 mL of normal saline.   Insertion site cleansed, and sterile dressing applied per manufacturer guidelines. The Central Line Checklist was referenced.  The catheter was inserted without difficulty by Alfredo Bach RN and assisted by Areatha Keas RN.      Findings:  Manufacturer:  Bard  Lot #:  [id number]  CT Injectable (power):  Yes  Arm Circumference:  28  cm  Total catheter length:  41cm.    External length:  0 cm.  Catheter trimmed:  yes  Port reserved:  No       Vein Size:   Right arm true basilic vein compressible:  Yes, measurement:  (see image)      Tip Placement Verification:   3CG Technology    Workup Time:    45 minutes    Recommendations:  PICC Brochure given to patient with teaching instruction.      See vein image below:

## 2018-11-07 NOTE — Unmapped (Signed)
Pt is alert and oriented X4, afebrile vital signs stable. No complains of pain, nausea, vomit. 2 units of pRBC  provided during shift. Pt also received chemo: aza/venetoclax  day 7. Sliding scale insulin provided during shift.  Pt free of falls or injuries. Will continue to monitor.

## 2018-11-07 NOTE — Unmapped (Signed)
Problem: Adult Inpatient Plan of Care  Goal: Plan of Care Review  Outcome: Progressing  Goal: Patient-Specific Goal (Individualization)  Outcome: Progressing  Goal: Absence of Hospital-Acquired Illness or Injury  Outcome: Progressing  Goal: Optimal Comfort and Wellbeing  Outcome: Progressing  Goal: Readiness for Transition of Care  Outcome: Progressing  Goal: Rounds/Family Conference  Outcome: Progressing     Problem: Fall Injury Risk  Goal: Absence of Fall and Fall-Related Injury  Outcome: Progressing     Problem: Self-Care Deficit  Goal: Improved Ability to Complete Activities of Daily Living  Outcome: Progressing     Problem: Diabetes Comorbidity  Goal: Blood Glucose Level Within Desired Range  Outcome: Progressing     Problem: Hypertension Comorbidity  Goal: Blood Pressure in Desired Range  Outcome: Progressing     Problem: Skin Injury Risk Increased  Goal: Skin Health and Integrity  Outcome: Progressing     Problem: Anemia (Chemotherapy Effects)  Goal: Anemia Symptom Improvement  Outcome: Progressing     Problem: Urinary Bleeding Risk or Actual (Chemotherapy Effects)  Goal: Absence of Hematuria  Outcome: Progressing     Problem: Nausea and Vomiting (Chemotherapy Effects)  Goal: Fluid and Electrolyte Balance  Outcome: Progressing     Problem: Neurotoxicity (Chemotherapy Effects)  Goal: Neurotoxicity Symptom Control  Outcome: Progressing     Problem: Neutropenia (Chemotherapy Effects)  Goal: Absence of Infection  Outcome: Progressing     Problem: Oral Mucositis (Chemotherapy Effects)  Goal: Improved Oral Mucous Membrane Integrity  Outcome: Progressing     Problem: Thrombocytopenia Bleeding Risk (Chemotherapy Effects)  Goal: Absence of Bleeding  Outcome: Progressing   Afebrile, VSS. IV abx as ordered. Pt confused at night, pt pulled out the central line at night. ACHS. Pt denies any pain or discomfort so far. Bed alarm in place. Uses urinal at night. WCTM.

## 2018-11-08 DIAGNOSIS — I358 Other nonrheumatic aortic valve disorders: Secondary | ICD-10-CM | POA: Diagnosis not present

## 2018-11-08 DIAGNOSIS — E119 Type 2 diabetes mellitus without complications: Secondary | ICD-10-CM | POA: Diagnosis not present

## 2018-11-08 DIAGNOSIS — N183 Chronic kidney disease, stage 3 (moderate): Secondary | ICD-10-CM | POA: Diagnosis not present

## 2018-11-08 DIAGNOSIS — R531 Weakness: Secondary | ICD-10-CM | POA: Diagnosis not present

## 2018-11-08 DIAGNOSIS — C9300 Acute monoblastic/monocytic leukemia, not having achieved remission: Secondary | ICD-10-CM | POA: Diagnosis not present

## 2018-11-08 LAB — D-DIMER QUANTITATIVE (CH,ML,PD,ET): Lab: 7550 — ABNORMAL HIGH

## 2018-11-08 LAB — URIC ACID: Urate:MCnc:Pt:Ser/Plas:Qn:: 2.6 — ABNORMAL LOW

## 2018-11-08 LAB — CBC W/ AUTO DIFF
BASOPHILS ABSOLUTE COUNT: 0 10*9/L (ref 0.0–0.1)
BASOPHILS RELATIVE PERCENT: 0 %
EOSINOPHILS RELATIVE PERCENT: 0.4 %
HEMATOCRIT: 31.2 % — ABNORMAL LOW (ref 41.0–53.0)
HEMOGLOBIN: 10.4 g/dL — ABNORMAL LOW (ref 13.5–17.5)
LARGE UNSTAINED CELLS: 3 % (ref 0–4)
LYMPHOCYTES RELATIVE PERCENT: 48.2 %
MEAN CORPUSCULAR HEMOGLOBIN CONC: 33.2 g/dL (ref 31.0–37.0)
MEAN CORPUSCULAR HEMOGLOBIN: 29.1 pg (ref 26.0–34.0)
MEAN CORPUSCULAR VOLUME: 87.5 fL (ref 80.0–100.0)
MEAN PLATELET VOLUME: 10.4 fL — ABNORMAL HIGH (ref 7.0–10.0)
MONOCYTES ABSOLUTE COUNT: 0.1 10*9/L — ABNORMAL LOW (ref 0.2–0.8)
MONOCYTES RELATIVE PERCENT: 5.5 %
NEUTROPHILS ABSOLUTE COUNT: 0.5 10*9/L — ABNORMAL LOW (ref 2.0–7.5)
NEUTROPHILS RELATIVE PERCENT: 43 %
PLATELET COUNT: 24 10*9/L — ABNORMAL LOW (ref 150–440)
RED BLOOD CELL COUNT: 3.57 10*12/L — ABNORMAL LOW (ref 4.50–5.90)
RED CELL DISTRIBUTION WIDTH: 15.5 % — ABNORMAL HIGH (ref 12.0–15.0)
WBC ADJUSTED: 1.1 10*9/L — ABNORMAL LOW (ref 4.5–11.0)

## 2018-11-08 LAB — ALBUMIN: Albumin:MCnc:Pt:Ser/Plas:Qn:: 2.6 — ABNORMAL LOW

## 2018-11-08 LAB — COMPREHENSIVE METABOLIC PANEL
ALT (SGPT): 21 U/L (ref <50)
ANION GAP: 6 mmol/L — ABNORMAL LOW (ref 7–15)
AST (SGOT): 21 U/L (ref 19–55)
BILIRUBIN TOTAL: 0.5 mg/dL (ref 0.0–1.2)
BLOOD UREA NITROGEN: 22 mg/dL — ABNORMAL HIGH (ref 7–21)
BUN / CREAT RATIO: 14
CALCIUM: 8.2 mg/dL — ABNORMAL LOW (ref 8.5–10.2)
CHLORIDE: 104 mmol/L (ref 98–107)
CO2: 27 mmol/L (ref 22.0–30.0)
CREATININE: 1.59 mg/dL — ABNORMAL HIGH (ref 0.70–1.30)
EGFR CKD-EPI AA MALE: 51 mL/min/{1.73_m2} — ABNORMAL LOW (ref >=60)
GLUCOSE RANDOM: 140 mg/dL (ref 65–179)
POTASSIUM: 4.9 mmol/L (ref 3.5–5.0)
PROTEIN TOTAL: 5.3 g/dL — ABNORMAL LOW (ref 6.5–8.3)
SODIUM: 137 mmol/L (ref 135–145)

## 2018-11-08 LAB — PHOSPHORUS: Phosphate:MCnc:Pt:Ser/Plas:Qn:: 3.4

## 2018-11-08 LAB — APTT
APTT: 27.8 s (ref 25.9–39.5)
Coagulation surface induced:Time:Pt:PPP:Qn:Coag: 27.8

## 2018-11-08 LAB — MEAN CORPUSCULAR HEMOGLOBIN: Lab: 29.1

## 2018-11-08 LAB — LACTATE DEHYDROGENASE: Lactate dehydrogenase:CCnc:Pt:Ser/Plas:Qn:: 1026 — ABNORMAL HIGH

## 2018-11-08 LAB — VANCOMYCIN RANDOM: Vancomycin^random:MCnc:Pt:Ser/Plas:Qn:: 19.2

## 2018-11-08 LAB — FIBRINOGEN LEVEL: Lab: 609 — ABNORMAL HIGH

## 2018-11-08 LAB — PROTIME: Lab: 12.6

## 2018-11-08 MED ORDER — INSULIN LISPRO 100 UNIT/ML ~~LOC~~ SOLN
0.00 | SUBCUTANEOUS | Status: DC
Start: 2018-11-13 — End: 2018-11-08

## 2018-11-08 MED ORDER — OLANZAPINE 5 MG PO TBDP
2.50 | ORAL_TABLET | ORAL | Status: DC
Start: 2018-11-13 — End: 2018-11-08

## 2018-11-08 MED ORDER — GENERIC EXTERNAL MEDICATION
1250.00 | Status: DC
Start: 2018-11-09 — End: 2018-11-08

## 2018-11-08 MED ORDER — EPINEPHRINE 0.3 MG/0.3ML IJ SOAJ
0.30 | INTRAMUSCULAR | Status: DC
Start: ? — End: 2018-11-08

## 2018-11-08 MED ORDER — INSULIN GLARGINE 100 UNIT/ML ~~LOC~~ SOLN
25.00 | SUBCUTANEOUS | Status: DC
Start: 2018-11-08 — End: 2018-11-08

## 2018-11-08 MED ORDER — INSULIN LISPRO 100 UNIT/ML ~~LOC~~ SOLN
10.00 | SUBCUTANEOUS | Status: DC
Start: 2018-11-08 — End: 2018-11-08

## 2018-11-08 MED ORDER — FAMOTIDINE 20 MG/2ML IV SOLN
20.00 | INTRAVENOUS | Status: DC
Start: ? — End: 2018-11-08

## 2018-11-08 MED ORDER — GENERIC EXTERNAL MEDICATION
Status: DC
Start: ? — End: 2018-11-08

## 2018-11-08 MED ORDER — SODIUM CHLORIDE 0.9 % IV SOLN
1000.00 | INTRAVENOUS | Status: DC
Start: ? — End: 2018-11-08

## 2018-11-08 MED ORDER — AMLODIPINE BESYLATE 10 MG PO TABS
10.00 | ORAL_TABLET | ORAL | Status: DC
Start: 2018-11-14 — End: 2018-11-08

## 2018-11-08 MED ORDER — SODIUM CHLORIDE 0.9 % IV SOLN
20.00 | INTRAVENOUS | Status: DC
Start: ? — End: 2018-11-08

## 2018-11-08 MED ORDER — ONDANSETRON HCL 4 MG/2ML IJ SOLN
8.00 | INTRAMUSCULAR | Status: DC
Start: ? — End: 2018-11-08

## 2018-11-08 MED ORDER — GENERIC EXTERNAL MEDICATION
2.00 | Status: DC
Start: 2018-11-13 — End: 2018-11-08

## 2018-11-08 MED ORDER — METHYLPREDNISOLONE SODIUM SUCC 125 MG IJ SOLR
125.00 | INTRAMUSCULAR | Status: DC
Start: ? — End: 2018-11-08

## 2018-11-08 MED ORDER — SODIUM CHLORIDE FLUSH 0.9 % IV SOLN
10.00 | INTRAVENOUS | Status: DC
Start: 2018-11-13 — End: 2018-11-08

## 2018-11-08 MED ORDER — MEPERIDINE HCL 25 MG/ML IJ SOLN
25.00 | INTRAMUSCULAR | Status: DC
Start: ? — End: 2018-11-08

## 2018-11-08 MED ORDER — DIPHENHYDRAMINE HCL 50 MG/ML IJ SOLN
25.00 | INTRAMUSCULAR | Status: DC
Start: ? — End: 2018-11-08

## 2018-11-08 MED ORDER — VENETOCLAX 100 MG PO TABS
400.00 | ORAL_TABLET | ORAL | Status: DC
Start: 2018-11-14 — End: 2018-11-08

## 2018-11-08 MED ORDER — VALACYCLOVIR HCL 500 MG PO TABS
500.00 | ORAL_TABLET | ORAL | Status: DC
Start: 2018-11-14 — End: 2018-11-08

## 2018-11-08 MED ORDER — ACETAMINOPHEN 500 MG PO TABS
1000.00 | ORAL_TABLET | ORAL | Status: DC
Start: ? — End: 2018-11-08

## 2018-11-08 MED ORDER — HYDROXYZINE HCL 25 MG PO TABS
25.00 | ORAL_TABLET | ORAL | Status: DC
Start: ? — End: 2018-11-08

## 2018-11-08 MED ORDER — OXYCODONE HCL 5 MG PO TABS
5.00 | ORAL_TABLET | ORAL | Status: DC
Start: ? — End: 2018-11-08

## 2018-11-08 NOTE — Unmapped (Addendum)
Pharmacist Discharge Note  Reason for writing this note: new diagnosis with new medication, high risk for readmission or adverse drug reaction as determined by healthcare team    Kyle Huffman is a 67 y.o. male with CKD, HTN, T2DM who presented with newly diagnosed AML. Discharge date = on or around 12/6 if accepted to SNF. The following is a summary of medication-related changes that occurred during admission, as well as any pharmacy-related follow-up and medication access needs.     AML:  - s/p treatment with azacitidine and venetoclax, C1D1 = 10/31/18; [name] s hospitalization was complicated by culture negative endocarditis, fevers, and weakness.  - Venetoclax continued on discharge at a dose of 400 mg PO daily.  - Discharged on valacyclovir 500 mg PO daily & micafungin 50 mg IV (while at SNF) for prophylaxis (no azole antifungal prophylaxis yet due to venetoclax, bacterial prophylaxis not indicated while on broad-spectrum antibiotics).    Culture-negative Endocarditis:  - Ceftriaxone 2g IV Q24H and vancomycin 1000 mg IV Q24H prescribed upon discharge (to be administered by SNF with lab follow-up managed by SNF medical director) through 11/27/18 per ICID. PICC placed prior to discharge for continuation of antibiotics.     Highlighted Medication Changes (with rationale, if applicable):  - Home ASA discontinued due to thrombocytopenia  - Home diltiazem discontinued due to DDI with venetoclax, HCTZ and lisinopril discontinued due to TLS risk and AKI; amlodipine 10 mg PO daily prescribed upon discharge  - Home empagliflozin and metformin discontinued due to AKI  - Simvastatin changed to pravastatin since tolerated pravastatin inpatient and has fewer drug interactions.  - Lantus requirements increased as patient approached discharge as of 12/6 dose was 25 units qhs and was being increased slowly. Dose prior to admission was 42 unitsnightly  - Olanzapine 2.5 mg PO QHS prescribed for delirium/agitation Appointments and monitoring:  Future Appointments   Date Time Provider Department Center   110/17/202019 12:00 PM ADULT ONC LAB [facility]   110/17/202019  1:00 PM Kyle Lynford Citizen, NP [id number]   110/17/202019  2:00 PM Kaitlyn Nadara Eaton, CPP [id number]   11/26/2018  1:00 PM ADULT ONC LAB [facility]   11/26/2018  2:00 PM Megan Raeanne Barry, PA [id number]   11/29/2018  8:15 AM ADULT ONC LAB [facility]   11/29/2018  9:00 AM Avie Arenas, MD [id number]       Medication Access:  - Venetoclax copay $700, grant obtained bringing copay to $0 (see referral tab); 30-day supply dispensed from [facility] and sent with patient to SNF. Patient used inpatient supply during hospitalization therefore supply for SNF was full 30 days.    Outpatient Pharmacy Follow-Up:   [ ]  Venetoclax monitoring/follow-up: Please continue to monitor tolerance and adherence to venetoclax, and coordinate subsequent fills with [facility] and SNF (unclear how long patient will remain in SNF).   [ ]  Antimicrobial regimen: Please follow-up completion of antibiotic course for endocarditis and initiate bacterial prophylaxis if indicated when course complete. Additionally, please follow-up plan for antifungal prophylaxis following discharge from SNF if still neutropenic    Please page with questions,    Margarite Gouge, PharmD, BCOP  Pager [phone]

## 2018-11-08 NOTE — Unmapped (Signed)
Pt VSS, remained A&O to self and place, with no agitation or attempts to bed exit. Plan is to d/c to SNF.     Problem: Adult Inpatient Plan of Care  Goal: Plan of Care Review  Outcome: Progressing  Goal: Patient-Specific Goal (Individualization)  Outcome: Progressing  Goal: Absence of Hospital-Acquired Illness or Injury  Outcome: Progressing  Goal: Optimal Comfort and Wellbeing  Outcome: Progressing  Goal: Readiness for Transition of Care  Outcome: Progressing  Goal: Rounds/Family Conference  Outcome: Progressing

## 2018-11-08 NOTE — Unmapped (Signed)
Malignant Hematology (MDE1) Progress Note    Interval History:  Patient was seen and examined this morning, with brother at bedside. He had no acute events overnight. He has no complaints of chest pain, shortness of breath, nausea, vomiting. He is doing well.   He had his PICC line placed yesterday for outpatient IV antibiotics.     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 Miquel Stacks is a 67 y.o. male with a PMHx of HTN, T2DM, CKD3, presented to [facility] with CN aortic valve endocarditis & monocytic AML, receiving C1 of aza/ventoclas (C1D1 on 10/31/2018)    Culture-Negative Aortic Valve Endocarditis:??Patient met sepsis criteria on admission to OSH and source was felt to be multiple dental abscesses seen on CT MaxFac. Multiple sets of blood cx are NG at 5 days, however he was taking oral amoxicillin on admission. HACEK serologies negative. TEE was performed on 11/14 that showed medium-sized (0.6cm x0.9cm) vegetation on the left ventricular aspect of the left coronary cusp; TTE here 11/25 confirmed. S/p broad spectrum abx at OSH from 11/8- 11/21. S/p teeth extraction 11/27.??  -f/u repeat BCx 11/26 and 12/1; NGTD  -IV vanc??dosed per pharmacy (11/22-- ), Ceftriaxone 2g q24h??(11/22-12/2 and 12/4-- ), cefepime??(12/2-12/4)  -Plan for four weeks of antibiotics, complete on 12/24  ??  Fever, resolved: Patient became febrile to 38.4 12/2 AM.??Other VSS. Felt well, no new or localizing symptoms, no focal findings on exam. Patient on dvt ppx, O2 req or leg swelling on exam, UA, CXR unremarkable. BCx redrawn and patient was broadened to vanc/cefepime from vanc/ceftriaxone. Patient is not neutropenic, and last fever was 11/27. No new meds recently. Unclear source currently; differentials include: infection vs leukemia.   -12/1 BCx no growth.   -Continue with vancomycin and ceftriaxone  -plan to continue for four weeks from 11/27 (12/24)   ??  Monocytic AML:??BMBx results 11/22 c/w monocytic AML with 65% blasts, cytogenetics reveal a tri8 and a ???jumping?????3q chromosome. FLT3 negative.??PET/CT 11/26 with diffuse uptake but limited to bone marrow. Using the MRC prognostic model, he should be considered high risk. Started chemo 11/27. TLS/DIC labs improved.  -Aza/venetoclax C1D1 11/27 - 12/4 is C1D8  -Transfusion Thresholds: Hgb <8, PLT <10, fibrinogen <150  -calorie count given poor PO and nutritional status   ??  Sinus Tachycardia: Stable. Patient presented with intermittent sinus tach to 110s  . Intermittently febrile but no other signs/sx of infection (see above), no new oxygen requirements, and remains nontoxic appearing. Likely due to his underlying disease process, also in setting of extremely poor PO intake.  ??  Bilateral LE Weakness:??Improved. MRI T/L Spine done at [facility] showed neural foraminal narrowing at L3-4 and L5-S1 (moderate on the right side). Also showed diffusely abnormal bone marrow signal in the spine concerning for infiltrative tumor. No canal narrowing. Strength??at times??symmetric vs R>L (seems limited by pain which is intermittent),??with mild diffuse weakness, though patient able to ambulate.??Question is whether this could be due to  CNS component of his leukemia vs deconditioning/pain??in the setting of illness.   -PT/OT, patient to go to SNF  ??  Delirium: Resolved. Patient was getting agitated and combative when care attempted??at night. Patient notably has underlying brain injury, previous strokes. Resolved with initiation of chemo.??  -Delirium precautions   -Zyprexa 2.5 mg nightly  ??  T2DM:??Home regimen is Lantus 42u nightly, empagliflozin 10mg  daily, and metformin 1000mg  BID. Last HbA1c 6.7% in August 2019.??Started on reduced dose of lantus given poor PO.??Will need monitoring and titration up as his appetite improves.   -Home dose of lantus is 42 units nightly. Reduced dosing here due to poor po intake. Will d/c pre-meal in attempt to start getting back to home regimen. Start 25 units lantus tonight.   -ISS with poc glucose checks  -Hold home metformin, empagliflozin in patient setting   ??  CKD-III:??Stable.??Creatinine varies from 0.9-1.4. ~1.6 on admission to [facility], improved to w/i baseline subsequently. Has been slowly increasing, 12/5 Cr is 1.59.  Will give fluids today to help as patient continues to have poor PO. Continuing to hold home??ACEI and diuretic??given normotension.   -Holding??home??ACEI and HCTZ  ??  Essential Hypertension:??Home regimen is HCTZ 12.5mg  daily, lisinopril 20mg  daily.??Was getting metoprolol tartrate as substitute for HCTZ??given c/f AKI??at OSH,??as well as??diltiazem, unclear reason. Favored starting amlodipine over these options. Pressures are stable for inpatient setting.  -amlodipine 10 mg daily  -holding lisinopril and hctz as above  -outpatient follow up with PCP to follow blood pressure    Daily Checklist:  Diet: Regular   DVT PPx: SCDS  GI PPx: replace as needed   Electrolytes: Replete PRN  Code Status: DNR and DNI    Dispo: SNF (12/6)   ___________________________________________________________________    Labs/Studies:  Labs and studies from the last 24 hours reviewed.    Objective:  Temp:  [36.2 ??C-37.3 ??C] 36.6 ??C  Heart Rate:  [98-110] 105  Resp:  [16-18] 18  BP: (108-141)/(58-69) 108/58  SpO2:  [96 %-99 %] 99 %    GEN: NAD, alert, oriented, answers questions appropriately  CV: RRR, S1, S2, no M/R/G  RESP: CTAB, no wheezes or crackles  ABD: Normoactive bowel sounds, soft, NTND, no rebound/guarding  EXT: No clubbing, cyanosis, or edema  SKIN:  No rashes or lesions noted    Cherie Dark, DO

## 2018-11-08 NOTE — Unmapped (Signed)
Vancomycin Therapeutic Monitoring Pharmacy Note    Kyle Huffman is a 67 y.o. male starting vancomycin. Date of therapy restart: 11/12 from OSH (per notes, also received from 11/8-11/10)    Indication: Endocarditis    Prior Dosing Information: Current regimen 1250 mg IV Q24H      Goals:  Therapeutic Drug Levels  Vancomycin trough goal: 15-20 mg/L    Additional Clinical Monitoring/Outcomes  Renal function, volume status (intake and output)    Results: Vancomycin level 19.2 mg/L (drawn appropriately).       Wt Readings from Last 1 Encounters:   11/07/18 62.2 kg (137 lb 2 oz)     Creatinine   Date Value Ref Range Status   11/08/2018 1.59 (H) 0.70 - 1.30 mg/dL Final   16/09/9603 5.40 (H) 0.70 - 1.30 mg/dL Final   98/10/9146 8.29 (H) 0.70 - 1.30 mg/dL Final        Pharmacokinetic Considerations and Significant Drug Interactions:  ? Adult (calculated on 15/5): Vd = 46.86 L, ke = 0.0367 hr-1  ? Concurrent nephrotoxic meds: not applicable    Assessment/Plan:  Recommendation(s)  ? Change current regimen to vancomycin 1000 mg IV Q24H due to accumulation on this regimen and plan for continuation of treatment as an outpatient (will have less frequent monitoring)  ? Estimated trough on recommended regimen: 15 mg/L    Follow-up  ? Level due: in 3-5 days.   ? A pharmacist will continue to monitor and order levels as appropriate    Please page service pharmacist with questions/clarifications.    Clemens Catholic, PharmD, BCPS, BCOP  Clinical Pharmacy Specialist, Malignant Hematology  Pager [phone]

## 2018-11-08 NOTE — Unmapped (Signed)
Pt is alert and oriented to self afebrile vital signs stable. No complains of pain, nausea, vomit. Day 8 of venetoclax .  Single lumen PICC line placed. Sliding scale insulin provided during shift.  Pt free of falls or injuries. Will continue to monitor.        Problem: Adult Inpatient Plan of Care  Goal: Plan of Care Review  Outcome: Ongoing - Unchanged  Goal: Patient-Specific Goal (Individualization)  Outcome: Ongoing - Unchanged  Goal: Absence of Hospital-Acquired Illness or Injury  Outcome: Ongoing - Unchanged  Goal: Optimal Comfort and Wellbeing  Outcome: Ongoing - Unchanged  Goal: Readiness for Transition of Care  Outcome: Ongoing - Unchanged  Goal: Rounds/Family Conference  Outcome: Ongoing - Unchanged     Problem: Fall Injury Risk  Goal: Absence of Fall and Fall-Related Injury  Outcome: Ongoing - Unchanged     Problem: Self-Care Deficit  Goal: Improved Ability to Complete Activities of Daily Living  Outcome: Ongoing - Unchanged     Problem: Diabetes Comorbidity  Goal: Blood Glucose Level Within Desired Range  Outcome: Ongoing - Unchanged     Problem: Hypertension Comorbidity  Goal: Blood Pressure in Desired Range  Outcome: Ongoing - Unchanged     Problem: Skin Injury Risk Increased  Goal: Skin Health and Integrity  Outcome: Ongoing - Unchanged     Problem: Anemia (Chemotherapy Effects)  Goal: Anemia Symptom Improvement  Outcome: Ongoing - Unchanged     Problem: Urinary Bleeding Risk or Actual (Chemotherapy Effects)  Goal: Absence of Hematuria  Outcome: Ongoing - Unchanged     Problem: Nausea and Vomiting (Chemotherapy Effects)  Goal: Fluid and Electrolyte Balance  Outcome: Ongoing - Unchanged     Problem: Neurotoxicity (Chemotherapy Effects)  Goal: Neurotoxicity Symptom Control  Outcome: Ongoing - Unchanged     Problem: Neutropenia (Chemotherapy Effects)  Goal: Absence of Infection  Outcome: Ongoing - Unchanged     Problem: Oral Mucositis (Chemotherapy Effects)  Goal: Improved Oral Mucous Membrane Integrity Outcome: Ongoing - Unchanged     Problem: Thrombocytopenia Bleeding Risk (Chemotherapy Effects)  Goal: Absence of Bleeding  Outcome: Ongoing - Unchanged

## 2018-11-09 DIAGNOSIS — I10 Essential (primary) hypertension: Secondary | ICD-10-CM | POA: Diagnosis not present

## 2018-11-09 DIAGNOSIS — I358 Other nonrheumatic aortic valve disorders: Secondary | ICD-10-CM | POA: Diagnosis not present

## 2018-11-09 DIAGNOSIS — N183 Chronic kidney disease, stage 3 (moderate): Secondary | ICD-10-CM | POA: Diagnosis not present

## 2018-11-09 DIAGNOSIS — E119 Type 2 diabetes mellitus without complications: Secondary | ICD-10-CM | POA: Diagnosis not present

## 2018-11-09 DIAGNOSIS — R531 Weakness: Secondary | ICD-10-CM | POA: Diagnosis not present

## 2018-11-09 DIAGNOSIS — C9300 Acute monoblastic/monocytic leukemia, not having achieved remission: Secondary | ICD-10-CM | POA: Diagnosis not present

## 2018-11-09 LAB — CBC W/ AUTO DIFF
BASOPHILS ABSOLUTE COUNT: 0 10*9/L (ref 0.0–0.1)
BASOPHILS RELATIVE PERCENT: 0 %
EOSINOPHILS ABSOLUTE COUNT: 0 10*9/L (ref 0.0–0.4)
EOSINOPHILS RELATIVE PERCENT: 1 %
HEMATOCRIT: 29.5 % — ABNORMAL LOW (ref 41.0–53.0)
LYMPHOCYTES ABSOLUTE COUNT: 0.5 10*9/L — ABNORMAL LOW (ref 1.5–5.0)
LYMPHOCYTES RELATIVE PERCENT: 61.3 %
MEAN CORPUSCULAR HEMOGLOBIN CONC: 33.3 g/dL (ref 31.0–37.0)
MEAN CORPUSCULAR HEMOGLOBIN: 29.2 pg (ref 26.0–34.0)
MEAN CORPUSCULAR VOLUME: 87.6 fL (ref 80.0–100.0)
MEAN PLATELET VOLUME: 8.9 fL (ref 7.0–10.0)
MONOCYTES ABSOLUTE COUNT: 0.1 10*9/L — ABNORMAL LOW (ref 0.2–0.8)
MONOCYTES RELATIVE PERCENT: 6.3 %
NEUTROPHILS ABSOLUTE COUNT: 0.3 10*9/L — CL (ref 2.0–7.5)
NEUTROPHILS RELATIVE PERCENT: 29.1 %
PLATELET COUNT: 40 10*9/L — ABNORMAL LOW (ref 150–440)
RED BLOOD CELL COUNT: 3.37 10*12/L — ABNORMAL LOW (ref 4.50–5.90)
RED CELL DISTRIBUTION WIDTH: 15.5 % — ABNORMAL HIGH (ref 12.0–15.0)
WBC ADJUSTED: 0.9 10*9/L — ABNORMAL LOW (ref 4.5–11.0)

## 2018-11-09 LAB — INR: Lab: 1.1

## 2018-11-09 LAB — COMPREHENSIVE METABOLIC PANEL
ALKALINE PHOSPHATASE: 417 U/L — ABNORMAL HIGH (ref 38–126)
ALT (SGPT): 20 U/L (ref <50)
ANION GAP: 8 mmol/L (ref 7–15)
AST (SGOT): 22 U/L (ref 19–55)
BILIRUBIN TOTAL: 0.4 mg/dL (ref 0.0–1.2)
BLOOD UREA NITROGEN: 22 mg/dL — ABNORMAL HIGH (ref 7–21)
BUN / CREAT RATIO: 15
CALCIUM: 7.8 mg/dL — ABNORMAL LOW (ref 8.5–10.2)
CHLORIDE: 103 mmol/L (ref 98–107)
CO2: 26 mmol/L (ref 22.0–30.0)
CREATININE: 1.48 mg/dL — ABNORMAL HIGH (ref 0.70–1.30)
EGFR CKD-EPI AA MALE: 55 mL/min/{1.73_m2} — ABNORMAL LOW (ref >=60)
GLUCOSE RANDOM: 112 mg/dL (ref 65–179)
POTASSIUM: 4.5 mmol/L (ref 3.5–5.0)
PROTEIN TOTAL: 5.3 g/dL — ABNORMAL LOW (ref 6.5–8.3)
SODIUM: 137 mmol/L (ref 135–145)

## 2018-11-09 LAB — APTT
APTT: 26.6 s (ref 25.9–39.5)
Coagulation surface induced:Time:Pt:PPP:Qn:Coag: 26.6
HEPARIN CORRELATION: <0.2

## 2018-11-09 LAB — D-DIMER QUANTITATIVE (CH,ML,PD,ET): Lab: 8407 — ABNORMAL HIGH

## 2018-11-09 LAB — PHOSPHORUS: Phosphate:MCnc:Pt:Ser/Plas:Qn:: 3.8

## 2018-11-09 LAB — FIBRINOGEN LEVEL: Lab: 609 — ABNORMAL HIGH

## 2018-11-09 LAB — URIC ACID: Urate:MCnc:Pt:Ser/Plas:Qn:: 2.8 — ABNORMAL LOW

## 2018-11-09 LAB — LACTATE DEHYDROGENASE: Lactate dehydrogenase:CCnc:Pt:Ser/Plas:Qn:: 928 — ABNORMAL HIGH

## 2018-11-09 LAB — PLATELET COUNT: Lab: 40 — ABNORMAL LOW

## 2018-11-09 LAB — SODIUM: Sodium:SCnc:Pt:Ser/Plas:Qn:: 137

## 2018-11-09 LAB — PROTIME-INR: INR: 1.1

## 2018-11-09 NOTE — Unmapped (Signed)
Malignant Hematology (MDE1) Progress Note    Interval History:  Patient examined this morning. He had no acute events overnight. He has no complaints. Tolerating PO well and having regular bowel movements. Remains stable.     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 Khalid Lacko is a 67 y.o. male with a PMHx of htn, T2DM, CKD3, presented to [facility] with CN aortic valve endocarditis &??monocytic AML, on aza/venetoclax C1D10.    Culture-Negative Aortic Valve Endocarditis:??Patient met sepsis criteria on admission to OSH and source was felt to be multiple dental abscesses seen on CT MaxFac. Multiple sets of blood cx are NG at 5 days, however he was taking oral amoxicillin on admission. HACEK serologies negative. TEE was performed on 11/14 that showed medium-sized (0.6cm x0.9cm) vegetation on the left ventricular aspect of the left coronary cusp; TTE here 11/25 confirmed. S/p broad spectrum abx at OSH from 11/8- 11/21. S/p teeth extraction 11/27.??  -f/u repeat BCx 11/26??and 12/1; NGTD  -IV vanc??dosed per pharmacy (11/22-- ), Ceftriaxone 2g q24h??(11/22-12/2??and 12/4--??), cefepime??(12/2-12/4)  -Plan for four weeks of antibiotics, complete on 12/24. Next vanc trough for: 12/9. Weekly CBC w/diff, CMP, and vanc trough. Labs to be faxed to ID clinic.   -Touch base with ID about outpatient appointment   ??  Fever, resolved: Patient became febrile to 38.4 12/2 AM.??Other VSS. Felt well, no new or localizing symptoms, no focal findings on exam. Patient on dvt ppx, O2 req or leg swelling on exam, UA, CXR unremarkable. BCx redrawn and patient was broadened to vanc/cefepime from vanc/ceftriaxone. Patient is not neutropenic, and last fever was 11/27. No new meds recently. Unclear source currently; differentials include: infection vs leukemia.   -12/1 BCx no growth.   -Continue with vancomycin and ceftriaxone, 4 week course from 11/27 (12/24)   ??  Monocytic AML:??BMBx results 11/22 c/w monocytic AML with 65% blasts, cytogenetics reveal a tri8 and a ???jumping?????3q chromosome. FLT3 negative.??PET/CT 11/26 with diffuse uptake but limited to bone marrow. Using the MRC prognostic model, he should be considered high risk. Started chemo 11/27. TLS/DIC labs improved.  -Aza/venetoclax C1D1 11/27??- 12/4 is C1D8  -Transfusion Thresholds: Hgb <8, PLT <10, fibrinogen <150    ??  Sinus Tachycardia:??Stable. Patient presented with intermittent sinus tach to 110s  . Intermittently febrile but no other signs/sx of infection (see above), no new oxygen requirements, and remains nontoxic appearing. Likely due to his underlying disease process, also in setting of extremely poor PO intake.  ??  Bilateral LE Weakness:??Improved. MRI T/L Spine done at [facility] showed neural foraminal narrowing at L3-4 and L5-S1 (moderate on the right side). Also showed diffusely abnormal bone marrow signal in the spine concerning for infiltrative tumor. No canal narrowing. Strength??at times??symmetric vs R>L (seems limited by pain which is intermittent),??with mild diffuse weakness, though patient able to ambulate.??Question is whether this could be due to CNS component of his leukemia vs deconditioning/pain??in the setting of illness.   -PT/OT, patient  to go to SNF  ??  Delirium: Resolved. Patient was getting agitated and combative when care attempted??at night. Patient notably has underlying brain injury, previous strokes. Resolved with initiation of chemo.??  -Delirium precautions -Zyprexa??2.5 mg qhs  ??  T2DM:??Home regimen is Lantus 42u nightly, empagliflozin 10mg  daily, and metformin 1000mg  BID. Last HbA1c 6.7% in August 2019.??Started on reduced dose of lantus given poor PO.??Will need monitoring and titration up as his appetite improves.   -Home dose of lantus is 42 units nightly. Reduced dosing here due to poor po intake. Pre-meal D/C, and initiating regimen 25 units nightly, will need to titrate accordingly   -continue sliding scale with glucose checks   -Hold home metformin, empagliflozin in patient setting   ??  CKD-III:??Stable.??Creatinine varies from 0.9-1.4. ~1.6 on admission to [facility], improved to w/i baseline subsequently.??Has been slowly increasing, 12/6 Cr is 1.46, continues to remain and improve near baseline.  Continuing to hold home??ACEI and diuretic??given normotension with the norvasc.   -Holding??home??ACEI and HCTZ  ??  Essential Hypertension:??Home regimen is HCTZ 12.5mg  daily, lisinopril 20mg  daily.??Was getting metoprolol tartrate as substitute for HCTZ??given concern for AKI??at OSH,??as well as??diltiazem, unclear reason. Amlodipine favored and was started here. Pressures remain controlled.   -continue amlodipine 10 mg daily  -holding lisinopril and hctz as above  -outpatient follow up with PCP to follow blood pressure  ??    Daily Checklist:  Diet: reg  DVT PPx: scds  GI PPx: none   Electrolytes: Replete PRN  Code Status: DNR and DNI    Dispo: SNF today    Heme/Onc Appointments as below:    12/9: Dr. Orlie Dakin appointment (transfusion support can be done)  12/12: [place]  12/23:  Thera Flake, BMB  12/26: Dr. Vertell Limber     ___________________________________________________________________    Labs/Studies:  Labs and studies from the last 24 hours reviewed.    Objective:  Temp:  [36.2 ??C-37.5 ??C] 37.5 ??C  Heart Rate:  [99-107] 106  Resp:  [18] 18  BP: (108-136)/(57-69) 133/63  SpO2:  [96 %-100 %] 98 %    GEN: NAD, alert, oriented, answers questions appropriately  CV: RRR, S1, S2, no M/R/G RESP: CTAB, no wheezes or crackles  ABD: Normoactive bowel sounds, soft, NTND, no rebound/guarding  EXT: No clubbing, cyanosis, or edema  SKIN:  No rashes or lesions noted    Cherie Dark, DO

## 2018-11-09 NOTE — Unmapped (Signed)
VSS. Disoriented to time & situation. No sliding scale coverage, BG 135. Bed alarm on. Plan to DC to SNF. WCTM.    Problem: Adult Inpatient Plan of Care  Goal: Plan of Care Review  Outcome: Ongoing - Unchanged  Goal: Patient-Specific Goal (Individualization)  Outcome: Ongoing - Unchanged  Goal: Absence of Hospital-Acquired Illness or Injury  Outcome: Ongoing - Unchanged  Goal: Optimal Comfort and Wellbeing  Outcome: Ongoing - Unchanged  Goal: Readiness for Transition of Care  Outcome: Ongoing - Unchanged  Goal: Rounds/Family Conference  Outcome: Ongoing - Unchanged     Problem: Fall Injury Risk  Goal: Absence of Fall and Fall-Related Injury  Outcome: Ongoing - Unchanged     Problem: Self-Care Deficit  Goal: Improved Ability to Complete Activities of Daily Living  Outcome: Ongoing - Unchanged     Problem: Diabetes Comorbidity  Goal: Blood Glucose Level Within Desired Range  Outcome: Ongoing - Unchanged     Problem: Hypertension Comorbidity  Goal: Blood Pressure in Desired Range  Outcome: Ongoing - Unchanged     Problem: Skin Injury Risk Increased  Goal: Skin Health and Integrity  Outcome: Ongoing - Unchanged     Problem: Anemia (Chemotherapy Effects)  Goal: Anemia Symptom Improvement  Outcome: Ongoing - Unchanged     Problem: Urinary Bleeding Risk or Actual (Chemotherapy Effects)  Goal: Absence of Hematuria  Outcome: Ongoing - Unchanged     Problem: Nausea and Vomiting (Chemotherapy Effects)  Goal: Fluid and Electrolyte Balance  Outcome: Ongoing - Unchanged     Problem: Neurotoxicity (Chemotherapy Effects)  Goal: Neurotoxicity Symptom Control  Outcome: Ongoing - Unchanged     Problem: Neutropenia (Chemotherapy Effects)  Goal: Absence of Infection  Outcome: Ongoing - Unchanged     Problem: Oral Mucositis (Chemotherapy Effects)  Goal: Improved Oral Mucous Membrane Integrity  Outcome: Ongoing - Unchanged     Problem: Thrombocytopenia Bleeding Risk (Chemotherapy Effects)  Goal: Absence of Bleeding  Outcome: Ongoing - Unchanged

## 2018-11-09 NOTE — Unmapped (Signed)
VSS. Pt alert, disoriented to time and situation. Incontinent at times, urinal at bedside. ACHS, coverage given. Awaiting 12/6 d/c to SNF.     Problem: Adult Inpatient Plan of Care  Goal: Plan of Care Review  Outcome: Ongoing - Unchanged  Goal: Patient-Specific Goal (Individualization)  Outcome: Ongoing - Unchanged  Goal: Absence of Hospital-Acquired Illness or Injury  Outcome: Ongoing - Unchanged  Goal: Optimal Comfort and Wellbeing  Outcome: Ongoing - Unchanged  Goal: Readiness for Transition of Care  Outcome: Ongoing - Unchanged  Goal: Rounds/Family Conference  Outcome: Ongoing - Unchanged     Problem: Fall Injury Risk  Goal: Absence of Fall and Fall-Related Injury  Outcome: Ongoing - Unchanged     Problem: Self-Care Deficit  Goal: Improved Ability to Complete Activities of Daily Living  Outcome: Ongoing - Unchanged     Problem: Diabetes Comorbidity  Goal: Blood Glucose Level Within Desired Range  Outcome: Ongoing - Unchanged     Problem: Hypertension Comorbidity  Goal: Blood Pressure in Desired Range  Outcome: Ongoing - Unchanged     Problem: Skin Injury Risk Increased  Goal: Skin Health and Integrity  Outcome: Ongoing - Unchanged     Problem: Anemia (Chemotherapy Effects)  Goal: Anemia Symptom Improvement  Outcome: Ongoing - Unchanged     Problem: Urinary Bleeding Risk or Actual (Chemotherapy Effects)  Goal: Absence of Hematuria  Outcome: Ongoing - Unchanged     Problem: Nausea and Vomiting (Chemotherapy Effects)  Goal: Fluid and Electrolyte Balance  Outcome: Ongoing - Unchanged     Problem: Neurotoxicity (Chemotherapy Effects)  Goal: Neurotoxicity Symptom Control  Outcome: Ongoing - Unchanged     Problem: Neutropenia (Chemotherapy Effects)  Goal: Absence of Infection  Outcome: Ongoing - Unchanged     Problem: Oral Mucositis (Chemotherapy Effects)  Goal: Improved Oral Mucous Membrane Integrity  Outcome: Ongoing - Unchanged     Problem: Thrombocytopenia Bleeding Risk (Chemotherapy Effects)  Goal: Absence of Bleeding  Outcome: Ongoing - Unchanged     Problem: Fall Injury Risk  Goal: Absence of Fall and Fall-Related Injury  Outcome: Ongoing - Unchanged     Problem: Diabetes Comorbidity  Goal: Blood Glucose Level Within Desired Range  Outcome: Ongoing - Unchanged

## 2018-11-09 NOTE — Unmapped (Signed)
Hi,     Patient's brother, Maisie Fus contacted the [facility] requesting to speak with the care team of [facility] to discuss:    Stated that his brother was supposed to be discharged from the hospital to a nursing facility [facility] today. [employer] sent over documentation to the patient's provider to approve of this before he can be discharged.     Please contact patient's brother at [phone].    Program: Heme Malignancy  Speciality: Medical Oncology    Check Indicates criteria has been reviewed and confirmed with the patient:    [x]  Preferred Name   [x]  DOB and/or MR#  [x]  Preferred Contact Method  [x]  Phone Number(s)   []  MyChart     Thank you,   Jannette Spanner  [facility]   [phone]

## 2018-11-10 DIAGNOSIS — R531 Weakness: Secondary | ICD-10-CM | POA: Diagnosis not present

## 2018-11-10 DIAGNOSIS — I358 Other nonrheumatic aortic valve disorders: Secondary | ICD-10-CM | POA: Diagnosis not present

## 2018-11-10 DIAGNOSIS — C9300 Acute monoblastic/monocytic leukemia, not having achieved remission: Secondary | ICD-10-CM | POA: Diagnosis not present

## 2018-11-10 DIAGNOSIS — I10 Essential (primary) hypertension: Secondary | ICD-10-CM | POA: Diagnosis not present

## 2018-11-10 DIAGNOSIS — N183 Chronic kidney disease, stage 3 (moderate): Secondary | ICD-10-CM | POA: Diagnosis not present

## 2018-11-10 LAB — HYPOCHROMIA

## 2018-11-10 LAB — COMPREHENSIVE METABOLIC PANEL
ALBUMIN: 2.7 g/dL — ABNORMAL LOW (ref 3.5–5.0)
ALKALINE PHOSPHATASE: 471 U/L — ABNORMAL HIGH (ref 38–126)
ALT (SGPT): 18 U/L (ref <50)
ANION GAP: 4 mmol/L — ABNORMAL LOW (ref 7–15)
AST (SGOT): 17 U/L — ABNORMAL LOW (ref 19–55)
BILIRUBIN TOTAL: 0.5 mg/dL (ref 0.0–1.2)
BLOOD UREA NITROGEN: 19 mg/dL (ref 7–21)
BUN / CREAT RATIO: 12
CALCIUM: 7.7 mg/dL — ABNORMAL LOW (ref 8.5–10.2)
CO2: 27 mmol/L (ref 22.0–30.0)
CREATININE: 1.53 mg/dL — ABNORMAL HIGH (ref 0.70–1.30)
EGFR CKD-EPI AA MALE: 53 mL/min/{1.73_m2} — ABNORMAL LOW (ref >=60)
EGFR CKD-EPI NON-AA MALE: 46 mL/min/{1.73_m2} — ABNORMAL LOW (ref >=60)
GLUCOSE RANDOM: 175 mg/dL (ref 65–179)
POTASSIUM: 4.2 mmol/L (ref 3.5–5.0)
PROTEIN TOTAL: 5.3 g/dL — ABNORMAL LOW (ref 6.5–8.3)

## 2018-11-10 LAB — CBC W/ AUTO DIFF
BASOPHILS ABSOLUTE COUNT: 0 10*9/L (ref 0.0–0.1)
BASOPHILS RELATIVE PERCENT: 0 %
EOSINOPHILS RELATIVE PERCENT: 0.4 %
HEMATOCRIT: 29.5 % — ABNORMAL LOW (ref 41.0–53.0)
HEMOGLOBIN: 9.6 g/dL — ABNORMAL LOW (ref 13.5–17.5)
LARGE UNSTAINED CELLS: 4 % (ref 0–4)
LYMPHOCYTES ABSOLUTE COUNT: 0.4 10*9/L — ABNORMAL LOW (ref 1.5–5.0)
LYMPHOCYTES RELATIVE PERCENT: 69.6 %
MEAN CORPUSCULAR HEMOGLOBIN CONC: 32.7 g/dL (ref 31.0–37.0)
MEAN CORPUSCULAR HEMOGLOBIN: 28.5 pg (ref 26.0–34.0)
MEAN CORPUSCULAR VOLUME: 87.3 fL (ref 80.0–100.0)
MEAN PLATELET VOLUME: 10.7 fL — ABNORMAL HIGH (ref 7.0–10.0)
MONOCYTES ABSOLUTE COUNT: 0 10*9/L — ABNORMAL LOW (ref 0.2–0.8)
NEUTROPHILS RELATIVE PERCENT: 21.5 %
NUCLEATED RED BLOOD CELLS: 3 /100{WBCs} (ref <=4)
PLATELET COUNT: 19 10*9/L — ABNORMAL LOW (ref 150–440)
RED BLOOD CELL COUNT: 3.38 10*12/L — ABNORMAL LOW (ref 4.50–5.90)
RED CELL DISTRIBUTION WIDTH: 15.4 % — ABNORMAL HIGH (ref 12.0–15.0)
WBC ADJUSTED: 0.6 10*9/L — ABNORMAL LOW (ref 4.5–11.0)

## 2018-11-10 LAB — PROTIME: Lab: 12.8

## 2018-11-10 LAB — PROTIME-INR
INR: 1.11
PROTIME: 12.8 s (ref 10.2–13.1)

## 2018-11-10 LAB — AST (SGOT): Aspartate aminotransferase:CCnc:Pt:Ser/Plas:Qn:: 17 — ABNORMAL LOW

## 2018-11-10 LAB — TOXIC GRANULATION

## 2018-11-10 LAB — URIC ACID: Urate:MCnc:Pt:Ser/Plas:Qn:: 2.9 — ABNORMAL LOW

## 2018-11-10 LAB — SLIDE REVIEW

## 2018-11-10 LAB — APTT: Coagulation surface induced:Time:Pt:PPP:Qn:Coag: 27.3

## 2018-11-10 LAB — D-DIMER QUANTITATIVE (CH,ML,PD,ET): Lab: 8483 — ABNORMAL HIGH

## 2018-11-10 LAB — FIBRINOGEN LEVEL: Lab: 593 — ABNORMAL HIGH

## 2018-11-10 LAB — PHOSPHORUS: Phosphate:MCnc:Pt:Ser/Plas:Qn:: 3.6

## 2018-11-10 NOTE — Unmapped (Signed)
AOC Triage Note     Patient: [facility]     Reason for call:  return call    Time call returned: 1640     Phone Assessment: Spoke with Maisie Fus     Triage Recommendations: Informed him that he will need to reach out to patients inpatient team to complete the needed documentation.     Patient Response: He verbalized understanding     Outstanding tasks: Care team notification no further actions needed      Patient Pharmacy has been verified and primary pharmacy has been marked as preferred

## 2018-11-10 NOTE — Unmapped (Signed)
VSS. Pt ambulated around unit with PT, remained free of falls and injury. Pt alert, disoriented to time and situation, bed alarm activated and audible. ACHS, coverage given. Awaiting d/c to SNF pending insurance approval.     Problem: Adult Inpatient Plan of Care  Goal: Plan of Care Review  Outcome: Progressing     Problem: Adult Inpatient Plan of Care  Goal: Patient-Specific Goal (Individualization)  Outcome: Progressing     Problem: Adult Inpatient Plan of Care  Goal: Optimal Comfort and Wellbeing  Outcome: Progressing     Problem: Adult Inpatient Plan of Care  Goal: Readiness for Transition of Care  Outcome: Progressing     Problem: Fall Injury Risk  Goal: Absence of Fall and Fall-Related Injury  Outcome: Progressing

## 2018-11-10 NOTE — Unmapped (Signed)
Malignant Hematology (MDE1) Progress Note    Interval History:  [facility] is feeling well this morning. No acute events overnight. He has no complaints this morning. He is still not taking much PO, having reportedly only had a donut yesterday. His ANC is down to 0.1 today, so will start antifungal and antibacterial prophylaxis. Awaiting insurance authorization for SNF. Medically ready for d/c.    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 [facility] is a 67 y.o. male with a PMHx of htn, T2DM, CKD3, presented to [facility] with CN aortic valve endocarditis &??monocytic AML, on aza/venetoclax C1D10.    Culture-Negative Aortic Valve Endocarditis:??Patient met sepsis criteria on admission to OSH and source was felt to be multiple dental abscesses seen on CT MaxFac. Multiple sets of blood cx are NG at 5 days, however he was taking oral amoxicillin on admission. HACEK serologies negative. TEE was performed on 11/14 that showed medium-sized (0.6cm x0.9cm) vegetation on the left ventricular aspect of the left coronary cusp; TTE here 11/25 confirmed. S/p broad spectrum abx at OSH from 11/8- 11/21. S/p teeth extraction 11/27.??  -f/u repeat BCx 11/26??and 12/1; NGTD  -IV vanc??dosed per pharmacy (11/22-- ), Ceftriaxone 2g q24h??(11/22-12/2??and 12/4--??), cefepime??(12/2-12/4)  -Plan for four weeks of antibiotics, complete on 12/24. Next vanc trough for: 12/9. Weekly CBC w/diff, CMP, and vanc trough. Labs to be faxed to ID clinic.   -ID appointment on 12/24.  ??  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 FLT3 negative.??PET/CT 11/26 with diffuse uptake but limited to bone marrow. Using the MRC prognostic model, he should be considered high risk. Started chemo 11/27. TLS/DIC labs improved. As of 12/5 he became neutropenic. Prophylaxis started.  -Aza/venetoclax C1D1 11/27??- 12/7 is C1D11  -Transfusion Thresholds: Hgb <8, PLT <10, fibrinogen <150  -PPx: valtrex, levaquin. Will watch counts to decide antifungal  ??  Sinus Tachycardia:??Stable. He has been tachy throughout most of the hospitalization. It is NSR, he has shown some improvement with fluids and treatment of his disease. He is stable.  ??  Bilateral LE Weakness:??Improved. MRI T/L Spine done at [facility] showed neural foraminal narrowing at L3-4 and L5-S1 (moderate on the right side). Also showed diffusely abnormal bone marrow signal in the spine concerning for infiltrative tumor. No canal narrowing. Strength??at times??symmetric vs R>L (seems limited by pain which is intermittent),??with mild diffuse weakness, though patient able to ambulate.??Question is whether this could be due to CNS component of his leukemia vs deconditioning/pain??in the setting of illness.   -PT/OT, patient to go to SNF  ??  Delirium: Resolved. Patient was getting agitated and combative when care attempted??at night. Patient notably has underlying brain injury, previous strokes. Resolved with initiation of chemo.??  -Delirium precautions   -Zyprexa??2.5 mg qhs  ??  T2DM:??Home regimen is Lantus 42u nightly, empagliflozin 10mg  daily, and metformin 1000mg  BID. Last HbA1c 6.7% in August 2019.??Started on reduced dose  of lantus given poor PO.??He has required mealtime dosing insulin as well. He continues to have poor PO and AM glucose was 69 on only lantus 25u qHS. We will decrease his lantus and with the mealtime spikes we are seeing, will put him back on mealtime insulin.  -Lantus 20U qHS  -Lispro mealtime 5U qAC  -continue sliding scale with glucose checks   -Hold home metformin, empagliflozin in patient setting   ??  CKD-III:??Stable.??Creatinine varies from 0.9-1.4. ~1.6 on admission to [facility], improved to w/i baseline subsequently.??Has been slowly increasing, 12/7 Cr is 1.53, continues to remain and improve near baseline.  Continuing to hold home??ACEI and diuretic??given normotension with the norvasc.   -Holding??home??ACEI and HCTZ  ??  Essential Hypertension:??Home regimen is HCTZ 12.5mg  daily, lisinopril 20mg  daily.??Was getting metoprolol tartrate as substitute for HCTZ??given concern for AKI??at OSH,??as well as??diltiazem, unclear reason. Amlodipine favored and was started here. Pressures remain controlled.   -continue amlodipine 10 mg daily  -holding lisinopril and hctz as above  -outpatient follow up with PCP to follow blood pressure    Daily Checklist:  Diet: reg  DVT PPx: scds  GI PPx: none   Electrolytes: Replete PRN  Code Status: DNR and DNI    Dispo: SNF today    Heme/Onc Appointments as below:  12/9: [facility] appointment (transfusion support can be done) - will address if insurance auth not in place by this time for SNF.  12/12: [facility]  12/23Thera [facility], BMB  12/26: [facility]   ___________________________________________________________________    Labs/Studies:  Labs and studies from the last 24 hours reviewed.    Objective:  Temp:  [36.3 ??C-37.8 ??C] 36.8 ??C  Heart Rate:  [103-112] 112  Resp:  [18] 18  BP: (120-139)/(62-77) 139/67  SpO2:  [96 %-99 %] 99 %    GEN: NAD, alert, oriented, answers questions appropriately  CV: RRR, S1, S2, no M/R/G  RESP: CTAB, no wheezes or crackles  ABD: Normoactive bowel sounds, soft, NTND, no rebound/guarding  EXT: No clubbing, cyanosis, or edema  SKIN:  No rashes or lesions noted    Kyle Reveal, MD

## 2018-11-10 NOTE — Unmapped (Signed)
Plan of care in place,  no change in status overnight;   labs drawn this AM,  pt UOP good,  he has been using the urinal;  denied need for prn or pain meds;   rested well through the night; remains free of falls/injury,  safety measures in place, discussed with pt.  Bed alarm on, frequent monitoring  Problem: Adult Inpatient Plan of Care  Goal: Plan of Care Review  Outcome: Progressing  Goal: Patient-Specific Goal (Individualization)  Outcome: Progressing  Goal: Absence of Hospital-Acquired Illness or Injury  Outcome: Progressing  Goal: Optimal Comfort and Wellbeing  Outcome: Progressing  Goal: Readiness for Transition of Care  Outcome: Progressing  Goal: Rounds/Family Conference  Outcome: Progressing     Problem: Fall Injury Risk  Goal: Absence of Fall and Fall-Related Injury  Outcome: Progressing     Problem: Self-Care Deficit  Goal: Improved Ability to Complete Activities of Daily Living  Outcome: Progressing     Problem: Diabetes Comorbidity  Goal: Blood Glucose Level Within Desired Range  Outcome: Progressing     Problem: Hypertension Comorbidity  Goal: Blood Pressure in Desired Range  Outcome: Progressing     Problem: Skin Injury Risk Increased  Goal: Skin Health and Integrity  Outcome: Progressing     Problem: Anemia (Chemotherapy Effects)  Goal: Anemia Symptom Improvement  Outcome: Progressing     Problem: Urinary Bleeding Risk or Actual (Chemotherapy Effects)  Goal: Absence of Hematuria  Outcome: Progressing     Problem: Nausea and Vomiting (Chemotherapy Effects)  Goal: Fluid and Electrolyte Balance  Outcome: Progressing     Problem: Neurotoxicity (Chemotherapy Effects)  Goal: Neurotoxicity Symptom Control  Outcome: Progressing     Problem: Neutropenia (Chemotherapy Effects)  Goal: Absence of Infection  Outcome: Progressing     Problem: Oral Mucositis (Chemotherapy Effects)  Goal: Improved Oral Mucous Membrane Integrity  Outcome: Progressing     Problem: Thrombocytopenia Bleeding Risk (Chemotherapy Effects) Goal: Absence of Bleeding  Outcome: Progressing

## 2018-11-11 DIAGNOSIS — C9300 Acute monoblastic/monocytic leukemia, not having achieved remission: Secondary | ICD-10-CM | POA: Diagnosis not present

## 2018-11-11 DIAGNOSIS — E119 Type 2 diabetes mellitus without complications: Secondary | ICD-10-CM | POA: Diagnosis not present

## 2018-11-11 DIAGNOSIS — R531 Weakness: Secondary | ICD-10-CM | POA: Diagnosis not present

## 2018-11-11 DIAGNOSIS — I358 Other nonrheumatic aortic valve disorders: Secondary | ICD-10-CM | POA: Diagnosis not present

## 2018-11-11 DIAGNOSIS — I10 Essential (primary) hypertension: Secondary | ICD-10-CM | POA: Diagnosis not present

## 2018-11-11 DIAGNOSIS — N183 Chronic kidney disease, stage 3 (moderate): Secondary | ICD-10-CM | POA: Diagnosis not present

## 2018-11-11 LAB — D-DIMER QUANTITATIVE (CH,ML,PD,ET): Lab: 8279 — ABNORMAL HIGH

## 2018-11-11 LAB — CBC W/ AUTO DIFF
BASOPHILS ABSOLUTE COUNT: 0 10*9/L (ref 0.0–0.1)
EOSINOPHILS ABSOLUTE COUNT: 0 10*9/L (ref 0.0–0.4)
EOSINOPHILS RELATIVE PERCENT: 0.2 %
HEMOGLOBIN: 9.3 g/dL — ABNORMAL LOW (ref 13.5–17.5)
LARGE UNSTAINED CELLS: 7 % — ABNORMAL HIGH (ref 0–4)
LYMPHOCYTES ABSOLUTE COUNT: 0.4 10*9/L — ABNORMAL LOW (ref 1.5–5.0)
LYMPHOCYTES RELATIVE PERCENT: 73.9 %
MEAN CORPUSCULAR HEMOGLOBIN CONC: 32.4 g/dL (ref 31.0–37.0)
MEAN CORPUSCULAR HEMOGLOBIN: 27.8 pg (ref 26.0–34.0)
MEAN CORPUSCULAR VOLUME: 85.7 fL (ref 80.0–100.0)
MEAN PLATELET VOLUME: 11.9 fL — ABNORMAL HIGH (ref 7.0–10.0)
MONOCYTES ABSOLUTE COUNT: 0 10*9/L — ABNORMAL LOW (ref 0.2–0.8)
MONOCYTES RELATIVE PERCENT: 3.3 %
NEUTROPHILS ABSOLUTE COUNT: 0.1 10*9/L — CL (ref 2.0–7.5)
NEUTROPHILS RELATIVE PERCENT: 15.1 %
PLATELET COUNT: 12 10*9/L — ABNORMAL LOW (ref 150–440)
RED BLOOD CELL COUNT: 3.34 10*12/L — ABNORMAL LOW (ref 4.50–5.90)
RED CELL DISTRIBUTION WIDTH: 15.5 % — ABNORMAL HIGH (ref 12.0–15.0)
WBC ADJUSTED: 0.6 10*9/L — ABNORMAL LOW (ref 4.5–11.0)

## 2018-11-11 LAB — COMPREHENSIVE METABOLIC PANEL
ALBUMIN: 2.7 g/dL — ABNORMAL LOW (ref 3.5–5.0)
ALKALINE PHOSPHATASE: 482 U/L — ABNORMAL HIGH (ref 38–126)
ALT (SGPT): 16 U/L (ref <50)
ANION GAP: 7 mmol/L (ref 7–15)
AST (SGOT): 19 U/L (ref 19–55)
BILIRUBIN TOTAL: 0.6 mg/dL (ref 0.0–1.2)
BLOOD UREA NITROGEN: 17 mg/dL (ref 7–21)
BUN / CREAT RATIO: 13
CALCIUM: 7.7 mg/dL — ABNORMAL LOW (ref 8.5–10.2)
CO2: 26 mmol/L (ref 22.0–30.0)
CREATININE: 1.35 mg/dL — ABNORMAL HIGH (ref 0.70–1.30)
EGFR CKD-EPI AA MALE: 62 mL/min/{1.73_m2} (ref >=60)
EGFR CKD-EPI NON-AA MALE: 54 mL/min/{1.73_m2} — ABNORMAL LOW (ref >=60)
GLUCOSE RANDOM: 74 mg/dL (ref 65–179)
POTASSIUM: 4.2 mmol/L (ref 3.5–5.0)
PROTEIN TOTAL: 5.5 g/dL — ABNORMAL LOW (ref 6.5–8.3)
SODIUM: 137 mmol/L (ref 135–145)

## 2018-11-11 LAB — APTT: Coagulation surface induced:Time:Pt:PPP:Qn:Coag: 27.8

## 2018-11-11 LAB — PROTEIN TOTAL: Protein:MCnc:Pt:Ser/Plas:Qn:: 5.5 — ABNORMAL LOW

## 2018-11-11 LAB — URIC ACID: Urate:MCnc:Pt:Ser/Plas:Qn:: 2.7 — ABNORMAL LOW

## 2018-11-11 LAB — SMEAR REVIEW

## 2018-11-11 LAB — FIBRINOGEN LEVEL: Lab: 593 — ABNORMAL HIGH

## 2018-11-11 LAB — INR: Lab: 1.2

## 2018-11-11 LAB — PHOSPHORUS: Phosphate:MCnc:Pt:Ser/Plas:Qn:: 3.9

## 2018-11-11 LAB — NEUTROPHILS RELATIVE PERCENT: Lab: 15.1

## 2018-11-11 NOTE — Progress Notes (Deleted)
Richard Gordon  [phone] [phone]  ID: Richard Gordon OB: 02-13-1951  MR#: [id number]  DGU#:[id number]  Patient Care Team: Leone Haven, MD as PCP - General (Family Medicine)  CHIEF COMPLAINT: Thrombocytopenia  INTERVAL HISTORY: Patient returns to clinic today for repeat laboratory work and routine evaluation.  He continues to feel well and remains asymptomatic.  He denies any easy bleeding or bruising.  He has no neurologic complaints.  He denies any recent fevers or illnesses. He has a good appetite and denies weight loss.  He has no chest pain or shortness of breath.  He denies any nausea, vomiting, constipation, or diarrhea.  He has no urinary complaints.  Patient offers no specific complaints today.  REVIEW OF SYSTEMS:   Review of Systems  Constitutional: Negative.  Negative for fever, malaise/fatigue and weight loss.  Respiratory: Negative.  Negative for cough and shortness of breath.   Cardiovascular: Negative.  Negative for chest pain and leg swelling.  Gastrointestinal: Negative.  Negative for abdominal pain and constipation.  Genitourinary: Negative.  Negative for dysuria.  Musculoskeletal: Negative.   Skin: Negative.  Negative for rash.  Neurological: Negative.  Negative for sensory change, focal weakness and weakness.  Psychiatric/Behavioral: Negative.  The patient is not nervous/anxious.    As per HPI. Otherwise, a complete review of systems is negative.  PAST MEDICAL HISTORY: Past Medical History:  Diagnosis Date  . Chronic kidney disease (CKD), stage III (moderate) ([place]) 11/03/2015  . Diabetes mellitus without complication ([place])   . Hyperlipidemia   . Hypertension   . Stroke [facility])    per pt 2 CVA in 2000,and 1998 (  R ,then L side effected seperately per pt )    PAST SURGICAL HISTORY: Past Surgical History:  Procedure Laterality Date  . CATARACT EXTRACTION    . TEE WITHOUT CARDIOVERSION N/A 10/18/2018   Procedure:  TRANSESOPHAGEAL ECHOCARDIOGRAM (TEE);  Surgeon: Teodoro Spray, MD;  Location: [facility] ORS;  Service: Cardiovascular;  Laterality: N/A;  . TONSILLECTOMY      FAMILY HISTORY: Family History  Problem Relation Age of Onset  . Hypertension Father   . Diabetes Father   . Brain cancer Mother        per pt he was 99 and thinks this was cancer   . Coronary artery disease Sister        scarlet fever as child affected heart per pt    ADVANCED DIRECTIVES (Y/N):  N  HEALTH MAINTENANCE: Social History   Tobacco Use  . Smoking status: Former Smoker    Packs/day: 0.50    Years: 25.00    Pack years: 12.50    Types: Cigarettes    Last attempt to quit: 04/04/1999    Years since quitting: 19.6  . Smokeless tobacco: Former Systems developer    Types: Snuff  Substance Use Topics  . Alcohol use: No    Alcohol/week: 0.0 standard drinks  . Drug use: No     Colonoscopy:  PAP:  Bone density:  Lipid panel:  No Known Allergies  No current outpatient medications on file.   No current facility-administered medications for this visit.     OBJECTIVE: There were no vitals filed for this visit.   There is no height or weight on file to calculate BMI.    ECOG FS:0 - Asymptomatic  General: Well-developed, well-nourished, no acute distress. Eyes: Pink conjunctiva, anicteric sclera. HEENT: Normocephalic, moist mucous membranes. Lungs: Clear to auscultation bilaterally. Heart: Regular rate and rhythm. No  rubs, murmurs, or gallops. Abdomen: Soft, nontender, nondistended. No organomegaly noted, normoactive bowel sounds. Musculoskeletal: No edema, cyanosis, or clubbing. Neuro: Alert, answering all questions appropriately. Cranial nerves grossly intact. Skin: No rashes or petechiae noted. Psych: Normal affect.  LAB RESULTS:  Lab Results  Component Value Date   NA 134 (L) 10/25/2018   K 3.9 10/25/2018   CL 102 10/25/2018   CO2 25 10/25/2018   GLUCOSE 208 (H) 10/25/2018   BUN 19 10/25/2018   CREATININE  1.58 (H) 10/25/2018   CALCIUM 7.6 (L) 10/25/2018   PROT 5.7 (L) 10/20/2018   ALBUMIN 2.4 (L) 10/20/2018   AST 16 10/20/2018   ALT 14 10/20/2018   ALKPHOS 216 (H) 10/20/2018   BILITOT 0.8 10/20/2018   GFRNONAA 43 (L) 10/25/2018   GFRAA 50 (L) 10/25/2018    Lab Results  Component Value Date   WBC 22.2 (H) 10/25/2018   NEUTROABS 12.0 (H) 10/23/2018   HGB 8.3 (L) 10/25/2018   HCT 26.6 (L) 10/25/2018   MCV 93.0 10/25/2018   PLT 186 10/25/2018     STUDIES: Ct Head Wo Contrast  Result Date: 10/22/2018 CLINICAL DATA:  67 year old male with altered level of consciousness. Subsequent encounter. EXAM: CT HEAD WITHOUT CONTRAST TECHNIQUE: Contiguous axial images were obtained from the base of the skull through the vertex without intravenous contrast. COMPARISON:  10/19/2018 MR. 10/11/2018 and 06/21/2011 CT. FINDINGS: Brain: No intracranial hemorrhage or CT evidence of large acute infarct. Remote left parietal lobe infarct. Marked cerebellar and brainstem atrophy (may represent result of neurodegenerative disorder). Moderate supratentorial atrophy. 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 No intracranial mass lesion noted on this unenhanced exam. Vascular: Vascular calcifications Skull: No acute abnormality Sinuses/Orbits: No acute orbital abnormality. Mild mucosal thickening sphenoid sinuses. Other: Visualized mastoid air cells and middle ear cavities are clear. IMPRESSION: 1. No intracranial hemorrhage or CT evidence of large acute infarct. 2. Remote left parietal lobe infarct. 3. Marked cerebellar and brainstem atrophy (may represent result of neurodegenerative disorder). Moderate supratentorial atrophy. 4. 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 5. Mild mucosal thickening sphenoid sinuses. Electronically Signed   By: Genia Del M.D.   On:  10/22/2018 17:45   Ct Chest Wo Contrast  Result Date: 10/20/2018 CLINICAL DATA:  Cough, fever. Possible endocarditis on TEE. Abnormal marrow signal on thoracolumbar spine MRI raises concern for malignancy. EXAM: CT CHEST WITHOUT CONTRAST TECHNIQUE: Multidetector CT imaging of the chest was performed following the standard protocol without IV contrast. COMPARISON:  Correlation with thoracolumbar spine MRI dated 10/19/2018 and recent CT abdomen/pelvis dated 10/10/2018. FINDINGS: Cardiovascular: The heart is normal in size. No pericardial effusion. No evidence of thoracic aortic aneurysm. Atherosclerotic calcifications of the aortic arch. Three vessel coronary atherosclerosis. Mediastinum/Nodes: No suspicious mediastinal or axillary lymphadenopathy. 7 mm short axis azygoesophageal recess node (series 2/image 80), within normal limits, previously 9 mm. Visualized thyroid is unremarkable. Lungs/Pleura: Lungs are essentially clear. Mild dependent atelectasis in the bilateral lower lobes. No focal consolidation. No pleural effusion or pneumothorax. Upper Abdomen: Visualized upper abdomen is notable for mild stranding along the bilateral adrenal glands and kidneys, possibly improved from recent CT. Musculoskeletal: Diffuse/heterogeneous osseous sclerosis involving the visualized axial and appendicular skeleton including the thoracolumbar spine and sternum. IMPRESSION: Diffuse/heterogeneous osseous sclerosis involving the visualized axial and appendicular skeleton. Differential considerations include renal osteodystrophy, diffuse osseous metastases secondary to malignancy (most commonly prostate cancer), or  infiltrative disorders including lymphoma. Otherwise, no findings suspicious for malignancy in the chest. Aortic Atherosclerosis (ICD10-I70.0). Electronically Signed   By: Julian Hy M.D.   On: 10/20/2018 10:15   Mr Brain Wo Contrast  Result Date: 10/19/2018 CLINICAL DATA:  Progressive leg weakness,  altered mental status. Sepsis. History of stroke, hypertension, hyperlipidemia, diabetes. EXAM: MRI HEAD WITHOUT CONTRAST TECHNIQUE: Multiplanar, multiecho pulse sequences of the brain and surrounding structures were obtained without intravenous contrast. COMPARISON:  CT face October 11, 2018, MRI head March 14, 2011 FINDINGS: INTRACRANIAL CONTENTS: No reduced diffusion to suggest acute ischemia. No susceptibility artifact to suggest hemorrhage. Disproportionate severely atrophic cerebellum similar to progressed. Moderately atrophic pons. No hydrocephalus. LEFT inferior frontal lobe encephalomalacia, given enlargement of LEFT frontal sinuses most compatible with neonatal insult. Old RIGHT thalamus infarct. Old LEFT parietal infarct with mild ex vacuo dilatation subjacent ventricle. No hydrocephalus. No midline shift, mass effect or masses. No abnormal extra-axial fluid collections. VASCULAR: Normal major intracranial vascular flow voids present at skull base. SKULL AND UPPER CERVICAL SPINE: No abnormal sellar expansion. No suspicious calvarial bone marrow signal. Craniocervical junction maintained. SINUSES/ORBITS: Mild paranasal sinus mucosal thickening without air-fluid levels. Mastoid air cells are well aerated.The included ocular globes and orbital contents are non-suspicious. Status post RIGHT ocular lens implant. OTHER: None. IMPRESSION: 1. No acute intracranial process. 2. 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 3. Old small LEFT parietal/MCA territory infarct. Old RIGHT thalamus infarct. 4. 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 Electronically Signed   By: Elon Alas M.D.   On: 10/19/2018 05:13   Mr Thoracic Spine W Wo Contrast  Result Date: 10/19/2018 CLINICAL DATA:  Mid and lower back pain, leg weakness. Sepsis. Evaluate for spinal infection. History of endocarditis, diabetes. EXAM: MRI THORACIC AND LUMBAR SPINE  WITHOUT AND WITH CONTRAST TECHNIQUE: Multiplanar and multiecho pulse sequences of the thoracic and lumbar spine were obtained without and with intravenous contrast. CONTRAST:  7 cc Gadavist COMPARISON:  CT abdomen and pelvis October 10, 2018 FINDINGS: MRI THORACIC SPINE FINDINGS ALIGNMENT: Maintenance of the thoracic kyphosis. No malalignment. VERTEBRAE/DISCS: Vertebral bodies are intact. Diffusely heterogeneous bone marrow signal with patchy bright STIR signal in superimposed irregular enhancement of nearly all thoracic vertebral bodies. Preservation of the cortex. Intervertebral disc morphologies and signal maintained. No abnormal disc enhancement. CORD: Thoracic spinal cord is normal morphology and signal characteristics. No abnormal cord or leptomeningeal enhancement. Epidural enhancement within the included cervical spine. Minimal ventral epidural enhancement versus failure of fat saturation. PREVERTEBRAL AND PARASPINAL SOFT TISSUES:  Nonacute. DISC LEVELS: No disc bulge, canal stenosis nor neural foraminal narrowing. MRI LUMBAR SPINE FINDINGS SEGMENTATION: For the purposes of this report, the last well-formed intervertebral disc is reported as L5-S1. ALIGNMENT: Maintained lumbar lordosis. No malalignment. VERTEBRAE: Vertebral bodies are intact. Diffusely heterogeneous bone marrow signal with patchy bright STIR signal in superimposed irregular enhancement of all lumbar vertebral bodies and included sacrum. Preservation of the cortex. Intervertebral disc morphologies and signal maintained. No abnormal disc enhancement. Congenital canal narrowing on the basis of foreshortened pedicles. CONUS MEDULLARIS AND CAUDA EQUINA: Conus medullaris terminates at L1-2 and demonstrates normal morphology and signal characteristics. Cauda equina is normal. No abnormal cord, leptomeningeal or epidural enhancement. PARASPINAL AND OTHER SOFT TISSUES: Mild prevertebral fat stranding without focal fluid collection or mass. Low-grade  paraspinal muscle strain DISC LEVELS: L1-2: Annular bulging enhancing annular fissure. No canal stenosis or neural foraminal narrowing. L2-3: No disc bulge, canal stenosis nor neural foraminal narrowing.  L3-4: Annular bulging, small LEFT subarticular and RIGHT extraforaminal disc protrusions. No canal stenosis. Moderate RIGHT and mild LEFT neural foraminal narrowing. L4-5: Moderate broad-based LEFT subarticular disc protrusion. Small broad-based disc bulge, enhancing annular fissures. Mild canal stenosis, narrowed LEFT lateral recess which may affect the traversing LEFT L5 nerve. Mild LEFT neural foraminal narrowing. L5-S1: Small central disc protrusion without canal stenosis. Mild neural foraminal narrowing. IMPRESSION: 1. Diffusely abnormal bone marrow signal within the thoracolumbar and included sacral spine with heterogeneous enhancement highly concerning for infiltrative tumor (infiltrative metastatic disease versus myeloproliferative disorder or leukemia/lymphoma). No pathologic fracture. Atypical appearance for infection. 2. Faint epidural enhancement, finding seen with venous congestion, less likely infection. 3. No thoracolumbar canal stenosis. Neural foraminal narrowing L3-4 through L5-S1: Moderate on the RIGHT at L3-4. Electronically Signed   By: Elon Alas M.D.   On: 10/19/2018 05:40   Mr Lumbar Spine W Wo Contrast  Result Date: 10/19/2018 CLINICAL DATA:  Mid and lower back pain, leg weakness. Sepsis. Evaluate for spinal infection. History of endocarditis, diabetes. EXAM: MRI THORACIC AND LUMBAR SPINE WITHOUT AND WITH CONTRAST TECHNIQUE: Multiplanar and multiecho pulse sequences of the thoracic and lumbar spine were obtained without and with intravenous contrast. CONTRAST:  7 cc Gadavist COMPARISON:  CT abdomen and pelvis October 10, 2018 FINDINGS: MRI THORACIC SPINE FINDINGS ALIGNMENT: Maintenance of the thoracic kyphosis. No malalignment. VERTEBRAE/DISCS: Vertebral bodies are intact.  Diffusely heterogeneous bone marrow signal with patchy bright STIR signal in superimposed irregular enhancement of nearly all thoracic vertebral bodies. Preservation of the cortex. Intervertebral disc morphologies and signal maintained. No abnormal disc enhancement. CORD: Thoracic spinal cord is normal morphology and signal characteristics. No abnormal cord or leptomeningeal enhancement. Epidural enhancement within the included cervical spine. Minimal ventral epidural enhancement versus failure of fat saturation. PREVERTEBRAL AND PARASPINAL SOFT TISSUES:  Nonacute. DISC LEVELS: No disc bulge, canal stenosis nor neural foraminal narrowing. MRI LUMBAR SPINE FINDINGS SEGMENTATION: For the purposes of this report, the last well-formed intervertebral disc is reported as L5-S1. ALIGNMENT: Maintained lumbar lordosis. No malalignment. VERTEBRAE: Vertebral bodies are intact. Diffusely heterogeneous bone marrow signal with patchy bright STIR signal in superimposed irregular enhancement of all lumbar vertebral bodies and included sacrum. Preservation of the cortex. Intervertebral disc morphologies and signal maintained. No abnormal disc enhancement. Congenital canal narrowing on the basis of foreshortened pedicles. CONUS MEDULLARIS AND CAUDA EQUINA: Conus medullaris terminates at L1-2 and demonstrates normal morphology and signal characteristics. Cauda equina is normal. No abnormal cord, leptomeningeal or epidural enhancement. PARASPINAL AND OTHER SOFT TISSUES: Mild prevertebral fat stranding without focal fluid collection or mass. Low-grade paraspinal muscle strain DISC LEVELS: L1-2: Annular bulging enhancing annular fissure. No canal stenosis or neural foraminal narrowing. L2-3: No disc bulge, canal stenosis nor neural foraminal narrowing. L3-4: Annular bulging, small LEFT subarticular and RIGHT extraforaminal disc protrusions. No canal stenosis. Moderate RIGHT and mild LEFT neural foraminal narrowing. L4-5: Moderate  broad-based LEFT subarticular disc protrusion. Small broad-based disc bulge, enhancing annular fissures. Mild canal stenosis, narrowed LEFT lateral recess which may affect the traversing LEFT L5 nerve. Mild LEFT neural foraminal narrowing. L5-S1: Small central disc protrusion without canal stenosis. Mild neural foraminal narrowing. IMPRESSION: 1. Diffusely abnormal bone marrow signal within the thoracolumbar and included sacral spine with heterogeneous enhancement highly concerning for infiltrative tumor (infiltrative metastatic disease versus myeloproliferative disorder or leukemia/lymphoma). No pathologic fracture. Atypical appearance for infection. 2. Faint epidural enhancement, finding seen with venous congestion, less likely infection. 3. No thoracolumbar canal stenosis. Neural foraminal narrowing L3-4  through L5-S1: Moderate on the RIGHT at L3-4. Electronically Signed   By: Elon Alas M.D.   On: 10/19/2018 05:40   Dg Chest Portable 1 View  Result Date: 10/16/2018 CLINICAL DATA:  Generalized body aches and weakness, possible sepsis EXAM: PORTABLE CHEST 1 VIEW COMPARISON:  10/10/2018 FINDINGS: Cardiac shadow is within normal limits. The lungs are well aerated bilaterally. No focal infiltrate or sizable effusion is seen. No acute bony abnormality is noted. IMPRESSION: No acute abnormality seen. Electronically Signed   By: Inez Catalina M.D.   On: 10/16/2018 16:00   Ct Bone Marrow Biopsy & Aspiration  Result Date: 10/23/2018 INDICATION: Diffuse infiltrative marrow process. Please perform CT-guided bone marrow biopsy for tissue diagnostic purposes. EXAM: CT-GUIDED BONE MARROW BIOPSY AND ASPIRATION MEDICATIONS: None ANESTHESIA/SEDATION: Fentanyl 25 mcg IV; Versed 1 mg IV Sedation Time: 11 Minutes; The patient was continuously monitored during the procedure by the interventional radiology nurse under my direct supervision. COMPLICATIONS: None immediate. PROCEDURE: Informed consent was obtained from  the patient following an explanation of the procedure, risks, benefits and alternatives. The patient understands, agrees and consents for the procedure. All questions were addressed. A time out was performed prior to the initiation of the procedure. The patient was positioned prone and non-contrast localization CT was performed of the pelvis to demonstrate the iliac marrow spaces. The operative site was prepped and draped in the usual sterile fashion. Under sterile conditions and local anesthesia, a 22 gauge spinal needle was utilized for procedural planning. Next, an 11 gauge coaxial bone biopsy needle was advanced into the right iliac marrow space. Note, the right marrow space was selected for but biopsy given its more pronounced infiltrative appearance. Needle position was confirmed with CT imaging. Initially, bone marrow aspiration was attempted however no marrow was able to be aspirated. Next, a bone marrow biopsy was obtained with the 11 gauge outer bone marrow device. The 11 gauge coaxial bone biopsy needle was re-advanced into a slightly different location within the left iliac marrow space, appropriate position was again confirmed however again, no marrow was able to be aspirated. An additional bone marrow biopsy was obtained. Samples were prepared with the cytotechnologist and deemed adequate. The needle was removed intact. Hemostasis was obtained with compression and a dressing was placed. The patient tolerated the procedure well without immediate post procedural complication. IMPRESSION: Successful CT guided left iliac bone marrow core biopsy. No marrow was able to be aspirated from either bone marrow biopsy sites. Electronically Signed   By: Sandi Mariscal M.D.   On: 10/23/2018 10:11   [place] Ekg Site Rite  Result Date: 10/20/2018 If Site Rite image not attached, placement could not be confirmed due to current cardiac rhythm.   ASSESSMENT: Thrombocytopenia  PLAN:    1. Thrombocytopenia: Patient's  platelet count remains persistently decreased at 114, but essentially stable.  Previously,all of his other laboratory work including iron stores, folate, B12, and SPEP were negative or within normal limits.  Platelet antibodies are also negative.  Patient may have ITP, but this would take a bone marrow biopsy to diagnose which is unnecessary at this point.  No intervention is needed.  Return to clinic in 6 months with repeat laboratory work and further evaluation.  I spent a total of 20 minutes face-to-face with the patient of which greater than 50% of the visit was spent in counseling and coordination of care as detailed above.   Patient expressed understanding and was in agreement with this plan. He also understands  that He can call clinic at any time with any questions, concerns, or complaints.    Lloyd Huger, MD   11/11/2018 10:56 PM

## 2018-11-11 NOTE — Unmapped (Signed)
VSS. Pt remained free of falls and injury. Pt continent throughout shift. Awaiting SNF placement.     Problem: Adult Inpatient Plan of Care  Goal: Plan of Care Review  Outcome: Ongoing - Unchanged  Goal: Patient-Specific Goal (Individualization)  Outcome: Ongoing - Unchanged  Goal: Absence of Hospital-Acquired Illness or Injury  Outcome: Ongoing - Unchanged  Goal: Optimal Comfort and Wellbeing  Outcome: Ongoing - Unchanged  Goal: Readiness for Transition of Care  Outcome: Ongoing - Unchanged  Goal: Rounds/Family Conference  Outcome: Ongoing - Unchanged     Problem: Fall Injury Risk  Goal: Absence of Fall and Fall-Related Injury  Outcome: Ongoing - Unchanged     Problem: Self-Care Deficit  Goal: Improved Ability to Complete Activities of Daily Living  Outcome: Ongoing - Unchanged     Problem: Diabetes Comorbidity  Goal: Blood Glucose Level Within Desired Range  Outcome: Ongoing - Unchanged     Problem: Hypertension Comorbidity  Goal: Blood Pressure in Desired Range  Outcome: Ongoing - Unchanged     Problem: Skin Injury Risk Increased  Goal: Skin Health and Integrity  Outcome: Ongoing - Unchanged     Problem: Anemia (Chemotherapy Effects)  Goal: Anemia Symptom Improvement  Outcome: Ongoing - Unchanged     Problem: Urinary Bleeding Risk or Actual (Chemotherapy Effects)  Goal: Absence of Hematuria  Outcome: Ongoing - Unchanged     Problem: Nausea and Vomiting (Chemotherapy Effects)  Goal: Fluid and Electrolyte Balance  Outcome: Ongoing - Unchanged     Problem: Neurotoxicity (Chemotherapy Effects)  Goal: Neurotoxicity Symptom Control  Outcome: Ongoing - Unchanged     Problem: Neutropenia (Chemotherapy Effects)  Goal: Absence of Infection  Outcome: Ongoing - Unchanged     Problem: Oral Mucositis (Chemotherapy Effects)  Goal: Improved Oral Mucous Membrane Integrity  Outcome: Ongoing - Unchanged     Problem: Thrombocytopenia Bleeding Risk (Chemotherapy Effects)  Goal: Absence of Bleeding  Outcome: Ongoing - Unchanged

## 2018-11-11 NOTE — Unmapped (Signed)
Speech Language Pathology Clinical Swallow Assessment  Evaluation (11/11/18 1520)    Patient Name:  Kyle Huffman       Medical Record Number: [id number]   Date of Birth: 05-10-1951  Sex: Male            SLP Treatment Diagnosis: ?dysphagia  Activity Tolerance: Patient tolerated treatment well    Assessment  Pt seen for clinical swallow evaluation. Pt currently on regular solids + thin liquids. RN reports difficulty swallowing solids. Pt awake, participatory, & pleasant during evaluation. Speech fluent & conversationally appropriate. Oral mech exam WFL. Vocal quality clear/strong. SLP observed trials of thin liquids, puree, & hard solids with no concerns for aspiration observed. Mastication of solids functional, though pt reports difficulty swallowing hard solids. Pt expectorated bolus from oral cavity. SLP reviewed aspiration precautions/compensatory strategies (upright positioning, slow rate, small sips/bites, select soft solids, liquid wash after solids). Pt verbalized understanding. Oropharyngeal swallow function seemingly intact. Given esophageal complaints regarding swallowing, possible GI consult indicated. Recommend continue regular solids + thin liquids. RN aware. No further SLP services indicated at this time.     Risk for Aspiration: Mild     Recommendations:         Diet Solids Recommendation: Regular Solids (no restrictions)    Diet Liquids Recommendations: No liquid consistency restrictions    Recommended Form of Medications: Whole;With liquid(as tolerated)      Compensatory Swallowing Strategies: Upright as possible for all oral intake;Remain upright for 20-30 minutes after meals;Small bites/sips;Eat/feed slowly    Discharge Recommendations:  SLP services not indicated    Prognosis:  (+) participation    Plan of Care  SLP Follow-up / Frequency: D/C Services, D/C Services      Subjective  Patient/Caregiver Reports: RN reports pt has difficulty swallowing solids. Pt states I just can't swallow it Communication Preference: Verbal    Patient has no known allergies.  Current Facility-Administered Medications   Medication Dose Route Frequency Provider Last Rate Last Dose   ??? acetaminophen (TYLENOL) tablet 1,000 mg  1,000 mg Oral Q8H PRN Doren Custard, MD   1,000 mg at 11/10/18 2010   ??? aluminum-magnesium hydroxide-simethicone (MAALOX MAX) 80-80-8 mg/mL oral suspension  30 mL Oral Q4H PRN Steward Drone, MD       ??? amLODIPine (NORVASC) tablet 10 mg  10 mg Oral Daily Salome Arnt, MD   10 mg at 11/11/18 0841   ??? cefTRIAXone (ROCEPHIN) 2 g in sodium chloride 0.9 % (NS) 100 mL IVPB-connector bag  2 g Intravenous Q24H Lujean Amel, MD 200 mL/hr at 11/10/18 1711 2 g at 11/10/18 1711   ??? diphenhydrAMINE (BENADRYL) injection 25 mg  25 mg Intravenous Q4H PRN Halford Decamp, MD       ??? EPINEPHrine [facility]) injection 0.3 mg  0.3 mg Intramuscular Daily PRN Halford Decamp, MD       ??? famotidine (PF) (PEPCID) injection 20 mg  20 mg Intravenous Q4H PRN Halford Decamp, MD       ??? hydroxyzine (ATARAX) capsule/tablet 25 mg  25 mg Oral Q6H PRN Doren Custard, MD   25 mg at 11/10/18 2009   ??? influenza vaccine quad (FLUARIX, FLULAVAL, FLUZONE) (6 MOS & UP) 2019-20  0.5 mL Intramuscular During hospitalization Halford Decamp, MD       ??? insulin glargine (LANTUS) injection 15 Units  15 Units Subcutaneous Nightly Rosette Reveal, MD   15 Units at 11/10/18 2252   ???  insulin lispro (HumaLOG) injection 0-12 Units  0-12 Units Subcutaneous ACHS Salome Arnt, MD   4 Units at 11/10/18 1243   ??? IP OKAY TO TREAT   Other Continuous PRN Halford Decamp, MD       ??? melatonin tablet 3 mg  3 mg Oral Nightly PRN Steward Drone, MD   3 mg at 11/10/18 1808   ??? meperidine (DEMEROL) injection 25 mg  25 mg Intravenous Q30 Min PRN Halford Decamp, MD       ??? methylPREDNISolone sodium succinate (PF) (Solu-MEDROL) injection 125 mg  125 mg Intravenous Q4H PRN Halford Decamp, MD       ??? micafungin [facility]) 50 mg in sodium chloride (NS) 0.9 % 100 mL IVPB  50 mg Intravenous Q24H [facility] Lujean Amel, MD 115 mL/hr at 11/11/18 1034 50 mg at 11/11/18 1034   ??? mirtazapine (REMERON) tablet 7.5 mg  7.5 mg Oral Nightly Lujean Amel, MD       ??? OLANZapine zydis (ZyPREXA) disintegrating tablet 2.5 mg  2.5 mg Oral Nightly Lujean Amel, MD   2.5 mg at 11/10/18 2017   ??? ondansetron (ZOFRAN-ODT) disintegrating tablet 4 mg  4 mg Oral Q8H PRN Steward Drone, MD   4 mg at 11/02/18 0322    Or   ??? ondansetron (ZOFRAN) injection 4 mg  4 mg Intravenous Q8H PRN Steward Drone, MD   4 mg at 11/01/18 1359   ??? ondansetron (ZOFRAN) injection 8 mg  8 mg Intravenous Daily PRN Halford Decamp, MD       ??? oxyCODONE (ROXICODONE) immediate release tablet 5 mg  5 mg Oral Q4H PRN Doren Custard, MD   5 mg at 11/05/18 0035   ??? pravastatin (PRAVACHOL) tablet 10 mg  10 mg Oral Nightly Steward Drone, MD   10 mg at 11/10/18 2010   ??? senna (SENOKOT) tablet 2 tablet  2 tablet Oral Nightly PRN Steward Drone, MD       ??? sodium chloride (NS) 0.9 % flush 10 mL  10 mL Intravenous BID Salome Arnt, MD   10 mL at 11/11/18 0842   ??? sodium chloride (NS) 0.9 % flush 10 mL  10 mL Intravenous BID Salome Arnt, MD   10 mL at 11/11/18 0842   ??? sodium chloride (NS) 0.9 % flush 10 mL  10 mL Intravenous BID Salome Arnt, MD   10 mL at 11/11/18 0842   ??? sodium chloride (NS) 0.9 % flush 10 mL  10 mL Intravenous BID Halford Decamp, MD   10 mL at 11/11/18 [phone]   ??? sodium chloride (NS) 0.9 % infusion  20 mL/hr Intravenous Continuous PRN Halford Decamp, MD 20 mL/hr at 11/03/18 0019 20 mL/hr at 11/03/18 0019   ??? sodium chloride 0.9% (NS BOLUS) bolus 1,000 mL  1,000 mL Intravenous Daily PRN Halford Decamp, MD       ??? valACYclovir (VALTREX) tablet 500 mg  500 mg Oral Daily Rosette Reveal, MD   500 mg at 11/11/18 0841   ??? vancomycin (VANCOCIN) IVPB 1 g (premix)  1,000 mg Intravenous Q24H Cherie Dark, DO   1,000 mg at 11/11/18 0841   ??? venetoclax (VENCLEXTA) tablet 400 mg  400 mg Oral Daily Halford Decamp, MD   400 mg at 11/11/18 1478     Past Medical History:   Diagnosis Date   ???  Diabetes mellitus (CMS-[name])    ??? Hyperlipidemia    ??? Hypertension    ??? Stroke (CMS-[name])      Family History   Problem Relation Age of Onset   ??? Heart disease Father    ??? Hypertension Father    ??? Diabetes Father    ??? Brain cancer Mother    ??? Heart disease Sister      Past Surgical History:   Procedure Laterality Date   ??? CATARACT EXTRACTION     ??? TONSILLECTOMY       Social History     Tobacco Use   ??? Smoking status: Former Smoker     Packs/day: 0.50     Years: 25.00     Pack years: 12.50     Types: Cigarettes     Last attempt to quit: 2000     Years since quitting: 19.9   ??? Smokeless tobacco: Former Neurosurgeon     Types: Snuff   Substance Use Topics   ??? Alcohol use: Not Currently     General:  Current Functional Status: 67 y.o. male with a PMHx of htn, T2DM, CKD3, presented to [facility] with CN aortic valve endocarditis & monocytic AML, on aza/venetoclax C1D10. Pt currently on regular solids + thin liquids. SLP consulted for clinical swallow evaluation.    Pain Comments : Pt denies pain.    Precautions: Aspiration precautions     Medical Tests / Procedures Comments: CXR 12/1: Interval insertion of right IJ central venous catheter tip terminating at the superior cavoatrial junction. No pneumothorax.  Vision: Functional for self-feeding    Objective  Respiratory Status : Room air  History of Intubation: No          Behavior/Cognition: Alert;Cooperative  Positioning : Upright in bed    Oral / Motor Exam    Vocal Quality: Normal  Volitional Swallow: Within Functional Limits   Labial ROM: Within Functional Limits   Labial Symmetry: Within Functional Limits  Labial Strength: Within Functional Limits   Lingual ROM: Within Functional Limits  Lingual Symmetry: Within Functional Limits Lingual Strength: Within Functional Limits   Lingual Sensation: Within Functional Limits  Velum: Within Functional Limits   Mandible: Within Functional Limits  Coordination: WFL  Facial ROM: Within Functional Limits   Facial Symmetry: Within Functional Limits  Facial Strength: Within Functional Limits  Facial Sensation: Within Functional Limits   Vocal Intensity: Within Functional Limits   Apraxia: None present   Dysarthria: None present   Intelligibility: Intelligible   Breath Support: Adequate for speech   Dentition: Adequate    Consistencies assessed: Thin liquids, puree, hard solids    Session Duration : 15    I attest that I have reviewed the above information.  Signed: Sheral Apley Cloyde Oregel, SLP    Ceasar Mons 11/11/2018

## 2018-11-11 NOTE — Unmapped (Signed)
Plan of care in place,  no changes in status overnight;  AVSS,   using urinal with good output;  c/o muscle  aches, inability to sleep;  eventually able to sleep after Tylenol given.  Labs drawn @ 0000;  awaiting placement to SNF in [place]; remains free of falls/injury,  safety measures in place, reviewed with pt; bed alarm is on  Problem: Adult Inpatient Plan of Care  Goal: Plan of Care Review  Outcome: Progressing  Goal: Patient-Specific Goal (Individualization)  Outcome: Progressing  Goal: Absence of Hospital-Acquired Illness or Injury  Outcome: Progressing  Goal: Optimal Comfort and Wellbeing  Outcome: Progressing  Goal: Readiness for Transition of Care  Outcome: Progressing  Goal: Rounds/Family Conference  Outcome: Progressing     Problem: Fall Injury Risk  Goal: Absence of Fall and Fall-Related Injury  Outcome: Progressing     Problem: Self-Care Deficit  Goal: Improved Ability to Complete Activities of Daily Living  Outcome: Progressing     Problem: Diabetes Comorbidity  Goal: Blood Glucose Level Within Desired Range  Outcome: Progressing     Problem: Hypertension Comorbidity  Goal: Blood Pressure in Desired Range  Outcome: Progressing     Problem: Skin Injury Risk Increased  Goal: Skin Health and Integrity  Outcome: Progressing     Problem: Anemia (Chemotherapy Effects)  Goal: Anemia Symptom Improvement  Outcome: Progressing     Problem: Urinary Bleeding Risk or Actual (Chemotherapy Effects)  Goal: Absence of Hematuria  Outcome: Progressing     Problem: Nausea and Vomiting (Chemotherapy Effects)  Goal: Fluid and Electrolyte Balance  Outcome: Progressing     Problem: Neurotoxicity (Chemotherapy Effects)  Goal: Neurotoxicity Symptom Control  Outcome: Progressing     Problem: Neutropenia (Chemotherapy Effects)  Goal: Absence of Infection  Outcome: Progressing     Problem: Oral Mucositis (Chemotherapy Effects)  Goal: Improved Oral Mucous Membrane Integrity  Outcome: Progressing     Problem: Thrombocytopenia Bleeding Risk (Chemotherapy Effects)  Goal: Absence of Bleeding  Outcome: Progressing

## 2018-11-11 NOTE — Unmapped (Signed)
Malignant Hematology (MDE1) Progress Note    Interval History:  No acute events overnight. Kyle Huffman is feeling very frustrated this morning that he is still here in the hospital and is tired of both being here and eating the food. He reports he is not eating much because he is wanting to leave.    Awaiting insurance authorization for SNF. Paged CM to see if any updates.    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 Kyle Huffman is a 67 y.o. male with a PMHx of htn, T2DM, CKD3, presented to [facility] with CN aortic valve endocarditis &??monocytic AML, on aza/venetoclax C1D10.    Culture-Negative Aortic Valve Endocarditis:??Patient met sepsis criteria on admission to OSH and source was felt to be multiple dental abscesses seen on CT MaxFac. Multiple sets of blood cx are NG at 5 days, however he was taking oral amoxicillin on admission. HACEK serologies negative. TEE was performed on 11/14 that showed medium-sized (0.6cm x0.9cm) vegetation on the left ventricular aspect of the left coronary cusp; TTE here 11/25 confirmed. S/p broad spectrum abx at OSH from 11/8- 11/21. S/p teeth extraction 11/27.??  -f/u repeat BCx 11/26??and 12/1; NGTD  -IV vanc??dosed per pharmacy (11/22-- ), Ceftriaxone 2g q24h??(11/22-12/2??and 12/4--??), cefepime??(12/2-12/4)  -Plan for four weeks of antibiotics, complete on 12/24. Next vanc trough for: 12/9. Weekly CBC w/diff, CMP, and vanc trough. Labs to be faxed to patient's primary oncology nurse navigator.  -ID appointment on 12/24.  ??  Monocytic AML:??BMBx results 11/22 c/w monocytic AML with 65% blasts, cytogenetics reveal a tri8 and a ???jumping?????3q chromosome. FLT3 negative.??PET/CT 11/26 with diffuse uptake but limited to bone marrow. Using the MRC prognostic model, he should be considered high risk. Started chemo 11/27. TLS/DIC labs improved. As of 12/5 he became neutropenic. Prophylaxis started.  -Aza/venetoclax C1D1 11/27??- 12/7 is C1D11  -Transfusion Thresholds: Hgb <8, PLT <10, fibrinogen <150  -PPx: valtrex, micafungin  ??  Sinus Tachycardia:??Stable. He has been tachy throughout most of the hospitalization. It is NSR, he has shown some improvement with fluids and treatment of his disease. He is stable.  ??  Bilateral LE Weakness:??Improved. MRI T/L Spine done at [facility] showed neural foraminal narrowing at L3-4 and L5-S1 (moderate on the right side). Also showed diffusely abnormal bone marrow signal in the spine concerning for infiltrative tumor. No canal narrowing. Strength??at times??symmetric vs R>L (seems limited by pain which is intermittent),??with mild diffuse weakness, though patient able to ambulate.??Question is whether this could be due to CNS component of his leukemia vs deconditioning/pain??in the setting of illness.   -PT/OT, patient to go to SNF  ??  Delirium: Resolved. Patient was getting agitated and combative when care attempted??at night. Patient notably has underlying brain injury, previous strokes. Resolved with initiation of chemo.??  -Delirium precautions   -Zyprexa??2.5 mg qhs  ??  T2DM:??Home regimen is Lantus 42u nightly, empagliflozin 10mg  daily, and metformin 1000mg  BID. Last HbA1c 6.7% in August 2019.??Started on reduced dose of  lantus given poor PO.??He has required mealtime dosing insulin as well. But now is not having much PO and sugars are low. Will d/c mealtime and continue lower dose lantus.  -Lantus 15U qHS  -d/c Lispro mealtime  -continue sliding scale with glucose checks   -Hold home metformin, empagliflozin in patient setting   -will start remeron for appetite stimulant in setting of very limited and poor PO intake  ??  CKD-III:??Stable.??Creatinine varies from 0.9-1.4. ~1.6 on admission to [facility], improved to w/i baseline subsequently.??Continuing to hold home??ACEI and diuretic??given normotension with the norvasc. Creatinine was slowly increasing, but today seeing mild improvement.  -Holding??home??ACEI and HCTZ  ??  Essential Hypertension:??Home regimen is HCTZ 12.5mg  daily, lisinopril 20mg  daily.??Was getting metoprolol tartrate as substitute for HCTZ??given concern for AKI??at OSH,??as well as??diltiazem, unclear reason. Amlodipine favored and was started here. Pressures remain controlled.   -continue amlodipine 10 mg daily  -holding lisinopril and hctz as above  -outpatient follow up with PCP to follow blood pressure    Daily Checklist:  Diet: reg  DVT PPx: scds  GI PPx: none   Electrolytes: Replete PRN  Code Status: DNR and DNI    Dispo: SNF today    Heme/Onc Appointments as below:  12/9: Dr. Orlie Dakin appointment (transfusion support can be done) - will address if insurance auth not in place by this time for SNF.  12/12: [place]  12/23Thera Flake, BMB  12/26: Dr. Vertell Limber   ___________________________________________________________________    Labs/Studies:  Labs and studies from the last 24 hours reviewed.    Objective:  Temp:  [36.2 ??C-37.2 ??C] 37.2 ??C  Heart Rate:  [102-112] 107  Resp:  [18] 18  BP: (109-139)/(61-75) 109/62  SpO2:  [97 %-99 %] 98 %    GEN: NAD, alert, oriented, answers questions appropriately  CV: RRR, S1, S2, no M/R/G  RESP: CTAB, no wheezes or crackles  ABD: Normoactive bowel sounds, soft, NTND, no rebound/guarding  EXT: No clubbing, cyanosis, or edema  SKIN:  No rashes or lesions noted    Rosette Reveal, MD

## 2018-11-12 ENCOUNTER — Inpatient Hospital Stay: Payer: Medicare HMO

## 2018-11-12 ENCOUNTER — Other Ambulatory Visit: Payer: Self-pay

## 2018-11-12 ENCOUNTER — Inpatient Hospital Stay: Payer: Medicare HMO | Admitting: Oncology

## 2018-11-12 DIAGNOSIS — I358 Other nonrheumatic aortic valve disorders: Secondary | ICD-10-CM | POA: Diagnosis not present

## 2018-11-12 DIAGNOSIS — D696 Thrombocytopenia, unspecified: Secondary | ICD-10-CM

## 2018-11-12 DIAGNOSIS — I10 Essential (primary) hypertension: Secondary | ICD-10-CM | POA: Diagnosis not present

## 2018-11-12 DIAGNOSIS — N183 Chronic kidney disease, stage 3 (moderate): Secondary | ICD-10-CM | POA: Diagnosis not present

## 2018-11-12 DIAGNOSIS — R531 Weakness: Secondary | ICD-10-CM | POA: Diagnosis not present

## 2018-11-12 DIAGNOSIS — E119 Type 2 diabetes mellitus without complications: Secondary | ICD-10-CM | POA: Diagnosis not present

## 2018-11-12 DIAGNOSIS — C9300 Acute monoblastic/monocytic leukemia, not having achieved remission: Secondary | ICD-10-CM | POA: Diagnosis not present

## 2018-11-12 LAB — COMPREHENSIVE METABOLIC PANEL
ALBUMIN: 2.8 g/dL — ABNORMAL LOW (ref 3.5–5.0)
ALKALINE PHOSPHATASE: 526 U/L — ABNORMAL HIGH (ref 38–126)
ALT (SGPT): 14 U/L (ref <50)
ANION GAP: 8 mmol/L (ref 7–15)
AST (SGOT): 17 U/L — ABNORMAL LOW (ref 19–55)
BILIRUBIN TOTAL: 0.7 mg/dL (ref 0.0–1.2)
BLOOD UREA NITROGEN: 16 mg/dL (ref 7–21)
BUN / CREAT RATIO: 12
CALCIUM: 7.9 mg/dL — ABNORMAL LOW (ref 8.5–10.2)
CO2: 25 mmol/L (ref 22.0–30.0)
CREATININE: 1.36 mg/dL — ABNORMAL HIGH (ref 0.70–1.30)
EGFR CKD-EPI NON-AA MALE: 53 mL/min/{1.73_m2} — ABNORMAL LOW (ref >=60)
GLUCOSE RANDOM: 108 mg/dL (ref 65–179)
POTASSIUM: 4.4 mmol/L (ref 3.5–5.0)
PROTEIN TOTAL: 5.4 g/dL — ABNORMAL LOW (ref 6.5–8.3)
SODIUM: 136 mmol/L (ref 135–145)

## 2018-11-12 LAB — ALBUMIN: Albumin:MCnc:Pt:Ser/Plas:Qn:: 2.8 — ABNORMAL LOW

## 2018-11-12 LAB — CBC W/ AUTO DIFF
BASOPHILS ABSOLUTE COUNT: 0 10*9/L (ref 0.0–0.1)
BASOPHILS RELATIVE PERCENT: 0.5 %
EOSINOPHILS ABSOLUTE COUNT: 0 10*9/L (ref 0.0–0.4)
EOSINOPHILS RELATIVE PERCENT: 0.2 %
HEMATOCRIT: 26 % — ABNORMAL LOW (ref 41.0–53.0)
HEMOGLOBIN: 8.8 g/dL — ABNORMAL LOW (ref 13.5–17.5)
LARGE UNSTAINED CELLS: 6 % — ABNORMAL HIGH (ref 0–4)
LYMPHOCYTES ABSOLUTE COUNT: 0.4 10*9/L — ABNORMAL LOW (ref 1.5–5.0)
LYMPHOCYTES RELATIVE PERCENT: 79.7 %
MEAN CORPUSCULAR HEMOGLOBIN CONC: 34.1 g/dL (ref 31.0–37.0)
MEAN CORPUSCULAR HEMOGLOBIN: 29 pg (ref 26.0–34.0)
MEAN CORPUSCULAR VOLUME: 85.2 fL (ref 80.0–100.0)
MEAN PLATELET VOLUME: 9.3 fL (ref 7.0–10.0)
MONOCYTES ABSOLUTE COUNT: 0 10*9/L — ABNORMAL LOW (ref 0.2–0.8)
MONOCYTES RELATIVE PERCENT: 2.7 %
PLATELET COUNT: 37 10*9/L — ABNORMAL LOW (ref 150–440)
RED BLOOD CELL COUNT: 3.05 10*12/L — ABNORMAL LOW (ref 4.50–5.90)
WBC ADJUSTED: 0.5 10*9/L — ABNORMAL LOW (ref 4.5–11.0)

## 2018-11-12 LAB — FIBRINOGEN LEVEL: Lab: 626 — ABNORMAL HIGH

## 2018-11-12 LAB — PHOSPHORUS: Phosphate:MCnc:Pt:Ser/Plas:Qn:: 3.9

## 2018-11-12 LAB — D-DIMER QUANTITATIVE (CH,ML,PD,ET): Lab: 6604 — ABNORMAL HIGH

## 2018-11-12 LAB — LYMPHOCYTES ABSOLUTE COUNT: Lab: 0.4 — ABNORMAL LOW

## 2018-11-12 LAB — PROTIME: Lab: 14.7 — ABNORMAL HIGH

## 2018-11-12 LAB — APTT
APTT: 29.3 s (ref 25.9–39.5)
Coagulation surface induced:Time:Pt:PPP:Qn:Coag: 29.3

## 2018-11-12 LAB — LACTATE DEHYDROGENASE: Lactate dehydrogenase:CCnc:Pt:Ser/Plas:Qn:: 646 — ABNORMAL HIGH

## 2018-11-12 LAB — URIC ACID: Urate:MCnc:Pt:Ser/Plas:Qn:: 2.6 — ABNORMAL LOW

## 2018-11-12 LAB — VANCOMYCIN TROUGH: Vancomycin^trough:MCnc:Pt:Ser/Plas:Qn:: 17.2

## 2018-11-12 MED ORDER — VANCOMYCIN HCL IN DEXTROSE 1-5 GM/200ML-% IV SOLN
1000.00 | INTRAVENOUS | Status: DC
Start: 2018-11-14 — End: 2018-11-12

## 2018-11-12 MED ORDER — GENERIC EXTERNAL MEDICATION
50.00 | Status: DC
Start: 2018-11-14 — End: 2018-11-12

## 2018-11-12 MED ORDER — INSULIN GLARGINE 100 UNIT/ML ~~LOC~~ SOLN
15.00 | SUBCUTANEOUS | Status: DC
Start: 2018-11-13 — End: 2018-11-12

## 2018-11-12 MED ORDER — MIRTAZAPINE 15 MG PO TABS
7.50 | ORAL_TABLET | ORAL | Status: DC
Start: 2018-11-13 — End: 2018-11-12

## 2018-11-12 NOTE — Unmapped (Signed)
Vancomycin Therapeutic Monitoring Pharmacy Note    Saveon Plant is a 67 y.o. male starting vancomycin. Date of therapy restart: 11/12 from OSH (per notes, also received from 11/8-11/10)    Indication: Endocarditis    Prior Dosing Information: Current regimen 1000 mg q24h      Goals:  Therapeutic Drug Levels  Vancomycin trough goal: 15-20 mg/L    Additional Clinical Monitoring/Outcomes  Renal function, volume status (intake and output)    Results: Vancomycin level 17.2 mg/L (drawn appropriately).       Wt Readings from Last 1 Encounters:   11/12/18 59.6 kg (131 lb 6.3 oz)     Creatinine   Date Value Ref Range Status   11/12/2018 1.36 (H) 0.70 - 1.30 mg/dL Final   56/21/3086 5.78 (H) 0.70 - 1.30 mg/dL Final   46/96/2952 8.41 (H) 0.70 - 1.30 mg/dL Final        Pharmacokinetic Considerations and Significant Drug Interactions:  ? Adult (calculated on 12/5): Vd = 46.86 L, ke = 0.0367 hr-1  ? Concurrent nephrotoxic meds: not applicable    Assessment/Plan:  Recommendation(s)  ? Continue current regimen of vancomycin 1000 mg q24h  ? Estimated trough on recommended regimen: 17 mg/L    Follow-up  ? Level due: in 5-7 days.   ? A pharmacist will continue to monitor and order levels as appropriate    Please page service pharmacist with questions/clarifications.    Margarite Gouge, PharmD, BCOP  Pager [phone]

## 2018-11-12 NOTE — Unmapped (Signed)
Pt.free of falls or injury. Pt.refused vital signs overnight.  Denied complaints overnight, rested quietly.

## 2018-11-12 NOTE — Unmapped (Signed)
Malignant Hematology (MDE1) Progress Note    Interval History:  No acute events overnight. He feels well this morning. Trying to eat, but just really wants to leave and not feeling like he likes our food. He is trying to work on ensures with his brother's encouragement.    Awaiting SNF insurance authorization.    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 Kyle Huffman is a 67 y.o. male with a PMHx of htn, T2DM, CKD3, presented to [facility] with CN aortic valve endocarditis &??monocytic AML, on aza/venetoclax C1D10.    Culture-Negative Aortic Valve Endocarditis:??Patient met sepsis criteria on admission to OSH and source was felt to be multiple dental abscesses seen on CT MaxFac. Multiple sets of blood cx are NG at 5 days, however he was taking oral amoxicillin on admission. HACEK serologies negative. TEE was performed on 11/14 that showed medium-sized (0.6cm x0.9cm) vegetation on the left ventricular aspect of the left coronary cusp; TTE here 11/25 confirmed. S/p broad spectrum abx at OSH from 11/8- 11/21. S/p teeth extraction 11/27.??  -f/u repeat BCx 11/26??and 12/1; NGTD  -IV vanc??dosed per pharmacy (11/22-- ), Ceftriaxone 2g q24h??(11/22-12/2??and 12/4--??), cefepime??(12/2-12/4)  -Plan for four weeks of antibiotics, complete on 12/24. Next vanc trough for: 12/9. Weekly CBC w/diff, CMP, and vanc trough. Labs to be faxed to patient's primary oncology nurse navigator.  -ID appointment on 12/24.  ??  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 FLT3 negative.??PET/CT 11/26 with diffuse uptake but limited to bone marrow. Using the MRC prognostic model, he should be considered high risk. Started chemo 11/27. TLS/DIC labs improved. As of 12/5 he became neutropenic. Prophylaxis started.  -Aza/venetoclax C1D1 11/27??- 12/7 is C1D11  -Transfusion Thresholds: Hgb <8, PLT <10, fibrinogen <150  -PPx: valtrex, micafungin - will find out if SNF okay with this medication  ??  Sinus Tachycardia:??Stable. He has been tachy throughout most of the hospitalization. It is NSR, he has shown some improvement with fluids and treatment of his disease. He is stable.  ??  Bilateral LE Weakness:??Improved. MRI T/L Spine done at [facility] showed neural foraminal narrowing at L3-4 and L5-S1 (moderate on the right side). Also showed diffusely abnormal bone marrow signal in the spine concerning for infiltrative tumor. No canal narrowing. Strength??at times??symmetric vs R>L (seems limited by pain which is intermittent),??with mild diffuse weakness, though patient able to ambulate.??Question is whether this could be due to CNS component of his leukemia vs deconditioning/pain??in the setting of illness.   -PT/OT, patient to go to SNF  ??  Delirium: Resolved. Patient was getting agitated and combative when care attempted??at night. Patient notably has underlying brain injury, previous strokes. Resolved with initiation of chemo.??  -Delirium precautions   -Zyprexa??2.5 mg qhs  ??  T2DM:??Home regimen is Lantus 42u nightly, empagliflozin 10mg  daily, and metformin 1000mg  BID. Last HbA1c 6.7% in August 2019.??Started on reduced dose of lantus given poor PO.??He has  required mealtime dosing insulin as well. But now is not having much PO and sugars are low. Doing well on lower dose lantus.  -Lantus 15U qHS  -continue sliding scale with glucose checks   -Hold home metformin, empagliflozin in patient setting   -will start remeron for appetite stimulant in setting of very limited and poor PO intake  ??  CKD-III:??Stable.??Creatinine varies from 0.9-1.4. ~1.6 on admission to [facility], improved to w/i baseline subsequently.??Continuing to hold home??ACEI and diuretic??given normotension with the norvasc. Creatinine was slowly increasing, but continuing seeing mild improvement.  -Holding??home??ACEI and HCTZ  ??  Essential Hypertension:??Home regimen is HCTZ 12.5mg  daily, lisinopril 20mg  daily.??Was getting metoprolol tartrate as substitute for HCTZ??given concern for AKI??at OSH,??as well as??diltiazem, unclear reason. Amlodipine favored and was started here. Pressures remain controlled.   -continue amlodipine 10 mg daily  -holding lisinopril and hctz as above  -outpatient follow up with PCP to follow blood pressure    Daily Checklist:  Diet: reg  DVT PPx: scds  GI PPx: none   Electrolytes: Replete PRN  Code Status: DNR and DNI    Dispo: SNF today    Heme/Onc Appointments as below:  12/12: Dr. Orlie Huffman  12/18: Kyle Huffman  12/23Thera Huffman, BMB  12/26: Dr. Vertell Huffman   ___________________________________________________________________    Labs/Studies:  Labs and studies from the last 24 hours reviewed.    Objective:  Temp:  [36.8 ??C-37.1 ??C] 37.1 ??C  Heart Rate:  [106-115] 115  Resp:  [18] 18  BP: (126-136)/(64-66) 129/64  SpO2:  [97 %-100 %] 98 %    GEN: NAD, alert, oriented, answers questions appropriately  CV: RRR, S1, S2, no M/R/G  RESP: CTAB, no wheezes or crackles  ABD: Normoactive bowel sounds, soft, NTND, no rebound/guarding  EXT: No clubbing, cyanosis, or edema  SKIN:  No rashes or lesions noted    Kyle Reveal, MD

## 2018-11-12 NOTE — Unmapped (Signed)
Pt had difficulty swallowing his breakfast this AM. Pt doing well with liquids and pills. Speech did swallow eval and will discuss findings with primary team. Pt remained alert and mostly oriented throughout the shift - only forgetful at times. Pt expressed frustration with waiting on SNF insurance approval. Falls precautions maintained - bed alarm on. Chemo precautions maintained. Pt received venetoclax this AM. VSS. Will continue to monitor.     Problem: Adult Inpatient Plan of Care  Goal: Plan of Care Review  Outcome: Ongoing - Unchanged     Problem: Fall Injury Risk  Goal: Absence of Fall and Fall-Related Injury  Outcome: Ongoing - Unchanged     Problem: Self-Care Deficit  Goal: Improved Ability to Complete Activities of Daily Living  Outcome: Ongoing - Unchanged     Problem: Neurotoxicity (Chemotherapy Effects)  Goal: Neurotoxicity Symptom Control  Outcome: Ongoing - Unchanged

## 2018-11-13 DIAGNOSIS — Z79899 Other long term (current) drug therapy: Secondary | ICD-10-CM | POA: Diagnosis not present

## 2018-11-13 DIAGNOSIS — R410 Disorientation, unspecified: Secondary | ICD-10-CM | POA: Diagnosis not present

## 2018-11-13 DIAGNOSIS — R5381 Other malaise: Secondary | ICD-10-CM | POA: Diagnosis not present

## 2018-11-13 DIAGNOSIS — M6281 Muscle weakness (generalized): Secondary | ICD-10-CM | POA: Diagnosis not present

## 2018-11-13 DIAGNOSIS — Z8673 Personal history of transient ischemic attack (TIA), and cerebral infarction without residual deficits: Secondary | ICD-10-CM | POA: Diagnosis not present

## 2018-11-13 DIAGNOSIS — C7951 Secondary malignant neoplasm of bone: Secondary | ICD-10-CM | POA: Diagnosis not present

## 2018-11-13 DIAGNOSIS — I10 Essential (primary) hypertension: Secondary | ICD-10-CM | POA: Diagnosis not present

## 2018-11-13 DIAGNOSIS — D709 Neutropenia, unspecified: Secondary | ICD-10-CM | POA: Diagnosis not present

## 2018-11-13 DIAGNOSIS — I358 Other nonrheumatic aortic valve disorders: Secondary | ICD-10-CM | POA: Diagnosis not present

## 2018-11-13 DIAGNOSIS — N183 Chronic kidney disease, stage 3 (moderate): Secondary | ICD-10-CM | POA: Diagnosis not present

## 2018-11-13 DIAGNOSIS — C9300 Acute monoblastic/monocytic leukemia, not having achieved remission: Secondary | ICD-10-CM | POA: Diagnosis not present

## 2018-11-13 DIAGNOSIS — Z794 Long term (current) use of insulin: Secondary | ICD-10-CM | POA: Diagnosis not present

## 2018-11-13 DIAGNOSIS — D61818 Other pancytopenia: Secondary | ICD-10-CM | POA: Diagnosis not present

## 2018-11-13 DIAGNOSIS — I129 Hypertensive chronic kidney disease with stage 1 through stage 4 chronic kidney disease, or unspecified chronic kidney disease: Secondary | ICD-10-CM | POA: Diagnosis not present

## 2018-11-13 DIAGNOSIS — E1122 Type 2 diabetes mellitus with diabetic chronic kidney disease: Secondary | ICD-10-CM | POA: Diagnosis not present

## 2018-11-13 DIAGNOSIS — Z5111 Encounter for antineoplastic chemotherapy: Secondary | ICD-10-CM | POA: Diagnosis not present

## 2018-11-13 DIAGNOSIS — R531 Weakness: Secondary | ICD-10-CM | POA: Diagnosis not present

## 2018-11-13 DIAGNOSIS — I330 Acute and subacute infective endocarditis: Secondary | ICD-10-CM | POA: Diagnosis not present

## 2018-11-13 DIAGNOSIS — E785 Hyperlipidemia, unspecified: Secondary | ICD-10-CM | POA: Diagnosis not present

## 2018-11-13 DIAGNOSIS — E119 Type 2 diabetes mellitus without complications: Secondary | ICD-10-CM | POA: Diagnosis not present

## 2018-11-13 DIAGNOSIS — D649 Anemia, unspecified: Secondary | ICD-10-CM | POA: Diagnosis not present

## 2018-11-13 DIAGNOSIS — R000 Tachycardia, unspecified: Secondary | ICD-10-CM | POA: Diagnosis not present

## 2018-11-13 DIAGNOSIS — N179 Acute kidney failure, unspecified: Secondary | ICD-10-CM | POA: Diagnosis not present

## 2018-11-13 DIAGNOSIS — C9200 Acute myeloblastic leukemia, not having achieved remission: Secondary | ICD-10-CM | POA: Diagnosis not present

## 2018-11-13 DIAGNOSIS — Z452 Encounter for adjustment and management of vascular access device: Secondary | ICD-10-CM | POA: Diagnosis not present

## 2018-11-13 DIAGNOSIS — C864 Blastic NK-cell lymphoma: Secondary | ICD-10-CM | POA: Diagnosis not present

## 2018-11-13 DIAGNOSIS — E1140 Type 2 diabetes mellitus with diabetic neuropathy, unspecified: Secondary | ICD-10-CM | POA: Diagnosis not present

## 2018-11-13 DIAGNOSIS — E1142 Type 2 diabetes mellitus with diabetic polyneuropathy: Secondary | ICD-10-CM | POA: Diagnosis not present

## 2018-11-13 DIAGNOSIS — R279 Unspecified lack of coordination: Secondary | ICD-10-CM | POA: Diagnosis not present

## 2018-11-13 DIAGNOSIS — Z87891 Personal history of nicotine dependence: Secondary | ICD-10-CM | POA: Diagnosis not present

## 2018-11-13 LAB — CBC W/ AUTO DIFF
BASOPHILS ABSOLUTE COUNT: 0 10*9/L (ref 0.0–0.1)
BASOPHILS RELATIVE PERCENT: 0 %
EOSINOPHILS ABSOLUTE COUNT: 0 10*9/L (ref 0.0–0.4)
EOSINOPHILS RELATIVE PERCENT: 0.5 %
HEMATOCRIT: 24.4 % — ABNORMAL LOW (ref 41.0–53.0)
HEMOGLOBIN: 8.7 g/dL — ABNORMAL LOW (ref 13.5–17.5)
LARGE UNSTAINED CELLS: 3 % (ref 0–4)
LYMPHOCYTES ABSOLUTE COUNT: 0.3 10*9/L — ABNORMAL LOW (ref 1.5–5.0)
LYMPHOCYTES RELATIVE PERCENT: 74.5 %
MEAN CORPUSCULAR HEMOGLOBIN CONC: 35.6 g/dL (ref 31.0–37.0)
MEAN CORPUSCULAR HEMOGLOBIN: 30 pg (ref 26.0–34.0)
MEAN CORPUSCULAR VOLUME: 84.3 fL (ref 80.0–100.0)
MONOCYTES ABSOLUTE COUNT: 0 10*9/L — ABNORMAL LOW (ref 0.2–0.8)
MONOCYTES RELATIVE PERCENT: 4.1 %
NEUTROPHILS ABSOLUTE COUNT: 0.1 10*9/L — CL (ref 2.0–7.5)
NEUTROPHILS RELATIVE PERCENT: 17.6 %
PLATELET COUNT: 31 10*9/L — ABNORMAL LOW (ref 150–440)
RED BLOOD CELL COUNT: 2.9 10*12/L — ABNORMAL LOW (ref 4.50–5.90)
RED CELL DISTRIBUTION WIDTH: 15.7 % — ABNORMAL HIGH (ref 12.0–15.0)
WBC ADJUSTED: 0.5 10*9/L — ABNORMAL LOW (ref 4.5–11.0)

## 2018-11-13 LAB — COMPREHENSIVE METABOLIC PANEL
ALBUMIN: 2.7 g/dL — ABNORMAL LOW (ref 3.5–5.0)
ALKALINE PHOSPHATASE: 499 U/L — ABNORMAL HIGH (ref 38–126)
ALT (SGPT): 14 U/L (ref <50)
ANION GAP: 7 mmol/L (ref 7–15)
AST (SGOT): 17 U/L — ABNORMAL LOW (ref 19–55)
BLOOD UREA NITROGEN: 17 mg/dL (ref 7–21)
CALCIUM: 7.8 mg/dL — ABNORMAL LOW (ref 8.5–10.2)
CHLORIDE: 101 mmol/L (ref 98–107)
CO2: 27 mmol/L (ref 22.0–30.0)
CREATININE: 1.43 mg/dL — ABNORMAL HIGH (ref 0.70–1.30)
EGFR CKD-EPI AA MALE: 58 mL/min/{1.73_m2} — ABNORMAL LOW (ref >=60)
EGFR CKD-EPI NON-AA MALE: 50 mL/min/{1.73_m2} — ABNORMAL LOW (ref >=60)
GLUCOSE RANDOM: 141 mg/dL (ref 65–179)
POTASSIUM: 4.1 mmol/L (ref 3.5–5.0)
PROTEIN TOTAL: 5.4 g/dL — ABNORMAL LOW (ref 6.5–8.3)

## 2018-11-13 LAB — CO2: Carbon dioxide:SCnc:Pt:Ser/Plas:Qn:: 27

## 2018-11-13 LAB — EOSINOPHILS ABSOLUTE COUNT: Lab: 0

## 2018-11-13 MED ORDER — MIRTAZAPINE 7.5 MG TABLET
ORAL_TABLET | Freq: Every evening | ORAL | 3 refills | 0.00000 days
Start: 2018-11-13 — End: 2019-01-10

## 2018-11-13 MED ORDER — PRAVASTATIN 10 MG TABLET
ORAL_TABLET | Freq: Every evening | ORAL | 0 refills | 0.00000 days
Start: 2018-11-13 — End: 2019-01-15

## 2018-11-13 MED ORDER — OLANZAPINE 5 MG DISINTEGRATING TABLET
ORAL_TABLET | Freq: Every evening | ORAL | 0 refills | 0 days
Start: 2018-11-13 — End: 2019-01-15

## 2018-11-13 MED ORDER — MICAFUNGIN IVPB (50 MG VIAL) IN 100 ML
INTRAVENOUS | 0 refills | 0 days | Status: CP
Start: 2018-11-13 — End: 2019-01-15

## 2018-11-13 MED ORDER — ONDANSETRON 4 MG DISINTEGRATING TABLET
ORAL_TABLET | Freq: Three times a day (TID) | ORAL | 0 refills | 0 days | PRN
Start: 2018-11-13 — End: 2019-01-15

## 2018-11-13 MED ORDER — VALACYCLOVIR 500 MG TABLET
Freq: Every day | ORAL | 0 refills | 0.00000 days
Start: 2018-11-13 — End: 2019-01-15

## 2018-11-13 MED ORDER — INSULIN GLARGINE (U-100) 100 UNIT/ML SUBCUTANEOUS SOLUTION
Freq: Every evening | SUBCUTANEOUS | 0 refills | 0 days
Start: 2018-11-13 — End: 2019-01-15

## 2018-11-13 MED ORDER — INSULIN LISPRO (U-100) 100 UNIT/ML SUBCUTANEOUS SOLUTION
Freq: Four times a day (QID) | SUBCUTANEOUS | 0 refills | 0.00000 days
Start: 2018-11-13 — End: 2019-01-15

## 2018-11-13 MED ORDER — AMLODIPINE 10 MG TABLET
ORAL_TABLET | Freq: Every day | ORAL | 3 refills | 0.00000 days
Start: 2018-11-13 — End: 2019-01-15

## 2018-11-13 MED ORDER — CEFTRIAXONE IVPB 2 GRAM CONNECTOR BAG
INTRAVENOUS | 0 refills | 0 days
Start: 2018-11-13 — End: 2018-11-27

## 2018-11-13 MED ORDER — VANCOMYCIN 1 GRAM/200 ML IN DEXTROSE 5 % INTRAVENOUS PIGGYBACK
INTRAVENOUS | 0 refills | 0.00000 days
Start: 2018-11-13 — End: 2018-11-27

## 2018-11-13 NOTE — Unmapped (Signed)
Physician Discharge Summary [facility]  [facility]  [address]  [place] [address]  Dept: [phone]  Loc: [phone]     Identifying Information:   Kyle Huffman  01-Aug-1951  [id number]    Primary Care Physician: Referring Unknown, Per Noah Charon     Referring Physician: Timoteo Ace Dev*     Code Status: DNR and DNI    Admit Date: 10/26/2018    Discharge Date: 11/13/2018     Discharge To: Skilled nursing facility    Discharge Service: MDE - Hematology Teaching     Discharge Attending Physician: Halford Decamp, MD    Discharge Diagnoses:  Principal Problem:    Blastic plasmacytoid dendritic cell neoplasm (BPDCN) (CMS-[name])  Active Problems:    Chronic kidney disease (CKD), stage III (moderate) (CMS-[name])    Hyperlipidemia associated with type 2 diabetes mellitus (CMS-[name])    Type 2 diabetes mellitus with diabetic polyneuropathy, with long-term current use of insulin (CMS-[name])    Aortic valve endocarditis    Hypertension    Weakness of both lower extremities    Acute myeloid leukemia not having achieved remission (CMS-[name])  Resolved Problems:    * No resolved hospital problems. *    SNF Follow-up Issues:  [ ]  Diabetes: The goal is to titrate his lantus up to his home dose of 42 units nightly. We are sending him on lantus 15 units nightly with sliding scale (see below for sliding scale orders). As he begins feeling better, he will begin taking in more PO and will need his lantus titrated up. Hopefully with this uptitration, he will not need d/c home on lispro and sliding scale.  Sliding scale orders:  Blood Glucose   51-70 mg/dL  Give juice/crackers  34-742 mg/dL  0 units  595-638 mg/dL 2 units  756-433 mg/dL 4 units  295-188 mg/dL 6 units  416-606 mg/dL 8 units  301-601 mg/dL 10 units  > 093 mg/dL  12 units  [ ]  For his antibiotics, he will be on IV ceftriaxone 2g q24h and IV vancomycin 1250mg  q24h until 12/24. For his labs, he will need a weekly CBC w/diff, CMP, and vanc trough. Please fax the results to Dr. Bronson Curb nurse navigator Doristine Church Clayton Lefort) [name] at [phone]. Phone is [phone].  -Check next labs and vanc trough-- Monday 12/16    Appointments as follows:  12/12 with Dr. Orlie Dakin in [place], [place]  12/18 11:15am labs at [facility]  12/18 12:00pm with Buhlinger at [facility]  12/18 1:00pm with Silvestre Moment NP at [facility]  12/23 1:00pm labs at [facility]  12/23 2:00pm bone marrow biopsy at [facility]  12/24 9:30am with Dr. Reynold Bowen at [facility] for Infectious Disease  12/26 8:15am labs at [facility]  12/26 9:00am with Dr. Vertell Limber at [facility] for Oncology    Outpatient Provider Follow Up Issues:   [ ]  Please follow up creatinine and if showing improvement please resume home metformin.  [ ]  when abx are done, check and assess for need for levaquin ppx.  [ ]  discharged on IV micafungin to SNF. Assess how long this needs to be given for based on count recovery. If he were to need switched to posaconazole or fluconazole, he would need his venetaclax dosing changed by pharmacy.    Supportive Care Recommendations:  We recommend based on the patient???s underlying diagnosis and treatment history the following supportive care:    1. Antimicrobial  prophylaxis:  AML (not in remission): Bacterial: Not on any currently as on IV cefepime and lV vanc. When off this, would consider Levofloxacin 500mg  PO daily (if absolute neutrophils >/= 0.5 until absolute neutrophils >/= 0.5);   Fungal: AML fungal: Other: IV micafungin ;   Viral: Valacyclovir 500mg  PO daily (continuous)    2. Blood product support:  Leukoreduced blood products are required.  Irradiated blood products are preferred, but in case of urgent transfusion needs non-irradiated blood products may be used:     -  RBC transfusion threshold: transfuse 2 units for Hgb < 8 g/dL.  -  Platelet transfusion threshold: transfuse 1 unit of platelets for platelet count < 10, or for bleeding or need for invasive procedure.    Based on the patient's disease status and intensity of therapy, complete blood count with differential should be evaluated 2 times per week and used to guide transfusion support    3. Hematopoietic growth factor support: none    Hospital Course:   [name]??is a 67 y.o.??male??with PMHx of HTN, T2DM, CKD-III who presented to [facility] as a transfer from OSH??with CN aortic valve endocarditis and monocytic AML.     Culture-Negative Aortic Valve Endocarditis:??Patient met sepsis criteria on admission to OSH and source was felt to be multiple dental abscesses seen on CT MaxFac; no other plausible source found. Multiple sets of blood cultures NG at 5 days, however he was taking oral amoxicillin on admission. Serologies for HACEK organisms??were done and were negative. TEE was performed on 11/14 that showed medium-sized (0.6cm x0.9cm) vegetation on the left ventricular aspect of the left coronary cusp; TTE here 11/25 confirmed. S/p broad spectrum abx at OSH from 11/8- 11/21; vanc/ceftriaxone continued here per ICID recs. No signs of HF. 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  Patient initially continued to fever intermittently during admission and infectious workup was repeated and continued to be negative. Dentistry evaluated the patient and pulled 4 teeth on 11/27. Chemotherapy was started 11/27 as well. Fever curve subsequently improved, though one more fever on 12/2 with NG blood cultures. PICC line placed on 12/4.  -- IV vanc and Ceftriaxone through 12/24 (4 week course)  ??  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 3q chromosome. FLT3 negative.??PET/CT 11/26 with diffuse uptake but limited to bone marrow. Using the MRC prognostic model, he should be considered high risk. Started Aza/venetoclax C1D1 11/27.  Patient subsequently improved significantly in terms of fevers, weakness, tachycardia, delirium, TLS/DIC labs. He will continue venetoclax outpatient. He will get follow-up bone marrow biopsy week of 12/23. Cycle 2 of chemotherapy will start around 1/2. He was discharged on valtrex, and IV micafungin to SNF. Discharge ANC 0.1. Will need assessed for antimicrobial ppx when off abx.   - myeloid mutation panel sent   ??  Sinus Tachycardia: Patient presented with intermittent sinus tachycardia to 110s. 11/25 HR increased to 120s-140s; all other VSS, EKG with sinus tach. No new O2 requirement. Non-toxic appearing on exam.  Likely due to his underlying disease process, also in setting of extremely poor PO intake, dry on exam. Improved with chemo, IV fluids.   ??  Bilateral Lower Extremity Weakness:?? MRI T/L Spine done at OSH showed neural foraminal narrowing at L3-4 and L5-S1 (moderate on the right side). Also showed diffusely abnormal bone marrow signal in the spine concerning for infiltrative tumor, PET/CT verified these findings. No canal narrowing. Strength relatively  symmetric, with mild diffuse weakness, seemingly pain-related, though patient able to ambulate. ICID recommended vertebral biopsy, but this was deferred given diffuse skeletal uptake and no clear isolated potential infectious source. Question is whether this could be due to CNS component of his leukemia, or simply due to a combination of pain and deconditioning secondary to his illness.  LP attempted 11/24 but patient could not tolerate presumably due to discomfort with positioning. Patient improved during admission with onset of chemotherapy. Going to SNF for further rehab.  ??  Delirium: On admission patient intermittently A&Ox3, but at night tended to get agitated and combative when care attempted. Patient notably has underlying brain injury, previous strokes. This in combo with current illnesses make him high risk for recurrent delirium. Improved significantly after chemo started. Discharged on Zyprexa 2.5 mg QPM. ??  T2DM:??Home regimen is Lantus 42u nightly, empagliflozin 10mg  daily, and metformin 1000mg  BID. Last HbA1c 6.7% in August 2019. Patient was given Lantus and mealtime lispro titrated to glucoses as he had variable PO intake and consumed mostly sugary foods. Held home metformin, empagliflozin. Discharged to SNF on lantus 15 units nightly with sliding scale at the SNF. The goal would be that as he begins to feel better his intake would improve and his insulin would need titrated appropriately. The goal is to get him back to home dose of his lantus with PO intake improved and not d/c on sliding scale.  ??  AKI on CKD-III:??Creatinine varies from 0.9-1.4. Closer to 0.8-0.9 on admission to OSH, up-trended to ~1.6 on admission to [facility], and was stable throughout admission despite IV fluids. Could be patient's new baseline. No evidence of TLS. Home lisinopril and HCTZ held throughout admission. Discharge creatinine 1.46.  ??  Hypertension:??Home regimen is HCTZ 12.5mg  daily, lisinopril 20mg  daily (held due to AKI).??Was getting metoprolol tartrate as substitute for HCTZ at OSH, as well as diltiazem. We favored starting amlodipine 10mg  daily over these options. Discharged on amlodipine 10mg  daily with good pressure control.    Procedures:  PICC line and Chemotherapy  No admission procedures for hospital encounter.  ______________________________________________________________________  Discharge Medications:     Your Medication List      STOP taking these medications    aspirin 81 MG tablet  Commonly known as:  ECOTRIN     BASAGLAR KWIKPEN U-100 INSULIN 100 unit/mL (3 mL) injection pen  Generic drug:  insulin glargine  Replaced by:  insulin glargine 100 unit/mL injection     diltiazem 30 MG tablet  Commonly known as:  CARDIZEM     empagliflozin 10 mg Tab     hydroCHLOROthiazide 12.5 MG tablet  Commonly known as:  HYDRODIURIL     lisinopril 20 MG tablet  Commonly known as:  PRINIVIL,ZESTRIL     metFORMIN 1000 MG tablet  Commonly known as:  GLUCOPHAGE     simvastatin 5 MG tablet  Commonly known as:  ZOCOR  Replaced by:  pravastatin 10 MG tablet        START taking these medications    amLODIPine 10 MG tablet  Commonly known as:  NORVASC  Take 1 tablet (10 mg total) by mouth daily.     cefTRIAXone 2 g in sodium chloride 0.9 % 0.9 % 100 mL IVPB  Infuse 2 g into a venous catheter daily. for 14 days     insulin glargine 100 unit/mL injection  Commonly known as:  LANTUS  Inject 0.15 mL (15 Units total) under the skin nightly.  Replaces:  Mariella Saa  KWIKPEN U-100 INSULIN 100 unit/mL (3 mL) injection pen     insulin lispro 100 unit/mL injection  Commonly known as:  HumaLOG  Inject 0-0.12 mL (0-12 Units total) under the skin Four (4) times a day (before meals and nightly). See discharge summary for titration.     micafungin 50 mg, OVERFILL 10 mL in sodium chloride 0.9 % 100 mL IVPB  Infuse 50 mg into a venous catheter daily.     mirtazapine 7.5 MG tablet  Commonly known as:  REMERON  Take 1 tablet (7.5 mg total) by mouth nightly.     OLANZapine zydis 5 MG disintegrating tablet  Commonly known as:  ZyPREXA  Take 0.5 tablets (2.5 mg total) by mouth nightly.     ondansetron 4 MG disintegrating tablet  Commonly known as:  ZOFRAN-ODT  Take 1 tablet (4 mg total) by mouth every eight (8) hours as needed for nausea.     pravastatin 10 MG tablet  Commonly known as:  PRAVACHOL  Take 1 tablet (10 mg total) by mouth nightly.  Replaces:  simvastatin 5 MG tablet     valACYclovir 500 MG tablet  Commonly known as:  VALTREX  Take 1 tablet (500 mg total) by mouth daily.     vancomycin 1 gram/200 mL IVPB  Commonly known as:  VANCOCIN  Infuse 200 mL (1,000 mg total) into a venous catheter daily. for 14 days     VENCLEXTA 100 mg tablet  Generic drug:  venetoclax  Take 4 tablets (400 mg total) by mouth daily with a meal and water. Do not chew, crush, or break tablets.            Allergies:  Patient has no known allergies. ______________________________________________________________________  Pending Test Results (if blank, then none):   Order Current Status    Myeloid Mutation Panel - AML In process    Cytogenetics AP Order Preliminary result    Hematopathology Order Edited          Most Recent Labs:  All lab results last 24 hours -   Recent Results (from the past 24 hour(s))   POCT Glucose    Collection Time: 11/12/18 11:24 AM   Result Value Ref Range    Glucose, POC 255 (H) 65 - 179 mg/dL   POCT Glucose    Collection Time: 11/12/18  4:39 PM   Result Value Ref Range    Glucose, POC 152 65 - 179 mg/dL   POCT Glucose    Collection Time: 11/12/18  8:39 PM   Result Value Ref Range    Glucose, POC 108 65 - 179 mg/dL   Comprehensive Metabolic Panel    Collection Time: 11/13/18  2:30 AM   Result Value Ref Range    Sodium 135 135 - 145 mmol/L    Potassium 4.1 3.5 - 5.0 mmol/L    Chloride 101 98 - 107 mmol/L    CO2 27.0 22.0 - 30.0 mmol/L    BUN 17 7 - 21 mg/dL    Creatinine 5.40 (H) 0.70 - 1.30 mg/dL    BUN/Creatinine Ratio 12     EGFR CKD-EPI Non-African American, Male 50 (L) >=60 mL/min/1.75m2    EGFR CKD-EPI African American, Male 58 (L) >=60 mL/min/1.36m2    Glucose 141 65 - 179 mg/dL    Calcium 7.8 (L) 8.5 - 10.2 mg/dL    Albumin 2.7 (L) 3.5 - 5.0 g/dL    Total Protein 5.4 (L) 6.5 - 8.3 g/dL    Total Bilirubin  0.6 0.0 - 1.2 mg/dL    AST 17 (L) 19 - 55 U/L    ALT 14 <50 U/L    Alkaline Phosphatase 499 (H) 38 - 126 U/L    Anion Gap 7 7 - 15 mmol/L   CBC w/ Differential    Collection Time: 11/13/18  2:30 AM   Result Value Ref Range    WBC 0.5 (L) 4.5 - 11.0 10*9/L    RBC 2.90 (L) 4.50 - 5.90 10*12/L    HGB 8.7 (L) 13.5 - 17.5 g/dL    HCT 16.1 (L) 09.6 - 53.0 %    MCV 84.3 80.0 - 100.0 fL    MCH 30.0 26.0 - 34.0 pg    MCHC 35.6 31.0 - 37.0 g/dL    RDW 04.5 (H) 40.9 - 15.0 %    MPV 9.1 7.0 - 10.0 fL    Platelet 31 (L) 150 - 440 10*9/L    Variable HGB Concentration Slight (A) Not Present    Neutrophils % 17.6 %    Lymphocytes % 74.5 % Monocytes % 4.1 %    Eosinophils % 0.5 %    Basophils % 0.0 %    Absolute Neutrophils 0.1 (LL) 2.0 - 7.5 10*9/L    Absolute Lymphocytes 0.3 (L) 1.5 - 5.0 10*9/L    Absolute Monocytes 0.0 (L) 0.2 - 0.8 10*9/L    Absolute Eosinophils 0.0 0.0 - 0.4 10*9/L    Absolute Basophils 0.0 0.0 - 0.1 10*9/L    Large Unstained Cells 3 0 - 4 %    Hyperchromasia Slight (A) Not Present   POCT Glucose    Collection Time: 11/13/18  7:15 AM   Result Value Ref Range    Glucose, POC 117 65 - 179 mg/dL       Relevant Studies/Radiology (if blank, then none):  Xr Chest Portable    Result Date: 11/04/2018  EXAM: XR CHEST PORTABLE DATE: 11/04/2018 7:50 PM ACCESSION: [id number] UN DICTATED: 11/04/2018 7:55 PM INTERPRETATION LOCATION: [facility] CLINICAL INDICATION: 67 years old Male with FEVER  COMPARISON: 10/26/2018 TECHNIQUE: Portable Chest Radiograph. FINDINGS: Interval insertion of right IJ central venous catheter tip terminating at the superior cavoatrial junction. Lungs radiographically clear. No pleural effusion or pneumothorax Stable cardiomediastinal silhouette. Aortic calcifications.     Interval insertion of right IJ central venous catheter tip terminating at the superior cavoatrial junction. No pneumothorax.    Xr Chest 2 Views    Result Date: 10/26/2018  EXAM: XR CHEST 2 VIEWS DATE: 10/26/2018 8:34 AM ACCESSION: [id number] UN DICTATED: 10/26/2018 8:35 AM INTERPRETATION LOCATION: [facility] CLINICAL INDICATION: 67 years old Male with Baseline prior to chemo  COMPARISON: None TECHNIQUE: PA and Lateral Chest Radiographs. FINDINGS: Radiographically clear lungs. No pleural effusion or pneumothorax. Unremarkable cardiomediastinal silhouette.     Clear lungs.     Echocardiogram W Colorflow Spectral Doppler    Result Date: 10/29/2018  ?? Technically difficult study due to chest wall/lung interference- study terminated early due to patient request ?? There is a small mobile echodensity noted on the non-coronary cusp of the aortic valve. (please see detail bleow) ?? Hyperdynamic left ventricular systolic function, ejection fraction > 70% ?? Aortic sclerosis ?? Normal right ventricular systolic function      Ir Insert Non Tunneled Catheter (age Greater Than 5 Years)    Result Date: 11/05/2018  EXAM: TEMPORARY, NON-TUNNELED TRIPLE LUMEN CATHETER PLACEMENT - ULTRASOUND AND FLUOROSCOPIC-GUIDED DATE: 10/29/2018 9:10 AM ACCESSION: [id number] UN DICTATED: 11/05/2018 5:08 PM INTERPRETATION LOCATION: [facility] CLINICAL INDICATION: 67  years old Male with AML requiring temporary central venous access for induction chemotherapy. CONSENT: Informed consent was obtained from the patient/patient's health care proxy including a discussion of the alternatives, benefits, and risks including but not limited to infection, bleeding, and/or need for additional procedure. ULTRASOUND EVALUATION: With the patient in the supine position, the right neck and upper chest were prepped and draped in a sterile manner. The right internal jugular vein was evaluated by ultrasound and was compressible and patent. An ultrasound image was saved and sent to PACS. An appropriate skin entry site was identified using ultrasound and anesthetized with 1% lidocaine. TRIPLE LUMEN CATHETER PLACEMENT: A small skin incision was made, through which, the right internal jugular vein was accessed using a 21-G needle under ultrasound guidance. The needle was removed over a 0.018 inch guidewire.  The 0.018 inch guidewire was exchanged for a 0.032 inch guidewire using a 5 French micropuncture catheter under fluoroscopy. Under fluoroscopy, the entry site was dilated, and a 7-French non-tunneled triple lumen catheter was placed. A fluoroscopic image was obtained which confirmed the tip of the catheter in the proximal right atrium. Each lumen of the catheter was aspirated and flushed freely. It was then instilled with appropriate volume of heparin 100 units/mL. The catheter was sutured to the skin with 2-0 Ethilon and dressed in a sterile manner. SEDATION: None FLUOROSCOPY TIME: 0.1 minutes EXPOSURES: 0 CUMULATIVE DOSE: 0.2 mGy     Successful placement of a 7-F, non-tunneled triple lumen catheter in the right internal jugular vein under ultrasound and fluoroscopy.     Mri Neuro Interpretation Of Outside Film Corky Sox)    Result Date: 10/29/2018  EXAM: Magnetic resonance imaging, spinal canal and contents, thoracic, lumbar, without contrast material. DATE: 10/28/2018 12:31 PM ACCESSION: [id number] UN DICTATED: 10/29/2018 11:27 AM INTERPRETATION LOCATION: 54. CLINICAL INDICATION: 67 years old Male with C92.00 - Acute myeloid leukemia not having achieved remission (CMS - [name])  COMPARISON: None TECHNIQUE: Multiplanar MRI was performed through the thoracic and lumbar spine without intravenous contrast. FINDINGS: Diffuse metastatic disease is seen affecting almost all vertebral bodies in the thoracic and lumbar spine. There is no evidence of fractures, extension into the epidural space, spinal canal narrowing, or cord compression at this time. Signal intensity from the spinal cord is unremarkable.     Diffuse metastatic disease with no evidence of acute fractures or cord compression.    Echo Outside Study For Continued Care    Result Date: 10/30/2018  These images were imported for continued care purposes only. They will not be interpreted and no charges will apply.     Echo Outside Study For Continued Care    Result Date: 10/30/2018  These images were imported for continued care purposes only. They will not be interpreted and no charges will apply.     Xr Outside Film For Continued Care    Result Date: 10/26/2018  These images were imported for continued care purposes only. They will not be interpreted and no charges will apply.     Ct Body Outside Film For Continued Care    Result Date: 10/26/2018  These images were imported for continued care purposes only. They will not be interpreted and no charges will apply.     Ct Body Outside Film For Continued Care    Result Date: 10/26/2018  These images were imported for continued care purposes only. They will not be interpreted and no charges will apply.     Ct Neuro Outside Film For Continued Care  Result Date: 10/26/2018  These images were imported for continued care purposes only. They will not be interpreted and no charges will apply.     Ct Neuro Outside Film For Continued Care    Result Date: 10/26/2018  These images were imported for continued care purposes only. They will not be interpreted and no charges will apply.     Ct Neuro Outside Film For Continued Care    Result Date: 10/26/2018  These images were imported for continued care purposes only. They will not be interpreted and no charges will apply.     Mri Neuro Outside Film For Continued Care    Addendum Date: 10/28/2018    Report Replacement: The images for the original accession number [id number] Bethann Humble  are now associated with [id number] UN . lpb     Result Date: 10/28/2018  These images were imported for continued care purposes only. They will not be interpreted and no charges will apply.     Mri Neuro Outside Film For Continued Care    Result Date: 10/26/2018  These images were imported for continued care purposes only. They will not be interpreted and no charges will apply.     [place] Outside Film For Continued Care    Addendum Date: 10/26/2018    There are no images associated with this order. drs    Result Date: 10/26/2018  These images were imported for continued care purposes only. They will not be interpreted and no charges will apply.     Pet Ct Skull Base To Thigh    Result Date: 10/30/2018  EXAM: Positron emission tomography (PET) with concurrently acquired computed tomography (CT) for attenuation correction and anatomic localization: PET CT SKULL BASE TO THIGH DATE: 10/30/2018 ACCESSION: [id number] UN DICTATED: 10/30/2018 3:48 PM INTERPRETATION LOCATION: [facility] CLINICAL INDICATION: 67 years old Male: AML vs BPDCN, c/f spinal infxn.  Initial treatment strategy. RADIOPHARMACEUTICAL: F-18 Fluorodeoxyglucose (FDG), IV  TECHNIQUE: Following the administration of radiopharmaceutical, PET images were acquired using 3D-acquisition and reconstructed with attenuation-correction.  A single-breathhold CT scan was obtained at quiet end-expiration without oral or IV contrast for anatomic localization and attenuation-correction. The coregistered PET and CT images were evaluated in axial, coronal, and sagittal planes.  Scanner: [employer] Serum glucose: 201 mg/dL Injected activity: 46.9 mCi Site of injection: Accessed port Uptake time: 60 minutes COMPARISON: MR spine 10/19/2018, CT abdomen 10/20/2018  FINDINGS: Head/Neck: - No abnormal focal radiotracer uptake. Motion of the head between PET and CT, limiting evaluation in this area.  Chest: - Axillae: No adenopathy - Lungs: Indeterminate 0.6 cm right lower lobe pulmonary nodule (CT 77), grossly unchanged compared to CT at 10/20/18, likely below the resolution of PET. - Mediastinum/hila: No adenopathy. Right chest wall port catheter tip at the cavoatrial junction. - Pleura: No effusions  Abdomen/Pelvis: - Liver: No focal abnormality - Gallbladder: Unremarkable - Spleen: No splenomegaly. No focal abnormalities. - Pancreas: No focal abnormality - Adrenal glands: Increased uptake in bilateral adrenals with thickening of the left adrenal, likely benign. - Kidneys: Unremarkable - GI Tract: Unremarkable - GU Tract: Unremarkable - Adenopathy: None  MUSCULOSKELETAL: - Diffuse osseous uptake and sclerotic changes throughout the entire skeleton.      - Diffuse uptake and sclerotic changes throughout the visualized skeleton, suspicious for metastatic malignancy vs lymphoproliferative disorder. - 0.6 cm right lower lobe pulmonary nodule likely below the resolution of PET, indeterminate.     ______________________________________________________________________  Discharge Instructions:   Please make sure you have a functioning thermometer at home.  If you are feeling poorly, especially if you have chills, shaking, muscle aches or lightheadedness, measure your temperature. If it is more than 100.5 Farenheit, call the nurse triage line during daytime hours (Monday through Friday 8AM???5PM: [phone]) or on nights and weekends, the on-call doctor by calling the hospital operator [phone]) and asking for the on-call adult oncologist. Alternatively, since fever after chemotherapy may be a medical emergency, you may proceed directly to your local emergency room. Inform your provider that you recently received chemotherapy. You may have blood drawn for blood cultures and receive IV antibiotics.    Following discharge from the hospital if you notice the development or worsening of any symptoms such as nausea, vomiting, chest pain, shortness of breath, fevers, or chills, please return to the emergency department.      If you develop these symptoms, or if you have trouble obtaining any of your medications you may call the [facility] Communication Center to speak with the triage team at [phone] if Monday through Friday 8am-5pm or call [phone] after hours.      For appointments & questions Monday through Friday 8 AM??? 5 PM   please call [phone] or Toll free [phone].    On Nights, Weekends and Holidays  Call [phone] and ask for the oncologist on call.    [facility]  [address]  [place], [place] [address]  [email or web address]                   Follow Up instructions and Outpatient Referrals     Call MD for:  difficulty breathing, headache or visual disturbances      Call MD for:  extreme fatigue      Call MD for:  hives      Call MD for:  persistent dizziness or light-headedness      Call MD for:  persistent nausea or vomiting      Call MD for:  redness, tenderness, or signs of infection (pain, swelling, redness, odor or green/yellow discharge around incision site) Call MD for:  severe uncontrolled pain      Call MD for: Temperature > 38.5 Celsius ( > 101.3 Fahrenheit)      Discharge instructions      York Grice, you have been hospitalized for treatment of newly diagnosed Acute Myeloid Leukemia.  You received chemotherapy with azacitadine and venetoclax.  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 For this you are on IV antibiotics that will go through 12/24. Because your sugars have been low in the hospital, we are discharging you on a decreased dose of your home insulin, 15 units of lantus to take at night. This is due to decreased appetite and food intake, for which we are giving you an appetite stimulant medication known as remeron.  After discharge, it is important to follow the instructions below.    I am prescribing the following:  - Valtrex 500 mg by mouth once daily. This medication will help prevent shingles.  - You will get IV micafungin to prevent fungal infections while you are at the rehab facility.    Your blood counts are still recovering, so it is good to adhere to the following precautions:    - Wash your hands frequently with soap and water  - Take your temperature when you have chills or are not feeling well  - Do not get cuts, punctures or scratches on your  skin  - Use a soft toothbrush. Use an oral swab or special soft toothbrush if your gums bleed during regular brushing.  - Prevent constipation  - Drink lots of fluids (water, juice, etc.) to prevent dehydration  - Avoid people who have colds or the flu  - Do not visit crowded areas such as malls or movie theaters  - Avoid anyone who has received a live vaccination (shot) within the last three weeks  - Maintain a well-balanced diet and eat healthy foods, but avoid raw or uncooked foods, including raw vegetables and fruits  - Speak with your doctor before having any dental work done  - Limit exposure to pet feces (e.g., litter box)  - Do only as much activity as you can tolerate    Other instructions:  - Don't use dental floss if your platelet count is below 50,000. Your doctor or nurse will tell you if this is the case.  - Use any mouthwashes given to you as directed.  - If you can't tolerate regular methods, use salt and baking soda to clean your mouth. Mix 1 teaspoon(s) of salt and 1 teaspoon(s) of baking soda into an 8-ounce glass of warm water. Swish and spit.  - Watch your mouth and tongue for white patches. This is a sign of fungal infection, a common side effect of chemotherapy. Be sure to tell your doctor about these patches. Medication can be prescribed to help you fight the fungal infection.     You are scheduled to see Dr. Orlie Dakin [facility] oncology) at [facility] on 12/12, and NP Silvestre Moment (nurse practitioner for [facility] oncology) at [facility] on 12/18.   You are also scheduled to have a bone marrow biopsy on 12/23. You will then meet with Dr. Vertell Limber [facility] oncology) on 12/26.    *Resume your other home medications. If you have questions or concerns you can contact the nurse triage line at the numbers below.    Please make sure you have a functioning thermometer at home.?? If you are feeling poorly, especially if you have chills, shaking, muscle aches or lightheadedness, measure your temperature. If it is more than 100.4 Farenheit, call the nurse triage line during daytime hours (Monday through Friday 8AM-5PM: [phone]) or on nights and weekends, the on-call doctor by calling the hospital operator [phone]) and asking for the on-call adult oncologist. Alternatively, since fever after chemotherapy may be a medical emergency, you may proceed directly to your local emergency room. Inform your provider that you recently received chemotherapy. You may have blood drawn for blood cultures and receive IV antibiotics.    Following discharge from the hospital if you notice the development or worsening of any symptoms such as nausea, vomiting, chest pain, shortness of breath, fevers, or chills, please return to the emergency department.??     If you develop these symptoms, or if you have trouble obtaining any of your medications you may call the [facility] Communication Center to speak with the triage team at [phone] if Monday through Friday 8am-5pm or call [phone] after hours.      For appointments & questions Monday through Friday 8 AM- 5 PM   please call [phone] or Toll free [phone].    On Nights, Weekends and Holidays  Call [phone] and ask for the oncologist on call.    [facility]  [address]  [place], [place] [address]  [email or web address]               Appointments which  have been scheduled for you    Nov 21, 2018 11:15 AM EST  (Arrive by 10:45 AM)  LAB ONLY [place] with ADULT ONC LAB  [facility] ADULT ONCOLOGY LAB DRAW STATION [place] [facility] REGION) [address]  [place] [address]  [phone]      Nov 21, 2018 12:00 PM EST  (Arrive by 11:30 AM)  NEW GENERAL with Malva Cogan, CPP  [place] HEMATOLOGY ONCOLOGY 2ND FLR CANCER HOSP [facility] REGION) [address]  [place] [address]  [phone]      Nov 21, 2018  1:00 PM EST  (Arrive by 12:30 PM)  EMERGENT [place] with Thyra Breed, NP  [facility] HEMATOLOGY ONCOLOGY 2ND FLR CANCER HOSP [facility] REGION) [address]  [place] [address]  [phone]      Nov 26, 2018  1:00 PM EST  (Arrive by 12:30 PM)  NURSE LAB DRAW with ADULT ONC LAB  [facility] ADULT ONCOLOGY LAB DRAW STATION [facility] REGION) [address]  [place] [address]  [phone]      Nov 26, 2018  2:00 PM EST  (Arrive by 1:30 PM)  BONE MARROW BIOPSY with Darel Hong, PA  [place] ONCOLOGY INFUSION [facility] REGION) [address]  [place] [address]  [phone]      Nov 27, 2018  9:30 AM EST  (Arrive by 9:00 AM) RETURN FOLLOW UP [place] with Park Breed, MD  [facility] HEMATOLOGY ONCOLOGY 2ND FLR CANCER HOSP [facility] REGION) [address]  [place] [address]  [phone]      Nov 29, 2018  8:15 AM EST  (Arrive by 7:45 AM)  LAB ONLY [place] with ADULT ONC LAB  [facility] ADULT ONCOLOGY LAB DRAW STATION [facility] REGION) [address]  [place] [address]  [phone]      Nov 29, 2018  9:00 AM EST  (Arrive by 8:30 AM)  NEW HEM MALIG ONC [place] with Avie Arenas, MD  [facility] HEMATOLOGY ONCOLOGY 2ND FLR CANCER HOSP [facility] REGION) [address]  [place] [address]  [phone]       Additional instructions:      You have a follow up appointment scheduled with Dr. Orlie Dakin on 11/15/18 @ 8:45AM.   Please call him at [phone] if you need to reschedule this appointment.    Jeralyn Ruths, MD??   [address]   [place], [place] [address]??                     ______________________________________________________________________  Discharge Day Services:  BP 141/67  - Pulse 115  - Temp 36.8 ??C (Oral)  - Resp 18  - Ht 177.8 cm (5' 10)  - Wt 59.6 kg (131 lb 6.3 oz)  - SpO2 100%  - BMI 18.85 kg/m??   Pt seen on the day of discharge and determined appropriate for discharge.    Condition at Discharge: stable    Length of Discharge: I spent greater than 30 mins in the discharge of this patient.    Harland Dingwall, MD  PGY-1 Internal Medicine Resident

## 2018-11-13 NOTE — Unmapped (Signed)
Pt. Free of falls or injury. VSS.  Patient had uneventful night,

## 2018-11-13 NOTE — Unmapped (Signed)
VSS, pt free from injuries/falls.  Pt denies concerns/complaints.  He is planning to d/c to SNF tomorrow per case mgmt and E1 intern.  WCTM.

## 2018-11-13 NOTE — Unmapped (Signed)
SBAR report given to Vance Gather, Charity fundraiser at [employer], all questions answered.

## 2018-11-14 DIAGNOSIS — I330 Acute and subacute infective endocarditis: Secondary | ICD-10-CM | POA: Diagnosis not present

## 2018-11-14 DIAGNOSIS — E1122 Type 2 diabetes mellitus with diabetic chronic kidney disease: Secondary | ICD-10-CM | POA: Diagnosis not present

## 2018-11-14 DIAGNOSIS — R410 Disorientation, unspecified: Secondary | ICD-10-CM | POA: Diagnosis not present

## 2018-11-14 DIAGNOSIS — C7951 Secondary malignant neoplasm of bone: Secondary | ICD-10-CM | POA: Diagnosis not present

## 2018-11-14 DIAGNOSIS — R000 Tachycardia, unspecified: Secondary | ICD-10-CM | POA: Diagnosis not present

## 2018-11-14 DIAGNOSIS — N183 Chronic kidney disease, stage 3 (moderate): Secondary | ICD-10-CM | POA: Diagnosis not present

## 2018-11-14 DIAGNOSIS — C9300 Acute monoblastic/monocytic leukemia, not having achieved remission: Secondary | ICD-10-CM | POA: Diagnosis not present

## 2018-11-14 DIAGNOSIS — Z794 Long term (current) use of insulin: Secondary | ICD-10-CM | POA: Diagnosis not present

## 2018-11-14 NOTE — Unmapped (Signed)
Hi,     Nurse April Hatcher with [facility] contacted the Communication Center requesting to speak with the care team of Kyle Huffman to discuss:    Questions about discharge instructions regarding vancomycin.    Please contact Nurse Hatcher at [phone].    Program: Heme Malignancy  Speciality: Medical Oncology    Check Indicates criteria has been reviewed and confirmed with the patient:    []  Preferred Name   [x]  DOB and/or MR#  [x]  Preferred Contact Method  [x]  Phone Number(s)   []  MyChart     Thank you,   Kelli Hope  [facility] Communication Center   [phone]

## 2018-11-14 NOTE — Unmapped (Addendum)
Spoke w/April RN regarding POC following discharge. She had already spoke with inpatient team regarding vancomycin dosing and was clear on that. However, in review of patient I noted increased renal function at with creatinine 1.43 at discharge. Nurse will not be obtaining CBC/CMP/vanc trough until Monday 12/16. Reviewed with Dr. Vertell Limber and Kendal Hymen increased kidney function while on vanc, request for repeat labs (CBC/CMP/vanc trough) to be drawn Friday 12/13 as well as 12/16. Results to be faxed to nurse navigator KJ [name] at [phone]. Instructions given for how to page fellow on call with critical results if they result after hours.

## 2018-11-15 ENCOUNTER — Inpatient Hospital Stay: Payer: Medicare HMO

## 2018-11-15 ENCOUNTER — Inpatient Hospital Stay: Payer: Medicare HMO | Admitting: Oncology

## 2018-11-15 ENCOUNTER — Other Ambulatory Visit: Payer: Self-pay

## 2018-11-15 ENCOUNTER — Other Ambulatory Visit: Payer: Self-pay | Admitting: *Deleted

## 2018-11-15 ENCOUNTER — Inpatient Hospital Stay: Payer: Medicare HMO | Attending: Oncology | Admitting: Oncology

## 2018-11-15 VITALS — BP 100/64 | HR 121 | Temp 98.1°F | Resp 18 | Ht 70.0 in | Wt 143.0 lb

## 2018-11-15 DIAGNOSIS — Z79899 Other long term (current) drug therapy: Secondary | ICD-10-CM

## 2018-11-15 DIAGNOSIS — D696 Thrombocytopenia, unspecified: Secondary | ICD-10-CM

## 2018-11-15 DIAGNOSIS — D61818 Other pancytopenia: Secondary | ICD-10-CM | POA: Diagnosis not present

## 2018-11-15 DIAGNOSIS — D649 Anemia, unspecified: Secondary | ICD-10-CM

## 2018-11-15 DIAGNOSIS — C864 Blastic NK-cell lymphoma: Secondary | ICD-10-CM | POA: Insufficient documentation

## 2018-11-15 DIAGNOSIS — D709 Neutropenia, unspecified: Secondary | ICD-10-CM | POA: Diagnosis not present

## 2018-11-15 DIAGNOSIS — Z87891 Personal history of nicotine dependence: Secondary | ICD-10-CM | POA: Diagnosis not present

## 2018-11-15 LAB — COMPREHENSIVE METABOLIC PANEL
ALT: 24 U/L (ref 0–44)
AST: 24 U/L (ref 15–41)
Albumin: 2.8 g/dL — ABNORMAL LOW (ref 3.5–5.0)
Alkaline Phosphatase: 498 U/L — ABNORMAL HIGH (ref 38–126)
Anion gap: 9 (ref 5–15)
BUN: 15 mg/dL (ref 8–23)
CO2: 22 mmol/L (ref 22–32)
Calcium: 8.4 mg/dL — ABNORMAL LOW (ref 8.9–10.3)
Chloride: 105 mmol/L (ref 98–111)
Creatinine, Ser: 1.57 mg/dL — ABNORMAL HIGH (ref 0.61–1.24)
GFR calc Af Amer: 52 mL/min — ABNORMAL LOW (ref >60)
GFR calc non Af Amer: 45 mL/min — ABNORMAL LOW (ref >60)
Glucose, Bld: 203 mg/dL — ABNORMAL HIGH (ref 70–99)
Potassium: 4.3 mmol/L (ref 3.5–5.1)
Sodium: 136 mmol/L (ref 135–145)
TOTAL PROTEIN: 6.3 g/dL — AB (ref 6.5–8.1)
Total Bilirubin: 0.9 mg/dL (ref 0.3–1.2)

## 2018-11-15 LAB — CBC WITH DIFFERENTIAL/PLATELET
Abs Immature Granulocytes: 0 10*3/uL (ref 0.00–0.07)
Basophils Absolute: 0 10*3/uL (ref 0.0–0.1)
Basophils Relative: 0 %
Eosinophils Absolute: 0 10*3/uL (ref 0.0–0.5)
Eosinophils Relative: 0 %
HCT: 23.1 % — ABNORMAL LOW (ref 39.0–52.0)
Hemoglobin: 7.7 g/dL — ABNORMAL LOW (ref 13.0–17.0)
Immature Granulocytes: 0 %
LYMPHS ABS: 0.5 10*3/uL — AB (ref 0.7–4.0)
Lymphocytes Relative: 82 %
MCH: 28.3 pg (ref 26.0–34.0)
MCHC: 33.3 g/dL (ref 30.0–36.0)
MCV: 84.9 fL (ref 80.0–100.0)
Monocytes Absolute: 0 10*3/uL — ABNORMAL LOW (ref 0.1–1.0)
Monocytes Relative: 7 %
Neutro Abs: 0.1 10*3/uL — ABNORMAL LOW (ref 1.7–7.7)
Neutrophils Relative %: 11 %
Platelets: 38 10*3/uL — ABNORMAL LOW (ref 150–400)
RBC: 2.72 MIL/uL — ABNORMAL LOW (ref 4.22–5.81)
RDW: 13.9 % (ref 11.5–15.5)
WBC: 0.6 10*3/uL — CL (ref 4.0–10.5)
nRBC: 0 % (ref 0.0–0.2)

## 2018-11-15 LAB — SAMPLE TO BLOOD BANK

## 2018-11-15 LAB — ABO/RH: ABO/RH(D): A POS

## 2018-11-15 MED ORDER — SODIUM CHLORIDE 0.9% IV SOLUTION
250.0000 mL | Freq: Once | INTRAVENOUS | Status: AC
  Administered 2018-11-15: 250 mL via INTRAVENOUS
  Filled 2018-11-15: qty 250

## 2018-11-15 MED ORDER — DIPHENHYDRAMINE HCL 50 MG/ML IJ SOLN
25.0000 mg | Freq: Once | INTRAMUSCULAR | Status: AC
  Administered 2018-11-15: 25 mg via INTRAVENOUS
  Filled 2018-11-15: qty 1

## 2018-11-15 MED ORDER — ACETAMINOPHEN 325 MG PO TABS
650.0000 mg | ORAL_TABLET | Freq: Once | ORAL | Status: AC
  Administered 2018-11-15: 650 mg via ORAL
  Filled 2018-11-15: qty 2

## 2018-11-15 MED ORDER — HEPARIN SOD (PORK) LOCK FLUSH 100 UNIT/ML IV SOLN
250.0000 [IU] | INTRAVENOUS | Status: AC | PRN
  Administered 2018-11-15: 250 [IU]
  Filled 2018-11-15: qty 5

## 2018-11-15 MED ORDER — SODIUM CHLORIDE 0.9% FLUSH
10.0000 mL | INTRAVENOUS | Status: DC | PRN
  Filled 2018-11-15: qty 10

## 2018-11-15 NOTE — Progress Notes (Signed)
Nutrition Assessment   Reason for Assessment:   Verbal referral from Dr. Grayland Ormond regarding weight loss poor appetite.   ASSESSMENT:   67 year old male with new diagnosis of blastic plasmacytoid leukemia.  Past medical history of CKD stage 3, DM, aortic valve endocarditis, HTN, weakness both lower extermities. Noted recent hospital admission at [facility] then transfer to [facility].  Patient is receiving care at [facility] center and [facility]   Met with patient and brother in infusion today.  Brother reports that patient is at [facility] for rehab.  Patient reports no appetite for at least the last 3 weeks or more.  Currently drinking V-8 juice over ice and reports this is the only thing I want."  Reports takes a few bites then spits up foods (brother reports similar to a baby would spit up).  Denies nausea.  Brother reports swallow study was done at [facility] (just discharged on 12/10) which was negative per brother.  Brother reports his wife and family are bringing him anything he wants to eat.  Brought hamburger last night and took few bites, ate few bites of [place] noodle soup yesterday for lunch.  Has been drinking some boost original.    Reviewed inpatient note and noted patient reported mostly eating just 2 meals per day mostly fast foods.    Brother reports although intake not good, it has improved while at [facility] and he is getting stronger.    Medications: lantus, humalog, remeron, zyprexa, zofran   Labs: glucose 203, creatinine 1.57, hgb 7.7   Anthropometrics:   Height: 70 inches Weight: 143 lb UBW: 174 lb per patient. Noted last 175 lb on 10/09/18 BMI: 20  18% weight loss in the last month, signficant   Estimated Energy Needs  Kcals: 4403-4742 calories/d Protein: 98-114 g Fluid: 2.2 L   NUTRITION DIAGNOSIS: Unintentional weight loss related to poor appetite, cancer diagnosis, multiple hospital admissions as evidenced by 18% weight loss in the last month and eating < or equal to 50%  energy needs for > or equal to 5 days.    MALNUTRITION DIAGNOSIS: Patient meets criteria for severe malnutrition in context of acute illness likely progressing to chronic as evidenced by 18% weight loss in 1 month and eating < or equal to 50% energy needs for > or equal to 1 month.     INTERVENTION:  Discussed strategies to increase calories and protein.  Handout provided Encouraged small frequent meals Recommend high calorie shakes (350 calories + at this time) and samples given today vs glucerna/boost glucose control shakes) due to malnutrition Recommend liberalizing diet at this time due to malnutrition Contact information provided to brother   MONITORING, EVALUATION, GOAL: Patient will consume adequate calories and protein to prevent further weight loss   Next Visit: to be determined  Richard Gordon B. Zenia Resides, [place], [place] Registered Dietitian [phone] (pager)

## 2018-11-16 ENCOUNTER — Encounter (HOSPITAL_COMMUNITY): Payer: Self-pay | Admitting: Hematology and Oncology

## 2018-11-16 LAB — BPAM RBC
Blood Product Expiration Date: 202001102359
ISSUE DATE / TIME: 201912121246
Unit Type and Rh: 6200

## 2018-11-16 LAB — TYPE AND SCREEN
ABO/RH(D): A POS
Antibody Screen: NEGATIVE
Unit division: 0

## 2018-11-16 LAB — PREPARE RBC (CROSSMATCH)

## 2018-11-16 NOTE — Progress Notes (Signed)
Richard Gordon  [phone]) [phone] Fax:(336) [phone]  ID: Richard Gordon OB: January 01, 1951  MR#: [id number]  XLK#:[id number]  Patient Care Team: Leone Haven, MD as PCP - General (Family Medicine)  CHIEF COMPLAINT: Blastic plasmacytoid dendritic cell neoplasm.  INTERVAL HISTORY: Patient is a 67 year old male who was last evaluated nearly 6 months ago for thrombocytopenia who was recently admitted to [facility] and diagnosed with blastic plasmacytoid dendritic cell neoplasm.  He received a 7-day course of subcutaneous Vidaza while in the hospital at the end of November and is currently taking 400 mg venetoclax daily.  He has persistent weakness and fatigue.  He has no neurologic complaints.  He denies any rashes.  He has a fair appetite, but denies weight loss.  He has no chest pain or shortness of breath.  He denies any nausea, vomiting, constipation, or diarrhea.  He has no urinary complaints.  Patient otherwise feels well and offers no further specific complaints today.    REVIEW OF SYSTEMS:   Review of Systems  Constitutional: Negative.  Negative for fever, malaise/fatigue and weight loss.  Respiratory: Negative.  Negative for cough and shortness of breath.   Cardiovascular: Negative.  Negative for chest pain and leg swelling.  Gastrointestinal: Negative.  Negative for abdominal pain and constipation.  Genitourinary: Negative.  Negative for dysuria.  Musculoskeletal: Negative.   Skin: Negative.  Negative for rash.  Neurological: Negative.  Negative for sensory change, focal weakness and weakness.  Psychiatric/Behavioral: Negative.  The patient is not nervous/anxious.    As per HPI. Otherwise, a complete review of systems is negative.  PAST MEDICAL HISTORY: Past Medical History:  Diagnosis Date  . Chronic kidney disease (CKD), stage III (moderate) ([place]) 11/03/2015  . Diabetes mellitus without complication ([place])   . Hyperlipidemia   . Hypertension   . Stroke [facility])     per pt 2 CVA in 2000,and 1998 (  R ,then L side effected seperately per pt )    PAST SURGICAL HISTORY: Past Surgical History:  Procedure Laterality Date  . CATARACT EXTRACTION    . TEE WITHOUT CARDIOVERSION N/A 10/18/2018   Procedure: TRANSESOPHAGEAL ECHOCARDIOGRAM (TEE);  Surgeon: Teodoro Spray, MD;  Location: [facility] ORS;  Service: Cardiovascular;  Laterality: N/A;  . TONSILLECTOMY      FAMILY HISTORY: Family History  Problem Relation Age of Onset  . Hypertension Father   . Diabetes Father   . Brain cancer Mother        per pt he was 33 and thinks this was cancer   . Coronary artery disease Sister        scarlet fever as child affected heart per pt    ADVANCED DIRECTIVES (Y/N):  N  HEALTH MAINTENANCE: Social History   Tobacco Use  . Smoking status: Former Smoker    Packs/day: 0.50    Years: 25.00    Pack years: 12.50    Types: Cigarettes    Last attempt to quit: 04/04/1999    Years since quitting: 19.6  . Smokeless tobacco: Former Systems developer    Types: Snuff  Substance Use Topics  . Alcohol use: No    Alcohol/week: 0.0 standard drinks  . Drug use: No     Colonoscopy:  PAP:  Bone density:  Lipid panel:  No Known Allergies  Current Outpatient Medications  Medication Sig Dispense Refill  . amLODipine (NORVASC) 10 MG tablet Take 1 tablet by mouth daily.    . cefTRIAXone 2 g in dextrose 5 % 50 mL  Inject into the vein. in sodium chloride 0.9 % 0.9 % 100 mL IVPB    . insulin glargine (LANTUS) 100 UNIT/ML injection Inject 15 Units into the skin at bedtime.    . insulin lispro (HUMALOG) 100 UNIT/ML injection Inject into the skin.    . micafungin (MYCAMINE) 50 MG injection Inject into the vein daily. in sodium chloride 0.9 % 100 mL IVPB    . mirtazapine (REMERON) 7.5 MG tablet Take 1 tablet by mouth at bedtime.    Marland Kitchen OLANZapine zydis (ZYPREXA) 5 MG disintegrating tablet Take 1 tablet by mouth at bedtime.    . ondansetron (ZOFRAN-ODT) 4 MG disintegrating tablet Take 1  tablet by mouth every 8 (eight) hours as needed for nausea/vomiting.    . pravastatin (PRAVACHOL) 10 MG tablet Take 1 tablet by mouth daily.    . valACYclovir (VALTREX) 500 MG tablet Take 1 tablet by mouth daily.    . vancomycin (VANCOCIN) 1-5 GM/200ML-% SOLN Inject into the vein.    Marland Kitchen venetoclax 100 MG TABS Take 4 tablets by mouth daily.     No current facility-administered medications for this visit.     OBJECTIVE: Vitals:   11/15/18 0934  BP: 100/64  Pulse: (!) 121  Resp: 18  Temp: 98.1 F (36.7 C)     Body mass index is 20.52 kg/m.    ECOG FS:2 - Symptomatic, <50% confined to bed  General: Well-developed, well-nourished, no acute distress. Eyes: Pink conjunctiva, anicteric sclera. HEENT: Normocephalic, moist mucous membranes, clear oropharnyx. Lungs: Clear to auscultation bilaterally. Heart: Regular rate and rhythm. No rubs, murmurs, or gallops. Abdomen: Soft, nontender, nondistended. No organomegaly noted, normoactive bowel sounds. Musculoskeletal: No edema, cyanosis, or clubbing. Neuro: Alert, answering all questions appropriately. Cranial nerves grossly intact. Skin: No rashes or petechiae noted. Psych: Normal affect. Lymphatics: No cervical, calvicular, axillary or inguinal LAD.  LAB RESULTS:  Lab Results  Component Value Date   NA 136 11/15/2018   K 4.3 11/15/2018   CL 105 11/15/2018   CO2 22 11/15/2018   GLUCOSE 203 (H) 11/15/2018   BUN 15 11/15/2018   CREATININE 1.57 (H) 11/15/2018   CALCIUM 8.4 (L) 11/15/2018   PROT 6.3 (L) 11/15/2018   ALBUMIN 2.8 (L) 11/15/2018   AST 24 11/15/2018   ALT 24 11/15/2018   ALKPHOS 498 (H) 11/15/2018   BILITOT 0.9 11/15/2018   GFRNONAA 45 (L) 11/15/2018   GFRAA 52 (L) 11/15/2018    Lab Results  Component Value Date   WBC 0.6 (LL) 11/15/2018   NEUTROABS 0.1 (L) 11/15/2018   HGB 7.7 (L) 11/15/2018   HCT 23.1 (L) 11/15/2018   MCV 84.9 11/15/2018   PLT 38 (L) 11/15/2018     STUDIES: Ct Head Wo Contrast  Result  Date: 10/22/2018 CLINICAL DATA:  67 year old male with altered level of consciousness. Subsequent encounter. EXAM: CT HEAD WITHOUT CONTRAST TECHNIQUE: Contiguous axial images were obtained from the base of the skull through the vertex without intravenous contrast. COMPARISON:  10/19/2018 MR. 10/11/2018 and 06/21/2011 CT. FINDINGS: Brain: No intracranial hemorrhage or CT evidence of large acute infarct. Remote left parietal lobe infarct. Marked cerebellar and brainstem atrophy (may represent result of neurodegenerative disorder). Moderate supratentorial atrophy. 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 No intracranial mass lesion noted on this unenhanced exam. Vascular: Vascular calcifications Skull: No acute abnormality Sinuses/Orbits: No acute orbital abnormality. Mild mucosal thickening sphenoid sinuses. Other: Visualized mastoid air cells and middle ear cavities are  clear. IMPRESSION: 1. No intracranial hemorrhage or CT evidence of large acute infarct. 2. Remote left parietal lobe infarct. 3. Marked cerebellar and brainstem atrophy (may represent result of neurodegenerative disorder). Moderate supratentorial atrophy. 4. 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 5. Mild mucosal thickening sphenoid sinuses. Electronically Signed   By: Genia Del M.D.   On: 10/22/2018 17:45   Ct Chest Wo Contrast  Result Date: 10/20/2018 CLINICAL DATA:  Cough, fever. Possible endocarditis on TEE. Abnormal marrow signal on thoracolumbar spine MRI raises concern for malignancy. EXAM: CT CHEST WITHOUT CONTRAST TECHNIQUE: Multidetector CT imaging of the chest was performed following the standard protocol without IV contrast. COMPARISON:  Correlation with thoracolumbar spine MRI dated 10/19/2018 and recent CT abdomen/pelvis dated 10/10/2018. FINDINGS: Cardiovascular: The heart is  normal in size. No pericardial effusion. No evidence of thoracic aortic aneurysm. Atherosclerotic calcifications of the aortic arch. Three vessel coronary atherosclerosis. Mediastinum/Nodes: No suspicious mediastinal or axillary lymphadenopathy. 7 mm short axis azygoesophageal recess node (series 2/image 80), within normal limits, previously 9 mm. Visualized thyroid is unremarkable. Lungs/Pleura: Lungs are essentially clear. Mild dependent atelectasis in the bilateral lower lobes. No focal consolidation. No pleural effusion or pneumothorax. Upper Abdomen: Visualized upper abdomen is notable for mild stranding along the bilateral adrenal glands and kidneys, possibly improved from recent CT. Musculoskeletal: Diffuse/heterogeneous osseous sclerosis involving the visualized axial and appendicular skeleton including the thoracolumbar spine and sternum. IMPRESSION: Diffuse/heterogeneous osseous sclerosis involving the visualized axial and appendicular skeleton. Differential considerations include renal osteodystrophy, diffuse osseous metastases secondary to malignancy (most commonly prostate cancer), or infiltrative disorders including lymphoma. Otherwise, no findings suspicious for malignancy in the chest. Aortic Atherosclerosis (ICD10-I70.0). Electronically Signed   By: Julian Hy M.D.   On: 10/20/2018 10:15   Mr Brain Wo Contrast  Result Date: 10/19/2018 CLINICAL DATA:  Progressive leg weakness, altered mental status. Sepsis. History of stroke, hypertension, hyperlipidemia, diabetes. EXAM: MRI HEAD WITHOUT CONTRAST TECHNIQUE: Multiplanar, multiecho pulse sequences of the brain and surrounding structures were obtained without intravenous contrast. COMPARISON:  CT face October 11, 2018, MRI head March 14, 2011 FINDINGS: INTRACRANIAL CONTENTS: No reduced diffusion to suggest acute ischemia. No susceptibility artifact to suggest hemorrhage. Disproportionate severely atrophic cerebellum similar to progressed.  Moderately atrophic pons. No hydrocephalus. LEFT inferior frontal lobe encephalomalacia, given enlargement of LEFT frontal sinuses most compatible with neonatal insult. Old RIGHT thalamus infarct. Old LEFT parietal infarct with mild ex vacuo dilatation subjacent ventricle. No hydrocephalus. No midline shift, mass effect or masses. No abnormal extra-axial fluid collections. VASCULAR: Normal major intracranial vascular flow voids present at skull base. SKULL AND UPPER CERVICAL SPINE: No abnormal sellar expansion. No suspicious calvarial bone marrow signal. Craniocervical junction maintained. SINUSES/ORBITS: Mild paranasal sinus mucosal thickening without air-fluid levels. Mastoid air cells are well aerated.The included ocular globes and orbital contents are non-suspicious. Status post RIGHT ocular lens implant. OTHER: None. IMPRESSION: 1. No acute intracranial process. 2. 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 3. Old small LEFT parietal/MCA territory infarct. Old RIGHT thalamus infarct. 4. 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 Electronically Signed   By: Elon Alas M.D.   On: 10/19/2018 05:13   Mr Thoracic Spine W Wo Contrast  Result Date: 10/19/2018 CLINICAL DATA:  Mid and lower back pain, leg weakness. Sepsis. Evaluate for spinal infection. History of endocarditis, diabetes. EXAM: MRI THORACIC AND LUMBAR SPINE WITHOUT AND WITH CONTRAST TECHNIQUE: Multiplanar and multiecho pulse sequences  of the thoracic and lumbar spine were obtained without and with intravenous contrast. CONTRAST:  7 cc Gadavist COMPARISON:  CT abdomen and pelvis October 10, 2018 FINDINGS: MRI THORACIC SPINE FINDINGS ALIGNMENT: Maintenance of the thoracic kyphosis. No malalignment. VERTEBRAE/DISCS: Vertebral bodies are intact. Diffusely heterogeneous bone marrow signal with patchy bright STIR signal in superimposed irregular enhancement of nearly all  thoracic vertebral bodies. Preservation of the cortex. Intervertebral disc morphologies and signal maintained. No abnormal disc enhancement. CORD: Thoracic spinal cord is normal morphology and signal characteristics. No abnormal cord or leptomeningeal enhancement. Epidural enhancement within the included cervical spine. Minimal ventral epidural enhancement versus failure of fat saturation. PREVERTEBRAL AND PARASPINAL SOFT TISSUES:  Nonacute. DISC LEVELS: No disc bulge, canal stenosis nor neural foraminal narrowing. MRI LUMBAR SPINE FINDINGS SEGMENTATION: For the purposes of this report, the last well-formed intervertebral disc is reported as L5-S1. ALIGNMENT: Maintained lumbar lordosis. No malalignment. VERTEBRAE: Vertebral bodies are intact. Diffusely heterogeneous bone marrow signal with patchy bright STIR signal in superimposed irregular enhancement of all lumbar vertebral bodies and included sacrum. Preservation of the cortex. Intervertebral disc morphologies and signal maintained. No abnormal disc enhancement. Congenital canal narrowing on the basis of foreshortened pedicles. CONUS MEDULLARIS AND CAUDA EQUINA: Conus medullaris terminates at L1-2 and demonstrates normal morphology and signal characteristics. Cauda equina is normal. No abnormal cord, leptomeningeal or epidural enhancement. PARASPINAL AND OTHER SOFT TISSUES: Mild prevertebral fat stranding without focal fluid collection or mass. Low-grade paraspinal muscle strain DISC LEVELS: L1-2: Annular bulging enhancing annular fissure. No canal stenosis or neural foraminal narrowing. L2-3: No disc bulge, canal stenosis nor neural foraminal narrowing. L3-4: Annular bulging, small LEFT subarticular and RIGHT extraforaminal disc protrusions. No canal stenosis. Moderate RIGHT and mild LEFT neural foraminal narrowing. L4-5: Moderate broad-based LEFT subarticular disc protrusion. Small broad-based disc bulge, enhancing annular fissures. Mild canal stenosis,  narrowed LEFT lateral recess which may affect the traversing LEFT L5 nerve. Mild LEFT neural foraminal narrowing. L5-S1: Small central disc protrusion without canal stenosis. Mild neural foraminal narrowing. IMPRESSION: 1. Diffusely abnormal bone marrow signal within the thoracolumbar and included sacral spine with heterogeneous enhancement highly concerning for infiltrative tumor (infiltrative metastatic disease versus myeloproliferative disorder or leukemia/lymphoma). No pathologic fracture. Atypical appearance for infection. 2. Faint epidural enhancement, finding seen with venous congestion, less likely infection. 3. No thoracolumbar canal stenosis. Neural foraminal narrowing L3-4 through L5-S1: Moderate on the RIGHT at L3-4. Electronically Signed   By: Elon Alas M.D.   On: 10/19/2018 05:40   Mr Lumbar Spine W Wo Contrast  Result Date: 10/19/2018 CLINICAL DATA:  Mid and lower back pain, leg weakness. Sepsis. Evaluate for spinal infection. History of endocarditis, diabetes. EXAM: MRI THORACIC AND LUMBAR SPINE WITHOUT AND WITH CONTRAST TECHNIQUE: Multiplanar and multiecho pulse sequences of the thoracic and lumbar spine were obtained without and with intravenous contrast. CONTRAST:  7 cc Gadavist COMPARISON:  CT abdomen and pelvis October 10, 2018 FINDINGS: MRI THORACIC SPINE FINDINGS ALIGNMENT: Maintenance of the thoracic kyphosis. No malalignment. VERTEBRAE/DISCS: Vertebral bodies are intact. Diffusely heterogeneous bone marrow signal with patchy bright STIR signal in superimposed irregular enhancement of nearly all thoracic vertebral bodies. Preservation of the cortex. Intervertebral disc morphologies and signal maintained. No abnormal disc enhancement. CORD: Thoracic spinal cord is normal morphology and signal characteristics. No abnormal cord or leptomeningeal enhancement. Epidural enhancement within the included cervical spine. Minimal ventral epidural enhancement versus failure of fat  saturation. PREVERTEBRAL AND PARASPINAL SOFT TISSUES:  Nonacute. DISC LEVELS: No disc bulge, canal stenosis  nor neural foraminal narrowing. MRI LUMBAR SPINE FINDINGS SEGMENTATION: For the purposes of this report, the last well-formed intervertebral disc is reported as L5-S1. ALIGNMENT: Maintained lumbar lordosis. No malalignment. VERTEBRAE: Vertebral bodies are intact. Diffusely heterogeneous bone marrow signal with patchy bright STIR signal in superimposed irregular enhancement of all lumbar vertebral bodies and included sacrum. Preservation of the cortex. Intervertebral disc morphologies and signal maintained. No abnormal disc enhancement. Congenital canal narrowing on the basis of foreshortened pedicles. CONUS MEDULLARIS AND CAUDA EQUINA: Conus medullaris terminates at L1-2 and demonstrates normal morphology and signal characteristics. Cauda equina is normal. No abnormal cord, leptomeningeal or epidural enhancement. PARASPINAL AND OTHER SOFT TISSUES: Mild prevertebral fat stranding without focal fluid collection or mass. Low-grade paraspinal muscle strain DISC LEVELS: L1-2: Annular bulging enhancing annular fissure. No canal stenosis or neural foraminal narrowing. L2-3: No disc bulge, canal stenosis nor neural foraminal narrowing. L3-4: Annular bulging, small LEFT subarticular and RIGHT extraforaminal disc protrusions. No canal stenosis. Moderate RIGHT and mild LEFT neural foraminal narrowing. L4-5: Moderate broad-based LEFT subarticular disc protrusion. Small broad-based disc bulge, enhancing annular fissures. Mild canal stenosis, narrowed LEFT lateral recess which may affect the traversing LEFT L5 nerve. Mild LEFT neural foraminal narrowing. L5-S1: Small central disc protrusion without canal stenosis. Mild neural foraminal narrowing. IMPRESSION: 1. Diffusely abnormal bone marrow signal within the thoracolumbar and included sacral spine with heterogeneous enhancement highly concerning for infiltrative tumor  (infiltrative metastatic disease versus myeloproliferative disorder or leukemia/lymphoma). No pathologic fracture. Atypical appearance for infection. 2. Faint epidural enhancement, finding seen with venous congestion, less likely infection. 3. No thoracolumbar canal stenosis. Neural foraminal narrowing L3-4 through L5-S1: Moderate on the RIGHT at L3-4. Electronically Signed   By: Elon Alas M.D.   On: 10/19/2018 05:40   Ct Bone Marrow Biopsy & Aspiration  Result Date: 10/23/2018 INDICATION: Diffuse infiltrative marrow process. Please perform CT-guided bone marrow biopsy for tissue diagnostic purposes. EXAM: CT-GUIDED BONE MARROW BIOPSY AND ASPIRATION MEDICATIONS: None ANESTHESIA/SEDATION: Fentanyl 25 mcg IV; Versed 1 mg IV Sedation Time: 11 Minutes; The patient was continuously monitored during the procedure by the interventional radiology nurse under my direct supervision. COMPLICATIONS: None immediate. PROCEDURE: Informed consent was obtained from the patient following an explanation of the procedure, risks, benefits and alternatives. The patient understands, agrees and consents for the procedure. All questions were addressed. A time out was performed prior to the initiation of the procedure. The patient was positioned prone and non-contrast localization CT was performed of the pelvis to demonstrate the iliac marrow spaces. The operative site was prepped and draped in the usual sterile fashion. Under sterile conditions and local anesthesia, a 22 gauge spinal needle was utilized for procedural planning. Next, an 11 gauge coaxial bone biopsy needle was advanced into the right iliac marrow space. Note, the right marrow space was selected for but biopsy given its more pronounced infiltrative appearance. Needle position was confirmed with CT imaging. Initially, bone marrow aspiration was attempted however no marrow was able to be aspirated. Next, a bone marrow biopsy was obtained with the 11 gauge outer  bone marrow device. The 11 gauge coaxial bone biopsy needle was re-advanced into a slightly different location within the left iliac marrow space, appropriate position was again confirmed however again, no marrow was able to be aspirated. An additional bone marrow biopsy was obtained. Samples were prepared with the cytotechnologist and deemed adequate. The needle was removed intact. Hemostasis was obtained with compression and a dressing was placed. The patient tolerated the procedure  well without immediate post procedural complication. IMPRESSION: Successful CT guided left iliac bone marrow core biopsy. No marrow was able to be aspirated from either bone marrow biopsy sites. Electronically Signed   By: Sandi Mariscal M.D.   On: 10/23/2018 10:11   [place] Ekg Site Rite  Result Date: 10/20/2018 If Site Rite image not attached, placement could not be confirmed due to current cardiac rhythm.   ASSESSMENT: Blastic plasmacytoid dendritic cell neoplasm.  PLAN:    1. Blastic plasmacytoid dendritic cell neoplasm: Bone marrow biopsy was completed on October 23, 2018.  Patient was subsequently treated at [facility] with 7 days of subcutaneous Vidaza.  He is currently on 400 mg daily of venetoclax.  Patient has follow-up at [facility] in approximately 1-1/2 weeks which he thinks is for his next treatment, but this is unclear.  Attempts to reach his primary hematologist at [facility] have been unsuccessful up to this point.  Continue supportive care at this time.  Will determine follow-up once case is discussed with his primary hematologist. 2.  Pancytopenia: Secondary to Blastic plasmacytoid dendritic cell neoplasm.  We will proceed with 1 unit of packed red blood cells today.  Patient does not require platelet transfusion.  Will irradiate all blood products at this time. 3.  Neutropenia: Likely multifactorial including infiltrative malignancy as well as treatment with venetoclax and Vidaza.  I spent a total of 60 minutes face-to-face  with the patient of which greater than 50% of the visit was spent in counseling and coordination of care as detailed above.   Patient expressed understanding and was in agreement with this plan. He also understands that He can call clinic at any time with any questions, concerns, or complaints.    Richard Huger, MD   11/16/2018 2:08 PM

## 2018-11-16 NOTE — Unmapped (Signed)
Hi,     Allie with [employer] contacted the [employer] requesting to speak with the care team of Victorino Fatzinger to discuss:    Medications due to a recent lab being high    Please contact at [phone].    Program: Heme Malignancy  Speciality: Medical Oncology    Check Indicates criteria has been reviewed and confirmed with the patient:    []  Preferred Name   []  DOB and/or MR#  []  Preferred Contact Method  []  Phone Number(s)   []  MyChart     Thank you,   Vernie Ammons  [facility]   [phone]

## 2018-11-17 NOTE — Unmapped (Addendum)
Oncology Telephone Triage Note:    Reason for call: vanc trough    Patient information:  I was called by Kyle Huffman's SNF nurse for recommendations for his current vancomycin trough. He is being treated with vancomycin for endocarditis. His vancomycin had been held yesterday for level to 18.  His level is now 14 which is an appropriate usual trough though it is unclear when his last dose was. There was also concern about his rising creatinine and he was supposed to have a creatinine recheck yesterday per notes in our system.  The nurse says that there was no creatinine drawn yesterday.  I recommended to resend a creatinine and to discuss with their pharmacist. Based on the timing of the last dose and decrease in levels, the pharmacist can assist with when to give next dose. She agrees and will call if any further questions.    Nicolasa Ducking, MD  PGY4, Hematology-Oncology fellow  p [phone]

## 2018-11-18 NOTE — Unmapped (Signed)
Hem/Onc Phone Triage Note    Caller: nurse Burnett Harry at [facility]    Reason for Call:   Kyle Huffman is a 67 y.o. with AML currently on aza/venetoclax, as well as HTN, T2DM and CKD stage III and culture negative endocarditis. He is currently on a 4 week course of vancomycin and ceftriaxone. They have been told to call the on call oncology fellow with vancomycin trough levels in order to determine dose adjustments. Numerous fellows have discussed with the facility that this is not an ideal situation, as our expertise is not in antibiotic dosing, but rather in cancer management.    I have reached out to their long term care pharmacy: [phone] and had one of their pharmacists paged to discuss the case. I spoke with April, one of the pharmacists there, who explained to me that the discharge plan was that they were supposed to page the results to KJ (Dr. Bronson Curb nurse navigator) for dose adjustments, given the elevated Cr. When they realized this could be an issue over the weekend, they were instructed to call the on-call fellow for adjustments. Upon review of the past few clinical notes, the level was noted to be drifting down but there was (understandable) discomfort by the fellows on call managing vancomycin, and thus he had not received any further doses since last Thurs.    Below is the objective information I received.    Labs:  CBC: WBC 0.5 Hgb 7.0 Plt 85  BMP: Cr 1.27 (down from 1.43 on 12/10)    Last dose of vancomycin: 1g on 12/12  Today's trough 12/15 was 10.0 at 1:15pm, previous levels were 14 on 12/14 ~4pm and 18 on 12/13 (time unknown)      Assessment/Plan:   Given his improvement in renal function and a random vanc level in the low end of the therapeutic range, April and I agreed that a 1g dose today would be reasonable, with follow up labs including Cr and vanc level tomorrow, with further dose adjustments by their pharmacy +/- input from Dr. Vertell Limber and Doristine Church if they have further concerns. I had used several online calculators to estimate a dose, and she had used their algorithm, both had ended up in a 750-1000mg  dose range. She was comfortable with this plan, and stated that she would communicate with the SNF for this dose and monitoring plan.      Please page Med E Fellow at [phone] if patient needs admission or questions about care occur.     Fellow Taking Call:  Donzetta Sprung  November 18, 2018 11:15 AM

## 2018-11-18 NOTE — Unmapped (Signed)
Received a call from SNF regarding vancomycin trough. See Victorino Dike Morgan's note yesterday.    Apparently, facility pharmacist does not want to manage vancomycin levels/dosing. I am concerned about trying to manage things from afar without assistance from a pharmacist as dosing can be tricky. We are also not seeing the patient on a regular basis.     Repeat vancomycin level yesterday was 14. Last dose of vancomycin was Thursday morning (12/12), so over 48 hours after . Creatinine was is 1.37, similar to 1.43 on discharge. I asked them to repeat both a trough and creatinine today. It's unclear why his random level yesterday is still elevated and thus recommended holding the vancomycin and repeating levels again today.     Teresa Pelton, MD  Fellow, PGY-6, Medicine Hematology & Pediatric Hematology/Oncology  11/18/18 7:52 AM

## 2018-11-19 NOTE — Unmapped (Signed)
Patient is currently at [facility]      11/19/18:  copying internal update from Donzetta Sprung, MD, from yesterday 11/18/18:   As of today (11/18/18) Mr.Glauser will received a 1 gm dose of vanco with a level and Cr tomorrow (12/16.) His renal function is gradually improving.    PLAN:  - Will route to [facility] Pharm D Kaitlyn Buhlinger to request assistance going forward with vanco level monitoring and dose adjustments.   -  KJ [name] [facility] has transitioned to a new position, and she can no longer be responsible for coordinating this.     Lucia Gaskins RN  Per diem nurse navigator

## 2018-11-20 NOTE — Unmapped (Signed)
Telephone encounter date: Monday 11/19/18 at 4pm    I spoke with April Hatcher, PharmD, at [facility], which supplies the abx to the SNF in order to follow-up on recent events at the SNF to assist in monitoring IV vancomycin.     The patient was discharged on various new medications (oral and IV) following recent hospital stay. He was diagnosed with endocarditis. The history I was able to obtain since discharge is the following:    Patient discharged on 12/10 from [facility] with Rx for vancomycin 1 g q24h. On 12/9, his vanc trough was 17.2 mg/L (goal 15-20) and SCr was 1.36.    Friday 12/13 at the SNF his vanc trough was 18.2. This was appropriate, however there was a miscommunication to hold vancomycin.     On Saturday 12/14, vanc trough was 14.1 (SCr was 1.37) and vanc was held inadvertently. On Sunday 12/15, vanc trough was 10 (Scr was 1/29) and vanc was finally administered at 1 g.    When speaking to April on 12/16, the vanc trough was 14.2 after receiving dose of 1 g yesterday.     Recommendation:  - Give the 1 g dose (~4pm today 12/16) and take another trough level tomorrow 12/17 around 4pm to ensure it is not supratherapeutic.   - Provided recommendation to pharmacist that if 12/17 trough level is < 20, to give 1 g, and if > 20 to hold dose.   - I plan on following up with April by phone again on 12/18  - I will also see patient on 12/18 and can help further refine the plan.   - For all intensive purposes, it seems like the patient has remain in a relatively therapeutic range (10-20) even with missed doses. Prefer goal 15-20, but checking levels frequently due to poor and changing renal function.       Manfred Arch, PharmD, BCOP, CPP  Pager: [phone]

## 2018-11-21 ENCOUNTER — Other Ambulatory Visit: Admit: 2018-11-21 | Discharge: 2018-11-21 | Payer: MEDICARE

## 2018-11-21 ENCOUNTER — Ambulatory Visit: Admit: 2018-11-21 | Discharge: 2018-11-21 | Payer: MEDICARE

## 2018-11-21 ENCOUNTER — Ambulatory Visit: Admit: 2018-11-21 | Discharge: 2018-11-21 | Payer: MEDICARE | Attending: Pharmacist | Primary: Pharmacist

## 2018-11-21 DIAGNOSIS — Z79899 Other long term (current) drug therapy: Secondary | ICD-10-CM | POA: Diagnosis not present

## 2018-11-21 DIAGNOSIS — C9200 Acute myeloblastic leukemia, not having achieved remission: Secondary | ICD-10-CM | POA: Diagnosis not present

## 2018-11-21 DIAGNOSIS — I358 Other nonrheumatic aortic valve disorders: Secondary | ICD-10-CM

## 2018-11-21 LAB — CBC W/ AUTO DIFF
BASOPHILS ABSOLUTE COUNT: 0 10*9/L (ref 0.0–0.1)
BASOPHILS RELATIVE PERCENT: 0.4 %
EOSINOPHILS ABSOLUTE COUNT: 0 10*9/L (ref 0.0–0.4)
EOSINOPHILS RELATIVE PERCENT: 0.2 %
HEMATOCRIT: 26.9 % — ABNORMAL LOW (ref 41.0–53.0)
HEMOGLOBIN: 9 g/dL — ABNORMAL LOW (ref 13.5–17.5)
LARGE UNSTAINED CELLS: 8 % — ABNORMAL HIGH (ref 0–4)
LYMPHOCYTES ABSOLUTE COUNT: 0.5 10*9/L — ABNORMAL LOW (ref 1.5–5.0)
LYMPHOCYTES RELATIVE PERCENT: 84.2 %
MEAN CORPUSCULAR HEMOGLOBIN: 29.2 pg (ref 26.0–34.0)
MEAN CORPUSCULAR VOLUME: 87.3 fL (ref 80.0–100.0)
MEAN PLATELET VOLUME: 9 fL (ref 7.0–10.0)
MONOCYTES ABSOLUTE COUNT: 0 10*9/L — ABNORMAL LOW (ref 0.2–0.8)
MONOCYTES RELATIVE PERCENT: 1.2 %
NEUTROPHILS ABSOLUTE COUNT: 0 10*9/L — CL (ref 2.0–7.5)
PLATELET COUNT: 274 10*9/L (ref 150–440)
RED BLOOD CELL COUNT: 3.08 10*12/L — ABNORMAL LOW (ref 4.50–5.90)
RED CELL DISTRIBUTION WIDTH: 16.7 % — ABNORMAL HIGH (ref 12.0–15.0)
WBC ADJUSTED: 0.6 10*9/L — ABNORMAL LOW (ref 4.5–11.0)

## 2018-11-21 LAB — COMPREHENSIVE METABOLIC PANEL
ALBUMIN: 3.1 g/dL — ABNORMAL LOW (ref 3.5–5.0)
ALKALINE PHOSPHATASE: 521 U/L — ABNORMAL HIGH (ref 38–126)
ALT (SGPT): 15 U/L (ref <50)
AST (SGOT): 15 U/L — ABNORMAL LOW (ref 19–55)
BILIRUBIN TOTAL: 0.4 mg/dL (ref 0.0–1.2)
BLOOD UREA NITROGEN: 9 mg/dL (ref 7–21)
BUN / CREAT RATIO: 7
CALCIUM: 8.3 mg/dL — ABNORMAL LOW (ref 8.5–10.2)
CO2: 22 mmol/L (ref 22.0–30.0)
CREATININE: 1.25 mg/dL (ref 0.70–1.30)
EGFR CKD-EPI AA MALE: 68 mL/min/{1.73_m2} (ref >=60)
EGFR CKD-EPI NON-AA MALE: 59 mL/min/{1.73_m2} — ABNORMAL LOW (ref >=60)
GLUCOSE RANDOM: 227 mg/dL — ABNORMAL HIGH (ref 70–179)
POTASSIUM: 4.7 mmol/L (ref 3.5–5.0)
PROTEIN TOTAL: 5.7 g/dL — ABNORMAL LOW (ref 6.5–8.3)
SODIUM: 137 mmol/L (ref 135–145)

## 2018-11-21 LAB — ANISOCYTOSIS

## 2018-11-21 LAB — VANCOMYCIN RANDOM: Vancomycin^random:MCnc:Pt:Ser/Plas:Qn:: 5.1

## 2018-11-21 LAB — EGFR CKD-EPI AA MALE: Lab: 68

## 2018-11-21 LAB — SMEAR REVIEW

## 2018-11-21 NOTE — Unmapped (Signed)
Labs collected and sent by TC.

## 2018-11-21 NOTE — Unmapped (Signed)
Kyle Huffman is a 67 y.o. male with AML who I am seeing in clinic today for post-hospital discharge transitions of care visit    Encounter Date: 11/21/2018    Current Treatment: venetoclax/azacitidine    For oral chemotherapy:  Pharmacy: [facility]   Medication Access: $0/month with grant     Interim History: Mr. Gotay has minimal complaints today. He is residing in a SNF since discharge. He denies fevers, constipation/diarrhea, bleeding. He does note some dizziness when he's doing something active and he states that he does not drink much water. He notes that he's not very hungry. He does not think he is getting his venetoclax with food. He does not believe he knows his medication list, but I did not note concerns. He is on IV vancomycin and IV ceftriaxone for endocarditis, as well as IV micafungin for ppx with oral valtrex. I have been working with SNF pharmd regarding vancomycin levels.     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         Weight and Vitals:  Wt Readings from Last 3 Encounters:   11/21/18 60.9 kg (134 lb 3.2 oz)   11/21/18 60.9 kg (134 lb 2.4 oz)   11/12/18 59.6 kg (131 lb 6.3 oz)     Temp Readings from Last 3 Encounters:   11/21/18 36.8 ??C (98.2 ??F) (Oral)   11/21/18 36.8 ??C (98.2 ??F) (Oral)   11/13/18 37.5 ??C (99.5 ??F) (Oral)     BP Readings from Last 3 Encounters: 11/21/18 110/63   11/21/18 104/59   11/13/18 128/61     Pulse Readings from Last 3 Encounters:   11/21/18 113   11/21/18 130   11/13/18 114       Pertinent Labs:  Lab on 11/21/2018   Component Date Value Ref Range Status   ??? Sodium 11/21/2018 137  135 - 145 mmol/L Final   ??? Potassium 11/21/2018 4.7  3.5 - 5.0 mmol/L Final   ??? Chloride 11/21/2018 102  98 - 107 mmol/L Final   ??? CO2 11/21/2018 22.0  22.0 - 30.0 mmol/L Final   ??? BUN 11/21/2018 9  7 - 21 mg/dL Final   ??? Creatinine 11/21/2018 1.25  0.70 - 1.30 mg/dL Final   ??? BUN/Creatinine Ratio 11/21/2018 7   Final   ??? EGFR CKD-EPI Non-African American,* 11/21/2018 59* >=60 mL/min/1.104m2 Final   ??? EGFR CKD-EPI African American, Male 11/21/2018 68  >=60 mL/min/1.34m2 Final   ??? Glucose 11/21/2018 227* 70 - 179 mg/dL Final   ??? Calcium 56/21/3086 8.3* 8.5 - 10.2 mg/dL Final   ??? Albumin 57/84/6962 3.1* 3.5 - 5.0 g/dL Final   ??? Total Protein 11/21/2018 5.7* 6.5 - 8.3 g/dL Final   ??? Total Bilirubin 11/21/2018 0.4  0.0 - 1.2 mg/dL Final   ???  AST 11/21/2018 15* 19 - 55 U/L Final   ??? ALT 11/21/2018 15  <50 U/L Final   ??? Alkaline Phosphatase 11/21/2018 521* 38 - 126 U/L Final   ??? Anion Gap 11/21/2018 13  7 - 15 mmol/L Final   ??? Vancomycin Rm 11/21/2018 5.1  Undefined ug/mL Final   ??? WBC 11/21/2018 0.6* 4.5 - 11.0 10*9/L Final   ??? RBC 11/21/2018 3.08* 4.50 - 5.90 10*12/L Final   ??? HGB 11/21/2018 9.0* 13.5 - 17.5 g/dL Final   ??? HCT 16/09/9603 26.9* 41.0 - 53.0 % Final   ??? MCV 11/21/2018 87.3  80.0 - 100.0 fL Final   ??? MCH 11/21/2018 29.2  26.0 - 34.0 pg Final   ??? MCHC 11/21/2018 33.5  31.0 - 37.0 g/dL Final   ??? RDW 54/08/8118 16.7* 12.0 - 15.0 % Final   ??? MPV 11/21/2018 9.0  7.0 - 10.0 fL Final   ??? Platelet 11/21/2018 274  150 - 440 10*9/L Final   ??? Variable HGB Concentration 11/21/2018 Slight* Not Present Final   ??? Neutrophils % 11/21/2018 5.6  % Final   ??? Lymphocytes % 11/21/2018 84.2  % Final   ??? Monocytes % 11/21/2018 1.2  % Final   ??? Eosinophils % 11/21/2018 0.2  % Final   ??? Basophils % 11/21/2018 0.4  % Final   ??? Absolute Neutrophils 11/21/2018 0.0* 2.0 - 7.5 10*9/L Final   ??? Absolute Lymphocytes 11/21/2018 0.5* 1.5 - 5.0 10*9/L Final   ??? Absolute Monocytes 11/21/2018 0.0* 0.2 - 0.8 10*9/L Final   ??? Absolute Eosinophils 11/21/2018 0.0  0.0 - 0.4 10*9/L Final   ??? Absolute Basophils 11/21/2018 0.0  0.0 - 0.1 10*9/L Final   ??? Large Unstained Cells 11/21/2018 8* 0 - 4 % Final   ??? Anisocytosis 11/21/2018 Slight* Not Present Final   ??? Hypochromasia 11/21/2018 Slight* Not Present Final   ??? Smear Review Comments 11/21/2018 See Comment* Undefined Final    Reactive lymphocytes present.         Allergies: No Known Allergies    Drug Interactions: none - may be indicated for posaconazole and subsequent venetoclax dose adjustment would be required      Current Medications:  Current Outpatient Medications   Medication Sig Dispense Refill   ??? amLODIPine (NORVASC) 10 MG tablet Take 1 tablet (10 mg total) by mouth daily. 90 tablet 3   ??? cefTRIAXone 2 g in sodium chloride 0.9 % 0.9 % 100 mL IVPB Infuse 2 g into a venous catheter daily. for 14 days 1 each 0   ??? cefTRIAXone, premix, (ROCEPHIN) 2 gram/50 mL IVPB Infuse into a venous catheter.     ??? cyclobenzaprine (FLEXERIL) 5 MG tablet TAKE 1 TABLET BY MOUTH THREE TIMES A DAY AS NEEDED FOR MUSCLE SPASMS  0   ??? insulin glargine (LANTUS) 100 unit/mL injection Inject 0.15 mL (15 Units total) under the skin nightly. 4.5 mL 0   ??? insulin lispro (HUMALOG) 100 unit/mL injection Inject 0-0.12 mL (0-12 Units total) under the skin Four (4) times a day (before meals and nightly). See discharge summary for titration. 20 mL 0   ??? micafungin (MYCAMINE) 50 mg injection Infuse into a venous catheter.     ??? micafungin 50 mg, OVERFILL 10 mL in sodium chloride 0.9 % 100 mL IVPB Infuse 50 mg into a venous catheter daily. 1 each 0   ??? mirtazapine (REMERON) 7.5 MG tablet Take 1 tablet (7.5 mg total) by mouth nightly. 90 tablet 3   ???  OLANZapine zydis (ZYPREXA) 5 MG disintegrating tablet Take 0.5 tablets (2.5 mg total) by mouth nightly. 15 tablet 0   ??? ondansetron (ZOFRAN-ODT) 4 MG disintegrating tablet Take 1 tablet (4 mg total) by mouth every eight (8) hours as needed for nausea. 30 tablet 0   ??? ONETOUCH VERIO Strp      ??? pravastatin (PRAVACHOL) 10 MG tablet Take 1 tablet (10 mg total) by mouth nightly. 30 tablet 0   ??? valACYclovir (VALTREX) 500 MG tablet Take 1 tablet (500 mg total) by mouth daily.  0   ??? vancomycin (VANCOCIN) 1 gram/200 mL IVPB Infuse 200 mL (1,000 mg total) into a venous catheter daily. for 14 days 3800 mL 0   ??? venetoclax (VENCLEXTA) 100 mg tablet Take 4 tablets (400 mg total) by mouth daily with a meal and water. Do not chew, crush, or break tablets. 120 tablet 2     No current facility-administered medications for this visit.          Assessment: Mr.Munyon is a 67 y.o. male with AML being treated currently with venetoclax/azacitidine    Plan:   - Will continue with venetoclax 400 mg daily. It does not sound like patient is getting any medications with food. I have asked the pharmacist there to note that in the admin instructions, and patient should ask to take with food  - Patient will get lantus 15 units nightly along with SSI  - Patient will follow-up with [facility], and their recommendations related to IV antimicrobials is appreciated. Also, if posaconazole is required, we will need to start access investigation. Venetoclax dose would have to be adjusted    Vanc monitoring:  - Please see phone note from 12/16 regarding vancomycin monitoring. Since then, his trough level was 7 on 12/17, but a dose was not administered. This morning in clinic the trough was 5.1. We gave a 1 g dose of vancomycin here prior to sending him home  - I spoke with pharmd at SNF and the plan will be to continue vancomycin 1 g every 24 hours but to give next dose tomorrow AM at 8am to allow for trough follow-up during the day. He should get a BMP and trough level on Friday at 8am and when this returns I will communicate with pharmd there what the plan will be for the weekend.    F/u:  Future Appointments   Date Time Provider Department Center   11/26/2018  1:00 PM ADULT ONC LAB [facility]   11/26/2018  2:00 PM Gita Kudo Tyler, PA [id number]   11/27/2018  9:30 AM Park Breed, MD [facility]   11/29/2018  8:15 AM ADULT ONC [facility]   11/29/2018  9:00 AM Avie Arenas, MD [facility]       I spent 30 minutes with Mr.Amey in direct patient care.      Manfred Arch, PharmD, CPP  Pager: [phone]

## 2018-11-21 NOTE — Unmapped (Signed)
[facility] Leukemia Clinic: Transitions of care visit    Patient Name: Kyle Huffman  Patient Age: 67 y.o.  Encounter Date: 11/21/2018    Primary Care Provider:  Referring Unknown, Per Patie    Referring Physician:  External Ordering Provi*    Reason for visit:  Transitions of care visit    Assessment:  Mr.  Kyle Huffman is a  67 y.o. male with a PMHx of HTN, T2DM, CKD-III, who presented to [facility] as a transfer from OSH for CN aortic valve endocarditis and newly diagnosed monocytic AML (trisomy 8, jumping 3q; ASXL1, CBL, NPM1, SRSF2, TET2). He underwent induction with azacitidine + venetoclax (D1=10/31/18). He is seen in clinic following inpatient discharge for coordination of transition of care.      Mr. Kyle Huffman continues to undergo PT at the SNF. He remains on IV vancomycin and ceftriaxone for aortic valve endocarditis. Today is Cycle 1, Day 22 azacitidine + venetoclax. He has grade 4 neutropenia, so I reviewed neutropenic precautions. He will need to start levaquin prophylaxis when he finishes his course of IV antibiotics. He is also on valtrex and IV micafungin.    Plan and Recommendations:  1. AML:  - Continue venetoclax 400 mg daily. Please administer with food.  - Bone marrow biopsy w/ MRD on 11/26/18  - RTC on 11/29/18 for new patient appointment with Dr. Vertell Limber    2. Prophylaxis:  - Educated patient on neutropenic precautions  - Once he has completed IV antibiotics, should start levaquin 500 mg by mouth daily.  - Continue IV micafungin. If grade 4 neutropenia persists, may need to switch to PO posaconazole w/ concurrent reduction in venetoclax dosing.  - Continue PO valtrex.    3. Culture-negative aortic valve endocarditis:  - Continue IV ceftriaxone to complete 14 day course.  - Continue IV vancomyin 1 g every 24 hours to complete 14 day course. Will administer 1,000 mg IV vancomycin dose in infusion center today. Next dose of vancomycin should be given on 11/22/18 at 8am. Continue 1 g every 24 hours. Please draw vancomycin trough on 11/23/18 prior to that day's dosing. April (pharmd) should communicate with Psychologist, counselling (pharmd at [facility]) regarding trough level.     4. T2DM:  - Continue lantus 15 units nightly. Note: home dose prior to admission 42 units nightly. May need to uptitrate as PO intake improves.  - Continue sliding scale insulin as follows:    Sliding scale orders:  Blood Glucose   51-70 mg/dL                Give juice/crackers  71-150 mg/dL              0 units  161-096 mg/dL            2 units  045-409 mg/dL            4 units  811-914 mg/dL            6 units  782-956 mg/dL            8 units  213-086 mg/dL            10 units  > 400 mg/dL                12 units    - Given ongoing IV vancomycin and possibility of worsening creatinine along with ongoing poor PO intake, will not restart metformin at this time. Consider at future visits.    Dr. Vertell Limber was  available.    Mariana Kaufman, AGPCNP-BC  Nurse Practitioner  Division of Hematology/Oncology  [facility]  11/21/2018      History of Present Illness:  We had the pleasure of seeing Kyle Huffman in the Leukemia Clinic at the [place] [facility] [place] on 11/21/2018.  He is a 67 y.o. male with a PMHx of HTN, T2DM, CKD-III, who presented to [facility] as a transfer from OSH for CN aortic valve endocarditis and newly diagnosed monocytic AML (trisomy 8, jumping 3q; ASXL1, CBL, NPM1, SRSF2, TET2). He underwent induction with azacitidine + venetoclax (D1=10/31/18).    Current therapy is Cycle 1, Day 22 azacitidine + venetoclax.    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       INTERVAL HISTORY:  Since discharge, Mr. Kyle Huffman says that he is doing a little better. He thinks he is getting stronger with PT. He is able to walk with a walker around the SNF. His brother helps him with dressing and bathing so that he doesn't fall. He finds that if he eats very small amounts, he won't vomit. He doesn't like Ensure. He will drink V8 juice. He also likes sweet things. He thinks his weight is a little better.     Denies fevers. Denies unexplained bleeding. Denies headache. Denies CP. Denies SOB. He has an occasional dry cough. Denies d/c. Denies LE edema.    Otherwise, he??denies new constitutional symptoms such as anorexia, weight loss, fatigue, night sweats or unexplained fevers. ??Furthermore, he??denies unexplained bleeding or bruising, recurrent or unexplained intercurrent infections, dyspnea on exertion, lightheadedness, palpitations or chest pain. ??There have been no new or unexplained pains or self-identified masses, swelling or enlarged lymph nodes.    PAST MEDICAL HISTORY:  Past Medical History:   Diagnosis Date   ??? Diabetes mellitus (CMS-[name])    ??? Hyperlipidemia    ??? Hypertension    ??? Stroke (CMS-[name])        MEDICATIONS:  Current Outpatient Medications   Medication Sig Dispense Refill   ??? amLODIPine (NORVASC) 10 MG tablet Take 1 tablet (10 mg total) by mouth daily. 90 tablet 3   ??? cefTRIAXone 2 g in sodium chloride 0.9 % 0.9 % 100 mL IVPB Infuse 2 g into a venous catheter daily. for 14 days 1 each 0   ??? cefTRIAXone, premix, (ROCEPHIN) 2 gram/50 mL IVPB Infuse into a venous catheter.     ??? insulin glargine (LANTUS) 100 unit/mL injection Inject 0.15 mL (15 Units total) under the skin nightly. 4.5 mL 0   ??? insulin lispro (HUMALOG) 100 unit/mL injection Inject 0-0.12 mL (0-12 Units total) under the skin Four (4) times a day (before meals and nightly). See discharge summary for titration. 20 mL 0   ??? micafungin (MYCAMINE) 50 mg injection Infuse  into a venous catheter.     ??? micafungin 50 mg, OVERFILL 10 mL in sodium chloride 0.9 % 100 mL IVPB Infuse 50 mg into a venous catheter daily. 1 each 0   ??? mirtazapine (REMERON) 7.5 MG tablet Take 1 tablet (7.5 mg total) by mouth nightly. 90 tablet 3   ??? OLANZapine zydis (ZYPREXA) 5 MG disintegrating tablet Take 0.5 tablets (2.5 mg total) by mouth nightly. 15 tablet 0   ??? ondansetron (ZOFRAN-ODT) 4 MG disintegrating tablet Take 1 tablet (4 mg total) by mouth every eight (8) hours as needed for nausea. 30 tablet 0   ??? ONETOUCH VERIO Strp      ??? pravastatin (PRAVACHOL) 10 MG tablet Take 1 tablet (10 mg total) by mouth nightly. 30 tablet 0   ??? valACYclovir (VALTREX) 500 MG tablet Take 1 tablet (500 mg total) by mouth daily.  0   ??? vancomycin (VANCOCIN) 1 gram/200 mL IVPB Infuse 200 mL (1,000 mg total) into a venous catheter daily. for 14 days 3800 mL 0   ??? venetoclax (VENCLEXTA) 100 mg tablet Take 4 tablets (400 mg total) by mouth daily with a meal and water. Do not chew, crush, or break tablets. 120 tablet 2   ??? cyclobenzaprine (FLEXERIL) 5 MG tablet TAKE 1 TABLET BY MOUTH THREE TIMES A DAY AS NEEDED FOR MUSCLE SPASMS  0     No current facility-administered medications for this visit.      ALLERGIES:  No Known Allergies    SOCIAL HISTORY:  Social History     Patient does not qualify to have social determinant information on file (likely too young).   Social History Narrative   ??? Not on file        FAMILY HISTORY:  Family History   Problem Relation Age of Onset   ??? Heart disease Father    ??? Hypertension Father    ??? Diabetes Father    ??? Brain cancer Mother    ??? Heart disease Sister      REVIEW OF SYSTEMS:  Other than as described in the interval history, the other systems reviewed were unremarkable. ECOG PS=2-3    PHYSICAL EXAMINATION:  VS:   BP 110/63  - Pulse 113  - Temp 36.8 ??C (98.2 ??F) (Oral)  - Resp 18  - Ht 177.8 cm (5' 10)  - Wt 60.9 kg (134 lb 3.2 oz)  - SpO2 100%  - BMI 19.26 kg/m??     Constitutional: Resting, in no apparent distress  Eyes: PERRL. No scleral icterus or conjunctival injection.  Ear/nose/mouth/throat: Oral mucosa without ulceration, erythema or exudate.   Hematology/lymphatic/immunologic:  No lymphadenopathy in the anterior/posterior cervical, supraclavicular basins.  Cardiovascular:  RRR.  S1, S2.  No murmurs, gallops or rubs. Appear well-perfused.  No clubbing, edema or cyanosis.  Respiratory:  Breathing is unlabored, and patient is speaking full sentences with ease.  No stridor.  CTAB. No rales, ronchi or crackles.    GI:  No distention or pain on palpation.  Bowel sounds are present and normal in quality.  No palpable hepatomegaly or splenomegaly.  No palpable masses.  GU: not examined  Musculoskeletal:  No grossly-evident joint effusions or deformities.  Range of motion about the shoulder, elbow, hips and knees is grossly normal.    Skin:  No rashes, petechiae or purpura.  No areas of skin breakdown. Warm to touch, dry, smooth and even.  Neurologic:   Gait is normal.  Cerebellar tasks are completed with ease and  are symmetric.  Psychiatric:  Alert and oriented to person, place, time and situation.  Range of affect is appropriate.    LABORATORY DATA:  Lab on 11/21/2018   Component Date Value Ref Range Status   ??? Sodium 11/21/2018 137  135 - 145 mmol/L Final   ??? Potassium 11/21/2018 4.7  3.5 - 5.0 mmol/L Final   ??? Chloride 11/21/2018 102  98 - 107 mmol/L Final   ??? CO2 11/21/2018 22.0  22.0 - 30.0 mmol/L Final   ??? BUN 11/21/2018 9  7 - 21 mg/dL Final   ??? Creatinine 11/21/2018 1.25  0.70 - 1.30 mg/dL Final   ??? BUN/Creatinine Ratio 11/21/2018 7   Final   ??? EGFR CKD-EPI Non-African American,* 11/21/2018 59* >=60 mL/min/1.97m2 Final   ??? EGFR CKD-EPI African American, Male 11/21/2018 68  >=60 mL/min/1.47m2 Final   ??? Glucose 11/21/2018 227* 70 - 179 mg/dL Final   ??? Calcium 16/09/9603 8.3* 8.5 - 10.2 mg/dL Final   ??? Albumin 54/08/8118 3.1* 3.5 - 5.0 g/dL Final   ??? Total Protein 11/21/2018 5.7* 6.5 - 8.3 g/dL Final   ??? Total Bilirubin 11/21/2018 0.4  0.0 - 1.2 mg/dL Final   ??? AST 14/78/2956 15* 19 - 55 U/L Final   ??? ALT 11/21/2018 15  <50 U/L Final   ??? Alkaline Phosphatase 11/21/2018 521* 38 - 126 U/L Final   ??? Anion Gap 11/21/2018 13  7 - 15 mmol/L Final   ??? Vancomycin Rm 11/21/2018 5.1  Undefined ug/mL Final   ??? WBC 11/21/2018 0.6* 4.5 - 11.0 10*9/L Final   ??? RBC 11/21/2018 3.08* 4.50 - 5.90 10*12/L Final   ??? HGB 11/21/2018 9.0* 13.5 - 17.5 g/dL Final   ??? HCT 21/30/8657 26.9* 41.0 - 53.0 % Final   ??? MCV 11/21/2018 87.3  80.0 - 100.0 fL Final   ??? MCH 11/21/2018 29.2  26.0 - 34.0 pg Final   ??? MCHC 11/21/2018 33.5  31.0 - 37.0 g/dL Final   ??? RDW 84/69/6295 16.7* 12.0 - 15.0 % Final   ??? MPV 11/21/2018 9.0  7.0 - 10.0 fL Final   ??? Platelet 11/21/2018 274  150 - 440 10*9/L Final   ??? Variable HGB Concentration 11/21/2018 Slight* Not Present Final   ??? Neutrophils % 11/21/2018 5.6  % Final   ??? Lymphocytes % 11/21/2018 84.2  % Final   ??? Monocytes % 11/21/2018 1.2  % Final   ??? Eosinophils % 11/21/2018 0.2  % Final   ??? Basophils % 11/21/2018 0.4  % Final   ??? Absolute Neutrophils 11/21/2018 0.0* 2.0 - 7.5 10*9/L Final   ??? Absolute Lymphocytes 11/21/2018 0.5* 1.5 - 5.0 10*9/L Final   ??? Absolute Monocytes 11/21/2018 0.0* 0.2 - 0.8 10*9/L Final   ??? Absolute Eosinophils 11/21/2018 0.0  0.0 - 0.4 10*9/L Final   ??? Absolute Basophils 11/21/2018 0.0  0.0 - 0.1 10*9/L Final   ??? Large Unstained Cells 11/21/2018 8* 0 - 4 % Final   ??? Anisocytosis 11/21/2018 Slight* Not Present Final   ??? Hypochromasia 11/21/2018 Slight* Not Present Final   ??? Smear Review Comments 11/21/2018 See Comment* Undefined Final    Reactive lymphocytes present.

## 2018-11-21 NOTE — Unmapped (Addendum)
Please ask your facility to administer your venetoclax WITH food. The dose is 400 mg (4 tablets) once daily.    You are at high risk for infection.    Monitor your temperature once a day or if you feel poorly. If you develop a fever of 100.5 or higher, go to the ER.    Bone marrow biopsy on 12/23 and back to see Dr. Vertell Limber on 12/26.    Continue IV micafungin.    Continue IV ceftriaxone.    Continue IV vancomyin 1 g every 24 hours. Your next dose of this should be on 11/22/18 at 8am. Continue 1 g every 24 hours. I recommend a trough level on Friday 11/23/18 prior to that dose. Please give dose (do NOT hold) while waiting for that level to return. April (pharmd) should communicate with Psychologist, counselling (pharmd at [facility]) regarding trough level.     Continue lantus 15 units nightly.    Continue sliding scale insulin as follows:    Sliding scale orders:  Blood Glucose   51-70 mg/dL                Give juice/crackers  71-150 mg/dL              0 units  161-096 mg/dL            2 units  045-409 mg/dL            4 units  811-914 mg/dL            6 units  782-956 mg/dL            8 units  213-086 mg/dL            10 units  > 400 mg/dL                12 units      -----------------------------------  If you have questions or concerns at night or on the weekend, call the hospital operator at [phone] and ask to speak to the hematology/oncology fellow on call.    If you have questions or concerns on a week day during business hours, you may call the Nurse call line at [phone].  -------------------------------------------------------    Lab on 11/21/2018   Component Date Value Ref Range Status   ??? Sodium 11/21/2018 137  135 - 145 mmol/L Final   ??? Potassium 11/21/2018 4.7  3.5 - 5.0 mmol/L Final   ??? Chloride 11/21/2018 102  98 - 107 mmol/L Final   ??? CO2 11/21/2018 22.0  22.0 - 30.0 mmol/L Final   ??? BUN 11/21/2018 9  7 - 21 mg/dL Final   ??? Creatinine 11/21/2018 1.25  0.70 - 1.30 mg/dL Final   ??? BUN/Creatinine Ratio 11/21/2018 7   Final   ??? EGFR CKD-EPI Non-African American,* 11/21/2018 59* >=60 mL/min/1.47m2 Final   ??? EGFR CKD-EPI African American, Male 11/21/2018 68  >=60 mL/min/1.54m2 Final   ??? Glucose 11/21/2018 227* 70 - 179 mg/dL Final   ??? Calcium 02/72/5366 8.3* 8.5 - 10.2 mg/dL Final   ??? Albumin 44/02/4741 3.1* 3.5 - 5.0 g/dL Final   ??? Total Protein 11/21/2018 5.7* 6.5 - 8.3 g/dL Final   ??? Total Bilirubin 11/21/2018 0.4  0.0 - 1.2 mg/dL Final   ??? AST 59/56/3875 15* 19 - 55 U/L Final   ??? ALT 11/21/2018 15  <50 U/L Final   ??? Alkaline Phosphatase 11/21/2018 521* 38 - 126 U/L Final   ??? Anion Gap 11/21/2018 13  7 - 15 mmol/L Final   ??? Vancomycin Rm 11/21/2018 5.1  Undefined ug/mL Final   ??? WBC 11/21/2018 0.6* 4.5 - 11.0 10*9/L Final   ??? RBC 11/21/2018 3.08* 4.50 - 5.90 10*12/L Final   ??? HGB 11/21/2018 9.0* 13.5 - 17.5 g/dL Final   ??? HCT 60/45/4098 26.9* 41.0 - 53.0 % Final   ??? MCV 11/21/2018 87.3  80.0 - 100.0 fL Final   ??? MCH 11/21/2018 29.2  26.0 - 34.0 pg Final   ??? MCHC 11/21/2018 33.5  31.0 - 37.0 g/dL Final   ??? RDW 11/91/4782 16.7* 12.0 - 15.0 % Final   ??? MPV 11/21/2018 9.0  7.0 - 10.0 fL Final   ??? Platelet 11/21/2018 274  150 - 440 10*9/L Final   ??? Variable HGB Concentration 11/21/2018 Slight* Not Present Final   ??? Neutrophils % 11/21/2018 5.6  % Final   ??? Lymphocytes % 11/21/2018 84.2  % Final   ??? Monocytes % 11/21/2018 1.2  % Final   ??? Eosinophils % 11/21/2018 0.2  % Final   ??? Basophils % 11/21/2018 0.4  % Final   ??? Absolute Neutrophils 11/21/2018 0.0* 2.0 - 7.5 10*9/L Final   ??? Absolute Lymphocytes 11/21/2018 0.5* 1.5 - 5.0 10*9/L Final   ??? Absolute Monocytes 11/21/2018 0.0* 0.2 - 0.8 10*9/L Final   ??? Absolute Eosinophils 11/21/2018 0.0  0.0 - 0.4 10*9/L Final   ??? Absolute Basophils 11/21/2018 0.0  0.0 - 0.1 10*9/L Final   ??? Large Unstained Cells 11/21/2018 8* 0 - 4 % Final   ??? Anisocytosis 11/21/2018 Slight* Not Present Final   ??? Hypochromasia 11/21/2018 Slight* Not Present Final   ??? Smear Review Comments 11/21/2018 See Comment* Undefined Final    Reactive lymphocytes present.

## 2018-11-24 NOTE — Unmapped (Signed)
I spoke with the PharmD who works for the pharmacy that provides his SNF IV medications. I was following up on today's vanc level. We are targeting a goal of 15-20. He was recently therapeutic on 1 g q24 h. There were several mixed doses over the last week resulting in a level as low as 5 when we saw him in clinic. Since then (12/28), he has been on 1 g q24h. Today, which would be prior to his 3rd consistent dose, his vancomycin trough was 13. My recommendation was to continue 1 g daily as he is likely not at steady state and likely will be after today's dose, which was given. He will get another trough on Monday, and I will follow-up regarding this dose. He is due to end therapy 12/24, when he also sees ICID. At this point, it will be determined if more therapy is needed.      Manfred Arch, PharmD, BCOP, CPP  Pager: [phone]

## 2018-11-26 ENCOUNTER — Other Ambulatory Visit: Admit: 2018-11-26 | Discharge: 2018-11-27 | Payer: MEDICARE

## 2018-11-26 ENCOUNTER — Ambulatory Visit: Admit: 2018-11-26 | Discharge: 2018-11-27 | Payer: MEDICARE

## 2018-11-26 DIAGNOSIS — Z794 Long term (current) use of insulin: Secondary | ICD-10-CM | POA: Diagnosis not present

## 2018-11-26 DIAGNOSIS — E1142 Type 2 diabetes mellitus with diabetic polyneuropathy: Secondary | ICD-10-CM | POA: Diagnosis not present

## 2018-11-26 DIAGNOSIS — I358 Other nonrheumatic aortic valve disorders: Secondary | ICD-10-CM | POA: Diagnosis not present

## 2018-11-26 DIAGNOSIS — N183 Chronic kidney disease, stage 3 (moderate): Secondary | ICD-10-CM | POA: Diagnosis not present

## 2018-11-26 DIAGNOSIS — E1122 Type 2 diabetes mellitus with diabetic chronic kidney disease: Secondary | ICD-10-CM | POA: Diagnosis not present

## 2018-11-26 DIAGNOSIS — I129 Hypertensive chronic kidney disease with stage 1 through stage 4 chronic kidney disease, or unspecified chronic kidney disease: Secondary | ICD-10-CM | POA: Diagnosis not present

## 2018-11-26 DIAGNOSIS — R531 Weakness: Secondary | ICD-10-CM | POA: Diagnosis not present

## 2018-11-26 DIAGNOSIS — C9200 Acute myeloblastic leukemia, not having achieved remission: Secondary | ICD-10-CM | POA: Diagnosis not present

## 2018-11-26 LAB — COMPREHENSIVE METABOLIC PANEL
ALBUMIN: 2.9 g/dL — ABNORMAL LOW (ref 3.5–5.0)
ALKALINE PHOSPHATASE: 486 U/L — ABNORMAL HIGH (ref 38–126)
ALT (SGPT): 11 U/L (ref <50)
AST (SGOT): 13 U/L — ABNORMAL LOW (ref 19–55)
BLOOD UREA NITROGEN: 12 mg/dL (ref 7–21)
BUN / CREAT RATIO: 10
CALCIUM: 7.9 mg/dL — ABNORMAL LOW (ref 8.5–10.2)
CHLORIDE: 104 mmol/L (ref 98–107)
CO2: 22 mmol/L (ref 22.0–30.0)
CREATININE: 1.26 mg/dL (ref 0.70–1.30)
EGFR CKD-EPI AA MALE: 67 mL/min/{1.73_m2} (ref >=60)
GLUCOSE RANDOM: 202 mg/dL — ABNORMAL HIGH (ref 70–179)
POTASSIUM: 4 mmol/L (ref 3.5–5.0)
PROTEIN TOTAL: 5.5 g/dL — ABNORMAL LOW (ref 6.5–8.3)
SODIUM: 136 mmol/L (ref 135–145)

## 2018-11-26 LAB — CBC W/ AUTO DIFF
BASOPHILS ABSOLUTE COUNT: 0 10*9/L (ref 0.0–0.1)
BASOPHILS RELATIVE PERCENT: 1.2 %
EOSINOPHILS ABSOLUTE COUNT: 0 10*9/L (ref 0.0–0.4)
EOSINOPHILS RELATIVE PERCENT: 0.6 %
HEMOGLOBIN: 7.8 g/dL — ABNORMAL LOW (ref 13.5–17.5)
LARGE UNSTAINED CELLS: 5 % — ABNORMAL HIGH (ref 0–4)
LYMPHOCYTES ABSOLUTE COUNT: 0.5 10*9/L — ABNORMAL LOW (ref 1.5–5.0)
LYMPHOCYTES RELATIVE PERCENT: 86.8 %
MEAN CORPUSCULAR HEMOGLOBIN CONC: 34 g/dL (ref 31.0–37.0)
MEAN CORPUSCULAR HEMOGLOBIN: 29.2 pg (ref 26.0–34.0)
MEAN PLATELET VOLUME: 9.3 fL (ref 7.0–10.0)
MONOCYTES RELATIVE PERCENT: 0.7 %
NEUTROPHILS ABSOLUTE COUNT: 0 10*9/L — CL (ref 2.0–7.5)
NEUTROPHILS RELATIVE PERCENT: 5.6 %
PLATELET COUNT: 212 10*9/L (ref 150–440)
RED BLOOD CELL COUNT: 2.65 10*12/L — ABNORMAL LOW (ref 4.50–5.90)
RED CELL DISTRIBUTION WIDTH: 16.9 % — ABNORMAL HIGH (ref 12.0–15.0)
WBC ADJUSTED: 0.6 10*9/L — ABNORMAL LOW (ref 4.5–11.0)

## 2018-11-26 LAB — CREATININE: Creatinine:MCnc:Pt:Ser/Plas:Qn:: 1.26

## 2018-11-26 LAB — SLIDE REVIEW

## 2018-11-26 LAB — MEAN CORPUSCULAR HEMOGLOBIN: Lab: 29.2

## 2018-11-26 LAB — POLYCHROMASIA

## 2018-11-26 NOTE — Unmapped (Signed)
Pt received in NAD. PICC accessed per protocol and positive blood return noted. Labs drawn and sent. PICC line dressing changed per protocol. PICC flushed per protocol. Patient discharged ambulatory in NAD to MD visit.

## 2018-11-26 NOTE — Unmapped (Signed)
Please leave dressing in place and keep it dry for 24 hrs before removing. You can resume normal activities tomorrow, but take things easy today.     You may take tylenol as needed for discomfort at the area where the biopsy was taken.   After receiving Ativan, please do not drive today.     If you have questions between 8am to 5 pm Monday through Friday please call [phone] and speak to the operator.      For emergencies, evenings or weekends, please call [phone] and ask for oncology fellow on call.     Reasons to call emergency line may include:   Fever of 100.5 or greater   Nausea and/or vomiting not relieved with nausea medicine   Diarrhea or constipation   Severe pain not relieved with usual pain regimen       Lab on 11/26/2018   Component Date Value Ref Range Status   ??? WBC 11/26/2018 0.6* 4.5 - 11.0 10*9/L Final   ??? RBC 11/26/2018 2.65* 4.50 - 5.90 10*12/L Final   ??? HGB 11/26/2018 7.8* 13.5 - 17.5 g/dL Final   ??? HCT 29/56/2130 22.8* 41.0 - 53.0 % Final   ??? MCV 11/26/2018 86.0  80.0 - 100.0 fL Final   ??? MCH 11/26/2018 29.2  26.0 - 34.0 pg Final   ??? MCHC 11/26/2018 34.0  31.0 - 37.0 g/dL Final   ??? RDW 86/57/8469 16.9* 12.0 - 15.0 % Final   ??? MPV 11/26/2018 9.3  7.0 - 10.0 fL Final   ??? Platelet 11/26/2018 212  150 - 440 10*9/L Final   ??? Variable HGB Concentration 11/26/2018 Slight* Not Present Final   ??? Neutrophils % 11/26/2018 5.6  % Final   ??? Lymphocytes % 11/26/2018 86.8  % Final   ??? Monocytes % 11/26/2018 0.7  % Final   ??? Eosinophils % 11/26/2018 0.6  % Final   ??? Basophils % 11/26/2018 1.2  % Final   ??? Absolute Neutrophils 11/26/2018 0.0* 2.0 - 7.5 10*9/L Final   ??? Absolute Lymphocytes 11/26/2018 0.5* 1.5 - 5.0 10*9/L Final   ??? Absolute Monocytes 11/26/2018 0.0* 0.2 - 0.8 10*9/L Final   ??? Absolute Eosinophils 11/26/2018 0.0  0.0 - 0.4 10*9/L Final   ??? Absolute Basophils 11/26/2018 0.0  0.0 - 0.1 10*9/L Final   ??? Large Unstained Cells 11/26/2018 5* 0 - 4 % Final   ??? Anisocytosis 11/26/2018 Slight* Not Present Final   ??? Hypochromasia 11/26/2018 Slight* Not Present Final

## 2018-11-26 NOTE — Unmapped (Signed)
Received in room 9, for PRBC transfusion x 2 units.

## 2018-11-26 NOTE — Unmapped (Signed)
Procedures  Date of Service: 11/26/2018      Patient Active Problem List   Diagnosis   ??? Chronic kidney disease (CKD), stage III (moderate) (CMS-[name])   ??? Hyperlipidemia associated with type 2 diabetes mellitus (CMS-[name])   ??? Type 2 diabetes mellitus with diabetic polyneuropathy, with long-term current use of insulin (CMS-[name])   ??? Aortic valve endocarditis   ??? Hypertension   ??? 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 Weakness of both lower extremities   ??? Acute myeloid leukemia not having achieved remission (CMS-[name])       Indication:    Diagnosis ICD-10-CM Associated Orders   1. Acute myeloid leukemia not having achieved remission (CMS-[name]) C92.00 Bone Marrow Biopsy & Aspiration     Bone Marrow Biopsy & Aspiration     Hematopathology Order     Cytogenetics Cancer/FISH NON-BLOOD     AML MRD, Flow, Bone Marrow (Sendout)     NPM1, Quantitative Type A     Hematopathology Order     Hematopathology Order     Cytogenetics Cancer/FISH NON-BLOOD     Cytogenetics Cancer/FISH NON-BLOOD     NPM1, Quantitative Type A     NPM1, Quantitative Type A     Type and Screen     Type and Screen     Comprehensive Metabolic Panel     Comprehensive Metabolic Panel       Premedication: None    Ordering Provider: Tessa Lerner, MD    Clinician(s) Performing Procedure:  Ivor Costa. Thera Flake, PA-C        Bone Marrow Aspirate and Biopsy, right side    The procedure risks and alternatives of the procedure were explained to the patient.  The patient verbalized understanding and signed informed consent. After a time-out in which his patient identifiers were checked by 2 providers, the patient was laid in prone position on the table.   The  posterior superior iliac spine and iliac crest were cleaned, prepped and draped in the usual sterile fashion.     Anesthetic agent used: 2% plain lidocaine.      Utilizing a Ranfac needle, a bone marrow aspiration and biopsy was performed.  No aspirate could be obtained in 3 separate sites, so 2 core biopsies were performed. Specimen was sent for routine histopathologic stains and sectioning, flow cytometry and cytogenetics.     A pressure dressing was applied to the biopsy site.  Patient tolerated the procedure well.  Hemostasis was confirmed upon discharge.     The patient was given verbal instructions for wound care, such as to keep the biopsy site dry, covered for 24 hours, and to call your physician for a temperature > 100.5.  Tylenol may be taken for discomfort.    Specimens Collected:  EDTA x 0  Heparin x 0  Core biopsy x 2

## 2018-11-26 NOTE — Unmapped (Signed)
BMBx complete, patient to receive PRBC 2 units, TNS sent to blood bank, CMP specimen sent to lab.

## 2018-11-26 NOTE — Unmapped (Signed)
In room 11, time out done, bone marrow biopsy in progress.

## 2018-11-26 NOTE — Unmapped (Signed)
Patient transferred to room 9 for PRBC transfusions via wheelchair, @ 1355

## 2018-11-27 ENCOUNTER — Ambulatory Visit
Admit: 2018-11-27 | Discharge: 2018-11-28 | Payer: MEDICARE | Attending: Infectious Disease | Primary: Infectious Disease

## 2018-11-27 DIAGNOSIS — I330 Acute and subacute infective endocarditis: Secondary | ICD-10-CM | POA: Diagnosis not present

## 2018-11-27 MED ORDER — POSACONAZOLE 100 MG TABLET,DELAYED RELEASE
ORAL_TABLET | Freq: Every day | ORAL | 3 refills | 0 days | Status: CP
Start: 2018-11-27 — End: 2019-01-15

## 2018-11-27 MED ORDER — CEFTRIAXONE IVPB 2 GRAM CONNECTOR BAG
INTRAVENOUS | 0 refills | 0.00000 days | Status: CP
Start: 2018-11-27 — End: 2018-12-04

## 2018-11-27 MED ORDER — VANCOMYCIN 1 GRAM/200 ML IN DEXTROSE 5 % INTRAVENOUS PIGGYBACK
INTRAVENOUS | 0 refills | 0 days | Status: CP
Start: 2018-11-27 — End: 2018-12-04

## 2018-11-27 NOTE — Unmapped (Signed)
Continue IV vancomycin and ceftriaxone until repeat echo can be performed.     Continue micafungin IV and valacyclovir for prevention of infection.

## 2018-11-27 NOTE — Unmapped (Signed)
Encounter addended by: Karie Georges, RN on: 11/26/2018 6:24 PM   Actions taken: Flowsheet accepted

## 2018-11-27 NOTE — Unmapped (Signed)
Kyle Huffman was due for a vancomycin level yesterday 12/23, however it was drawn while the vanc was running and the result was 32, which is not insightful for dosing. I provided instructions to get a trough today 12/24, however patient came to clinic for appointment and did not get dose NOR a trough level this morning. I instructed to get a trough when he returns and I may be able to extrapolate, however it may not come back in time for follow-up.     Based off previous levels and PK, my recommendation was to continue 1 g daily including today's dose upon return to SNF.    The plan after discussing with ICID is to get an ECHO on Thursday to confirm improvement of endocarditis prior to stopping the vancomycin and ceftriaxone. Therefore the end date on those IV abx was extended to 12/13/18, but may stop sooner based on the ECHO. I will follow-up with SNF pharmacist following the results of the ECHO to inform them if they need to stop or continue.     I send a prescription for posaconazole 300 mg daily for access investigation only. He shall remain on micafungin IV while in the SNF. When he starts posa as an outpatient he will need to dose reduce venetoclax to 100 mg daily.       Manfred Arch, PharmD, BCOP, CPP  Pager: [phone]

## 2018-11-27 NOTE — Unmapped (Signed)
Initial Visit Note    Patient Name: ZAEVION PARKE  Patient Age: 67 y.o.  Encounter Date: 11/29/2018    Referring Physician:     External Ordering Provider  No address on file    Primary Care Provider:  Lauro Regulus    Reason for visit: New Patient Appointment for Newly Diagnosed AML    Assessment:  Mr. 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     I had an extensive discussion with Mr. Alcock regarding his diagnosis, prognosis and management. We reviewed the findings of his diagnostic bone marrow biopsy revealing AML. We reviewed risk classification of AML and given his poor-risk mutations, which are suggestive of a preexisting CMML (Johnson et al. Noel Christmas 2019), he would be considered adverse-risk AML. We discussed that treatment in adverse-risk AML is predicated on improving survival and QOL but there is no curative therapy for patients with AML with the exception of an allogeneic stem cell transplant. However, he is not currently a candidate for a transplant given his comorbidities and performance status though can continue to discuss this option at a later date.     Mr. Berti received first-line therapy with Azacitidine + Venetoclax on 11/27 and completed 1 cycle of therapy. His repeat BM biopsy did not reveal a response as he has persistent AML with 47% blasts. We discussed that median time to response with Azacitidine + Venetoclax is 1.2 months but I generally recommend 2 cycles of treatment before declaring no response to therapy. Interestingly, he has fully recovered his platelet count which is a favorable sign and may still achieve a response with another cycle of treatment. Therefore, I would recommend continuing treatment for an additional cycle without delays or dose reductions with a repeat BM biopsy after cycle 2. He will begin cycle 2 of Azacitidine + Venetoclax on Monday, 12/30. He will require labs 2x/week for transfusion support during cycle 2. He will require a repeat BM biopsy after cycle 2 during the week of 01/20 to evaluate whether he has had a response to therapy. If he has not had a response in 2 cycles of treatment, he is unlikely to benefit further unless he is having significant clinical benefit with improvement in blood counts, etc.     Prophylaxis- while in SNF, cont. Micafungin. When he is discharged home from SNF, would recommend adding Posaconazole and decreasing Venetoclax to 100 mg daily. This will need to be coordinated when he is discharged.     We discussed a possible clinical trial with Alvocidib +/- LDAC if he does not achieve a response to Azacitidine + Venetoclax. The rationale for this trial is that MCL-1 is a dominant resistance mechanism for patients who do not respond or relapse after Venetoclax therapy in AML and Alvocidib is a potent MCL-1 inhibitor that may have clinical activity in this setting. Alvocidib has been studied in >400 AML patients over the last 18 years with promising clinical activity in AML. This is the first study examining its activity after Venetoclax treatment. Moreover, overall outcomes are dismal in patients who do not respond to Azacitidine + Venetoclax (median OS = 2.5 months)- Maiti et al. Fara Boros Abstract 2019. Thus, investigational agents are clearly indicated in this setting. He may or may not be a candidate  for this depending upon his overall performance status over the next month but he appears to be improving and he is potentially interested in participating.     Lastly, given his monocytic AML, presentation, and high WBC (36,000) at diagnosis, I would recommend an LP with IT cytarabine chemoppx. We will plan to arrange this during the next week.     Aortic Valve Endocarditis- Repeat Echocardiogram today per ICID to determine course of IV Vancomycin and Cefepime. 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     Plans and Recommendations:  1) Cycle 2 of Azacitidine 75 mg/m2 SQ days 1-7 + Venetoclax 400 mg daily to begin today, 12/26-12/31 and then 7th dose on 01/02  2) Labs 1x/week  3) Echocardiogram today to determine course of IV Abx- if no evidence of residual endocarditis- will d/c IV Vancomycin and Cefepime and switch to Levaquin  4) When discharged from SNF- switch Micafungin to Posaconazole 300 mg daily and decrease venetoclax to 100 mg daily  5) Repeat BM biopsy after cycle 2  6) Consider TPI-202 if no response  7) RTC in 1 month after next BM biopsy    History of Present Illness:   Mr. Withrow is a 67 yo M with past medical history significant for DM, HL, and prior stroke who presented to [facility] after an admission at OSH with bilateral lower extremity weakness and fatigue. He was transferred to [facility] on 10/26/2018 with concerns for either BPDCN or AML. He received a diagnostic bone marrow biopsy on 11/22 revealing AML (details shown below). He was deemed not to be an appropriate candidate for intensive chemotherapy and was therefore started on Azacitidine + Venetoclax on 10/31/2018. His course was c/b culture negative sepsis and aortic valve endocarditis requiring 6 weeks of IV Abx. He was discharged to SNF on 12/10 and continued to receive labs and transfusion support on discharge.     Oncology History    Diagnosis: de novo AML    Presentation: Weakness, pancytopenia    Diagnostic BM Biopsy: 10/26/2018    Markedly hypercellular bone marrow (>95%) involved by acute myeloid leukemia with monocytic differentiation (65% blasts by manual aspirate differential) (see Comment)     Cytogenetics: 47,XY,+8[10]/46~47,sl,der(15)t(3;15)(q?21;q26.3)[3]/46,XY[7]    Myeloid Mutational Panel:   Variants of Known or Likely Clinical Significance   Gene  Transcript   Predicted Protein   VAF (%)    ASXL1  c.1934dupG p.Gly646Trpfs  44    CBL  c.1111T>C  p.Tyr371His  43    NPM1  c.860_863dup  p.Trp288Cysfs  19    SRSF2  c.284C>A  p.Pro95His  15    SRSF2  c.284C>T  p.Pro95Leu  27    TET2  c.1318C>T  p.Gln440*  14    TET2  Z.6109_6045WUJ  p.Thr1554Serfs  30   ??TET2  c.5126_5142delinsAGACCATTA  p.Cys1709*  47   ??    Variants of Unknown Significance   Gene  Transcript  Predicted Protein  VAF (%)   ??ETV6  c.914A>G  p.Asn305Ser  29     Presented with culture-negative sepsis and Aortic Valve endocarditis    Treatment: Azacitidine + Venetoclax  - Cycle 1: 10/31/2018    Treatment c/b     Final Diagnosis   Date Value Ref Range Status   11/26/2018   Final    Bone marrow, right iliac, biopsy  -  Variably-cellular bone marrow (<10-40%) with persistent acute myeloid leukemia (47% blasts by manual touch preparation differential)        This electronic signature is attestation that the  pathologist personally reviewed the submitted material(s) and the final diagnosis reflects that evaluation.                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       Interim history:  He is feeling better, now has an appetite. He is eating more sweets than usual. Energy is better. He is able to walk on his own but is mainly in wheelchair at this point. He is doing physical therapy in nursing facility. No fevers, night sweats, chills. He thinks he will be discharged in 1-2 weeks.     Review of Systems:   12-system ROS otherwise negative except otherwise stated    Allergies:  No Known Allergies    Medications:     Current Outpatient Medications:   ???  amLODIPine (NORVASC) 10 MG tablet, Take 1 tablet (10 mg total) by mouth daily., Disp: 90 tablet, Rfl: 3  ???  cefTRIAXone 2 g in sodium chloride 0.9 % 0.9 % 100 mL IVPB, Infuse 2 g into a venous catheter daily for 16 days., Disp: 1 each, Rfl: 0  ???  cyclobenzaprine (FLEXERIL) 5 MG tablet, TAKE 1 TABLET BY MOUTH THREE TIMES A DAY AS NEEDED FOR MUSCLE SPASMS, Disp: , Rfl: 0  ???  insulin glargine (LANTUS) 100 unit/mL injection, Inject 0.15 mL (15 Units total) under the skin nightly., Disp: 4.5 mL, Rfl: 0  ???  insulin lispro (HUMALOG) 100 unit/mL injection, Inject 0-0.12 mL (0-12 Units total) under the skin Four (4) times a day (before meals and nightly). See discharge summary for titration., Disp: 20 mL, Rfl: 0  ???  micafungin (MYCAMINE) 50 mg injection, Infuse into a venous catheter., Disp: , Rfl:   ???  micafungin 50 mg, OVERFILL 10 mL in sodium chloride 0.9 % 100 mL IVPB, Infuse 50 mg into a venous catheter daily., Disp: 1 each, Rfl: 0  ???  mirtazapine (REMERON) 7.5 MG tablet, Take 1 tablet (7.5 mg total) by mouth nightly., Disp: 90 tablet, Rfl: 3  ???  OLANZapine zydis (ZYPREXA) 5 MG disintegrating tablet, Take 0.5 tablets (2.5 mg total) by mouth nightly., Disp: 15 tablet, Rfl: 0  ???  ondansetron (ZOFRAN-ODT) 4 MG disintegrating tablet, Take 1 tablet (4 mg total) by mouth every eight (8) hours as needed for nausea., Disp: 30 tablet, Rfl: 0  ???  ONETOUCH VERIO Strp, , Disp: , Rfl:   ???  posaconazole (NOXAFIL) 100 mg TbEC delayed released tablet, Take 3 TABLETS (300 mg) by mouth daily., Disp: 90 tablet, Rfl: 3  ???  pravastatin (PRAVACHOL) 10 MG tablet, Take 1 tablet (10 mg total) by mouth nightly., Disp: 30 tablet, Rfl: 0  ???  valACYclovir (VALTREX) 500 MG tablet, Take 1 tablet (500 mg total) by mouth daily., Disp: , Rfl: 0  ???  vancomycin (VANCOCIN) 1  gram/200 mL IVPB, Infuse 200 mL (1,000 mg total) into a venous catheter daily for 16 days., Disp: 3800 mL, Rfl: 0  ???  venetoclax (VENCLEXTA) 100 mg tablet, Take 4 tablets (400 mg total) by mouth daily with a meal and water. Do not chew, crush, or break tablets., Disp: 120 tablet, Rfl: 2    Medical History:  Past Medical History:   Diagnosis Date   ??? Diabetes mellitus (CMS-[name])    ??? Hyperlipidemia    ??? Hypertension    ??? Stroke (CMS-[name])    - 15 years since last stroke    Social History:   Lives alone- in [place], [place]  Occupation- Retired, Location manager  Smoke- 1 PPD- quit 25 years ago  Alcohol- none  Drug- none    Family History:  Family History   Problem Relation Age of Onset   ??? Heart disease Father    ??? Hypertension Father    ??? Diabetes Father    ??? Brain cancer Mother    ??? Heart disease Sister      ECOG PS = 2    Objective:   There were no vitals taken for this visit.    Physical Exam:  General: NAD, appeared stated age  Skin: No rashes  HEENT: PERRL, EOMI  Lymph Node Exam: No LAD in submandibular region b/l  CVS: RRR, no M/R/G  Lungs: Clear bilaterally without rales or rhonchi  Abdomen: Soft, NT/ND, no splenomegaly  Musculoskeletal: Good strength in UE/LE bilaterally symmetrically  Neuro: CN intact   Lower Extremities: WWP, no edema bilaterally    Test Results:  Recent Results (from the past 24 hour(s))   CBC w/ Differential    Collection Time: 11/26/18 12:38 PM   Result Value Ref Range    WBC 0.6 (L) 4.5 - 11.0 10*9/L    RBC 2.65 (L) 4.50 - 5.90 10*12/L    HGB 7.8 (L) 13.5 - 17.5 g/dL    HCT 16.1 (L) 09.6 - 53.0 %    MCV 86.0 80.0 - 100.0 fL    MCH 29.2 26.0 - 34.0 pg    MCHC 34.0 31.0 - 37.0 g/dL    RDW 04.5 (H) 40.9 - 15.0 %    MPV 9.3 7.0 - 10.0 fL    Platelet 212 150 - 440 10*9/L    Variable HGB Concentration Slight (A) Not Present    Neutrophils % 5.6 %    Lymphocytes % 86.8 %    Monocytes % 0.7 %    Eosinophils % 0.6 %    Basophils % 1.2 %    Absolute Neutrophils 0.0 (LL) 2.0 - 7.5 10*9/L Absolute Lymphocytes 0.5 (L) 1.5 - 5.0 10*9/L    Absolute Monocytes 0.0 (L) 0.2 - 0.8 10*9/L    Absolute Eosinophils 0.0 0.0 - 0.4 10*9/L    Absolute Basophils 0.0 0.0 - 0.1 10*9/L    Large Unstained Cells 5 (H) 0 - 4 %    Anisocytosis Slight (A) Not Present    Hypochromasia Slight (A) Not Present   Morphology Review    Collection Time: 11/26/18 12:38 PM   Result Value Ref Range    Smear Review Comments See Comment (A) Undefined    Polychromasia Slight (A) Not Present   Hematopathology Order    Collection Time: 11/26/18  1:30 PM   Result Value Ref Range    Case Report       Surgical Pathology Report  Case: [id number]                                 Authorizing Provider:  Darel Hong, [place] Collected:           11/26/2018 1330              Ordering Location:     [facility]      Received:            11/26/2018 1352                                     [place]                                                                  Pathologist:           Michelene Heady, MD                                                    Specimens:   A) - Bone Marrow Right - Biopsy                                                                     B) - Peripheral Blood                                                                      Final Diagnosis       Bone marrow, right iliac, biopsy  -  Variably-cellular bone marrow (<10-40%) with persistent acute myeloid leukemia (47% blasts by manual touch preparation differential)        This electronic signature is attestation that the pathologist personally reviewed the submitted material(s) and the final diagnosis reflects that evaluation.      Clinical History       The patient is a 67 year old male with a history of acute myeloid leukemia with mutated NPM1 and monocytic differentiation who is currently being treated with venetoclax/azacitidine.        Gross Description       Received are two right iliac biopsies measuring 0.2 cm x 0.8 cm and 0.2 cm x 1.1 cm, respectively. Both biopsies are sent for histologic evaluation. No bone marrow aspirate is obtained for ancillary cytogenetic, molecular, or flow cytometry MRD studies.    A. BMBR. Biopsy is submitted in one block(s).        Microscopic Description       Microscopic examination is performed by  Dr. Rupert Stacks and substantiates the above diagnosis.    Peripheral Blood:  Platelets:  Adequate  Erythroid:  Mild anisocytosis  Leukocytes:  Leukopenia with neutropenia and lymphopenia    Bone Marrow Aspirate:  No aspirate is obtained.    Bone marrow Touch Prep:Cellularity:  Megakaryocytes:  Not readily identified  Erythropoiesis:  Markedly decreased  Granulopoiesis:  Markedly decreased  Other:   Increased blasts    M:E ratio: N/A Differential: 100 cells counted on touch prep/hl    47% blasts  3% promyelocytes  3% erythroid  24% lymphocytes  1% monocytes  22% plasma cells     Bone Marrow Clot and/or Biopsy:    The bone marrow biopsy is a fragmented sampling of hypocellular bone marrow with crush artifact and bone changes suggestive of ongoing remodeling of prior biopsy site. There is a focal area of immaturity that confirms the touch prep impression and differential count.      Cellularity:  Biopsy: Variable, <10-40%    Resident Physician: None Assigned      EMBEDDED IMAGES      Disclaimer       Unless otherwise specified, specimens are preserved using 10% neutral buffered formalin. For cases in which immunohistochemical and/or in-situ hybridization stains are performed, the following statement applies: Appropriate controls for each stain (positive controls with or without negative controls) have been evaluated and stain as expected. These stains have not been separately validated for use on decalcified specimens and should be interpreted with caution in that setting. Some of the reagents used for these stains may be classified as analyte specific reagents (ASR). Tests using ASRs were developed, and their performance characteristics were determined, by the Anatomic Pathology Department [facility]). They have not been cleared or approved by the [place] Food and Drug Administration (FDA). The FDA does not require these tests to go through premarket FDA review. These tests are used for clinical purposes. They should not be regarded as investigational or for research. This laboratory is certified under the Clinical Laboratory Improvement Amendments (CLIA) as qualified to perform high complexity clinical laboratory testing.     Type and Screen    Collection Time: 11/26/18  1:47 PM   Result Value Ref Range    ABO Grouping A POS     Antibody Screen NEG    Comprehensive Metabolic Panel    Collection Time: 11/26/18  1:49 PM   Result Value Ref Range    Sodium 136 135 - 145 mmol/L    Potassium 4.0 3.5 - 5.0 mmol/L    Chloride 104 98 - 107 mmol/L    CO2 22.0 22.0 - 30.0 mmol/L    BUN 12 7 - 21 mg/dL    Creatinine 0.27 2.53 - 1.30 mg/dL    BUN/Creatinine Ratio 10     EGFR CKD-EPI Non-African American, Male 58 (L) >=60 mL/min/1.38m2    EGFR CKD-EPI African American, Male 26 >=60 mL/min/1.39m2    Glucose 202 (H) 70 - 179 mg/dL    Calcium 7.9 (L) 8.5 - 10.2 mg/dL    Albumin 2.9 (L) 3.5 - 5.0 g/dL    Total Protein 5.5 (L) 6.5 - 8.3 g/dL    Total Bilirubin 0.4 0.0 - 1.2 mg/dL    AST 13 (L) 19 - 55 U/L    ALT 11 <50 U/L    Alkaline Phosphatase 486 (H) 38 - 126 U/L    Anion Gap 10 7 - 15 mmol/L   Prepare RBC    Collection Time: 11/26/18  4:47 PM   Result Value Ref Range    Crossmatch Compatible     Unit Blood Type A Pos     ISBT Number 6200     Unit # [id number]     Status Issued     Spec Expiration [id number]     Product ID Red Blood Cells     PRODUCT CODE [id number]     Crossmatch Compatible     Unit Blood Type A Pos     ISBT Number 6200     Unit # [id number]     Status Issued     Spec Expiration [id number]     Product ID Red Blood Cells     PRODUCT CODE [id number]          Cindra Presume, MD

## 2018-11-27 NOTE — Unmapped (Signed)
[facility] Specialty Medication Referral: No PA required    Medication (Brand/Generic): POSACONAZOLE 100 MG TABLET    Initial Benefits Investigation Claim completed with resulted information below:  No PA required  Patient ABLE to fill at [facility]  Insurance Company:  [facility] AETNA MEDICARE PART D  Anticipated Copay: $3.40    As Co-pay is under $25 defined limit, per policy there will be no further investigation of need for financial assistance at this time unless patient requests. This referral has been communicated to the provider and handed off to the [facility] team for further processing and filling of prescribed medication.   ______________________________________________________________________  Please utilize this referral for viewing purposes as it will serve as the central location for all relevant documentation and updates.

## 2018-11-27 NOTE — Unmapped (Signed)
IMMUNOCOMPROMISED HOST INFECTIOUS DISEASE PROGRESS NOTE    Assessment/Plan:     [name] is a 67 y.o. male who presents for hospital follow up for AV endocarditis    ID Problem List:  # AML dx 10/26/2018  - MRI 10/19/2014 concerning for diffuse metastatic diseaseaffecting almost all vertebral bodies in the thoracic and lumbar spine  - azacitidine + venetoclax (D1=10/31/18)  - neutropenia<0.5 since 11/09/2018    #Culture negative??aortic valve??endocarditis 10/16/2018  - tooth extraction 10/01/2018  - 11/25 TTE AV mobile veg 7x52mm, no AR  - s/p 6 weeks vanc/ceftriaxone    #Comorbidities  - 10/19/18 MRI brain No acute intracranial process. LEFT parietal/MCA territory infarct. Old RIGHT thalamus infarct.LEFT inferior frontal lobe encephalomalacia most compatible with neonatal insult/TBI  - 10/20/2018 Indeterminate 0.6 cm right lower lobe pulmonary nodule  - DM2, HA1c 6.3 on 22/33/2019    Recommendations:  1. Repeat limited TTE to follow up AV vegetation on 12/26  2. Continue ceftriaxone and vancomycin until TTE results are confirmed (reordered until 1/9). Ceftriaxone could be continued at that time for NF ppx or he could switch to cefdinir or levofloxacin.   2. Continue prophylactic micafungin and valacyclovir. While posaconazole has a broader spectrum than micafungin for prophylaxis, I think that receiving IV therapy at the SNF is in his best interest at this time particularly since he will need venetoclax dose adjustment when he starts posaconazole.   4. At some point, he should go back to dermatology for skin evaluation.  5. I forgot to offer him an influenza vaccine today, but he should be offered this despite chemotherapy at his next clinic visit.     Kyle Huffman is currently receiving drug therapy requiring intensive lab monitoring for toxicity. Continue weekly safety labs.     I discussed the case with Dr. Fredderick Phenix of oncology pharmacy who has been managing his vancomycin levels. She is happy with the current dosing but will follow up the pending level from today.     Follow up 2 weeks or PRN if TTE shows that vegetation has resolved.     Subjective     Interval History:    I reviewed and summarized the medical records from hospitalization on 11/22-12/10 which illustrated AV endocarditis with negative cultures and new start chemo for new AML dx.   He is currently at [facility] in [place].  He is accompanied by his brother who helps to provide the history.     No f/c/ns.  No further leg pain.  Strength is better.  Appetite is poor.  Diabetes control is ok.  No isolated weakness.  No vision problems.  Line in place for ~2 weeks in place in right arm.  Confusion is better.     Medications:  Antimicrobials: vanc 1g q24, ceftriaxone 2 q24, mica 50 q24, valacyclovir 500mg  a24  Prior/Current immunomodulators: aza+venetoclax  Other medications reviewed.    Objective     Vital signs:  BP 94/53  - Pulse 91  - Temp 36.8 ??C (98.3 ??F) (Oral)  - Resp 18  - Ht 177.8 cm (5' 10)  - Wt 62 kg (136 lb 9.6 oz)  - SpO2 99%  - BMI 19.60 kg/m??     Physical Exam:  GEN:  Chronically ill-appearing but in NAD  EYES: sclerae anicteric and non injected  ENT:no thrush, leukoplakia or oral lesions  PICC line right arm C/D/I  CV:RRR, no abnormal heart sounds noted and no peripheral edema  PULM:normal work of breathing  at rest  ZO:XWRU, NT  SKIN:scaling plaques on bilateral dorsal arms, occ ecchymoses  MSK:no vertebral point tenderness  NEURO:he moves quickly from bench to wheelchair but did not confirm chair stability, no tremor noted, facial expression symmetric and moves extremities equally  PSYCH:attentive, he does not always respond appropriately    Labs:  Lab Results   Component Value Date    WBC 0.6 (L) 11/26/2018    WBC 0.6 (L) 11/21/2018    WBC 0.5 (L) 11/13/2018    HGB 7.8 (L) 11/26/2018    HCT 22.8 (L) 11/26/2018    Platelet 212 11/26/2018    Absolute Neutrophils 0.0 (LL) 11/26/2018    Absolute Lymphocytes 0.5 (L) 11/26/2018 Absolute Eosinophils 0.0 11/26/2018    Sodium 136 11/26/2018    Potassium 4.0 11/26/2018    BUN 12 11/26/2018    Creatinine 1.26 11/26/2018    Creatinine 1.25 11/21/2018    Creatinine 1.43 (H) 11/13/2018    Glucose 202 (H) 11/26/2018    Magnesium 1.8 10/31/2018    Albumin 2.9 (L) 11/26/2018    Total Bilirubin 0.4 11/26/2018    AST 13 (L) 11/26/2018    ALT 11 11/26/2018    Alkaline Phosphatase 486 (H) 11/26/2018    INR 1.27 11/12/2018       Microbiology:  No new positives    Imaging:  MRI brain and spine reviewed.   PET CT reviewed  11/25 TTE The aortic valve is not well visualized. The aortic valve is trileaflet and mildly thickened with normal excursion.   There is no significant aortic regurgitation by color Doppler imaging.  A 7mm x 9mm mobile vegetation is present on the noncoronary cusp.

## 2018-11-27 NOTE — Unmapped (Signed)
Pt tolerated tx well. Pt amble and exited clinic in no acute distress.

## 2018-11-27 NOTE — Unmapped (Signed)
2nd unit of PRBCs transfusing, report given to Niel Hummer RN

## 2018-11-29 ENCOUNTER — Ambulatory Visit: Admit: 2018-11-29 | Discharge: 2018-11-30 | Payer: MEDICARE

## 2018-11-29 ENCOUNTER — Other Ambulatory Visit: Admit: 2018-11-29 | Discharge: 2018-11-30 | Payer: MEDICARE

## 2018-11-29 ENCOUNTER — Ambulatory Visit
Admit: 2018-11-29 | Discharge: 2018-11-30 | Payer: MEDICARE | Attending: Hematology & Oncology | Primary: Hematology & Oncology

## 2018-11-29 DIAGNOSIS — Z5111 Encounter for antineoplastic chemotherapy: Secondary | ICD-10-CM | POA: Diagnosis not present

## 2018-11-29 DIAGNOSIS — Z87891 Personal history of nicotine dependence: Secondary | ICD-10-CM | POA: Diagnosis not present

## 2018-11-29 DIAGNOSIS — I330 Acute and subacute infective endocarditis: Secondary | ICD-10-CM | POA: Diagnosis not present

## 2018-11-29 DIAGNOSIS — Z8673 Personal history of transient ischemic attack (TIA), and cerebral infarction without residual deficits: Secondary | ICD-10-CM | POA: Diagnosis not present

## 2018-11-29 DIAGNOSIS — E785 Hyperlipidemia, unspecified: Secondary | ICD-10-CM | POA: Diagnosis not present

## 2018-11-29 DIAGNOSIS — Z79899 Other long term (current) drug therapy: Secondary | ICD-10-CM | POA: Diagnosis not present

## 2018-11-29 DIAGNOSIS — E119 Type 2 diabetes mellitus without complications: Secondary | ICD-10-CM | POA: Diagnosis not present

## 2018-11-29 DIAGNOSIS — C9200 Acute myeloblastic leukemia, not having achieved remission: Secondary | ICD-10-CM | POA: Diagnosis not present

## 2018-11-29 DIAGNOSIS — I10 Essential (primary) hypertension: Secondary | ICD-10-CM | POA: Diagnosis not present

## 2018-11-29 DIAGNOSIS — Z794 Long term (current) use of insulin: Secondary | ICD-10-CM | POA: Diagnosis not present

## 2018-11-29 LAB — SMEAR REVIEW

## 2018-11-29 LAB — COMPREHENSIVE METABOLIC PANEL
ALBUMIN: 3.1 g/dL — ABNORMAL LOW (ref 3.5–5.0)
ALKALINE PHOSPHATASE: 478 U/L — ABNORMAL HIGH (ref 38–126)
ALT (SGPT): 10 U/L (ref <50)
ANION GAP: 11 mmol/L (ref 7–15)
BILIRUBIN TOTAL: 0.4 mg/dL (ref 0.0–1.2)
BLOOD UREA NITROGEN: 11 mg/dL (ref 7–21)
CALCIUM: 7.9 mg/dL — ABNORMAL LOW (ref 8.5–10.2)
CHLORIDE: 105 mmol/L (ref 98–107)
CO2: 22 mmol/L (ref 22.0–30.0)
CREATININE: 1.27 mg/dL (ref 0.70–1.30)
EGFR CKD-EPI AA MALE: 67 mL/min/{1.73_m2} (ref >=60)
EGFR CKD-EPI NON-AA MALE: 58 mL/min/{1.73_m2} — ABNORMAL LOW (ref >=60)
GLUCOSE RANDOM: 221 mg/dL — ABNORMAL HIGH (ref 70–179)
POTASSIUM: 4 mmol/L (ref 3.5–5.0)
PROTEIN TOTAL: 5.8 g/dL — ABNORMAL LOW (ref 6.5–8.3)
SODIUM: 138 mmol/L (ref 135–145)

## 2018-11-29 LAB — CBC W/ AUTO DIFF
BASOPHILS ABSOLUTE COUNT: 0 10*9/L (ref 0.0–0.1)
BASOPHILS RELATIVE PERCENT: 0.4 %
EOSINOPHILS ABSOLUTE COUNT: 0 10*9/L (ref 0.0–0.4)
EOSINOPHILS RELATIVE PERCENT: 0.2 %
HEMATOCRIT: 30.3 % — ABNORMAL LOW (ref 41.0–53.0)
HEMOGLOBIN: 10.6 g/dL — ABNORMAL LOW (ref 13.5–17.5)
LYMPHOCYTES ABSOLUTE COUNT: 0.5 10*9/L — ABNORMAL LOW (ref 1.5–5.0)
LYMPHOCYTES RELATIVE PERCENT: 81.6 %
MEAN CORPUSCULAR HEMOGLOBIN CONC: 34.9 g/dL (ref 31.0–37.0)
MEAN CORPUSCULAR HEMOGLOBIN: 30 pg (ref 26.0–34.0)
MEAN CORPUSCULAR VOLUME: 85.9 fL (ref 80.0–100.0)
MEAN PLATELET VOLUME: 9.4 fL (ref 7.0–10.0)
MONOCYTES ABSOLUTE COUNT: 0 10*9/L — ABNORMAL LOW (ref 0.2–0.8)
MONOCYTES RELATIVE PERCENT: 2.4 %
NEUTROPHILS ABSOLUTE COUNT: 0.1 10*9/L — CL (ref 2.0–7.5)
PLATELET COUNT: 119 10*9/L — ABNORMAL LOW (ref 150–440)
RED BLOOD CELL COUNT: 3.53 10*12/L — ABNORMAL LOW (ref 4.50–5.90)
RED CELL DISTRIBUTION WIDTH: 16 % — ABNORMAL HIGH (ref 12.0–15.0)
WBC ADJUSTED: 0.6 10*9/L — ABNORMAL LOW (ref 4.5–11.0)

## 2018-11-29 LAB — SLIDE REVIEW

## 2018-11-29 LAB — EGFR CKD-EPI NON-AA MALE: Lab: 58 — ABNORMAL LOW

## 2018-11-29 LAB — LYMPHOCYTES RELATIVE PERCENT: Lab: 81.6

## 2018-11-29 NOTE — Unmapped (Signed)
Labs drawn and sent for analysis.  Care provided by  Adolph Pollack, RN

## 2018-11-29 NOTE — Unmapped (Signed)
Pt is in clinic today for C1D1 Azacitidine. Pt arrived via wheelchair, in NAD, accompanied by family.   Premeds are given. Azacitidine SQ injections are placed to LLQ abdomen.   Pt declined his copy of AVS today, and was discharged from clinic in NAD, with no complaints voiced.

## 2018-11-29 NOTE — Unmapped (Signed)
It was great to see you today.    We will start the next cycle of azacitidine and venetoclax. Please continue taking venetoclax 400 mg daily.     You will receive azacitidine for 7 days- this will be done Thursday, 12/26-Tuesday, 12/31 and then 7th dose on Thursday, 01/02.     We will need to check your labs in our infusion center once a week.     We will repeat a bone marrow biopsy on Tuesday, 01/21 and we will see you back in clinic on Thursday, 01/23.     You will have an echocardiogram today and I will call you and your facility if we need to make any antibiotic adjustments. If there is no evidence of a heart infection, then we will stop the antibiotics.     If you have questions or concerns at night or on the weekend, call the hospital operator at [phone] and ask to speak to the hematology/oncology fellow on call.  ??  If you have questions or concerns on a week day during business hours, you may call the Nurse call line at [phone].    Lab on 11/29/2018   Component Date Value Ref Range Status   ??? Sodium 11/29/2018 138  135 - 145 mmol/L Final   ??? Potassium 11/29/2018 4.0  3.5 - 5.0 mmol/L Final   ??? Chloride 11/29/2018 105  98 - 107 mmol/L Final   ??? CO2 11/29/2018 22.0  22.0 - 30.0 mmol/L Final   ??? BUN 11/29/2018 11  7 - 21 mg/dL Final   ??? Creatinine 11/29/2018 1.27  0.70 - 1.30 mg/dL Final   ??? BUN/Creatinine Ratio 11/29/2018 9   Final   ??? EGFR CKD-EPI Non-African American,* 11/29/2018 58* >=60 mL/min/1.31m2 Final   ??? EGFR CKD-EPI African American, Male 11/29/2018 67  >=60 mL/min/1.53m2 Final   ??? Glucose 11/29/2018 221* 70 - 179 mg/dL Final   ??? Calcium 29/56/2130 7.9* 8.5 - 10.2 mg/dL Final   ??? Albumin 86/57/8469 3.1* 3.5 - 5.0 g/dL Final   ??? Total Protein 11/29/2018 5.8* 6.5 - 8.3 g/dL Final   ??? Total Bilirubin 11/29/2018 0.4  0.0 - 1.2 mg/dL Final   ??? AST 62/95/2841 13* 19 - 55 U/L Final   ??? ALT 11/29/2018 10  <50 U/L Final   ??? Alkaline Phosphatase 11/29/2018 478* 38 - 126 U/L Final   ??? Anion Gap 11/29/2018 11  7 - 15 mmol/L Final   ??? WBC 11/29/2018 0.6* 4.5 - 11.0 10*9/L Final   ??? RBC 11/29/2018 3.53* 4.50 - 5.90 10*12/L Final   ??? HGB 11/29/2018 10.6* 13.5 - 17.5 g/dL Final   ??? HCT 32/44/0102 30.3* 41.0 - 53.0 % Final   ??? MCV 11/29/2018 85.9  80.0 - 100.0 fL Final   ??? MCH 11/29/2018 30.0  26.0 - 34.0 pg Final   ??? MCHC 11/29/2018 34.9  31.0 - 37.0 g/dL Final   ??? RDW 72/53/6644 16.0* 12.0 - 15.0 % Final   ??? MPV 11/29/2018 9.4  7.0 - 10.0 fL Final   ??? Platelet 11/29/2018 119* 150 - 440 10*9/L Final   ??? Variable HGB Concentration 11/29/2018 Slight* Not Present Final   ??? Neutrophils % 11/29/2018 9.9  % Final   ??? Lymphocytes % 11/29/2018 81.6  % Final   ??? Monocytes % 11/29/2018 2.4  % Final   ??? Eosinophils % 11/29/2018 0.2  % Final   ??? Basophils % 11/29/2018 0.4  % Final   ??? Absolute Neutrophils 11/29/2018  0.1* 2.0 - 7.5 10*9/L Final   ??? Absolute Lymphocytes 11/29/2018 0.5* 1.5 - 5.0 10*9/L Final   ??? Absolute Monocytes 11/29/2018 0.0* 0.2 - 0.8 10*9/L Final   ??? Absolute Eosinophils 11/29/2018 0.0  0.0 - 0.4 10*9/L Final   ??? Absolute Basophils 11/29/2018 0.0  0.0 - 0.1 10*9/L Final   ??? Large Unstained Cells 11/29/2018 6* 0 - 4 % Final   ??? Smear Review Comments 11/29/2018 See Comment* Undefined Final    Smear Reviewed  Myelocytes present.     ??? Basophilic Stippling 11/29/2018 Present* Not Present Final   ??? Dohle Bodies 11/29/2018 Present* Not Present Final   ??? Toxic Granulation 11/29/2018 Present* Not Present Final

## 2018-11-30 ENCOUNTER — Ambulatory Visit: Admit: 2018-11-30 | Discharge: 2018-12-01 | Payer: MEDICARE

## 2018-11-30 DIAGNOSIS — Z5111 Encounter for antineoplastic chemotherapy: Secondary | ICD-10-CM | POA: Diagnosis not present

## 2018-11-30 DIAGNOSIS — C9200 Acute myeloblastic leukemia, not having achieved remission: Secondary | ICD-10-CM | POA: Diagnosis not present

## 2018-11-30 NOTE — Unmapped (Signed)
1413: Pt here for scheduled injection.     1425: Pt tolerated treatment/infusion W/O difficulty.   Pt left infusion center via wheelchair. NAD, no questions nor complaints voiced at D/C. Pt aware of follow up.  Pt declined AVS

## 2018-12-01 ENCOUNTER — Ambulatory Visit: Admit: 2018-12-01 | Discharge: 2018-12-02 | Payer: MEDICARE

## 2018-12-01 DIAGNOSIS — C9200 Acute myeloblastic leukemia, not having achieved remission: Secondary | ICD-10-CM | POA: Diagnosis not present

## 2018-12-01 DIAGNOSIS — Z5111 Encounter for antineoplastic chemotherapy: Secondary | ICD-10-CM | POA: Diagnosis not present

## 2018-12-01 NOTE — Unmapped (Signed)
Pt tolerated tx well. Pt amble and exited clinic in no acute distress.

## 2018-12-01 NOTE — Unmapped (Signed)
Encounter addended by: Nigel Sloop, RN on: 11/30/2018 4:20 PM   Actions taken: Flowsheet accepted

## 2018-12-02 ENCOUNTER — Ambulatory Visit: Admit: 2018-12-02 | Discharge: 2018-12-03 | Payer: MEDICARE

## 2018-12-02 DIAGNOSIS — Z5111 Encounter for antineoplastic chemotherapy: Secondary | ICD-10-CM | POA: Diagnosis not present

## 2018-12-02 DIAGNOSIS — C9200 Acute myeloblastic leukemia, not having achieved remission: Secondary | ICD-10-CM | POA: Diagnosis not present

## 2018-12-02 LAB — COMPREHENSIVE METABOLIC PANEL
ALBUMIN: 3 g/dL — ABNORMAL LOW (ref 3.5–5.0)
ALKALINE PHOSPHATASE: 495 U/L — ABNORMAL HIGH (ref 38–126)
ALT (SGPT): 11 U/L (ref <50)
ANION GAP: 8 mmol/L (ref 7–15)
AST (SGOT): 14 U/L — ABNORMAL LOW (ref 19–55)
BILIRUBIN TOTAL: 0.4 mg/dL (ref 0.0–1.2)
BLOOD UREA NITROGEN: 15 mg/dL (ref 7–21)
BUN / CREAT RATIO: 11
CALCIUM: 7.6 mg/dL — ABNORMAL LOW (ref 8.5–10.2)
CHLORIDE: 104 mmol/L (ref 98–107)
CO2: 24 mmol/L (ref 22.0–30.0)
CREATININE: 1.38 mg/dL — ABNORMAL HIGH (ref 0.70–1.30)
EGFR CKD-EPI NON-AA MALE: 52 mL/min/{1.73_m2} — ABNORMAL LOW (ref >=60)
GLUCOSE RANDOM: 190 mg/dL — ABNORMAL HIGH (ref 70–179)
POTASSIUM: 4.3 mmol/L (ref 3.5–5.0)
PROTEIN TOTAL: 5.4 g/dL — ABNORMAL LOW (ref 6.5–8.3)
SODIUM: 136 mmol/L (ref 135–145)

## 2018-12-02 LAB — CBC W/ AUTO DIFF
BASOPHILS ABSOLUTE COUNT: 0 10*9/L (ref 0.0–0.1)
BASOPHILS RELATIVE PERCENT: 0.8 %
EOSINOPHILS ABSOLUTE COUNT: 0 10*9/L (ref 0.0–0.4)
EOSINOPHILS RELATIVE PERCENT: 0.2 %
HEMOGLOBIN: 9.2 g/dL — ABNORMAL LOW (ref 13.5–17.5)
LYMPHOCYTES ABSOLUTE COUNT: 0.3 10*9/L — ABNORMAL LOW (ref 1.5–5.0)
LYMPHOCYTES RELATIVE PERCENT: 70.1 %
MEAN CORPUSCULAR HEMOGLOBIN CONC: 34.2 g/dL (ref 31.0–37.0)
MEAN CORPUSCULAR HEMOGLOBIN: 29.6 pg (ref 26.0–34.0)
MEAN CORPUSCULAR VOLUME: 86.7 fL (ref 80.0–100.0)
MEAN PLATELET VOLUME: 10.1 fL — ABNORMAL HIGH (ref 7.0–10.0)
MONOCYTES ABSOLUTE COUNT: 0 10*9/L — ABNORMAL LOW (ref 0.2–0.8)
MONOCYTES RELATIVE PERCENT: 3.2 %
NEUTROPHILS ABSOLUTE COUNT: 0.1 10*9/L — CL (ref 2.0–7.5)
NEUTROPHILS RELATIVE PERCENT: 20.3 %
NUCLEATED RED BLOOD CELLS: 4 /100{WBCs} (ref <=4)
PLATELET COUNT: 83 10*9/L — ABNORMAL LOW (ref 150–440)
RED BLOOD CELL COUNT: 3.1 10*12/L — ABNORMAL LOW (ref 4.50–5.90)
RED CELL DISTRIBUTION WIDTH: 15 % (ref 12.0–15.0)
WBC ADJUSTED: 0.5 10*9/L — ABNORMAL LOW (ref 4.5–11.0)

## 2018-12-02 LAB — LARGE UNSTAINED CELLS: Lab: 6 — ABNORMAL HIGH

## 2018-12-02 LAB — CALCIUM: Calcium:MCnc:Pt:Ser/Plas:Qn:: 7.6 — ABNORMAL LOW

## 2018-12-02 NOTE — Unmapped (Signed)
Pt presents with no acute stress, voices no changes nor difficulties as well as no concerns since last visit. Accessed PICC.  labs drawn per treatment plan. Awaiting results before releasing medication.

## 2018-12-02 NOTE — Unmapped (Signed)
Hospital Outpatient Visit on 12/02/2018   Component Date Value Ref Range Status   ??? Sodium 12/02/2018 136  135 - 145 mmol/L Final   ??? Potassium 12/02/2018 4.3  3.5 - 5.0 mmol/L Final   ??? Chloride 12/02/2018 104  98 - 107 mmol/L Final   ??? CO2 12/02/2018 24.0  22.0 - 30.0 mmol/L Final   ??? BUN 12/02/2018 15  7 - 21 mg/dL Final   ??? Creatinine 12/02/2018 1.38* 0.70 - 1.30 mg/dL Final   ??? BUN/Creatinine Ratio 12/02/2018 11   Final   ??? EGFR CKD-EPI Non-African American,* 12/02/2018 52* >=60 mL/min/1.70m2 Final   ??? EGFR CKD-EPI African American, Male 12/02/2018 60  >=60 mL/min/1.39m2 Final   ??? Glucose 12/02/2018 190* 70 - 179 mg/dL Final   ??? Calcium 16/09/9603 7.6* 8.5 - 10.2 mg/dL Final   ??? Albumin 54/08/8118 3.0* 3.5 - 5.0 g/dL Final   ??? Total Protein 12/02/2018 5.4* 6.5 - 8.3 g/dL Final   ??? Total Bilirubin 12/02/2018 0.4  0.0 - 1.2 mg/dL Final   ??? AST 14/78/2956 14* 19 - 55 U/L Final   ??? ALT 12/02/2018 11  <50 U/L Final   ??? Alkaline Phosphatase 12/02/2018 495* 38 - 126 U/L Final   ??? Anion Gap 12/02/2018 8  7 - 15 mmol/L Final   ??? WBC 12/02/2018 0.5* 4.5 - 11.0 10*9/L Final   ??? RBC 12/02/2018 3.10* 4.50 - 5.90 10*12/L Final   ??? HGB 12/02/2018 9.2* 13.5 - 17.5 g/dL Final   ??? HCT 21/30/8657 26.9* 41.0 - 53.0 % Final   ??? MCV 12/02/2018 86.7  80.0 - 100.0 fL Final   ??? MCH 12/02/2018 29.6  26.0 - 34.0 pg Final   ??? MCHC 12/02/2018 34.2  31.0 - 37.0 g/dL Final   ??? RDW 84/69/6295 15.0  12.0 - 15.0 % Final   ??? MPV 12/02/2018 10.1* 7.0 - 10.0 fL Final   ??? Platelet 12/02/2018 83* 150 - 440 10*9/L Final   ??? Variable HGB Concentration 12/02/2018 Slight* Not Present Final   ??? Neutrophils % 12/02/2018 20.3  % Final   ??? Lymphocytes % 12/02/2018 70.1  % Final   ??? Monocytes % 12/02/2018 3.2  % Final   ??? Eosinophils % 12/02/2018 0.2  % Final   ??? Basophils % 12/02/2018 0.8  % Final   ??? Absolute Neutrophils 12/02/2018 0.1* 2.0 - 7.5 10*9/L Final   ??? Absolute Lymphocytes 12/02/2018 0.3* 1.5 - 5.0 10*9/L Final   ??? Absolute Monocytes 12/02/2018 0.0* 0.2 - 0.8 10*9/L Final   ??? Absolute Eosinophils 12/02/2018 0.0  0.0 - 0.4 10*9/L Final   ??? Absolute Basophils 12/02/2018 0.0  0.0 - 0.1 10*9/L Final   ??? Large Unstained Cells 12/02/2018 6* 0 - 4 % Final   ??? Hypochromasia 12/02/2018 Slight* Not Present Final              If you feel like this is an emergency please call 911.  For appointments or questions Monday through Friday 8AM-5PM please call [phone] or Toll Free [phone]. For Medical questions or concerns ask for the Nurse Triage Line.  On Nights, Weekends, and Holidays call [phone] and ask for the Oncologist on Call.  Reasons to call the Nurse Triage Line:  Fever of 100.5 or greater  Nausea and/or vomiting not relived with nausea medicine  Diarrhea or constipation  Severe pain not relieved with usual pain regimen  Shortness of breath  Uncontrolled bleeding  Mental status changes

## 2018-12-02 NOTE — Unmapped (Signed)
Encounter addended by: Joyce Gross on: 12/02/2018 11:36 AM   Actions taken: Result unverified, Test resulted, Final result verified

## 2018-12-02 NOTE — Unmapped (Signed)
Creatine bumped. MD notified.    No response after 20 min. Drug released. Will notify [facility].

## 2018-12-02 NOTE — Unmapped (Signed)
Encounter addended by: Lemar Livings, RN on: 12/02/2018 11:55 AM   Actions taken: Flowsheet accepted

## 2018-12-03 ENCOUNTER — Ambulatory Visit: Admit: 2018-12-03 | Discharge: 2018-12-04 | Payer: MEDICARE

## 2018-12-03 DIAGNOSIS — C9200 Acute myeloblastic leukemia, not having achieved remission: Secondary | ICD-10-CM | POA: Diagnosis not present

## 2018-12-03 DIAGNOSIS — Z5111 Encounter for antineoplastic chemotherapy: Secondary | ICD-10-CM | POA: Diagnosis not present

## 2018-12-03 NOTE — Unmapped (Signed)
No labs needed for treatment today. Request for drug sent to pharmacy.

## 2018-12-03 NOTE — Unmapped (Signed)
SQ azacitidine administered uneventfully & Pt d/c in w/c escorted by family.

## 2018-12-03 NOTE — Unmapped (Signed)
[facility] Refill and Clinical Coordination Note  Medication(s): Venclexta 100mg     PRATIK DALZIEL, DOB: Nov 07, 1951  Phone: [phone] (home) , Alternate phone contact: N/A  Shipping address: [address]  [place] [address]  Phone or address changes today?: No  All above HIPAA information verified.  Insurance changes? No    Completed refill and clinical call assessment today to schedule patient's medication shipment from the [facility] [phone]).      MEDICATION RECONCILIATION    Confirmed the medication and dosage are correct and have not changed: Yes, regimen is correct and unchanged.    Were there any changes to your medication(s) in the past month:  No, there are no changes reported at this time.    ADHERENCE    Is this medicine transplant or covered by Medicare Part B? No.    Not Applicable    Did you miss any doses in the past 4 weeks? No missed doses reported.  Adherence counseling provided? Not needed     SIDE EFFECT MANAGEMENT    Are you tolerating your medication?:  Arcenio reports tolerating the medication.  Side effect management discussed: None      Therapy is appropriate and should be continued.    Evidence of clinical benefit: Do you feel that that the medication is helping? Yes      FINANCIAL/SHIPPING    Delivery Scheduled: Yes, Expected medication delivery date: 12/07/2017     Medication will be delivered via UPS to the temporary address in [facility].    Additional medications refilled: No additional medications/refills needed at this time.    The patient will receive a drug information handout for each medication shipped and additional FDA Medication Guides as required.      Jaqwan did not have any additional questions at this time.    Delivery address confirmed in Epic.     We will follow up with patient monthly for standard refill processing and delivery.      Thank you,  Destaney Sarkis  Anders Grant   [facility] Specialty Pharmacist

## 2018-12-04 ENCOUNTER — Ambulatory Visit
Admit: 2018-12-04 | Discharge: 2018-12-05 | Payer: MEDICARE | Attending: Student in an Organized Health Care Education/Training Program | Primary: Student in an Organized Health Care Education/Training Program

## 2018-12-04 DIAGNOSIS — C9200 Acute myeloblastic leukemia, not having achieved remission: Secondary | ICD-10-CM | POA: Diagnosis not present

## 2018-12-04 DIAGNOSIS — Z1621 Resistance to vancomycin: Secondary | ICD-10-CM | POA: Diagnosis not present

## 2018-12-04 DIAGNOSIS — Z5111 Encounter for antineoplastic chemotherapy: Secondary | ICD-10-CM | POA: Diagnosis not present

## 2018-12-04 LAB — COMPREHENSIVE METABOLIC PANEL
ALBUMIN: 3 g/dL — ABNORMAL LOW (ref 3.5–5.0)
ALKALINE PHOSPHATASE: 470 U/L — ABNORMAL HIGH (ref 38–126)
ALT (SGPT): 11 U/L (ref <50)
ANION GAP: 10 mmol/L (ref 7–15)
AST (SGOT): 15 U/L — ABNORMAL LOW (ref 19–55)
BILIRUBIN TOTAL: 0.5 mg/dL (ref 0.0–1.2)
BUN / CREAT RATIO: 11
CALCIUM: 7.9 mg/dL — ABNORMAL LOW (ref 8.5–10.2)
CHLORIDE: 103 mmol/L (ref 98–107)
CO2: 24 mmol/L (ref 22.0–30.0)
CREATININE: 1.71 mg/dL — ABNORMAL HIGH (ref 0.70–1.30)
EGFR CKD-EPI NON-AA MALE: 40 mL/min/{1.73_m2} — ABNORMAL LOW (ref >=60)
GLUCOSE RANDOM: 201 mg/dL — ABNORMAL HIGH (ref 70–179)
POTASSIUM: 4.8 mmol/L (ref 3.5–5.0)
PROTEIN TOTAL: 5.7 g/dL — ABNORMAL LOW (ref 6.5–8.3)
SODIUM: 137 mmol/L (ref 135–145)

## 2018-12-04 LAB — CBC W/ AUTO DIFF
BASOPHILS ABSOLUTE COUNT: 0 10*9/L (ref 0.0–0.1)
BASOPHILS RELATIVE PERCENT: 0.8 %
EOSINOPHILS ABSOLUTE COUNT: 0 10*9/L (ref 0.0–0.4)
HEMATOCRIT: 24.6 % — ABNORMAL LOW (ref 41.0–53.0)
HEMOGLOBIN: 8.4 g/dL — ABNORMAL LOW (ref 13.5–17.5)
LARGE UNSTAINED CELLS: 5 % — ABNORMAL HIGH (ref 0–4)
LYMPHOCYTES ABSOLUTE COUNT: 0.4 10*9/L — ABNORMAL LOW (ref 1.5–5.0)
LYMPHOCYTES RELATIVE PERCENT: 74.9 %
MEAN CORPUSCULAR HEMOGLOBIN: 29.4 pg (ref 26.0–34.0)
MEAN CORPUSCULAR VOLUME: 86.1 fL (ref 80.0–100.0)
MEAN PLATELET VOLUME: 11.2 fL — ABNORMAL HIGH (ref 7.0–10.0)
MONOCYTES ABSOLUTE COUNT: 0 10*9/L — ABNORMAL LOW (ref 0.2–0.8)
MONOCYTES RELATIVE PERCENT: 2.3 %
NEUTROPHILS ABSOLUTE COUNT: 0.1 10*9/L — CL (ref 2.0–7.5)
NEUTROPHILS RELATIVE PERCENT: 15.9 %
PLATELET COUNT: 71 10*9/L — ABNORMAL LOW (ref 150–440)
RED BLOOD CELL COUNT: 2.85 10*12/L — ABNORMAL LOW (ref 4.50–5.90)
RED CELL DISTRIBUTION WIDTH: 15.3 % — ABNORMAL HIGH (ref 12.0–15.0)
WBC ADJUSTED: 0.6 10*9/L — ABNORMAL LOW (ref 4.5–11.0)

## 2018-12-04 LAB — SMEAR REVIEW

## 2018-12-04 LAB — MONOCYTES ABSOLUTE COUNT: Lab: 0 — ABNORMAL LOW

## 2018-12-04 LAB — CREATININE: Creatinine:MCnc:Pt:Ser/Plas:Qn:: 1.71 — ABNORMAL HIGH

## 2018-12-04 MED ORDER — LEVOFLOXACIN 250 MG TABLET
ORAL_TABLET | Freq: Every day | ORAL | 3 refills | 0 days | Status: CP
Start: 2018-12-04 — End: 2019-01-15

## 2018-12-04 NOTE — Unmapped (Signed)
Patient is here for C1D7 of Aza. His labs were drawn. His Hgb is 8.4, platelets are 71, and hct is 24.6. Patient is asymptomatic and does not need a blood transfusion. Montel Culver was paged and confirmed he doesn't need a transfusion. His Cr and liver function labs are elevated. Bethena Roys was paged and ordered IVF and gave the ok to proceed with treatment. He tolerated todays injection without complications. His abdomen is red and has some blistering from previous injections. Patient is applying ice at home as needed. He left ambulatory with no needs

## 2018-12-04 NOTE — Unmapped (Signed)
If you feel like this is an emergency please call 911.  For appointments or questions Monday through Friday 8AM-5PM please call [phone] or Toll Free [phone]. For Medical questions or concerns ask for the Nurse Triage Line.  On Nights, Weekends, and Holidays call [phone] and ask for the Oncologist on Call.  Reasons to call the Nurse Triage Line:  Fever of 100.5 or greater  Nausea and/or vomiting not relived with nausea medicine  Diarrhea or constipation  Severe pain not relieved with usual pain regimen  Shortness of breath  Uncontrolled bleeding  Mental status changes    Lab Results   Component Value Date    WBC 0.6 (L) 12/04/2018    HGB 8.4 (L) 12/04/2018    HCT 24.6 (L) 12/04/2018    PLT 71 (L) 12/04/2018       Lab Results   Component Value Date    NA 137 12/04/2018    K 4.8 12/04/2018    CL 103 12/04/2018    CO2 24.0 12/04/2018    BUN 19 12/04/2018    CREATININE 1.71 (H) 12/04/2018    GLU 201 (H) 12/04/2018    CALCIUM 7.9 (L) 12/04/2018    MG 1.8 10/31/2018    PHOS 3.9 11/12/2018       Lab Results   Component Value Date    BILITOT 0.5 12/04/2018    PROT 5.7 (L) 12/04/2018    ALBUMIN 3.0 (L) 12/04/2018    ALT 11 12/04/2018    AST 15 (L) 12/04/2018    ALKPHOS 470 (H) 12/04/2018    GGT 82 10/30/2018       Lab Results   Component Value Date    INR 1.27 11/12/2018    APTT 29.3 11/12/2018

## 2018-12-04 NOTE — Unmapped (Signed)
After discussion with Dr. Vertell Limber and Dr. Reynold Bowen, we will STOP IV vancomycin and ceftriaxone as his ECHO shows no vegetation. I communicated this with the pharmacist who has been working with this facility. As his ANC is 0.1, he should be started on levofloxacin prophylaxis. I recommend a dose of 250 mg daily due to his renal function. I printed this prescription and faxed it to the facility at [phone]. He should continue on IV micafungin for prophylaxis until his posaconazole is approved. At which point he will need to dose reduce the venetoclax to 100 mg daily.       Manfred Arch, PharmD, BCOP, CPP  Pager: [phone]

## 2018-12-06 ENCOUNTER — Other Ambulatory Visit: Admit: 2018-12-06 | Discharge: 2018-12-07 | Payer: MEDICARE

## 2018-12-06 ENCOUNTER — Ambulatory Visit: Admit: 2018-12-06 | Discharge: 2018-12-07 | Payer: MEDICARE

## 2018-12-06 ENCOUNTER — Other Ambulatory Visit: Payer: Self-pay | Admitting: Oncology

## 2018-12-06 DIAGNOSIS — C9200 Acute myeloblastic leukemia, not having achieved remission: Secondary | ICD-10-CM | POA: Insufficient documentation

## 2018-12-06 LAB — CBC W/ AUTO DIFF
BASOPHILS ABSOLUTE COUNT: 0 10*9/L (ref 0.0–0.1)
BASOPHILS RELATIVE PERCENT: 0.4 %
EOSINOPHILS ABSOLUTE COUNT: 0 10*9/L (ref 0.0–0.4)
EOSINOPHILS RELATIVE PERCENT: 0.2 %
HEMATOCRIT: 24.8 % — ABNORMAL LOW (ref 41.0–53.0)
HEMOGLOBIN: 8.5 g/dL — ABNORMAL LOW (ref 13.5–17.5)
LARGE UNSTAINED CELLS: 4 % (ref 0–4)
LYMPHOCYTES ABSOLUTE COUNT: 0.5 10*9/L — ABNORMAL LOW (ref 1.5–5.0)
LYMPHOCYTES RELATIVE PERCENT: 84.9 %
MEAN CORPUSCULAR HEMOGLOBIN CONC: 34.1 g/dL (ref 31.0–37.0)
MEAN CORPUSCULAR HEMOGLOBIN: 29.2 pg (ref 26.0–34.0)
MEAN CORPUSCULAR VOLUME: 85.7 fL (ref 80.0–100.0)
MEAN PLATELET VOLUME: 10.4 fL — ABNORMAL HIGH (ref 7.0–10.0)
MONOCYTES ABSOLUTE COUNT: 0 10*9/L — ABNORMAL LOW (ref 0.2–0.8)
NEUTROPHILS ABSOLUTE COUNT: 0.1 10*9/L — CL (ref 2.0–7.5)
NEUTROPHILS RELATIVE PERCENT: 10 %
RED BLOOD CELL COUNT: 2.9 10*12/L — ABNORMAL LOW (ref 4.50–5.90)
RED CELL DISTRIBUTION WIDTH: 14.7 % (ref 12.0–15.0)
WBC ADJUSTED: 0.6 10*9/L — ABNORMAL LOW (ref 4.5–11.0)

## 2018-12-06 LAB — COMPREHENSIVE METABOLIC PANEL
ALBUMIN: 3.2 g/dL — ABNORMAL LOW (ref 3.5–5.0)
ALKALINE PHOSPHATASE: 484 U/L — ABNORMAL HIGH (ref 38–126)
ANION GAP: 9 mmol/L (ref 7–15)
AST (SGOT): 15 U/L — ABNORMAL LOW (ref 19–55)
BILIRUBIN TOTAL: 0.4 mg/dL (ref 0.0–1.2)
BUN / CREAT RATIO: 10
CALCIUM: 8 mg/dL — ABNORMAL LOW (ref 8.5–10.2)
CHLORIDE: 103 mmol/L (ref 98–107)
CO2: 23 mmol/L (ref 22.0–30.0)
EGFR CKD-EPI AA MALE: 54 mL/min/{1.73_m2} — ABNORMAL LOW (ref >=60)
EGFR CKD-EPI NON-AA MALE: 47 mL/min/{1.73_m2} — ABNORMAL LOW (ref >=60)
GLUCOSE RANDOM: 217 mg/dL — ABNORMAL HIGH (ref 70–179)
POTASSIUM: 4.4 mmol/L (ref 3.5–5.0)
PROTEIN TOTAL: 5.8 g/dL — ABNORMAL LOW (ref 6.5–8.3)
SODIUM: 135 mmol/L (ref 135–145)

## 2018-12-06 LAB — EGFR CKD-EPI AA MALE: Lab: 54 — ABNORMAL LOW

## 2018-12-06 LAB — MONOCYTES RELATIVE PERCENT: Lab: 0.9

## 2018-12-06 MED ORDER — ONDANSETRON HCL 8 MG PO TABS: 8.0000 mg | ORAL_TABLET | Freq: Two times a day (BID) | ORAL | 2 refills | Status: AC | PRN

## 2018-12-06 MED ORDER — PROCHLORPERAZINE MALEATE 10 MG PO TABS: 10.0000 mg | ORAL_TABLET | Freq: Four times a day (QID) | ORAL | 2 refills | Status: DC | PRN

## 2018-12-06 MED FILL — VENCLEXTA 100 MG TABLET: ORAL | 30 days supply | Qty: 120 | Fill #1

## 2018-12-06 MED FILL — VENCLEXTA 100 MG TABLET: 30 days supply | Qty: 120 | Fill #1 | Status: AC

## 2018-12-06 NOTE — Progress Notes (Signed)
DISCONTINUE OFF PATHWAY REGIMEN - [Other Dx]   [id number]:Azacitidine 75 mg/m2 IV D1-5 q28 days:   A cycle is every 28 days:     Azacitidine   **Always confirm dose/schedule in your pharmacy ordering system**  REASON: Other Reason PRIOR TREATMENT: Azacitidine 75 mg/m2 IV D1-5 q28 days  START OFF PATHWAY REGIMEN - [Other Dx]   [id number]:Azacitidine 75 mg/m2 subcut D1-5 q28 Days:   A cycle is every 28 days:     Azacitidine   **Always confirm dose/schedule in your pharmacy ordering system**  Patient Characteristics: Intent of Therapy: Non-Curative / Palliative Intent, Discussed with Patient

## 2018-12-06 NOTE — Progress Notes (Signed)
START OFF PATHWAY REGIMEN - [Other Dx]   [id number]:Azacitidine 75 mg/m2 IV D1-5 q28 days:   A cycle is every 28 days:     Azacitidine   **Always confirm dose/schedule in your pharmacy ordering system**  Patient Characteristics: Intent of Therapy: Non-Curative / Palliative Intent, Discussed with Patient

## 2018-12-06 NOTE — Unmapped (Signed)
Labs drawn and sent for analysis.  Care provided by  Merceda Elks, RN

## 2018-12-06 NOTE — Unmapped (Signed)
Lab on 12/06/2018   Component Date Value Ref Range Status   ??? Sodium 12/06/2018 135  135 - 145 mmol/L Final   ??? Potassium 12/06/2018 4.4  3.5 - 5.0 mmol/L Final   ??? Chloride 12/06/2018 103  98 - 107 mmol/L Final   ??? CO2 12/06/2018 23.0  22.0 - 30.0 mmol/L Final   ??? BUN 12/06/2018 15  7 - 21 mg/dL Final   ??? Creatinine 12/06/2018 1.51* 0.70 - 1.30 mg/dL Final   ??? BUN/Creatinine Ratio 12/06/2018 10   Final   ??? EGFR CKD-EPI Non-African American,* 12/06/2018 47* >=60 mL/min/1.86m2 Final   ??? EGFR CKD-EPI African American, Male 12/06/2018 54* >=60 mL/min/1.35m2 Final   ??? Glucose 12/06/2018 217* 70 - 179 mg/dL Final   ??? Calcium 16/09/9603 8.0* 8.5 - 10.2 mg/dL Final   ??? Albumin 54/08/8118 3.2* 3.5 - 5.0 g/dL Final   ??? Total Protein 12/06/2018 5.8* 6.5 - 8.3 g/dL Final   ??? Total Bilirubin 12/06/2018 0.4  0.0 - 1.2 mg/dL Final   ??? AST 14/78/2956 15* 19 - 55 U/L Final   ??? ALT 12/06/2018 12  <50 U/L Final   ??? Alkaline Phosphatase 12/06/2018 484* 38 - 126 U/L Final   ??? Anion Gap 12/06/2018 9  7 - 15 mmol/L Final   ??? WBC 12/06/2018 0.6* 4.5 - 11.0 10*9/L Final   ??? RBC 12/06/2018 2.90* 4.50 - 5.90 10*12/L Final   ??? HGB 12/06/2018 8.5* 13.5 - 17.5 g/dL Final   ??? HCT 21/30/8657 24.8* 41.0 - 53.0 % Final   ??? MCV 12/06/2018 85.7  80.0 - 100.0 fL Final   ??? MCH 12/06/2018 29.2  26.0 - 34.0 pg Final   ??? MCHC 12/06/2018 34.1  31.0 - 37.0 g/dL Final   ??? RDW 84/69/6295 14.7  12.0 - 15.0 % Final   ??? MPV 12/06/2018 10.4* 7.0 - 10.0 fL Final   ??? Platelet 12/06/2018 65* 150 - 440 10*9/L Final   ??? Variable HGB Concentration 12/06/2018 Slight* Not Present Final   ??? Neutrophils % 12/06/2018 10.0  % Final   ??? Lymphocytes % 12/06/2018 84.9  % Final   ??? Monocytes % 12/06/2018 0.9  % Final   ??? Eosinophils % 12/06/2018 0.2  % Final   ??? Basophils % 12/06/2018 0.4  % Final   ??? Absolute Neutrophils 12/06/2018 0.1* 2.0 - 7.5 10*9/L Final   ??? Absolute Lymphocytes 12/06/2018 0.5* 1.5 - 5.0 10*9/L Final   ??? Absolute Monocytes 12/06/2018 0.0* 0.2 - 0.8 10*9/L Final   ??? Absolute Eosinophils 12/06/2018 0.0  0.0 - 0.4 10*9/L Final   ??? Absolute Basophils 12/06/2018 0.0  0.0 - 0.1 10*9/L Final   ??? Large Unstained Cells 12/06/2018 4  0 - 4 % Final     If you feel like this is an emergency please call 911.  For appointments or questions Monday through Friday 8AM-5PM please call [phone] or Toll Free [phone]. For Medical questions or concerns ask for the Nurse Triage Line.  On Nights, Weekends, and Holidays call [phone] and ask for the Oncologist on Call.  Reasons to call the Nurse Triage Line:  Fever of 100.5 or greater  Nausea and/or vomiting not relived with nausea medicine  Diarrhea or constipation  Severe pain not relieved with usual pain regimen  Shortness of breath  Uncontrolled bleeding  Mental status changes

## 2018-12-06 NOTE — Unmapped (Signed)
Reviewed lab results with patient and caregiver, no additional concerns identified. Patient discharged with caregiver via personal wheelchair.

## 2018-12-07 NOTE — Progress Notes (Deleted)
[place]  [phone]) [phone] [phone]) [phone]  ID: Richard Gordon OB: 08-09-1951  MR#: [id number]  QIO#:[id number]  Patient Care Team: Leone Haven, MD as PCP - General (Family Medicine)  CHIEF COMPLAINT: AML  INTERVAL HISTORY: Patient is a 68 year old male who was last evaluated nearly 6 months ago for thrombocytopenia who was recently admitted to [facility] and diagnosed with blastic plasmacytoid dendritic cell neoplasm.  He received a 7-day course of subcutaneous Vidaza while in the hospital at the end of November and is currently taking 400 mg venetoclax daily.  He has persistent weakness and fatigue.  He has no neurologic complaints.  He denies any rashes.  He has a fair appetite, but denies weight loss.  He has no chest pain or shortness of breath.  He denies any nausea, vomiting, constipation, or diarrhea.  He has no urinary complaints.  Patient otherwise feels well and offers no further specific complaints today.    REVIEW OF SYSTEMS:   Review of Systems  Constitutional: Negative.  Negative for fever, malaise/fatigue and weight loss.  Respiratory: Negative.  Negative for cough and shortness of breath.   Cardiovascular: Negative.  Negative for chest pain and leg swelling.  Gastrointestinal: Negative.  Negative for abdominal pain and constipation.  Genitourinary: Negative.  Negative for dysuria.  Musculoskeletal: Negative.   Skin: Negative.  Negative for rash.  Neurological: Negative.  Negative for sensory change, focal weakness and weakness.  Psychiatric/Behavioral: Negative.  The patient is not nervous/anxious.    As per HPI. Otherwise, a complete review of systems is negative.  PAST MEDICAL HISTORY: Past Medical History:  Diagnosis Date  . Chronic kidney disease (CKD), stage III (moderate) ([place]) 11/03/2015  . Diabetes mellitus without complication ([place])   . Hyperlipidemia   . Hypertension   . Stroke [facility])    per pt 2 CVA in 2000,and 1998 (  R  ,then L side effected seperately per pt )    PAST SURGICAL HISTORY: Past Surgical History:  Procedure Laterality Date  . CATARACT EXTRACTION    . TEE WITHOUT CARDIOVERSION N/A 10/18/2018   Procedure: TRANSESOPHAGEAL ECHOCARDIOGRAM (TEE);  Surgeon: Teodoro Spray, MD;  Location: [facility] ORS;  Service: Cardiovascular;  Laterality: N/A;  . TONSILLECTOMY      FAMILY HISTORY: Family History  Problem Relation Age of Onset  . Hypertension Father   . Diabetes Father   . Brain cancer Mother        per pt he was 59 and thinks this was cancer   . Coronary artery disease Sister        scarlet fever as child affected heart per pt    ADVANCED DIRECTIVES (Y/N):  N  HEALTH MAINTENANCE: Social History   Tobacco Use  . Smoking status: Former Smoker    Packs/day: 0.50    Years: 25.00    Pack years: 12.50    Types: Cigarettes    Last attempt to quit: 04/04/1999    Years since quitting: 19.6  . Smokeless tobacco: Former Systems developer    Types: Snuff  Substance Use Topics  . Alcohol use: No    Alcohol/week: 0.0 standard drinks  . Drug use: No     Colonoscopy:  PAP:  Bone density:  Lipid panel:  No Known Allergies  Current Outpatient Medications  Medication Sig Dispense Refill  . amLODipine (NORVASC) 10 MG tablet Take 1 tablet by mouth daily.    . insulin glargine (LANTUS) 100 UNIT/ML injection Inject 15 Units into the skin at  bedtime.    . insulin lispro (HUMALOG) 100 UNIT/ML injection Inject into the skin.    . micafungin (MYCAMINE) 50 MG injection Inject into the vein daily. in sodium chloride 0.9 % 100 mL IVPB    . mirtazapine (REMERON) 7.5 MG tablet Take 1 tablet by mouth at bedtime.    Marland Kitchen OLANZapine zydis (ZYPREXA) 5 MG disintegrating tablet Take 1 tablet by mouth at bedtime.    . ondansetron (ZOFRAN) 8 MG tablet Take 1 tablet (8 mg total) by mouth 2 (two) times daily as needed (Nausea or vomiting). 30 tablet 2  . ondansetron (ZOFRAN-ODT) 4 MG disintegrating tablet Take 1 tablet by  mouth every 8 (eight) hours as needed for nausea/vomiting.    . pravastatin (PRAVACHOL) 10 MG tablet Take 1 tablet by mouth daily.    . prochlorperazine (COMPAZINE) 10 MG tablet Take 1 tablet (10 mg total) by mouth every 6 (six) hours as needed (Nausea or vomiting). 60 tablet 2  . valACYclovir (VALTREX) 500 MG tablet Take 1 tablet by mouth daily.    Marland Kitchen venetoclax 100 MG TABS Take 4 tablets by mouth daily.     No current facility-administered medications for this visit.     OBJECTIVE: There were no vitals filed for this visit.   There is no height or weight on file to calculate BMI.    ECOG FS:2 - Symptomatic, <50% confined to bed  General: Well-developed, well-nourished, no acute distress. Eyes: Pink conjunctiva, anicteric sclera. HEENT: Normocephalic, moist mucous membranes, clear oropharnyx. Lungs: Clear to auscultation bilaterally. Heart: Regular rate and rhythm. No rubs, murmurs, or gallops. Abdomen: Soft, nontender, nondistended. No organomegaly noted, normoactive bowel sounds. Musculoskeletal: No edema, cyanosis, or clubbing. Neuro: Alert, answering all questions appropriately. Cranial nerves grossly intact. Skin: No rashes or petechiae noted. Psych: Normal affect. Lymphatics: No cervical, calvicular, axillary or inguinal LAD.  LAB RESULTS:  Lab Results  Component Value Date   NA 136 11/15/2018   K 4.3 11/15/2018   CL 105 11/15/2018   CO2 22 11/15/2018   GLUCOSE 203 (H) 11/15/2018   BUN 15 11/15/2018   CREATININE 1.57 (H) 11/15/2018   CALCIUM 8.4 (L) 11/15/2018   PROT 6.3 (L) 11/15/2018   ALBUMIN 2.8 (L) 11/15/2018   AST 24 11/15/2018   ALT 24 11/15/2018   ALKPHOS 498 (H) 11/15/2018   BILITOT 0.9 11/15/2018   GFRNONAA 45 (L) 11/15/2018   GFRAA 52 (L) 11/15/2018    Lab Results  Component Value Date   WBC 0.6 (LL) 11/15/2018   NEUTROABS 0.1 (L) 11/15/2018   HGB 7.7 (L) 11/15/2018   HCT 23.1 (L) 11/15/2018   MCV 84.9 11/15/2018   PLT 38 (L) 11/15/2018      STUDIES: No results found.  ASSESSMENT: AML  PLAN:    1. AML: Bone marrow biopsy was completed on October 23, 2018.  Patient was subsequently treated at [facility] with 7 days of subcutaneous Vidaza.  He is currently on 400 mg daily of venetoclax.  Patient has follow-up at [facility] in approximately 1-1/2 weeks which he thinks is for his next treatment, but this is unclear.  Attempts to reach his primary hematologist at [facility] have been unsuccessful up to this point.  Continue supportive care at this time.  Will determine follow-up once case is discussed with his primary hematologist. 2.  Pancytopenia: Secondary to Blastic plasmacytoid dendritic cell neoplasm.  We will proceed with 1 unit of packed red blood cells today.  Patient does not require platelet transfusion.  Will  irradiate all blood products at this time. 3.  Neutropenia: Likely multifactorial including infiltrative malignancy as well as treatment with venetoclax and Vidaza.  I spent a total of 60 minutes face-to-face with the patient of which greater than 50% of the visit was spent in counseling and coordination of care as detailed above.   Patient expressed understanding and was in agreement with this plan. He also understands that He can call clinic at any time with any questions, concerns, or complaints.    Lloyd Huger, MD   12/07/2018 1:28 PM

## 2018-12-10 ENCOUNTER — Ambulatory Visit: Admit: 2018-12-10 | Discharge: 2018-12-11 | Payer: MEDICARE

## 2018-12-10 ENCOUNTER — Other Ambulatory Visit: Admit: 2018-12-10 | Discharge: 2018-12-11 | Payer: MEDICARE

## 2018-12-10 ENCOUNTER — Inpatient Hospital Stay: Payer: Medicare HMO

## 2018-12-10 ENCOUNTER — Encounter: Payer: Self-pay | Admitting: Oncology

## 2018-12-10 ENCOUNTER — Inpatient Hospital Stay: Payer: Medicare HMO | Admitting: Oncology

## 2018-12-10 DIAGNOSIS — C9200 Acute myeloblastic leukemia, not having achieved remission: Secondary | ICD-10-CM | POA: Diagnosis not present

## 2018-12-10 LAB — MEAN PLATELET VOLUME: Lab: 9.8

## 2018-12-10 LAB — CBC W/ AUTO DIFF
EOSINOPHILS RELATIVE PERCENT: 0.6 %
HEMATOCRIT: 19.9 % — ABNORMAL LOW (ref 41.0–53.0)
HEMOGLOBIN: 7.1 g/dL — ABNORMAL LOW (ref 13.5–17.5)
LARGE UNSTAINED CELLS: 4 % (ref 0–4)
LYMPHOCYTES ABSOLUTE COUNT: 0.5 10*9/L — ABNORMAL LOW (ref 1.5–5.0)
LYMPHOCYTES RELATIVE PERCENT: 81.4 %
MEAN CORPUSCULAR HEMOGLOBIN CONC: 35.5 g/dL (ref 31.0–37.0)
MEAN CORPUSCULAR HEMOGLOBIN: 29.6 pg (ref 26.0–34.0)
MEAN CORPUSCULAR VOLUME: 83.4 fL (ref 80.0–100.0)
MEAN PLATELET VOLUME: 9.8 fL (ref 7.0–10.0)
MONOCYTES ABSOLUTE COUNT: 0 10*9/L — ABNORMAL LOW (ref 0.2–0.8)
MONOCYTES RELATIVE PERCENT: 2.2 %
NEUTROPHILS ABSOLUTE COUNT: 0.1 10*9/L — CL (ref 2.0–7.5)
NEUTROPHILS RELATIVE PERCENT: 11.4 %
PLATELET COUNT: 31 10*9/L — ABNORMAL LOW (ref 150–440)
RED BLOOD CELL COUNT: 2.39 10*12/L — ABNORMAL LOW (ref 4.50–5.90)
RED CELL DISTRIBUTION WIDTH: 14.3 % (ref 12.0–15.0)
WBC ADJUSTED: 0.6 10*9/L — ABNORMAL LOW (ref 4.5–11.0)

## 2018-12-10 LAB — COMPREHENSIVE METABOLIC PANEL
ALKALINE PHOSPHATASE: 461 U/L — ABNORMAL HIGH (ref 38–126)
ANION GAP: 15 mmol/L (ref 7–15)
AST (SGOT): 14 U/L — ABNORMAL LOW (ref 19–55)
BILIRUBIN TOTAL: 0.5 mg/dL (ref 0.0–1.2)
BLOOD UREA NITROGEN: 14 mg/dL (ref 7–21)
BUN / CREAT RATIO: 11
CALCIUM: 8 mg/dL — ABNORMAL LOW (ref 8.5–10.2)
CHLORIDE: 104 mmol/L (ref 98–107)
CO2: 18 mmol/L — ABNORMAL LOW (ref 22.0–30.0)
CREATININE: 1.26 mg/dL (ref 0.70–1.30)
EGFR CKD-EPI AA MALE: 67 mL/min/{1.73_m2} (ref >=60)
EGFR CKD-EPI NON-AA MALE: 58 mL/min/{1.73_m2} — ABNORMAL LOW (ref >=60)
GLUCOSE RANDOM: 207 mg/dL — ABNORMAL HIGH (ref 70–179)
POTASSIUM: 4 mmol/L (ref 3.5–5.0)
PROTEIN TOTAL: 5.9 g/dL — ABNORMAL LOW (ref 6.5–8.3)
SODIUM: 137 mmol/L (ref 135–145)

## 2018-12-10 LAB — BUN / CREAT RATIO: Urea nitrogen/Creatinine:MRto:Pt:Ser/Plas:Qn:: 11

## 2018-12-10 LAB — SMEAR REVIEW

## 2018-12-10 NOTE — Unmapped (Signed)
1330: PICC with gauze over securing device. No CHG visible. Infusion Charge P Morfeld aware.

## 2018-12-11 ENCOUNTER — Ambulatory Visit: Admit: 2018-12-11 | Discharge: 2018-12-11 | Payer: MEDICARE

## 2018-12-11 ENCOUNTER — Other Ambulatory Visit: Admit: 2018-12-11 | Discharge: 2018-12-11 | Payer: MEDICARE

## 2018-12-11 ENCOUNTER — Inpatient Hospital Stay: Payer: Medicare HMO

## 2018-12-11 DIAGNOSIS — C9200 Acute myeloblastic leukemia, not having achieved remission: Secondary | ICD-10-CM | POA: Diagnosis not present

## 2018-12-11 LAB — CBC W/ AUTO DIFF
BASOPHILS RELATIVE PERCENT: 0.4 %
EOSINOPHILS ABSOLUTE COUNT: 0 10*9/L (ref 0.0–0.4)
EOSINOPHILS RELATIVE PERCENT: 0.9 %
LARGE UNSTAINED CELLS: 3 % (ref 0–4)
LYMPHOCYTES ABSOLUTE COUNT: 0.4 10*9/L — ABNORMAL LOW (ref 1.5–5.0)
LYMPHOCYTES RELATIVE PERCENT: 84.7 %
MEAN CORPUSCULAR HEMOGLOBIN CONC: 35.4 g/dL (ref 31.0–37.0)
MEAN CORPUSCULAR HEMOGLOBIN: 30 pg (ref 26.0–34.0)
MEAN CORPUSCULAR VOLUME: 84.5 fL (ref 80.0–100.0)
MONOCYTES ABSOLUTE COUNT: 0 10*9/L — ABNORMAL LOW (ref 0.2–0.8)
MONOCYTES RELATIVE PERCENT: 2.6 %
NEUTROPHILS ABSOLUTE COUNT: 0.1 10*9/L — CL (ref 2.0–7.5)
NEUTROPHILS RELATIVE PERCENT: 8.8 %
PLATELET COUNT: 22 10*9/L — ABNORMAL LOW (ref 150–440)
RED BLOOD CELL COUNT: 3.26 10*12/L — ABNORMAL LOW (ref 4.50–5.90)
RED CELL DISTRIBUTION WIDTH: 14.9 % (ref 12.0–15.0)
WBC ADJUSTED: 0.5 10*9/L — ABNORMAL LOW (ref 4.5–11.0)

## 2018-12-11 LAB — PLATELET COUNT: Lab: 41 — ABNORMAL LOW

## 2018-12-11 LAB — LYMPHOCYTES RELATIVE PERCENT: Lab: 84.7

## 2018-12-11 NOTE — Unmapped (Signed)
Seated in chr 14, premeds given, awaiting prep of platelets by blood bank.

## 2018-12-11 NOTE — Unmapped (Signed)
If you feel like this is an emergency please call 911.  For appointments or questions Monday through Friday 8AM-5PM please call [phone] or Toll Free [phone]. For Medical questions or concerns ask for the Nurse Triage Line.  On Nights, Weekends, and Holidays call [phone] and ask for the Oncologist on Call.  Reasons to call the Nurse Triage Line:  Fever of 100.5 or greater  Nausea and/or vomiting not relived with nausea medicine  Diarrhea or constipation  Severe pain not relieved with usual pain regimen  Shortness of breath  Uncontrolled bleeding  Mental status changes    Lab on 12/10/2018   Component Date Value Ref Range Status   ??? Antibody Screen 12/10/2018 NEG   Final   ??? ABO Grouping 12/10/2018 A POS   Final   ??? Sodium 12/10/2018 137  135 - 145 mmol/L Final   ??? Potassium 12/10/2018 4.0  3.5 - 5.0 mmol/L Final   ??? Chloride 12/10/2018 104  98 - 107 mmol/L Final   ??? CO2 12/10/2018 18.0* 22.0 - 30.0 mmol/L Final   ??? BUN 12/10/2018 14  7 - 21 mg/dL Final   ??? Creatinine 12/10/2018 1.26  0.70 - 1.30 mg/dL Final   ??? BUN/Creatinine Ratio 12/10/2018 11   Final   ??? EGFR CKD-EPI Non-African American,* 12/10/2018 58* >=60 mL/min/1.71m2 Final   ??? EGFR CKD-EPI African American, Male 12/10/2018 67  >=60 mL/min/1.38m2 Final   ??? Glucose 12/10/2018 207* 70 - 179 mg/dL Final   ??? Calcium 41/32/4401 8.0* 8.5 - 10.2 mg/dL Final   ??? Albumin 02/72/5366 3.2* 3.5 - 5.0 g/dL Final   ??? Total Protein 12/10/2018 5.9* 6.5 - 8.3 g/dL Final   ??? Total Bilirubin 12/10/2018 0.5  0.0 - 1.2 mg/dL Final   ??? AST 44/02/4741 14* 19 - 55 U/L Final   ??? ALT 12/10/2018 11  <50 U/L Final   ??? Alkaline Phosphatase 12/10/2018 461* 38 - 126 U/L Final   ??? Anion Gap 12/10/2018 15  7 - 15 mmol/L Final   ??? WBC 12/10/2018 0.6* 4.5 - 11.0 10*9/L Final   ??? RBC 12/10/2018 2.39* 4.50 - 5.90 10*12/L Final   ??? HGB 12/10/2018 7.1* 13.5 - 17.5 g/dL Final   ??? HCT 59/56/3875 19.9* 41.0 - 53.0 % Final   ??? MCV 12/10/2018 83.4  80.0 - 100.0 fL Final   ??? MCH 12/10/2018 29.6  26.0 - 34.0 pg Final   ??? MCHC 12/10/2018 35.5  31.0 - 37.0 g/dL Final   ??? RDW 64/33/2951 14.3  12.0 - 15.0 % Final   ??? MPV 12/10/2018 9.8  7.0 - 10.0 fL Final   ??? Platelet 12/10/2018 31* 150 - 440 10*9/L Final   ??? Variable HGB Concentration 12/10/2018 Slight* Not Present Final   ??? Neutrophils % 12/10/2018 11.4  % Final   ??? Lymphocytes % 12/10/2018 81.4  % Final   ??? Monocytes % 12/10/2018 2.2  % Final   ??? Eosinophils % 12/10/2018 0.6  % Final   ??? Basophils % 12/10/2018 0.2  % Final   ??? Absolute Neutrophils 12/10/2018 0.1* 2.0 - 7.5 10*9/L Final   ??? Absolute Lymphocytes 12/10/2018 0.5* 1.5 - 5.0 10*9/L Final   ??? Absolute Monocytes 12/10/2018 0.0* 0.2 - 0.8 10*9/L Final   ??? Absolute Eosinophils 12/10/2018 0.0  0.0 - 0.4 10*9/L Final   ??? Absolute Basophils 12/10/2018 0.0  0.0 - 0.1 10*9/L Final   ??? Large Unstained Cells 12/10/2018 4  0 - 4 % Final   ???  Hyperchromasia 12/10/2018 Slight* Not Present Final   ??? Smear Review Comments 12/10/2018 See Comment* Undefined Final    Slide reviewed     Hospital Outpatient Visit on 12/10/2018   Component Date Value Ref Range Status   ??? Crossmatch 12/10/2018 Compatible   Final   ??? Unit Blood Type 12/10/2018 A Pos   Final   ??? ISBT Number 12/10/2018 6200   Final   ??? Unit # 12/10/2018 [id number]   Final   ??? Status 12/10/2018 Issued   Final   ??? Spec Expiration 12/10/2018 [id number]   Final   ??? Product ID 12/10/2018 Red Blood Cells   Final   ??? PRODUCT CODE 12/10/2018 [id number]   Final   ??? Crossmatch 12/10/2018 Compatible   Final   ??? Unit Blood Type 12/10/2018 A Pos   Final   ??? ISBT Number 12/10/2018 6200   Final   ??? Unit # 12/10/2018 [id number]   Final   ??? Status 12/10/2018 Issued   Final   ??? Spec Expiration 12/10/2018 [id number]   Final   ??? Product ID 12/10/2018 Red Blood Cells   Final   ??? PRODUCT CODE 12/10/2018 [id number]   Final

## 2018-12-11 NOTE — Unmapped (Signed)
Administered x2 units of PRBC uneventfully via PICC & flushed followed by heparinizing.  Pt d/c in w/c escorted by family.

## 2018-12-11 NOTE — Unmapped (Signed)
Blood drawn from RUE PICC, sent to lab.  To next appt.

## 2018-12-12 ENCOUNTER — Ambulatory Visit: Payer: Medicare HMO

## 2018-12-12 NOTE — Unmapped (Signed)
Procedure not performed as platelets only rose to 41 after 1 unit.    Mariana Kaufman, AGPCNP-BC  Nurse Practitioner  Hematology/Oncology  [facility]  12/11/2018

## 2018-12-12 NOTE — Unmapped (Signed)
If you feel like this is an emergency please call 911.  For appointments or questions Monday through Friday 8AM-5PM please call [phone] or Toll Free [phone]. For Medical questions or concerns ask for the Nurse Triage Line.  On Nights, Weekends, and Holidays call [phone] and ask for the Oncologist on Call.  Reasons to call the Nurse Triage Line:  Fever of 100.5 or greater  Nausea and/or vomiting not relived with nausea medicine  Diarrhea or constipation  Severe pain not relieved with usual pain regimen  Shortness of breath  Uncontrolled bleeding  Mental status changes    Lab on 12/11/2018   Component Date Value Ref Range Status   ??? WBC 12/11/2018 0.5* 4.5 - 11.0 10*9/L Final   ??? RBC 12/11/2018 3.26* 4.50 - 5.90 10*12/L Final   ??? HGB 12/11/2018 9.8* 13.5 - 17.5 g/dL Final   ??? HCT 57/84/6962 27.6* 41.0 - 53.0 % Final   ??? MCV 12/11/2018 84.5  80.0 - 100.0 fL Final   ??? MCH 12/11/2018 30.0  26.0 - 34.0 pg Final   ??? MCHC 12/11/2018 35.4  31.0 - 37.0 g/dL Final   ??? RDW 95/28/4132 14.9  12.0 - 15.0 % Final   ??? MPV 12/11/2018 11.0* 7.0 - 10.0 fL Final   ??? Platelet 12/11/2018 22* 150 - 440 10*9/L Final   ??? Neutrophils % 12/11/2018 8.8  % Final   ??? Lymphocytes % 12/11/2018 84.7  % Final   ??? Monocytes % 12/11/2018 2.6  % Final   ??? Eosinophils % 12/11/2018 0.9  % Final   ??? Basophils % 12/11/2018 0.4  % Final   ??? Absolute Neutrophils 12/11/2018 0.1* 2.0 - 7.5 10*9/L Final   ??? Absolute Lymphocytes 12/11/2018 0.4* 1.5 - 5.0 10*9/L Final   ??? Absolute Monocytes 12/11/2018 0.0* 0.2 - 0.8 10*9/L Final   ??? Absolute Eosinophils 12/11/2018 0.0  0.0 - 0.4 10*9/L Final   ??? Absolute Basophils 12/11/2018 0.0  0.0 - 0.1 10*9/L Final   ??? Large Unstained Cells 12/11/2018 3  0 - 4 % Final   ??? Hyperchromasia 12/11/2018 Slight* Not Present Final   Hospital Outpatient Visit on 12/11/2018   Component Date Value Ref Range Status   ??? Unit Blood Type 12/11/2018 O Pos   Final   ??? ISBT Number 12/11/2018 5100   Final   ??? Unit # 12/11/2018 [id number]   Final   ??? Status 12/11/2018 Issued   Final   ??? Product ID 12/11/2018 Platelets   Final   ??? PRODUCT CODE 12/11/2018 [id number]   Final   ??? Platelet 12/11/2018 41* 150 - 440 10*9/L Final

## 2018-12-12 NOTE — Unmapped (Signed)
Care assumed at 1536 - Platelet count drawn at 1606. Post platelet count resulted at 41 below LP parameters. AVS given to pt, discharged from infusion in NAD.

## 2018-12-12 NOTE — Unmapped (Signed)
Encounter addended by: Thyra Breed, NP on: 12/11/2018 4:58 PM   Actions taken: Clinical Note Signed, LOS modified

## 2018-12-13 ENCOUNTER — Ambulatory Visit: Payer: Medicare HMO

## 2018-12-14 ENCOUNTER — Ambulatory Visit: Payer: Medicare HMO

## 2018-12-17 ENCOUNTER — Other Ambulatory Visit: Admit: 2018-12-17 | Discharge: 2018-12-17 | Payer: MEDICARE

## 2018-12-17 ENCOUNTER — Ambulatory Visit: Admit: 2018-12-17 | Discharge: 2018-12-17 | Payer: MEDICARE

## 2018-12-17 DIAGNOSIS — C9200 Acute myeloblastic leukemia, not having achieved remission: Secondary | ICD-10-CM | POA: Diagnosis not present

## 2018-12-17 DIAGNOSIS — C864 Blastic NK-cell lymphoma: Principal | ICD-10-CM

## 2018-12-17 LAB — CBC W/ AUTO DIFF
BASOPHILS ABSOLUTE COUNT: 0 10*9/L (ref 0.0–0.1)
BASOPHILS RELATIVE PERCENT: 0.9 %
EOSINOPHILS ABSOLUTE COUNT: 0 10*9/L (ref 0.0–0.4)
EOSINOPHILS RELATIVE PERCENT: 0.2 %
HEMATOCRIT: 23.2 % — ABNORMAL LOW (ref 41.0–53.0)
HEMOGLOBIN: 8.2 g/dL — ABNORMAL LOW (ref 13.5–17.5)
LARGE UNSTAINED CELLS: 5 % — ABNORMAL HIGH (ref 0–4)
LYMPHOCYTES ABSOLUTE COUNT: 0.6 10*9/L — ABNORMAL LOW (ref 1.5–5.0)
LYMPHOCYTES RELATIVE PERCENT: 87 %
MEAN CORPUSCULAR HEMOGLOBIN: 29.6 pg (ref 26.0–34.0)
MEAN CORPUSCULAR VOLUME: 83.5 fL (ref 80.0–100.0)
MEAN PLATELET VOLUME: 10.6 fL — ABNORMAL HIGH (ref 7.0–10.0)
MONOCYTES ABSOLUTE COUNT: 0 10*9/L — ABNORMAL LOW (ref 0.2–0.8)
MONOCYTES RELATIVE PERCENT: 2 %
NEUTROPHILS ABSOLUTE COUNT: 0 10*9/L — CL (ref 2.0–7.5)
NEUTROPHILS RELATIVE PERCENT: 5.1 %
NUCLEATED RED BLOOD CELLS: 2 /100{WBCs} (ref <=4)
PLATELET COUNT: 39 10*9/L — ABNORMAL LOW (ref 150–440)
RED BLOOD CELL COUNT: 2.78 10*12/L — ABNORMAL LOW (ref 4.50–5.90)
RED CELL DISTRIBUTION WIDTH: 14.8 % (ref 12.0–15.0)
WBC ADJUSTED: 0.7 10*9/L — ABNORMAL LOW (ref 4.5–11.0)

## 2018-12-17 LAB — SMEAR REVIEW

## 2018-12-17 LAB — MEAN PLATELET VOLUME: Lab: 10.6 — ABNORMAL HIGH

## 2018-12-17 NOTE — Unmapped (Signed)
Patient arrived to chair 17.  No complaints noted. Patient states he feels fine. Access of PIC intact.  Patient states it was changed 12/12/18.     AVS given and patient discharged to home.

## 2018-12-17 NOTE — Unmapped (Signed)
If you feel like this is an emergency please call 911.  For appointments or questions Monday through Friday 8AM-5PM please call [phone] or Toll Free [phone]. For Medical questions or concerns ask for the Nurse Triage Line.  On Nights, Weekends, and Holidays call [phone] and ask for the Oncologist on Call.  Reasons to call the Nurse Triage Line:  Fever of 100.5 or greater  Nausea and/or vomiting not relived with nausea medicine  Diarrhea or constipation  Severe pain not relieved with usual pain regimen  Shortness of breath  Uncontrolled bleeding  Mental status changes    Lab on 12/17/2018   Component Date Value Ref Range Status   ??? Results Verified by Slide Scan 12/17/2018 Slide Reviewed   Final   ??? WBC 12/17/2018 0.7* 4.5 - 11.0 10*9/L Final   ??? RBC 12/17/2018 2.78* 4.50 - 5.90 10*12/L Final   ??? HGB 12/17/2018 8.2* 13.5 - 17.5 g/dL Final   ??? HCT 57/84/6962 23.2* 41.0 - 53.0 % Final   ??? MCV 12/17/2018 83.5  80.0 - 100.0 fL Final   ??? MCH 12/17/2018 29.6  26.0 - 34.0 pg Final   ??? MCHC 12/17/2018 35.5  31.0 - 37.0 g/dL Final   ??? RDW 95/28/4132 14.8  12.0 - 15.0 % Final   ??? MPV 12/17/2018 10.6* 7.0 - 10.0 fL Final   ??? Platelet 12/17/2018 39* 150 - 440 10*9/L Final   ??? nRBC 12/17/2018 2  <=4 /100 WBCs Final   ??? Variable HGB Concentration 12/17/2018 Slight* Not Present Final   ??? Neutrophils % 12/17/2018 5.1  % Final   ??? Lymphocytes % 12/17/2018 87.0  % Final   ??? Monocytes % 12/17/2018 2.0  % Final   ??? Eosinophils % 12/17/2018 0.2  % Final   ??? Basophils % 12/17/2018 0.9  % Final   ??? Absolute Neutrophils 12/17/2018 0.0* 2.0 - 7.5 10*9/L Final   ??? Absolute Lymphocytes 12/17/2018 0.6* 1.5 - 5.0 10*9/L Final   ??? Absolute Monocytes 12/17/2018 0.0* 0.2 - 0.8 10*9/L Final   ??? Absolute Eosinophils 12/17/2018 0.0  0.0 - 0.4 10*9/L Final   ??? Absolute Basophils 12/17/2018 0.0  0.0 - 0.1 10*9/L Final   ??? Large Unstained Cells 12/17/2018 5* 0 - 4 % Final   ??? Hyperchromasia 12/17/2018 Slight* Not Present Final   ??? Smear Review Comments 12/17/2018 See Comment* Undefined Final    Slide reviewed     ??? Dohle Bodies 12/17/2018 Present* Not Present Final

## 2018-12-17 NOTE — Unmapped (Signed)
Labs drawn and sent for analysis.  Care provided by  Brooks Sailors, RN

## 2018-12-19 ENCOUNTER — Ambulatory Visit: Admit: 2018-12-19 | Discharge: 2018-12-20 | Payer: MEDICARE

## 2018-12-19 ENCOUNTER — Other Ambulatory Visit: Admit: 2018-12-19 | Discharge: 2018-12-20 | Payer: MEDICARE

## 2018-12-19 DIAGNOSIS — C864 Blastic NK-cell lymphoma: Secondary | ICD-10-CM | POA: Diagnosis not present

## 2018-12-19 DIAGNOSIS — C9200 Acute myeloblastic leukemia, not having achieved remission: Secondary | ICD-10-CM | POA: Diagnosis not present

## 2018-12-19 DIAGNOSIS — Z5111 Encounter for antineoplastic chemotherapy: Secondary | ICD-10-CM | POA: Diagnosis not present

## 2018-12-19 LAB — COMPREHENSIVE METABOLIC PANEL
ALKALINE PHOSPHATASE: 394 U/L — ABNORMAL HIGH (ref 38–126)
ALT (SGPT): 8 U/L (ref <50)
ANION GAP: 8 mmol/L (ref 7–15)
AST (SGOT): 13 U/L — ABNORMAL LOW (ref 19–55)
BILIRUBIN TOTAL: 0.5 mg/dL (ref 0.0–1.2)
BLOOD UREA NITROGEN: 9 mg/dL (ref 7–21)
BUN / CREAT RATIO: 7
CALCIUM: 7.8 mg/dL — ABNORMAL LOW (ref 8.5–10.2)
CO2: 25 mmol/L (ref 22.0–30.0)
CREATININE: 1.25 mg/dL (ref 0.70–1.30)
EGFR CKD-EPI AA MALE: 68 mL/min/{1.73_m2} (ref >=60)
EGFR CKD-EPI NON-AA MALE: 59 mL/min/{1.73_m2} — ABNORMAL LOW (ref >=60)
GLUCOSE RANDOM: 148 mg/dL (ref 70–179)
POTASSIUM: 3.5 mmol/L (ref 3.5–5.0)
PROTEIN TOTAL: 5.7 g/dL — ABNORMAL LOW (ref 6.5–8.3)
SODIUM: 136 mmol/L (ref 135–145)

## 2018-12-19 LAB — CBC W/ AUTO DIFF
BASOPHILS RELATIVE PERCENT: 1.3 %
EOSINOPHILS ABSOLUTE COUNT: 0 10*9/L (ref 0.0–0.4)
EOSINOPHILS RELATIVE PERCENT: 0 %
HEMATOCRIT: 21.2 % — ABNORMAL LOW (ref 41.0–53.0)
HEMOGLOBIN: 7.5 g/dL — ABNORMAL LOW (ref 13.5–17.5)
LYMPHOCYTES ABSOLUTE COUNT: 0.5 10*9/L — ABNORMAL LOW (ref 1.5–5.0)
LYMPHOCYTES RELATIVE PERCENT: 86.8 %
MEAN CORPUSCULAR HEMOGLOBIN: 29.5 pg (ref 26.0–34.0)
MEAN CORPUSCULAR VOLUME: 82.8 fL (ref 80.0–100.0)
MEAN PLATELET VOLUME: 10.3 fL — ABNORMAL HIGH (ref 7.0–10.0)
MONOCYTES ABSOLUTE COUNT: 0 10*9/L — ABNORMAL LOW (ref 0.2–0.8)
MONOCYTES RELATIVE PERCENT: 1.2 %
NEUTROPHILS ABSOLUTE COUNT: 0 10*9/L — CL (ref 2.0–7.5)
NEUTROPHILS RELATIVE PERCENT: 6.5 %
PLATELET COUNT: 48 10*9/L — ABNORMAL LOW (ref 150–440)
RED BLOOD CELL COUNT: 2.56 10*12/L — ABNORMAL LOW (ref 4.50–5.90)
RED CELL DISTRIBUTION WIDTH: 14.3 % (ref 12.0–15.0)
WBC ADJUSTED: 0.6 10*9/L — ABNORMAL LOW (ref 4.5–11.0)

## 2018-12-19 LAB — SODIUM: Sodium:SCnc:Pt:Ser/Plas:Qn:: 136

## 2018-12-19 LAB — PLATELET COUNT: Lab: 85 — ABNORMAL LOW

## 2018-12-19 LAB — MONOCYTES RELATIVE PERCENT: Lab: 1.2

## 2018-12-19 NOTE — Unmapped (Signed)
5621: Pt to be transfused 2 units of blood.   Pt to be transfused 1 units of Platelets.    Consent for blood products up to date.     Type and cross has been sent. ABO noted in system.     0902: Blood bank reports that the Pt's Plts are not ready and they need to verify need.     1017: Pt ambulated to and back from the restroom.     1350: Pt went to fluro for his LP.

## 2018-12-19 NOTE — Unmapped (Signed)
Labs drawn and sent for analysis.  Care provided by  Y Nemtsev, RN

## 2018-12-19 NOTE — Unmapped (Signed)
Patient will receive PRBC and platelet transfusion today. Platelet count at 48-must be greater than 50 for IT chemo.

## 2018-12-20 NOTE — Unmapped (Signed)
Pt arrived after fluoroscopy procedures. & remains bed resting  & 2nd unit of PRBC initiated.       1unit of PRBC infused uneventfully & Pt d/c in w/c escorted by family.

## 2018-12-20 NOTE — Unmapped (Signed)
If you feel like this is an emergency please call 911.  For appointments or questions Monday through Friday 8AM-5PM please call [phone] or Toll Free [phone]. For Medical questions or concerns ask for the Nurse Triage Line.  On Nights, Weekends, and Holidays call [phone] and ask for the Oncologist on Call.  Reasons to call the Nurse Triage Line:  Fever of 100.5 or greater  Nausea and/or vomiting not relived with nausea medicine  Diarrhea or constipation  Severe pain not relieved with usual pain regimen  Shortness of breath  Uncontrolled bleeding  Mental status changes    Lab on 12/19/2018   Component Date Value Ref Range Status   ??? Sodium 12/19/2018 136  135 - 145 mmol/L Final   ??? Potassium 12/19/2018 3.5  3.5 - 5.0 mmol/L Final   ??? Chloride 12/19/2018 103  98 - 107 mmol/L Final   ??? Anion Gap 12/19/2018 8  7 - 15 mmol/L Final   ??? CO2 12/19/2018 25.0  22.0 - 30.0 mmol/L Final   ??? BUN 12/19/2018 9  7 - 21 mg/dL Final   ??? Creatinine 12/19/2018 1.25  0.70 - 1.30 mg/dL Final   ??? BUN/Creatinine Ratio 12/19/2018 7   Final   ??? EGFR CKD-EPI Non-African American,* 12/19/2018 59* >=60 mL/min/1.38m2 Final   ??? EGFR CKD-EPI African American, Male 12/19/2018 68  >=60 mL/min/1.38m2 Final   ??? Glucose 12/19/2018 148  70 - 179 mg/dL Final   ??? Calcium 28/41/3244 7.8* 8.5 - 10.2 mg/dL Final   ??? Albumin 12/07/7251 3.1* 3.5 - 5.0 g/dL Final   ??? Total Protein 12/19/2018 5.7* 6.5 - 8.3 g/dL Final   ??? Total Bilirubin 12/19/2018 0.5  0.0 - 1.2 mg/dL Final   ??? AST 66/44/0347 13* 19 - 55 U/L Final   ??? ALT 12/19/2018 8  <50 U/L Final   ??? Alkaline Phosphatase 12/19/2018 394* 38 - 126 U/L Final   ??? WBC 12/19/2018 0.6* 4.5 - 11.0 10*9/L Final   ??? RBC 12/19/2018 2.56* 4.50 - 5.90 10*12/L Final   ??? HGB 12/19/2018 7.5* 13.5 - 17.5 g/dL Final   ??? HCT 42/59/5638 21.2* 41.0 - 53.0 % Final   ??? MCV 12/19/2018 82.8  80.0 - 100.0 fL Final   ??? MCH 12/19/2018 29.5  26.0 - 34.0 pg Final   ??? MCHC 12/19/2018 35.6  31.0 - 37.0 g/dL Final   ??? RDW 75/64/3329 14.3  12.0 - 15.0 % Final   ??? MPV 12/19/2018 10.3* 7.0 - 10.0 fL Final   ??? Platelet 12/19/2018 48* 150 - 440 10*9/L Final   ??? Variable HGB Concentration 12/19/2018 Slight* Not Present Final   ??? Neutrophils % 12/19/2018 6.5  % Final   ??? Lymphocytes % 12/19/2018 86.8  % Final   ??? Monocytes % 12/19/2018 1.2  % Final   ??? Eosinophils % 12/19/2018 0.0  % Final   ??? Basophils % 12/19/2018 1.3  % Final   ??? Absolute Neutrophils 12/19/2018 0.0* 2.0 - 7.5 10*9/L Final   ??? Absolute Lymphocytes 12/19/2018 0.5* 1.5 - 5.0 10*9/L Final   ??? Absolute Monocytes 12/19/2018 0.0* 0.2 - 0.8 10*9/L Final   ??? Absolute Eosinophils 12/19/2018 0.0  0.0 - 0.4 10*9/L Final   ??? Absolute Basophils 12/19/2018 0.0  0.0 - 0.1 10*9/L Final   ??? Large Unstained Cells 12/19/2018 4  0 - 4 % Final   ??? Hyperchromasia 12/19/2018 Slight* Not Present Final   Hospital Outpatient Visit on 12/19/2018   Component Date Value Ref  Range Status   ??? Crossmatch 12/19/2018 Compatible   Final   ??? Unit Blood Type 12/19/2018 A Pos   Final   ??? ISBT Number 12/19/2018 6200   Final   ??? Unit # 12/19/2018 [id number]   Final   ??? Status 12/19/2018 Issued   Final   ??? Spec Expiration 12/19/2018 [id number]   Final   ??? Product ID 12/19/2018 Red Blood Cells   Final   ??? PRODUCT CODE 12/19/2018 [id number]   Final   ??? Crossmatch 12/19/2018 Compatible   Final   ??? Unit Blood Type 12/19/2018 A Pos   Final   ??? ISBT Number 12/19/2018 6200   Final   ??? Unit # 12/19/2018 [id number]   Final   ??? Status 12/19/2018 Issued   Final   ??? Spec Expiration 12/19/2018 [id number]   Final   ??? Product ID 12/19/2018 Red Blood Cells   Final   ??? PRODUCT CODE 12/19/2018 [id number]   Final   ??? Unit Blood Type 12/19/2018 A Pos   Final   ??? ISBT Number 12/19/2018 6200   Final   ??? Unit # 12/19/2018 [id number]   Final   ??? Status 12/19/2018 Issued   Final   ??? Product ID 12/19/2018 Platelets   Final   ??? PRODUCT CODE 12/19/2018 [id number]   Final   ??? Platelet 12/19/2018 85* 150 - 440 10*9/L Final Hospital Outpatient Visit on 12/19/2018   Component Date Value Ref Range Status   ??? Protein, CSF 12/19/2018 99* 15 - 45 mg/dL Final   ??? Glucose, CSF 12/19/2018 62  50 - 75 mg/dL Final

## 2018-12-25 ENCOUNTER — Ambulatory Visit: Admit: 2018-12-25 | Discharge: 2018-12-25 | Payer: MEDICARE

## 2018-12-25 ENCOUNTER — Other Ambulatory Visit: Admit: 2018-12-25 | Discharge: 2018-12-25 | Payer: MEDICARE

## 2018-12-25 DIAGNOSIS — C9200 Acute myeloblastic leukemia, not having achieved remission: Secondary | ICD-10-CM | POA: Diagnosis not present

## 2018-12-25 LAB — CBC W/ AUTO DIFF
BASOPHILS RELATIVE PERCENT: 0.2 %
EOSINOPHILS ABSOLUTE COUNT: 0 10*9/L (ref 0.0–0.4)
EOSINOPHILS RELATIVE PERCENT: 0.4 %
HEMATOCRIT: 28.9 % — ABNORMAL LOW (ref 41.0–53.0)
HEMOGLOBIN: 10.1 g/dL — ABNORMAL LOW (ref 13.5–17.5)
LARGE UNSTAINED CELLS: 7 % — ABNORMAL HIGH (ref 0–4)
LYMPHOCYTES ABSOLUTE COUNT: 0.5 10*9/L — ABNORMAL LOW (ref 1.5–5.0)
LYMPHOCYTES RELATIVE PERCENT: 84.2 %
MEAN CORPUSCULAR HEMOGLOBIN CONC: 34.8 g/dL (ref 31.0–37.0)
MEAN CORPUSCULAR HEMOGLOBIN: 29.6 pg (ref 26.0–34.0)
MEAN PLATELET VOLUME: 10 fL (ref 7.0–10.0)
MONOCYTES ABSOLUTE COUNT: 0 10*9/L — ABNORMAL LOW (ref 0.2–0.8)
NEUTROPHILS ABSOLUTE COUNT: 0 10*9/L — CL (ref 2.0–7.5)
NEUTROPHILS RELATIVE PERCENT: 6.7 %
PLATELET COUNT: 61 10*9/L — ABNORMAL LOW (ref 150–440)
RED BLOOD CELL COUNT: 3.39 10*12/L — ABNORMAL LOW (ref 4.50–5.90)
RED CELL DISTRIBUTION WIDTH: 14.3 % (ref 12.0–15.0)
WBC ADJUSTED: 0.6 10*9/L — ABNORMAL LOW (ref 4.5–11.0)

## 2018-12-25 LAB — SMEAR REVIEW

## 2018-12-25 LAB — BASOPHILS ABSOLUTE COUNT: Lab: 0

## 2018-12-25 LAB — SLIDE REVIEW

## 2018-12-25 NOTE — Unmapped (Signed)
Labs drawn and sent for analysis.  Care provided by  L Klein, RN

## 2018-12-25 NOTE — Unmapped (Signed)
Patient arrived to chair 11 for BM BX.  No complaints noted.  Access of PICC.      Tolerated procedure.  AVS given and discharged to home.

## 2018-12-25 NOTE — Unmapped (Signed)
Please leave dressing in place and keep it dry for 24 hrs before removing. You can resume normal activities tomorrow, but take things easy today.     You may take tylenol as needed for discomfort at the area where the biopsy was taken.   After receiving Ativan, please do not drive today.     If you have questions between 8am to 5 pm Monday through Friday please call [phone] and speak to the operator.      For emergencies, evenings or weekends, please call [phone] and ask for oncology fellow on call.     Reasons to call emergency line may include:   Fever of 100.5 or greater   Nausea and/or vomiting not relieved with nausea medicine   Diarrhea or constipation   Severe pain not relieved with usual pain regimen       Lab on 12/25/2018   Component Date Value Ref Range Status   ??? ABO Grouping 12/25/2018 A POS   Final   ??? WBC 12/25/2018 0.6* 4.5 - 11.0 10*9/L Final   ??? RBC 12/25/2018 3.39* 4.50 - 5.90 10*12/L Final   ??? HGB 12/25/2018 10.1* 13.5 - 17.5 g/dL Final   ??? HCT 29/56/2130 28.9* 41.0 - 53.0 % Final   ??? MCV 12/25/2018 85.2  80.0 - 100.0 fL Final   ??? MCH 12/25/2018 29.6  26.0 - 34.0 pg Final   ??? MCHC 12/25/2018 34.8  31.0 - 37.0 g/dL Final   ??? RDW 86/57/8469 14.3  12.0 - 15.0 % Final   ??? MPV 12/25/2018 10.0  7.0 - 10.0 fL Final   ??? Platelet 12/25/2018 61* 150 - 440 10*9/L Final   ??? Neutrophils % 12/25/2018 6.7  % Final   ??? Lymphocytes % 12/25/2018 84.2  % Final   ??? Monocytes % 12/25/2018 1.7  % Final   ??? Eosinophils % 12/25/2018 0.4  % Final   ??? Basophils % 12/25/2018 0.2  % Final   ??? Absolute Neutrophils 12/25/2018 0.0* 2.0 - 7.5 10*9/L Final   ??? Absolute Lymphocytes 12/25/2018 0.5* 1.5 - 5.0 10*9/L Final   ??? Absolute Monocytes 12/25/2018 0.0* 0.2 - 0.8 10*9/L Final   ??? Absolute Eosinophils 12/25/2018 0.0  0.0 - 0.4 10*9/L Final   ??? Absolute Basophils 12/25/2018 0.0  0.0 - 0.1 10*9/L Final   ??? Large Unstained Cells 12/25/2018 7* 0 - 4 % Final

## 2018-12-25 NOTE — Unmapped (Signed)
Date of Service: 12/25/2018      Patient Active Problem List   Diagnosis   ??? Chronic kidney disease (CKD), stage III (moderate) (CMS-[name])   ??? Hyperlipidemia associated with type 2 diabetes mellitus (CMS-[name])   ??? Type 2 diabetes mellitus with diabetic polyneuropathy, with long-term current use of insulin (CMS-[name])   ??? Aortic valve endocarditis   ??? Hypertension   ??? 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 Weakness of both lower extremities   ??? Acute myeloid leukemia not having achieved remission (CMS-[name])       Indication:    Diagnosis ICD-10-CM Associated Orders   1. Acute myeloid leukemia not having achieved remission (CMS-[name]) C92.00 Hematopathology Order     Hematopathology Order     Cytogenetics Cancer/FISH NON-BLOOD     Cytogenetics Cancer/FISH NON-BLOOD     NPM1, Quantitative Type A     NPM1, Quantitative Type A     AML MRD, Flow, Bone Marrow (Sendout)     AML MRD, Flow, Bone Marrow (Sendout)       Premedication: None    Ordering Provider: Tessa Lerner, MD    Clinician(s) Performing Procedure:  Arna Medici, AGNP        Bone Marrow Aspirate and Biopsy, right side    The procedure risks and alternatives of the procedure were explained to the patient.  The patient verbalized understanding and signed informed consent. After a time-out in which his patient identifiers were checked by 2 providers, the patient was laid in prone position on the table.   The  posterior superior iliac spine and iliac crest were cleaned, prepped and draped in the usual sterile fashion.     Anesthetic agent used: 2% plain lidocaine.      Utilizing a Ranfac needle, a bone marrow aspiration and biopsy was performed.  Specimen was sent for routine histopathologic stains and sectioning, flow cytometry, cytogenetics and molecular analysis.     A pressure dressing was applied to the biopsy site.  Patient tolerated the procedure well.  Hemostasis was confirmed upon discharge.     The patient was given verbal instructions for wound care, such as to keep the biopsy site dry, covered for 24 hours, and to call your physician for a temperature > 100.5.  Tylenol may be taken for discomfort.    Specimens Collected:  EDTA x 2  Heparin x 1  Core biopsy x 2

## 2018-12-26 NOTE — Unmapped (Signed)
Encounter addended by: Coralie Keens on: 12/25/2018 4:34 PM   Actions taken: Test resulted

## 2018-12-27 ENCOUNTER — Other Ambulatory Visit: Admit: 2018-12-27 | Discharge: 2018-12-27 | Payer: MEDICARE

## 2018-12-27 ENCOUNTER — Ambulatory Visit
Admit: 2018-12-27 | Discharge: 2018-12-27 | Payer: MEDICARE | Attending: Hematology & Oncology | Primary: Hematology & Oncology

## 2018-12-27 ENCOUNTER — Ambulatory Visit: Admit: 2018-12-27 | Discharge: 2018-12-27 | Payer: MEDICARE

## 2018-12-27 DIAGNOSIS — C9200 Acute myeloblastic leukemia, not having achieved remission: Secondary | ICD-10-CM | POA: Diagnosis not present

## 2018-12-27 DIAGNOSIS — Z5111 Encounter for antineoplastic chemotherapy: Secondary | ICD-10-CM | POA: Diagnosis not present

## 2018-12-27 DIAGNOSIS — R896 Abnormal cytological findings in specimens from other organs, systems and tissues: Secondary | ICD-10-CM | POA: Diagnosis not present

## 2018-12-27 LAB — LYMPHOCYTES RELATIVE PERCENT: Lab: 87.4

## 2018-12-27 LAB — COMPREHENSIVE METABOLIC PANEL
ALBUMIN: 3.2 g/dL — ABNORMAL LOW (ref 3.5–5.0)
ALT (SGPT): 11 U/L (ref <50)
ANION GAP: 10 mmol/L (ref 7–15)
AST (SGOT): 15 U/L — ABNORMAL LOW (ref 19–55)
BILIRUBIN TOTAL: 0.5 mg/dL (ref 0.0–1.2)
BLOOD UREA NITROGEN: 8 mg/dL (ref 7–21)
BUN / CREAT RATIO: 6
CALCIUM: 8 mg/dL — ABNORMAL LOW (ref 8.5–10.2)
CHLORIDE: 104 mmol/L (ref 98–107)
CO2: 24 mmol/L (ref 22.0–30.0)
CREATININE: 1.28 mg/dL (ref 0.70–1.30)
EGFR CKD-EPI NON-AA MALE: 57 mL/min/{1.73_m2} — ABNORMAL LOW (ref >=60)
GLUCOSE RANDOM: 142 mg/dL (ref 70–179)
POTASSIUM: 4.2 mmol/L (ref 3.5–5.0)
PROTEIN TOTAL: 5.8 g/dL — ABNORMAL LOW (ref 6.5–8.3)
SODIUM: 138 mmol/L (ref 135–145)

## 2018-12-27 LAB — CBC W/ AUTO DIFF
BASOPHILS ABSOLUTE COUNT: 0 10*9/L (ref 0.0–0.1)
BASOPHILS RELATIVE PERCENT: 0.6 %
EOSINOPHILS ABSOLUTE COUNT: 0 10*9/L (ref 0.0–0.4)
EOSINOPHILS RELATIVE PERCENT: 0.2 %
HEMATOCRIT: 27.8 % — ABNORMAL LOW (ref 41.0–53.0)
HEMOGLOBIN: 9.8 g/dL — ABNORMAL LOW (ref 13.5–17.5)
LARGE UNSTAINED CELLS: 5 % — ABNORMAL HIGH (ref 0–4)
LYMPHOCYTES ABSOLUTE COUNT: 0.5 10*9/L — ABNORMAL LOW (ref 1.5–5.0)
MEAN CORPUSCULAR HEMOGLOBIN CONC: 35.4 g/dL (ref 31.0–37.0)
MEAN CORPUSCULAR VOLUME: 85 fL (ref 80.0–100.0)
MEAN PLATELET VOLUME: 9.5 fL (ref 7.0–10.0)
MONOCYTES ABSOLUTE COUNT: 0 10*9/L — ABNORMAL LOW (ref 0.2–0.8)
MONOCYTES RELATIVE PERCENT: 2.1 %
NEUTROPHILS ABSOLUTE COUNT: 0 10*9/L — CL (ref 2.0–7.5)
PLATELET COUNT: 55 10*9/L — ABNORMAL LOW (ref 150–440)
RED BLOOD CELL COUNT: 3.27 10*12/L — ABNORMAL LOW (ref 4.50–5.90)
RED CELL DISTRIBUTION WIDTH: 15.3 % — ABNORMAL HIGH (ref 12.0–15.0)
WBC ADJUSTED: 0.6 10*9/L — ABNORMAL LOW (ref 4.5–11.0)

## 2018-12-27 LAB — CHLORIDE: Chloride:SCnc:Pt:Ser/Plas:Qn:: 104

## 2018-12-27 NOTE — Unmapped (Signed)
Labs drawn and sent for analysis.  Care provided by  Y Nemtsev, RN

## 2018-12-27 NOTE — Unmapped (Addendum)
It was great to see you today.     I will follow up with the results of the lumbar puncture today.     Unfortunately, the leukemia is not improving with the chemotherapy. Please stop taking Venetoclax starting today.     Please continue taking Micafungin 50 mg IV until Thursday, 01/30. Starting on 01/30, please take Posaconazole 300 mg daily instead of the Micafungin.     We discussed the lack of available treatment options for your leukemia. There are limited options available that are not known to be very effective. We discussed a clinical trial with an agent called Alvocidib. This is a randomized clinical trial comparing single agent Alvocidib to Alvocidib and low dose cytarabine. You would likely be enrolled on the Alvocidib and low dose cytarabine arm. We will begin evaluating you for this study. We would not be able to start this treatment for another 3 weeks.     Please return to clinic on Wednesday, 01/29 and we will plan to consent you to the study and begin evaluating you for the trial.     If you have questions or concerns at night or on the weekend, call the hospital operator at [phone] and ask to speak to the hematology/oncology fellow on call.  ??  If you have questions or concerns on a week day during business hours, you may call the Nurse call line at [phone].    Hospital Outpatient Visit on 12/27/2018   Component Date Value Ref Range Status   ??? Protein, CSF 12/27/2018 87* 15 - 45 mg/dL Final   ??? Glucose, CSF 12/27/2018 53  50 - 75 mg/dL Final   ??? Tube # CSF 12/27/2018 Tube 1   Preliminary   ??? Color, CSF 12/27/2018 Pink   Preliminary   ??? Appearance, CSF 12/27/2018 Hazy   Preliminary   ??? Supernatant Color (Spun), CSF 12/27/2018 Colorless   Preliminary   ??? Spun Appearance, CSF 12/27/2018 Clear   Preliminary   ??? Nucleated Cells, CSF 12/27/2018 0  0 - 5 ul Preliminary   ??? RBC, CSF 12/27/2018 2,850* 0 ul Preliminary   Lab on 12/27/2018   Component Date Value Ref Range Status   ??? Sodium 12/27/2018 138  135 - 145 mmol/L Final   ??? Potassium 12/27/2018 4.2  3.5 - 5.0 mmol/L Final   ??? Chloride 12/27/2018 104  98 - 107 mmol/L Final   ??? Anion Gap 12/27/2018 10  7 - 15 mmol/L Final   ??? CO2 12/27/2018 24.0  22.0 - 30.0 mmol/L Final   ??? BUN 12/27/2018 8  7 - 21 mg/dL Final   ??? Creatinine 12/27/2018 1.28  0.70 - 1.30 mg/dL Final   ??? BUN/Creatinine Ratio 12/27/2018 6   Final   ??? EGFR CKD-EPI Non-African American,* 12/27/2018 57* >=60 mL/min/1.46m2 Final   ??? EGFR CKD-EPI African American, Male 12/27/2018 66  >=60 mL/min/1.72m2 Final   ??? Glucose 12/27/2018 142  70 - 179 mg/dL Final   ??? Calcium 24/40/1027 8.0* 8.5 - 10.2 mg/dL Final   ??? Albumin 25/36/6440 3.2* 3.5 - 5.0 g/dL Final   ??? Total Protein 12/27/2018 5.8* 6.5 - 8.3 g/dL Final   ??? Total Bilirubin 12/27/2018 0.5  0.0 - 1.2 mg/dL Final   ??? AST 34/74/2595 15* 19 - 55 U/L Final   ??? ALT 12/27/2018 11  <50 U/L Final   ??? Alkaline Phosphatase 12/27/2018 368* 38 - 126 U/L Final   ??? WBC 12/27/2018 0.6* 4.5 - 11.0 10*9/L Final   ???  RBC 12/27/2018 3.27* 4.50 - 5.90 10*12/L Final   ??? HGB 12/27/2018 9.8* 13.5 - 17.5 g/dL Final   ??? HCT 16/09/9603 27.8* 41.0 - 53.0 % Final   ??? MCV 12/27/2018 85.0  80.0 - 100.0 fL Final   ??? MCH 12/27/2018 30.1  26.0 - 34.0 pg Final   ??? MCHC 12/27/2018 35.4  31.0 - 37.0 g/dL Final   ??? RDW 54/08/8118 15.3* 12.0 - 15.0 % Final   ??? MPV 12/27/2018 9.5  7.0 - 10.0 fL Final   ??? Platelet 12/27/2018 55* 150 - 440 10*9/L Final   ??? Neutrophils % 12/27/2018 4.6  % Final   ??? Lymphocytes % 12/27/2018 87.4  % Final   ??? Monocytes % 12/27/2018 2.1  % Final   ??? Eosinophils % 12/27/2018 0.2  % Final   ??? Basophils % 12/27/2018 0.6  % Final   ??? Absolute Neutrophils 12/27/2018 0.0* 2.0 - 7.5 10*9/L Final   ??? Absolute Lymphocytes 12/27/2018 0.5* 1.5 - 5.0 10*9/L Final   ??? Absolute Monocytes 12/27/2018 0.0* 0.2 - 0.8 10*9/L Final   ??? Absolute Eosinophils 12/27/2018 0.0  0.0 - 0.4 10*9/L Final   ??? Absolute Basophils 12/27/2018 0.0  0.0 - 0.1 10*9/L Final   ??? Large Unstained Cells 12/27/2018 5* 0 - 4 % Final

## 2018-12-27 NOTE — Unmapped (Signed)
[facility] Follow-up    Patient Name: Kyle Huffman  Patient Age: 68 y.o.  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     I had an extensive discussion with Kyle Huffman. I have reviewed his bone marrow aspirate in comparison to his prior marrow results and unfortunately he has not responded to Azacitidine + Venetoclax and I do not think there is much benefit in continuing this at this time. Median time to response is 1.2 months and given the lack of any response after 2 cycles suggests that he is likely resistant to this regimen. There is no SOC in patients who have refractory disease to Azacitidine + Venetoclax and median OS in a recent analysis with conventional care is approximately 2.5 months- Huffman et al. Fara Boros 2019.     We discussed the following options:    1) Conventional care- despite no effective option, we discussed the role of Gemtuzumab 3 mg/m2 IV days 1, 4 and 7 as a single agent- response rates are approximately 30% in patients with chemosensitive disease and relapsed AML but has not been formally studied in refractory AML where response rates would be considerably less.     2) Clinical trials- we discussed the following clinical trial- We discussed a clinical trial- [id number]-202- Alvocidib +/- LDAC after failing to respond or relapsing after Azacitidine + Venetoclax. The rationale for this trial is that MCL-1 is a dominant resistance mechanism for patients who do not respond or relapse after Venetoclax therapy in AML and Alvocidib is a potent MCL-1 inhibitor that may have clinical activity in this setting. Alvocidib has been studied in >400 AML patients over the last 18 years with promising clinical activity in AML. This is the first study examining its activity after Venetoclax treatment. Moreover, overall outcomes are dismal in patients who do not respond to Azacitidine + Venetoclax (median OS = 2.5 months)- Huffman et al. Fara Boros Abstract 2019. Thus, investigational agents are clearly indicated in this setting. We discussed that alvocidib has been shown to be safe in AML in the context of intensive chemotherapy but this is the first time it is being evaluated after Venetoclax as an outpatient. We discussed the most common side effects of Alvocidib including but not limited to myelosuppression, hepatic dysfunction, diarrhea, tumor lysis, fatigue.    Kyle Huffman and his brother are interested in pursuing this clinical trial. We discussed the need to be off all anti-leukemic therapies for 3 weeks prior to enrollment. He will discontinue Venetoclax today.     Given monocytic phenotype, he received an LP with IT chemotherapy on 01/15 revealing:  -  Few immature-appearing mononuclear cells (2% by manual count); see Comment  ??  This electronic signature is attestation that the pathologist personally reviewed the submitted material(s) and the final diagnosis reflects that evaluation.   Electronically signed by Michelene Heady, MD on 12/20/2018 at 1641   Comment    A definitive blast population is not identified by flow  cytometric analysis. While the immature appearing cells may represent a reactive phenomenon, they are very worrisome in the context of the patient's clinical history. A repeat sampling is recommended.      This was thus repeated today, 01/23, given low suspicion for CNS disease and lack of symptoms. Results revealed:  Final Diagnosis   Date Value Ref Range Status 12/27/2018   Final    Cerebrospinal fluid, cytospin review   -  Possible peripheral blood contamination with a rare blast identified (see Comment)  -  Insufficient cellularity for flow cytometric analysis         This electronic signature is attestation that the pathologist personally reviewed the submitted material(s) and the final diagnosis reflects that evaluation.      - Given the bloody nature of this sample and identification of rare blasts in the peripheral blood, the rare immature in this sample may represent peripheral blood contamination.     I do not suspect that he has CNS disease and given that there was 2,850 red cells seen on LP, this 1 rare blast is likely a PB contaminant as evidenced by the lack of cells to perform flow cytometry. He had 0 nucleated cells seen on LP which is consistent with no evidence of CNS disease.     He will return next week to clinic for further evaluation and screening for TPI-202.     Plan and Recommendations:  1) D/C Venetoclax  2) LP today- no evidence of CNS disease- see results above  3) Cont. Levaquin, Micafungin, Valtrex  4) Switch Micafungin to Posaconazole anti-fungal ppx in 1 week  5) RTC in 1 week    History of Present Illness:  Oncology History    Diagnosis: de novo AML    Presentation: Weakness, pancytopenia    Presenting WBC: 36,000    Diagnostic BM Biopsy: 10/26/2018    Markedly hypercellular bone marrow (>95%) involved by acute myeloid leukemia with monocytic differentiation (65% blasts by manual aspirate differential) (see Comment)     Flow cytometry:    Two populations are analyzed.     The first population of 9% cells is gated on the monocytic cell region. Cells within this region consist of both immature monocytes and maturing monocytes. Both populations express CD4, CD11b, CD11c, CD13, CD15, CD16+56, CD33, CD38, CD64, CD71 and HLA-DR. The immature monocytes aberrantly express CD56 show bright CD4 and CD16+56 expression, and lack CD14.     The second population of 8% cells is gated on the lymphocyte region and is composed of 76% T cells (CD4: CD8 ratio is 1.6:1) without an aberrant immunophenotype, 12% polytypic B cells, and 10% NK cells.     Lastly, a discrete immature population that falls within the typical blast gate is not identified; CD34-positive cells represent <1% of total cells analyzed.     This immunophenotype is characteristic of aberrant immature and maturing monocytes, representing 9% of cells within the specimen of bone marrow.     Cytogenetics: 47,XY,+8[10]/46~47,sl,der(15)t(3;15)(q?21;q26.3)[3]/46,XY[7]    Myeloid Mutational Panel:   Variants of Known or Likely Clinical Significance   Gene  Transcript   Predicted Protein   VAF (%)    ASXL1  c.1934dupG  p.Gly646Trpfs  44    CBL  c.1111T>C  p.Tyr371His  43    NPM1  c.860_863dup  p.Trp288Cysfs  19    SRSF2  c.284C>A  p.Pro95His  15    SRSF2  c.284C>T  p.Pro95Leu  27    TET2  c.1318C>T  p.NFA213*  14  TET2  F.6213_0865HQI  p.Thr1554Serfs  30   ??TET2  c.5126_5142delinsAGACCATTA  p.Cys1709*  47   ??    Variants of Unknown Significance   Gene  Transcript  Predicted Protein  VAF (%)   ??ETV6  c.914A>G  p.Asn305Ser  29     Presented with culture-negative sepsis and Aortic Valve endocarditis    Treatment: Azacitidine + Venetoclax  - Cycle 1: 10/31/2018    Treatment c/b     Final Diagnosis   Date Value Ref Range Status   11/26/2018   Final    Bone marrow, right iliac, biopsy  -  Variably-cellular bone marrow (<10-40%) with persistent acute myeloid leukemia (47% blasts by manual touch preparation differential)        This electronic signature is attestation that the pathologist personally reviewed the submitted material(s) and the final diagnosis reflects that evaluation.        Cycle 2: Azacitidine + Venetoclax  11/29/2018    LP with IT cytarabine:  01/15  Cerebrospinal fluid, cytospin review and flow cytometry  -  Few immature-appearing mononuclear cells (2% by manual count); see Comment  ??  This electronic signature is attestation that the pathologist personally reviewed the submitted material(s) and the final diagnosis reflects that evaluation.   Electronically signed by Michelene Heady, MD on 12/20/2018 at 1641   Comment    A definitive blast population is not identified by flow cytometric analysis. While the immature appearing cells may represent a reactive phenomenon, they are very worrisome in the context of the patient's clinical history. A repeat sampling is recommended.      Final Diagnosis   Date Value Ref Range Status   12/25/2018   Final    Bone marrow, right iliac, aspiration and biopsy  -  Hypercellular bone marrow (95%) with persistent involvement by acute myeloid leukemia (86% blasts by manual touch prep differential)  -  Cytogenetic studies are pending.  -  NPM1 quantitation study is pending.    This electronic signature is attestation that the pathologist personally reviewed the submitted material(s) and the final diagnosis reflects that evaluation.        Final Diagnosis   Date Value Ref Range Status   12/27/2018   Final    Cerebrospinal fluid, cytospin review   -  Possible peripheral blood contamination with a rare blast identified (see Comment)  -  Insufficient cellularity for flow cytometric analysis         This electronic signature is attestation that the pathologist personally reviewed the submitted material(s) and the final diagnosis reflects that evaluation.      - Given the bloody nature of this sample and identification of rare blasts in the peripheral blood, the rare immature in this sample may represent peripheral blood contamination.           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         Interval History:  Since last seen here, Mr. Faraone is doing generally well. He is getting steadily stronger. He can walk by himself. He is fully independent of his ADL's. No N/V/D. Appetite is getting better and better over the course of the last few weeks.     He lives in an old mobile home.     Otherwise, he denies new constitutional symptoms such as anorexia, weight loss, night sweats or unexplained fevers.  Furthermore, he denies symptoms of marrow failure: unexplained bleeding or bruising, recurrent or unexplained intercurrent infections, dyspnea on exertion, lightheadedness, palpitations or chest pain.  There have been no new or unexplained pains or self-identified masses, swelling or enlarged lymph nodes.    Past Medical, Surgical and Family History were reviewed and pertinent updates were made in the Electronic Medical Record    Review of Systems:  Other than as reported above in the interim history, the balance of a full 12-system review was performed and unremarkable.    ECOG Performance Status: 2    Past Medical History:  Past Medical History:   Diagnosis Date   ??? Diabetes mellitus (CMS-[name])    ??? Hyperlipidemia    ??? Hypertension    ??? Stroke (CMS-[name])        Medications:  Current Outpatient Medications   Medication Sig Dispense Refill   ??? amLODIPine (NORVASC) 10 MG tablet Take 1 tablet (10 mg total) by mouth daily. 90 tablet 3   ??? cyclobenzaprine (FLEXERIL) 5 MG tablet TAKE 1 TABLET BY MOUTH THREE TIMES A DAY AS NEEDED FOR MUSCLE SPASMS  0   ??? insulin glargine (LANTUS) 100 unit/mL injection Inject 0.15 mL (15 Units total) under the skin nightly. 4.5 mL 0   ??? insulin lispro (HUMALOG) 100 unit/mL injection Inject 0-0.12 mL (0-12 Units total) under the skin Four (4) times a day (before meals and nightly). See discharge summary for titration. 20 mL 0   ??? levoFLOXacin (LEVAQUIN) 250 MG tablet Take 1 tablet (250 mg total) by mouth daily. 30 tablet 3   ??? mirtazapine (REMERON) 7.5 MG tablet Take 1 tablet (7.5 mg total) by mouth nightly. 90 tablet 3   ??? OLANZapine zydis (ZYPREXA) 5 MG disintegrating tablet Take 0.5 tablets (2.5 mg total) by mouth nightly. 15 tablet 0   ??? ondansetron (ZOFRAN-ODT) 4 MG disintegrating tablet Take 1 tablet (4 mg total) by mouth every eight (8) hours as needed for nausea. 30 tablet 0   ??? ONETOUCH VERIO Strp      ??? posaconazole (NOXAFIL) 100 mg TbEC delayed released tablet Take 3 TABLETS (300 mg) by mouth daily. (Patient not taking: Reported on 11/29/2018) 90 tablet 3   ??? pravastatin (PRAVACHOL) 10 MG tablet Take 1 tablet (10 mg total) by mouth nightly. 30 tablet 0   ??? valACYclovir (VALTREX) 500 MG tablet Take 1 tablet (500 mg total) by mouth daily.  0   ??? venetoclax (VENCLEXTA) 100 mg tablet Take 4 tablets (400 mg total) by mouth daily with a meal and water. Do not chew, crush, or break tablets. 120 tablet 2     No current facility-administered medications for this visit.        Vital Signs:  BSA: There is no height or weight on file to calculate BSA.  There were no vitals filed for this visit.      Physical Exam:  Objective:   Physical Exam: General: Resting  in no apparent distress  HEENT:  Pupils are equal, round and reactive to light and accomodation.  There is no scleral icterus and no conjunctival injection.  The oral mucosa does not demonstrate ulceration, erythema, exudate or purpura.    Lymph node exam:  No lymphadenopathy in the occipital, auricular, anterior/posterior cervical regions.  Heart:  Regular rate and rhythm.  Normal S1 and S2 without S3 or S4.  There are no murmurs, gallops or rubs.  Lungs:  Breathing is unlabored and patient is speaking full sentences with ease.  No stridor.  Auscultation of lung fields reveals normal air movement without rales, ronchi or crackles.    Abdomen:  No distention or pain on palpation. There is no palpable hepatomegaly or splenomegaly.  No palpable masses.  Skin:  No rashes, petechiae or purpura.  No areas of skin breakdown.  Musculoskeletal:  There are no grossly-evident joint effusions or deformities.  Range of motion about the shoulder, elbow, hips and knees is grossly normal.    Psychiatric:  Alert and oriented to person, place, time and situation.  Range of affect is appropriate.    Neurologic:   Strength 5/5 in upper and lower extremities. Gait is normal.   Extremities:  Appear well-perfused, there is no clubbing, edema or cyanosis.      Relevant Laboratory, radiology and pathology results:  Recent Results (from the past 24 hour(s))   Comprehensive Metabolic Panel    Collection Time: 12/27/18  8:23 AM   Result Value Ref Range    Sodium 138 135 - 145 mmol/L    Potassium 4.2 3.5 - 5.0 mmol/L    Chloride 104 98 - 107 mmol/L    Anion Gap 10 7 - 15 mmol/L    CO2 24.0 22.0 - 30.0 mmol/L    BUN 8 7 - 21 mg/dL    Creatinine 1.61 0.96 - 1.30 mg/dL    BUN/Creatinine Ratio 6     EGFR CKD-EPI Non-African American, Male 57 (L) >=60 mL/min/1.23m2    EGFR CKD-EPI African American, Male 42 >=60 mL/min/1.70m2    Glucose 142 70 - 179 mg/dL    Calcium 8.0 (L) 8.5 - 10.2 mg/dL    Albumin 3.2 (L) 3.5 - 5.0 g/dL    Total Protein 5.8 (L) 6.5 - 8.3 g/dL    Total Bilirubin 0.5 0.0 - 1.2 mg/dL    AST 15 (L) 19 - 55 U/L    ALT 11 <50 U/L    Alkaline Phosphatase 368 (H) 38 - 126 U/L   CBC w/ Differential    Collection Time: 12/27/18  8:23 AM   Result Value Ref Range    WBC 0.6 (L) 4.5 - 11.0 10*9/L    RBC 3.27 (L) 4.50 - 5.90 10*12/L    HGB 9.8 (L) 13.5 - 17.5 g/dL    HCT 04.5 (L) 40.9 - 53.0 %    MCV 85.0 80.0 - 100.0 fL    MCH 30.1 26.0 - 34.0 pg    MCHC 35.4 31.0 - 37.0 g/dL    RDW 81.1 (H) 91.4 - 15.0 %    MPV 9.5 7.0 - 10.0 fL    Platelet 55 (L) 150 - 440 10*9/L    Neutrophils % 4.6 %    Lymphocytes % 87.4 %    Monocytes % 2.1 %    Eosinophils % 0.2 %    Basophils % 0.6 %    Absolute Neutrophils 0.0 (LL) 2.0 - 7.5 10*9/L    Absolute Lymphocytes 0.5 (L)  1.5 - 5.0 10*9/L    Absolute Monocytes 0.0 (L) 0.2 - 0.8 10*9/L    Absolute Eosinophils 0.0 0.0 - 0.4 10*9/L    Absolute Basophils 0.0 0.0 - 0.1 10*9/L    Large Unstained Cells 5 (H) 0 - 4 %   CSF protein    Collection Time: 12/27/18 11:16 AM   Result Value Ref Range    Protein, CSF 87 (H) 15 - 45 mg/dL   Glucose, CSF    Collection Time: 12/27/18 11:16 AM   Result Value Ref Range    Glucose, CSF 53 50 - 75 mg/dL   Hempath Leukemia Flowcytometry, CSF    Collection Time: 12/27/18 11:16 AM   Result Value Ref Range    Tube # CSF Tube 1     Color, CSF Pink     Appearance, CSF Hazy     Supernatant Color (Spun), CSF Colorless     Spun Appearance, CSF Clear     Nucleated Cells, CSF 0 0 - 5 ul    RBC, CSF 2,850 (H) 0 ul    #Cells counted for Diff 43     Lymphs %, CSF 65.1 %    Mono/Macrophage %, CSF 30.2 %    Other Cells %, CSF 4.7    Hematopathology Order    Collection Time: 12/27/18 11:16 AM   Result Value Ref Range    Case Report       Surgical Pathology Report                         Case: [id number]                                 Authorizing Provider:  Avie Arenas,   Collected:           12/27/2018 1116                                     MD                                                                           Ordering Location:     [facility]      Received:            12/27/2018 1514                                     HOSPITAL                                                                     Pathologist:           Claudia Desanctis,  MD                                                                           Specimen:    CSF Final Diagnosis       Cerebrospinal fluid, cytospin review   -  Possible peripheral blood contamination with a rare blast identified (see Comment)  -  Insufficient cellularity for flow cytometric analysis         This electronic signature is attestation that the pathologist personally reviewed the submitted material(s) and the final diagnosis reflects that evaluation.      Comment       Given the bloody nature of this sample and identification of rare blasts in the peripheral blood, the rare immature in this sample may represent peripheral blood contamination.       Clinical History       The patient is a 68 year-old male with a history of acute myeloid leukemia with monocytic features and mutated NPM1.  Pathologist's review of the fluid cytospin and flow cytometry are requested.      Gross Description       Received:  A single tube of cerebrospinal fluid.  A Wright-Giemsa stained cytospin slide is received for review.  Flow cytometry is requested but is not performed due to the low cellularity of the specimen.    FLUID:  Cerebrospinal Fluid        Microscopic Description       Microscopic examination is performed by Dr. Ericka Pontiff and substantiates the above diagnosis.          Resident Physician: None Assigned      EMBEDDED IMAGES      Disclaimer       Unless otherwise specified, specimens are preserved using 10% neutral buffered formalin. For cases in which immunohistochemical and/or in-situ hybridization stains are performed, the following statement applies: Appropriate controls for each stain (positive controls with or without negative controls) have been evaluated and stain as expected. These stains have not been separately validated for use on decalcified specimens and should be interpreted with caution in that setting. Some of the reagents used for these stains may be classified as analyte specific reagents (ASR). Tests using ASRs were developed, and their performance characteristics were determined, by the Anatomic Pathology Department [facility]). They have not been cleared or approved by the [place] Food and Drug Administration (FDA). The FDA does not require these tests to go through premarket FDA review. These tests are used for clinical purposes. They should not be regarded as investigational or for research. This laboratory is certified under the Clinical Laboratory Improvement Amendments (CLIA) as qualified to perform high complexity clinical laboratory testing.                   Cindra Presume, MD

## 2018-12-27 NOTE — Unmapped (Signed)
Lab results reviewed-patient released for IT chemo appointment in fluoroscopy. Chemo released after cosign from two RNs.

## 2018-12-28 NOTE — Unmapped (Signed)
I discussed the plan with Dr. Vertell Limber yesterday and spoke with PharmD who works with his [facility] to ensure the plan is confirmed on their end.    She was able to confirm for me that:  - Venetoclax will be discontinued  - Micafungin will run through 1/29  - On 1/30 patient will start posaconazole 300 mg daily    We discussed posaconazole access - she was able to run posaconazole and see that insurance covers it. They will be able to get drug and start administrating it on 1/30.       Manfred Arch, PharmD, BCOP, CPP  Pager: [phone]

## 2019-01-02 ENCOUNTER — Other Ambulatory Visit: Admit: 2019-01-02 | Discharge: 2019-01-03 | Payer: MEDICARE

## 2019-01-02 ENCOUNTER — Ambulatory Visit: Admit: 2019-01-02 | Discharge: 2019-01-03 | Payer: MEDICARE

## 2019-01-02 DIAGNOSIS — C9200 Acute myeloblastic leukemia, not having achieved remission: Secondary | ICD-10-CM | POA: Diagnosis not present

## 2019-01-02 DIAGNOSIS — E785 Hyperlipidemia, unspecified: Secondary | ICD-10-CM | POA: Diagnosis not present

## 2019-01-02 DIAGNOSIS — R110 Nausea: Secondary | ICD-10-CM | POA: Diagnosis not present

## 2019-01-02 DIAGNOSIS — I10 Essential (primary) hypertension: Secondary | ICD-10-CM | POA: Diagnosis not present

## 2019-01-02 DIAGNOSIS — Z794 Long term (current) use of insulin: Secondary | ICD-10-CM | POA: Diagnosis not present

## 2019-01-02 DIAGNOSIS — E119 Type 2 diabetes mellitus without complications: Secondary | ICD-10-CM | POA: Diagnosis not present

## 2019-01-02 DIAGNOSIS — Z79899 Other long term (current) drug therapy: Secondary | ICD-10-CM | POA: Diagnosis not present

## 2019-01-02 DIAGNOSIS — R42 Dizziness and giddiness: Secondary | ICD-10-CM | POA: Diagnosis not present

## 2019-01-02 LAB — CBC W/ AUTO DIFF
BASOPHILS ABSOLUTE COUNT: 0 10*9/L (ref 0.0–0.1)
BASOPHILS RELATIVE PERCENT: 0.4 %
EOSINOPHILS ABSOLUTE COUNT: 0 10*9/L (ref 0.0–0.4)
EOSINOPHILS RELATIVE PERCENT: 0.1 %
HEMATOCRIT: 24 % — ABNORMAL LOW (ref 41.0–53.0)
HEMOGLOBIN: 8 g/dL — ABNORMAL LOW (ref 13.5–17.5)
LARGE UNSTAINED CELLS: 35 % — ABNORMAL HIGH (ref 0–4)
LYMPHOCYTES ABSOLUTE COUNT: 1.3 10*9/L — ABNORMAL LOW (ref 1.5–5.0)
LYMPHOCYTES RELATIVE PERCENT: 52.9 %
MEAN CORPUSCULAR HEMOGLOBIN CONC: 33.3 g/dL (ref 31.0–37.0)
MEAN CORPUSCULAR HEMOGLOBIN: 28 pg (ref 26.0–34.0)
MEAN CORPUSCULAR VOLUME: 84.2 fL (ref 80.0–100.0)
MEAN PLATELET VOLUME: 10.1 fL — ABNORMAL HIGH (ref 7.0–10.0)
MONOCYTES ABSOLUTE COUNT: 0.1 10*9/L — ABNORMAL LOW (ref 0.2–0.8)
NEUTROPHILS ABSOLUTE COUNT: 0.2 10*9/L — CL (ref 2.0–7.5)
NEUTROPHILS RELATIVE PERCENT: 6.5 %
NUCLEATED RED BLOOD CELLS: 4 /100{WBCs} (ref <=4)
PLATELET COUNT: 30 10*9/L — ABNORMAL LOW (ref 150–440)
RED BLOOD CELL COUNT: 2.85 10*12/L — ABNORMAL LOW (ref 4.50–5.90)
RED CELL DISTRIBUTION WIDTH: 14.8 % (ref 12.0–15.0)

## 2019-01-02 LAB — COMPREHENSIVE METABOLIC PANEL
ALBUMIN: 3.4 g/dL — ABNORMAL LOW (ref 3.5–5.0)
ALKALINE PHOSPHATASE: 630 U/L — ABNORMAL HIGH (ref 38–126)
ALT (SGPT): 10 U/L (ref <50)
ANION GAP: 17 mmol/L — ABNORMAL HIGH (ref 7–15)
AST (SGOT): 27 U/L (ref 19–55)
BILIRUBIN TOTAL: 0.4 mg/dL (ref 0.0–1.2)
BLOOD UREA NITROGEN: 13 mg/dL (ref 7–21)
CALCIUM: 8.4 mg/dL — ABNORMAL LOW (ref 8.5–10.2)
CHLORIDE: 98 mmol/L (ref 98–107)
CO2: 23 mmol/L (ref 22.0–30.0)
CREATININE: 1.41 mg/dL — ABNORMAL HIGH (ref 0.70–1.30)
EGFR CKD-EPI AA MALE: 59 mL/min/{1.73_m2} — ABNORMAL LOW (ref >=60)
EGFR CKD-EPI NON-AA MALE: 51 mL/min/{1.73_m2} — ABNORMAL LOW (ref >=60)
GLUCOSE RANDOM: 221 mg/dL — ABNORMAL HIGH (ref 70–179)
POTASSIUM: 4.1 mmol/L (ref 3.5–5.0)
PROTEIN TOTAL: 6.1 g/dL — ABNORMAL LOW (ref 6.5–8.3)

## 2019-01-02 LAB — PATHOLOGIST SMEAR INTERPRETATION

## 2019-01-02 LAB — SMEAR REVIEW

## 2019-01-02 LAB — LYMPHOCYTES RELATIVE PERCENT: Lab: 52.9

## 2019-01-02 LAB — SODIUM: Sodium:SCnc:Pt:Ser/Plas:Qn:: 138

## 2019-01-02 MED ORDER — SEVELAMER CARBONATE 800 MG TABLET
ORAL_TABLET | Freq: Three times a day (TID) | ORAL | 0 refills | 0.00000 days | Status: CP
Start: 2019-01-02 — End: 2019-01-15

## 2019-01-02 NOTE — Unmapped (Addendum)
I would like for you to take a nausea tablet an hour to 30 minutes before your meal to see if it can help you keep food down.     We will give you IV fluids today.   You will get a blood transfusion tomorrow.   We will plan on you consenting for the clinical trial tomorrow while you're here getting blood.     I would like for you to increase your Remeron to 15mg  every night    I would like for you to have a lab check on Sunday 2/2 and then come back to see me on thurs. 2/6    For appointments or questions Monday through Friday 8AM-5PM please call [phone] or Toll Free [phone].  For Medical questions or concerns ask for the Nurse Triage Line.  On Nights, Weekends, and Holidays call [phone] and ask for the Oncology Fellow on Call.    If you feel you have an emergency you should call 911 or proceed to the ER        Lab on 01/02/2019   Component Date Value Ref Range Status   ??? Sodium 01/02/2019 138  135 - 145 mmol/L Final   ??? Potassium 01/02/2019 4.1  3.5 - 5.0 mmol/L Final   ??? Chloride 01/02/2019 98  98 - 107 mmol/L Final   ??? Anion Gap 01/02/2019 17* 7 - 15 mmol/L Final   ??? CO2 01/02/2019 23.0  22.0 - 30.0 mmol/L Final   ??? BUN 01/02/2019 13  7 - 21 mg/dL Final   ??? Creatinine 01/02/2019 1.41* 0.70 - 1.30 mg/dL Final   ??? BUN/Creatinine Ratio 01/02/2019 9   Final   ??? EGFR CKD-EPI Non-African American,* 01/02/2019 51* >=60 mL/min/1.55m2 Final   ??? EGFR CKD-EPI African American, Male 01/02/2019 59* >=60 mL/min/1.57m2 Final   ??? Glucose 01/02/2019 221* 70 - 179 mg/dL Final   ??? Calcium 69/62/9528 8.4* 8.5 - 10.2 mg/dL Final   ??? Albumin 41/32/4401 3.4* 3.5 - 5.0 g/dL Final   ??? Total Protein 01/02/2019 6.1* 6.5 - 8.3 g/dL Final   ??? Total Bilirubin 01/02/2019 0.4  0.0 - 1.2 mg/dL Final   ??? AST 02/72/5366 27  19 - 55 U/L Final   ??? ALT 01/02/2019 10  <50 U/L Final   ??? Alkaline Phosphatase 01/02/2019 630* 38 - 126 U/L Final   ??? WBC 01/02/2019 2.4* 4.5 - 11.0 10*9/L Preliminary   ??? RBC 01/02/2019 2.85* 4.50 - 5.90 10*12/L Preliminary   ??? HGB 01/02/2019 8.0* 13.5 - 17.5 g/dL Preliminary   ??? HCT 01/02/2019 24.0* 41.0 - 53.0 % Preliminary   ??? MCV 01/02/2019 84.2  80.0 - 100.0 fL Preliminary   ??? MCH 01/02/2019 28.0  26.0 - 34.0 pg Preliminary   ??? MCHC 01/02/2019 33.3  31.0 - 37.0 g/dL Preliminary   ??? RDW 01/02/2019 14.8  12.0 - 15.0 % Preliminary   ??? MPV 01/02/2019 10.1* 7.0 - 10.0 fL Preliminary   ??? Platelet 01/02/2019 30* 150 - 440 10*9/L Preliminary   ??? Hyperchromasia 01/02/2019 Slight* Not Present Preliminary   ??? Pathologist Smear Interpretation 01/02/2019 Confirmed by [facility] Specialist/Senior Tech   Final

## 2019-01-02 NOTE — Unmapped (Signed)
Labs drawn and sent for analysis.  Care provided by  Mickeal Needy, RN.  Heme Onc charge RN Olena Leatherwood aware.

## 2019-01-03 ENCOUNTER — Other Ambulatory Visit: Admit: 2019-01-03 | Discharge: 2019-01-04 | Payer: MEDICARE

## 2019-01-03 ENCOUNTER — Ambulatory Visit: Admit: 2019-01-03 | Discharge: 2019-01-04 | Payer: MEDICARE

## 2019-01-03 DIAGNOSIS — C9200 Acute myeloblastic leukemia, not having achieved remission: Secondary | ICD-10-CM | POA: Diagnosis not present

## 2019-01-03 LAB — CBC W/ AUTO DIFF
BASOPHILS ABSOLUTE COUNT: 0 10*9/L (ref 0.0–0.1)
BASOPHILS RELATIVE PERCENT: 0.5 %
EOSINOPHILS ABSOLUTE COUNT: 0 10*9/L (ref 0.0–0.4)
EOSINOPHILS RELATIVE PERCENT: 0 %
HEMOGLOBIN: 7.8 g/dL — ABNORMAL LOW (ref 13.5–17.5)
LARGE UNSTAINED CELLS: 51 % — ABNORMAL HIGH (ref 0–4)
LYMPHOCYTES ABSOLUTE COUNT: 1.3 10*9/L — ABNORMAL LOW (ref 1.5–5.0)
LYMPHOCYTES RELATIVE PERCENT: 36.6 %
MEAN CORPUSCULAR HEMOGLOBIN CONC: 35.3 g/dL (ref 31.0–37.0)
MEAN CORPUSCULAR HEMOGLOBIN: 29.5 pg (ref 26.0–34.0)
MEAN CORPUSCULAR VOLUME: 83.4 fL (ref 80.0–100.0)
MEAN PLATELET VOLUME: 10.6 fL — ABNORMAL HIGH (ref 7.0–10.0)
MONOCYTES ABSOLUTE COUNT: 0.3 10*9/L (ref 0.2–0.8)
MONOCYTES RELATIVE PERCENT: 6.9 %
NEUTROPHILS ABSOLUTE COUNT: 0.2 10*9/L — CL (ref 2.0–7.5)
NEUTROPHILS RELATIVE PERCENT: 5.2 %
PLATELET COUNT: 27 10*9/L — ABNORMAL LOW (ref 150–440)
RED BLOOD CELL COUNT: 2.63 10*12/L — ABNORMAL LOW (ref 4.50–5.90)
RED CELL DISTRIBUTION WIDTH: 14.3 % (ref 12.0–15.0)
WBC ADJUSTED: 3.6 10*9/L — ABNORMAL LOW (ref 4.5–11.0)

## 2019-01-03 LAB — MONOCYTES RELATIVE PERCENT: Lab: 6.9

## 2019-01-03 LAB — COMPREHENSIVE METABOLIC PANEL
ALBUMIN: 3.2 g/dL — ABNORMAL LOW (ref 3.5–5.0)
ALKALINE PHOSPHATASE: 772 U/L — ABNORMAL HIGH (ref 38–126)
ALT (SGPT): 9 U/L (ref <50)
ANION GAP: 12 mmol/L (ref 7–15)
AST (SGOT): 44 U/L (ref 19–55)
BLOOD UREA NITROGEN: 19 mg/dL (ref 7–21)
CALCIUM: 8.2 mg/dL — ABNORMAL LOW (ref 8.5–10.2)
CHLORIDE: 103 mmol/L (ref 98–107)
CO2: 22 mmol/L (ref 22.0–30.0)
CREATININE: 1.59 mg/dL — ABNORMAL HIGH (ref 0.70–1.30)
EGFR CKD-EPI AA MALE: 51 mL/min/{1.73_m2} — ABNORMAL LOW (ref >=60)
EGFR CKD-EPI NON-AA MALE: 44 mL/min/{1.73_m2} — ABNORMAL LOW (ref >=60)
GLUCOSE RANDOM: 138 mg/dL (ref 70–179)
POTASSIUM: 4.3 mmol/L (ref 3.5–5.0)
PROTEIN TOTAL: 5.8 g/dL — ABNORMAL LOW (ref 6.5–8.3)
SODIUM: 137 mmol/L (ref 135–145)

## 2019-01-03 LAB — ALT (SGPT): Alanine aminotransferase:CCnc:Pt:Ser/Plas:Qn:: 9

## 2019-01-03 LAB — SMEAR REVIEW

## 2019-01-03 NOTE — Unmapped (Signed)
.  Pt presents for hydration. Cr is 1.48. of note, Hgb/Hct 8.0/24.0.  Will be returning in 24hrs for PRBC's, per charge nurse.    1705 Report given to Aigul B, RN. Pt in NAD, pt aware of handoff and plan of care, no questions, no complaints voiced by patient at time of change of care givers

## 2019-01-03 NOTE — Unmapped (Signed)
Assumed patient care from Josem Kaufmann, RN at [phone]. Pt in NAD, no complaints voiced, aware of treatment plan with no questions.    IVF hydration completed. Line flushed with NS, heparinized. AVS is given to patient. Pt was discharged from clinic in NAD, via wheelchair, accompanied by family.

## 2019-01-03 NOTE — Unmapped (Signed)
Labs drawn and sent for analysis.  Care provided by  T Fallecker, RN

## 2019-01-03 NOTE — Unmapped (Signed)
[facility] Follow-up    Patient Name: Kyle Huffman  Patient Age: 68 y.o.  Encounter Date: 01/02/2019    Primary Care Provider:  Lauro Regulus    Referring Physician:  Lauro Regulus  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  Adverse-risk, s/p 2 cycles of Azacitidine + Venetoclax without response and persistent pancytopenia.  Given his monocytic phenotype, he received an LP with IT chemo on 1/15 that was equivocal.  He had repeat LP on 1/23 that was a bloody sample.  A rare blast was identified and thought to be contaminant from peripheral blood.  There was insufficient cellularity for flow analysis.  Given all this, we do not believe that he has CNS disease.   Kyle Huffman presents to clinic for follow up.     Kyle Huffman and his brother had an extensive conversation last week with Dr. Vertell Limber regarding his options at this time for his AML.  Conventional care vs clinical trial on TPI-202, a study of alvocidib +/- LDAC for patients that have failed to respond or relapsed on first line therapy of aza/venetoclax.  Kyle Huffman and his brother are interested in the clinical trial and are prepared to consent.  However, Kyle Huffman doesn't have his glasses and is unable to read the consent.  They will return tomorrow and plan to consent while here receiving PRBC transfusion.     Kyle Huffman resides in a SNF.  He has not been eating very much and his weight is down 6 lbs this week.  He describes nausea with eating and loss of appetite.  He is also having some dizziness, light-headedness, and nausea with standing.  His hgb is low today and will plan on PRBC transfusion.  His creatinine is also elevated today to 1.41 from 1.28, along with increase in anion gap and alk phos.  I suspect all this is from dehydration given his weight loss.  Will plan for 1L IVF today and repeat labs tomorrow.     Plan and Recommendations:  - 1L IVF today  - RTC tomorrow for blood transfusion and consent to TPI-202  - Will try zofran before meals to see if improves nausea  - increase Remeron to 15mg  q night  - lab check on Sunday 2/2  - RTC in 1 wee for f/u with myself.     Dr. Vertell Limber was available    Arna Medici, AGNP-BC  Leukemia Research Nurse Practitioner  Hematology/Oncology Division  [facility]  01/02/2019      History of Present Illness:  Oncology History    Diagnosis: de novo AML    Presentation: Weakness, pancytopenia    Presenting WBC: 36,000    Diagnostic BM Biopsy: 10/26/2018    Markedly hypercellular bone marrow (>95%) involved by acute myeloid leukemia with monocytic differentiation (65% blasts by manual aspirate differential) (see Comment)     Flow cytometry:    Two populations are analyzed.     The first population of 9% cells is gated on the monocytic cell region. Cells within this region consist of both immature monocytes and maturing monocytes. Both populations express CD4, CD11b, CD11c, CD13, CD15, CD16+56, CD33,  CD38, CD64, CD71 and HLA-DR. The immature monocytes aberrantly express CD56 show bright CD4 and CD16+56 expression, and lack CD14.     The second population of 8% cells is gated on the lymphocyte region and is composed of 76% T cells (CD4: CD8 ratio is 1.6:1) without an aberrant immunophenotype, 12% polytypic B cells, and 10% NK cells.     Lastly, a discrete immature population that falls within the typical blast gate is not identified; CD34-positive cells represent <1% of total cells analyzed.     This immunophenotype is characteristic of aberrant immature and maturing monocytes, representing 9% of cells within the specimen of bone marrow.     Cytogenetics: 47,XY,+8[10]/46~47,sl,der(15)t(3;15)(q?21;q26.3)[3]/46,XY[7]    Myeloid Mutational Panel:   Variants of Known or Likely Clinical Significance   Gene  Transcript   Predicted Protein   VAF (%)    ASXL1  c.1934dupG  p.Gly646Trpfs  44    CBL  c.1111T>C  p.Tyr371His  43    NPM1  c.860_863dup  p.Trp288Cysfs  19    SRSF2  c.284C>A  p.Pro95His  15    SRSF2  c.284C>T  p.Pro95Leu  27    TET2  c.1318C>T  p.Gln440*  14    TET2  M.5784_6962XBM  p.Thr1554Serfs  30   ??TET2  c.5126_5142delinsAGACCATTA  p.Cys1709*  47   ??    Variants of Unknown Significance   Gene  Transcript  Predicted Protein  VAF (%)   ??ETV6  c.914A>G  p.Asn305Ser  29     Presented with culture-negative sepsis and Aortic Valve endocarditis    Treatment: Azacitidine + Venetoclax  - Cycle 1: 10/31/2018    Treatment c/b     Final Diagnosis   Date Value Ref Range Status   11/26/2018   Final    Bone marrow, right iliac, biopsy  -  Variably-cellular bone marrow (<10-40%) with persistent acute myeloid leukemia (47% blasts by manual touch preparation differential)        This electronic signature is attestation that the pathologist personally reviewed the submitted material(s) and the final diagnosis reflects that evaluation.        Cycle 2: Azacitidine + Venetoclax  11/29/2018    LP with IT cytarabine:  01/15  Cerebrospinal fluid, cytospin review and flow cytometry  -  Few immature-appearing mononuclear cells (2% by manual count); see Comment  ??  This electronic signature is attestation that the pathologist personally reviewed the submitted material(s) and the final diagnosis reflects that evaluation.   Electronically signed by Michelene Heady, MD on 12/20/2018 at 1641   Comment    A definitive blast population is not identified by flow cytometric analysis. While the immature appearing cells may represent a reactive phenomenon, they are very worrisome in the context of the patient's clinical history. A repeat sampling is recommended.      Final Diagnosis   Date Value Ref Range Status   12/25/2018   Final    Bone marrow, right iliac, aspiration and biopsy  -  Hypercellular bone marrow (95%) with persistent involvement by acute myeloid leukemia (86% blasts by manual touch prep differential)  -  Cytogenetic studies are pending.  -  NPM1 quantitation study is pending.    This electronic signature is attestation that the pathologist personally reviewed the submitted material(s) and the final diagnosis reflects that evaluation.        Final Diagnosis   Date Value Ref Range Status   12/27/2018   Final    Cerebrospinal fluid, cytospin review   -  Possible peripheral blood contamination with a rare blast identified (see Comment)  -  Insufficient cellularity for flow cytometric analysis         This electronic signature is attestation that the pathologist personally reviewed the submitted material(s) and the final diagnosis reflects that evaluation.      - Given the bloody nature of this sample and identification of rare blasts in the peripheral blood, the rare immature in this sample may represent peripheral blood contamination.     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         Interval History:  Since last seen here, Kyle Huffman has not been feeling well.  He is currently residing in a SNF and he presents today with is brother.  Since last week he has lost 6 lbs. He reports that he can't keep food down.  He tries to eat, but he ends up spitting it out and gagging.  He endorses some nausea with standing as well.  He has light-headedness and dizziness with standing.  He has not taken any anti nausea medicine.  He is sleeping okay.  His joints are achy as much as they were when he was on treatment but his knees have been bothering him today. This pain is at rest as well.  He denies any sob/cp/palpitaitons. He further denies any diarrhea, abdominal pain, fevers/chills.      Otherwise, he denies new constitutional symptoms such as anorexia, weight loss, night sweats or unexplained fevers.  Furthermore, he denies symptoms of marrow failure: unexplained bleeding or bruising, recurrent or unexplained intercurrent infections, dyspnea on exertion, lightheadedness, palpitations or chest pain.  There have been no new or unexplained pains or self-identified masses, swelling or enlarged lymph nodes.    Past Medical, Surgical and Family History were reviewed and pertinent updates were made in the Electronic Medical Record    Review of Systems:  Other than as reported above in the interim history, the balance of a full 12-system review was performed and unremarkable.    ECOG Performance Status: 2    Past Medical History:  Past Medical History:   Diagnosis Date   ??? Diabetes mellitus (CMS-[name])    ??? Hyperlipidemia    ??? Hypertension    ??? Stroke (CMS-[name])        Medications:    Current Outpatient Medications   Medication Sig Dispense Refill   ??? amLODIPine (NORVASC) 10 MG tablet Take 1 tablet (10 mg total) by mouth daily. 90 tablet 3   ??? insulin lispro (HUMALOG) 100 unit/mL injection Inject 0-0.12 mL (0-12 Units total) under the skin Four (4) times a day (before meals and nightly). See discharge summary for titration. 20 mL 0   ??? levoFLOXacin (LEVAQUIN) 250 MG tablet Take  1 tablet (250 mg total) by mouth daily. 30 tablet 3   ??? mirtazapine (REMERON) 7.5 MG tablet Take 1 tablet (7.5 mg total) by mouth nightly. 90 tablet 3   ??? OLANZapine zydis (ZYPREXA) 5 MG disintegrating tablet Take 0.5 tablets (2.5 mg total) by mouth nightly. 15 tablet 0   ??? ondansetron (ZOFRAN-ODT) 4 MG disintegrating tablet Take 1 tablet (4 mg total) by mouth every eight (8) hours as needed for nausea. 30 tablet 0   ??? ONETOUCH VERIO Strp      ??? posaconazole (NOXAFIL) 100 mg TbEC delayed released tablet Take 3 TABLETS (300 mg) by mouth daily. 90 tablet 3   ??? valACYclovir (VALTREX) 500 MG tablet Take 1 tablet (500 mg total) by mouth daily.  0   ??? insulin glargine (LANTUS) 100 unit/mL injection Inject 0.15 mL (15 Units total) under the skin nightly. 4.5 mL 0   ??? pravastatin (PRAVACHOL) 10 MG tablet Take 1 tablet (10 mg total) by mouth nightly. 30 tablet 0     No current facility-administered medications for this visit.      Facility-Administered Medications Ordered in Other Visits   Medication Dose Route Frequency Provider Last Rate Last Dose   ??? heparin, porcine (PF) 100 unit/mL injection 200 Units  200 Units Intravenous Q30 Min PRN Thyra Breed, NP   200 Units at 01/02/19 1323   ??? sodium chloride 0.9% (NS BOLUS) bolus 1,000 mL  1,000 mL Intravenous Once [employer], [place]         Vital Signs:  BSA: 1.73 meters squared  Vitals:    01/02/19 1356   BP: 107/63   Pulse: 114   Resp: 17   Temp: 36.8 ??C (98.2 ??F)   SpO2: 100%     Physical Exam:  Objective:   Physical Exam: General: Resting in no apparent distress, pale and cachetic.   HEENT:  Pupils are equal, round and reactive to light and accomodation.  There is no scleral icterus and no conjunctival injection.  The oral mucosa does not demonstrate ulceration, erythema, exudate or purpura.    Heart:  Regular rate and rhythm.  Normal S1 and S2. There are no murmurs, gallops or rubs.  No edema noted  Lungs:  Breathing is unlabored and patient is speaking full sentences with ease.   Auscultation of lung fields reveals normal air movement without rales, ronchi or crackles.    Abdomen:  No distention or pain on palpation. There is no palpable hepatomegaly or splenomegaly.  No palpable masses.  BS x 4.  Skin:  No rashes, petechiae or purpura. Grossly intact  Musculoskeletal:  There are no grossly-evident joint effusions or deformities.  Range of motion about the shoulder, elbow, hips and knees is grossly normal.    Psychiatric:  Range of affect is appropriate.    Neurologic:   Gait is steady.  Alert and oriented x 4    Relevant Laboratory, radiology and pathology results:  Recent Results (from the past 24 hour(s))   Comprehensive Metabolic Panel    Collection Time: 01/02/19  1:32 PM   Result Value Ref Range    Sodium 138 135 - 145 mmol/L    Potassium 4.1 3.5 - 5.0 mmol/L    Chloride 98 98 - 107 mmol/L    Anion Gap 17 (H) 7 - 15 mmol/L    CO2 23.0 22.0 - 30.0 mmol/L    BUN 13 7 - 21 mg/dL    Creatinine 1.61 (H) 0.70 - 1.30 mg/dL  BUN/Creatinine Ratio 9     EGFR CKD-EPI Non-African American, Male 51 (L) >=60 mL/min/1.74m2    EGFR CKD-EPI African American, Male 47 (L) >=60 mL/min/1.6m2    Glucose 221 (H) 70 - 179 mg/dL    Calcium 8.4 (L) 8.5 - 10.2 mg/dL    Albumin 3.4 (L) 3.5 - 5.0 g/dL    Total Protein 6.1 (L) 6.5 - 8.3 g/dL    Total Bilirubin 0.4 0.0 - 1.2 mg/dL    AST 27 19 - 55 U/L    ALT 10 <50 U/L    Alkaline Phosphatase 630 (H) 38 - 126 U/L   CBC w/ Differential    Collection Time: 01/02/19  1:32 PM   Result Value Ref Range    WBC 2.4 (L) 4.5 - 11.0 10*9/L    RBC 2.85 (L) 4.50 - 5.90 10*12/L    HGB 8.0 (L) 13.5 - 17.5 g/dL    HCT 16.1 (L) 09.6 - 53.0 %    MCV 84.2 80.0 - 100.0 fL    MCH 28.0 26.0 - 34.0 pg    MCHC 33.3 31.0 - 37.0 g/dL    RDW 04.5 40.9 - 81.1 %    MPV 10.1 (H) 7.0 - 10.0 fL    Platelet 30 (L) 150 - 440 10*9/L    nRBC 4 <=4 /100 WBCs    Neutrophils % 6.5 %    Lymphocytes % 52.9 %    Monocytes % 4.7 %    Eosinophils % 0.1 %    Basophils % 0.4 %    Absolute Neutrophils 0.2 (LL) 2.0 - 7.5 10*9/L    Absolute Lymphocytes 1.3 (L) 1.5 - 5.0 10*9/L Absolute Monocytes 0.1 (L) 0.2 - 0.8 10*9/L    Absolute Eosinophils 0.0 0.0 - 0.4 10*9/L    Absolute Basophils 0.0 0.0 - 0.1 10*9/L    Large Unstained Cells 35 (H) 0 - 4 %    Hyperchromasia Slight (A) Not Present   Pathologist Smear Review    Collection Time: 01/02/19  1:32 PM   Result Value Ref Range    Pathologist Smear Interpretation Confirmed by Hemepath Specialist/Senior Tech    Morphology Review    Collection Time: 01/02/19  1:32 PM   Result Value Ref Range    Smear Review Comments See Comment (A) Undefined

## 2019-01-03 NOTE — Unmapped (Signed)
Results for Kyle Huffman, Kyle Huffman (MRN [id number]) as of 01/02/2019 16:49   Ref. Range 01/02/2019 13:32 01/02/2019 15:23   WBC Latest Ref Range: 4.5 - 11.0 10*9/L 2.4 (L)    RBC Latest Ref Range: 4.50 - 5.90 10*12/L 2.85 (L)    HGB Latest Ref Range: 13.5 - 17.5 g/dL 8.0 (L)    HCT Latest Ref Range: 41.0 - 53.0 % 24.0 (L)    MCV Latest Ref Range: 80.0 - 100.0 fL 84.2    MCH Latest Ref Range: 26.0 - 34.0 pg 28.0    MCHC Latest Ref Range: 31.0 - 37.0 g/dL 81.1    RDW Latest Ref Range: 12.0 - 15.0 % 14.8    MPV Latest Ref Range: 7.0 - 10.0 fL 10.1 (H)    Platelet Latest Ref Range: 150 - 440 10*9/L 30 (L)    nRBC Latest Ref Range: <=4 /100 WBCs 4    Neutrophils % Latest Units: % 6.5    Lymphocytes % Latest Units: % 52.9    Monocytes % Latest Units: % 4.7    Eosinophils % Latest Units: % 0.1    Basophils % Latest Units: % 0.4    Absolute Neutrophils Latest Ref Range: 2.0 - 7.5 10*9/L 0.2 (LL)    Absolute Lymphocytes Latest Ref Range: 1.5 - 5.0 10*9/L 1.3 (L)    Absolute Monocytes  Latest Ref Range: 0.2 - 0.8 10*9/L 0.1 (L)    Absolute Eosinophils Latest Ref Range: 0.0 - 0.4 10*9/L 0.0    Absolute Basophils  Latest Ref Range: 0.0 - 0.1 10*9/L 0.0    Hyperchromasia Latest Ref Range: Not Present  Slight (A)    Smear Review Latest Ref Range: Undefined  See Comment (A)    Pathologist Smear Interpretation Unknown Confirmed by Hemepath Specialist/Senior Tech    Large Unstained Cells Latest Ref Range: 0 - 4 % 35 (H)    Sodium Latest Ref Range: 135 - 145 mmol/L 138    Potassium Latest Ref Range: 3.5 - 5.0 mmol/L 4.1    Chloride Latest Ref Range: 98 - 107 mmol/L 98    CO2 Latest Ref Range: 22.0 - 30.0 mmol/L 23.0    Bun Latest Ref Range: 7 - 21 mg/dL 13    Creatinine Latest Ref Range: 0.70 - 1.30 mg/dL 9.14 (H)    BUN/Creatinine Ratio Unknown 9    EGFR CKD-EPI African American, Male Latest Ref Range: >=60 mL/min/1.71m2 59 (L)    EGFR CKD-EPI Non-African American, Male Latest Ref Range: >=60 mL/min/1.78m2 51 (L)    Anion Gap Latest Ref Range: 7 - 15 mmol/L 17 (H)    Glucose Latest Ref Range: 70 - 179 mg/dL 782 (H)    Calcium Latest Ref Range: 8.5 - 10.2 mg/dL 8.4 (L)    Albumin Latest Ref Range: 3.5 - 5.0 g/dL 3.4 (L)    Total Protein Latest Ref Range: 6.5 - 8.3 g/dL 6.1 (L)    Total Bilirubin Latest Ref Range: 0.0 - 1.2 mg/dL 0.4    AST Latest Ref Range: 19 - 55 U/L 27    ALT Latest Ref Range: <50 U/L 10    Alkaline Phosphatase Latest Ref Range: 38 - 126 U/L 630 (H)    ABO Grouping Unknown  A POS     If you feel like you're having an emergency please call 911.  For appointments or questions Monday through Friday 8AM-5PM please call [phone] or Toll Free [phone]. For Medical questions or concerns ask for the Nurse Triage Line.  On Nights, Weekends, and Holidays call [phone] and ask for the Oncologist on Call.  Reasons to call the Nurse Triage Line:  Fever of 100.5 or greater  Nausea and/or vomiting not relived with nausea medicine  Diarrhea or constipation  Severe pain not relieved with usual pain regimen  Shortness of breath  Uncontrolled bleeding  Mental status changes

## 2019-01-04 NOTE — Unmapped (Signed)
x2 units of PRBC/ hydration infused uneventfully via PICC & heperinized. AVS given & ensured return appointment in placed & Pt d/c in w/c escorted by brother.

## 2019-01-04 NOTE — Unmapped (Signed)
If you feel like this is an emergency please call 911.  For appointments or questions Monday through Friday 8AM-5PM please call [phone] or Toll Free [phone]. For Medical questions or concerns ask for the Nurse Triage Line.  On Nights, Weekends, and Holidays call [phone] and ask for the Oncologist on Call.  Reasons to call the Nurse Triage Line:  Fever of 100.5 or greater  Nausea and/or vomiting not relived with nausea medicine  Diarrhea or constipation  Severe pain not relieved with usual pain regimen  Shortness of breath  Uncontrolled bleeding  Mental status changes  Hospital Outpatient Visit on 01/03/2019   Component Date Value Ref Range Status   ??? Crossmatch 01/03/2019 Compatible   Final   ??? Unit Blood Type 01/03/2019 A Pos   Final   ??? ISBT Number 01/03/2019 [id number]   Final   ??? Unit # 01/03/2019 [id number]   Final   ??? Status 01/03/2019 Issued   Final   ??? Spec Expiration 01/03/2019 [id number]   Final   ??? Product ID 01/03/2019 Red Blood Cells   Final   ??? PRODUCT CODE 01/03/2019 [id number]   Final   ??? Crossmatch 01/03/2019 Compatible   Final   ??? Unit Blood Type 01/03/2019 A Pos   Final   ??? ISBT Number 01/03/2019 6200   Final   ??? Unit # 01/03/2019 [id number]   Final   ??? Status 01/03/2019 Issued   Final   ??? Spec Expiration 01/03/2019 [id number]   Final   ??? Product ID 01/03/2019 Red Blood Cells   Final   ??? PRODUCT CODE 01/03/2019 [id number]   Final   Lab on 01/03/2019   Component Date Value Ref Range Status   ??? Sodium 01/03/2019 137  135 - 145 mmol/L Final   ??? Potassium 01/03/2019 4.3  3.5 - 5.0 mmol/L Final   ??? Chloride 01/03/2019 103  98 - 107 mmol/L Final   ??? Anion Gap 01/03/2019 12  7 - 15 mmol/L Final   ??? CO2 01/03/2019 22.0  22.0 - 30.0 mmol/L Final   ??? BUN 01/03/2019 19  7 - 21 mg/dL Final   ??? Creatinine 01/03/2019 1.59* 0.70 - 1.30 mg/dL Final   ??? BUN/Creatinine Ratio 01/03/2019 12   Final   ??? EGFR CKD-EPI Non-African American,* 01/03/2019 44* >=60 mL/min/1.49m2 Final   ??? EGFR CKD-EPI African American, Male 01/03/2019 51* >=60 mL/min/1.11m2 Final   ??? Glucose 01/03/2019 138  70 - 179 mg/dL Final   ??? Calcium 16/12/930 8.2* 8.5 - 10.2 mg/dL Final   ??? Albumin 35/57/3220 3.2* 3.5 - 5.0 g/dL Final   ??? Total Protein 01/03/2019 5.8* 6.5 - 8.3 g/dL Final   ??? Total Bilirubin 01/03/2019 0.3  0.0 - 1.2 mg/dL Final   ??? AST 25/42/7062 44  19 - 55 U/L Final   ??? ALT 01/03/2019 9  <50 U/L Final   ??? Alkaline Phosphatase 01/03/2019 772* 38 - 126 U/L Final   ??? WBC 01/03/2019 3.6* 4.5 - 11.0 10*9/L Final   ??? RBC 01/03/2019 2.63* 4.50 - 5.90 10*12/L Final   ??? HGB 01/03/2019 7.8* 13.5 - 17.5 g/dL Final   ??? HCT 37/62/8315 21.9* 41.0 - 53.0 % Final   ??? MCV 01/03/2019 83.4  80.0 - 100.0 fL Final   ??? MCH 01/03/2019 29.5  26.0 - 34.0 pg Final   ??? MCHC 01/03/2019 35.3  31.0 - 37.0 g/dL Final   ??? RDW 17/61/6073 14.3  12.0 - 15.0 % Final   ??? MPV 01/03/2019  10.6* 7.0 - 10.0 fL Final   ??? Platelet 01/03/2019 27* 150 - 440 10*9/L Final   ??? Neutrophils % 01/03/2019 5.2  % Final   ??? Lymphocytes % 01/03/2019 36.6  % Final   ??? Monocytes % 01/03/2019 6.9  % Final   ??? Eosinophils % 01/03/2019 0.0  % Final   ??? Basophils % 01/03/2019 0.5  % Final   ??? Absolute Neutrophils 01/03/2019 0.2* 2.0 - 7.5 10*9/L Final   ??? Absolute Lymphocytes 01/03/2019 1.3* 1.5 - 5.0 10*9/L Final   ??? Absolute Monocytes 01/03/2019 0.3  0.2 - 0.8 10*9/L Final   ??? Absolute Eosinophils 01/03/2019 0.0  0.0 - 0.4 10*9/L Final   ??? Absolute Basophils 01/03/2019 0.0  0.0 - 0.1 10*9/L Final   ??? Large Unstained Cells 01/03/2019 51* 0 - 4 % Final    Blasts Present. Immature monocytes present.    ??? Smear Review Comments 01/03/2019 See Comment* Undefined Final    Blasts Present.  Immature monocytes present.  Myelocytes present-rare.  Promyelocytes present-rare.

## 2019-01-06 ENCOUNTER — Ambulatory Visit: Admit: 2019-01-06 | Discharge: 2019-01-07 | Payer: MEDICARE

## 2019-01-06 DIAGNOSIS — C9200 Acute myeloblastic leukemia, not having achieved remission: Secondary | ICD-10-CM | POA: Diagnosis not present

## 2019-01-06 LAB — CBC W/ AUTO DIFF
BASOPHILS ABSOLUTE COUNT: 0 10*9/L (ref 0.0–0.1)
BASOPHILS RELATIVE PERCENT: 0.4 %
EOSINOPHILS ABSOLUTE COUNT: 0 10*9/L (ref 0.0–0.4)
EOSINOPHILS RELATIVE PERCENT: 0.1 %
HEMATOCRIT: 28.4 % — ABNORMAL LOW (ref 41.0–53.0)
HEMOGLOBIN: 9.8 g/dL — ABNORMAL LOW (ref 13.5–17.5)
LARGE UNSTAINED CELLS: 57 % — ABNORMAL HIGH (ref 0–4)
LYMPHOCYTES ABSOLUTE COUNT: 1.8 10*9/L (ref 1.5–5.0)
LYMPHOCYTES RELATIVE PERCENT: 31.1 %
MEAN CORPUSCULAR HEMOGLOBIN CONC: 34.3 g/dL (ref 31.0–37.0)
MEAN CORPUSCULAR VOLUME: 86.2 fL (ref 80.0–100.0)
MONOCYTES RELATIVE PERCENT: 6.7 %
NEUTROPHILS ABSOLUTE COUNT: 0.3 10*9/L — CL (ref 2.0–7.5)
NEUTROPHILS RELATIVE PERCENT: 5.2 %
PLATELET COUNT: 16 10*9/L — ABNORMAL LOW (ref 150–440)
RED BLOOD CELL COUNT: 3.3 10*12/L — ABNORMAL LOW (ref 4.50–5.90)
RED CELL DISTRIBUTION WIDTH: 16.2 % — ABNORMAL HIGH (ref 12.0–15.0)
WBC ADJUSTED: 5.8 10*9/L (ref 4.5–11.0)

## 2019-01-06 LAB — ANISOCYTOSIS

## 2019-01-06 LAB — SMEAR REVIEW

## 2019-01-06 NOTE — Unmapped (Addendum)
If you feel like this is an emergency please call 911.  For appointments or questions Monday through Friday 8AM-5PM please call [phone] or Toll Free [phone]. For Medical questions or concerns ask for the Nurse Triage Line.  On Nights, Weekends, and Holidays call [phone] and ask for the Oncologist on Call.  Reasons to call the Nurse Triage Line:  Fever of 100.5 or greater  Nausea and/or vomiting not relived with nausea medicine  Diarrhea or constipation  Severe pain not relieved with usual pain regimen  Shortness of breath  Uncontrolled bleeding  Mental status changes    Hospital Outpatient Visit on 01/06/2019   Component Date Value Ref Range Status   ??? WBC 01/06/2019 5.8  4.5 - 11.0 10*9/L Preliminary   ??? RBC 01/06/2019 3.30* 4.50 - 5.90 10*12/L Preliminary   ??? HGB 01/06/2019 9.8* 13.5 - 17.5 g/dL Preliminary   ??? HCT 01/06/2019 28.4* 41.0 - 53.0 % Preliminary   ??? MCV 01/06/2019 86.2  80.0 - 100.0 fL Preliminary   ??? MCH 01/06/2019 29.6  26.0 - 34.0 pg Preliminary   ??? MCHC 01/06/2019 34.3  31.0 - 37.0 g/dL Preliminary   ??? RDW 01/06/2019 16.2* 12.0 - 15.0 % Preliminary   ??? MPV 01/06/2019 8.0  7.0 - 10.0 fL Preliminary   ??? Platelet 01/06/2019 16* 150 - 440 10*9/L Preliminary   ??? Microcytosis 01/06/2019 Slight* Not Present Preliminary   ??? Anisocytosis 01/06/2019 Slight* Not Present Preliminary

## 2019-01-06 NOTE — Unmapped (Signed)
Pt A&O x4, NAD.  Pt denies any complaints since last visit.  Resting comfortably.    Labs drawn and sent.      Labs outside parameters for transfusion - Hgb 9.8, plt 16.  No questions or concerns voiced.  Pt left infusion center NAD, via wheelchair escorted by family.

## 2019-01-07 DIAGNOSIS — C9300 Acute monoblastic/monocytic leukemia, not having achieved remission: Secondary | ICD-10-CM | POA: Diagnosis not present

## 2019-01-07 DIAGNOSIS — C7951 Secondary malignant neoplasm of bone: Secondary | ICD-10-CM | POA: Diagnosis not present

## 2019-01-10 ENCOUNTER — Ambulatory Visit: Admit: 2019-01-10 | Discharge: 2019-01-11 | Payer: MEDICARE

## 2019-01-10 ENCOUNTER — Ambulatory Visit: Admit: 2019-01-10 | Discharge: 2019-01-15 | Disposition: A | Discharge status: Hospice | Payer: MEDICARE

## 2019-01-10 ENCOUNTER — Ambulatory Visit (HOSPITAL_COMMUNITY)
Admission: AD | Admit: 2019-01-10 | Discharge: 2019-01-10 | Disposition: A | Discharge status: Home / Self Care | Payer: Medicare HMO | Source: Other Acute Inpatient Hospital | Attending: Emergency Medicine | Admitting: Emergency Medicine

## 2019-01-10 ENCOUNTER — Emergency Department: Payer: Medicare HMO

## 2019-01-10 ENCOUNTER — Other Ambulatory Visit: Payer: Self-pay

## 2019-01-10 ENCOUNTER — Emergency Department
Admission: EM | Admit: 2019-01-10 | Discharge: 2019-01-10 | Disposition: A | Discharge status: Other Acute Inpatient Hospital | Payer: Medicare HMO | Attending: Emergency Medicine | Admitting: Emergency Medicine

## 2019-01-10 DIAGNOSIS — R531 Weakness: Secondary | ICD-10-CM | POA: Diagnosis not present

## 2019-01-10 DIAGNOSIS — R4182 Altered mental status, unspecified: Secondary | ICD-10-CM | POA: Diagnosis not present

## 2019-01-10 DIAGNOSIS — W19XXXA Unspecified fall, initial encounter: Secondary | ICD-10-CM | POA: Diagnosis not present

## 2019-01-10 DIAGNOSIS — E279 Disorder of adrenal gland, unspecified: Secondary | ICD-10-CM | POA: Diagnosis not present

## 2019-01-10 DIAGNOSIS — M6281 Muscle weakness (generalized): Secondary | ICD-10-CM | POA: Diagnosis not present

## 2019-01-10 DIAGNOSIS — Y999 Unspecified external cause status: Secondary | ICD-10-CM | POA: Insufficient documentation

## 2019-01-10 DIAGNOSIS — J189 Pneumonia, unspecified organism: Secondary | ICD-10-CM | POA: Diagnosis not present

## 2019-01-10 DIAGNOSIS — Y939 Activity, unspecified: Secondary | ICD-10-CM | POA: Diagnosis not present

## 2019-01-10 DIAGNOSIS — E1142 Type 2 diabetes mellitus with diabetic polyneuropathy: Secondary | ICD-10-CM | POA: Diagnosis not present

## 2019-01-10 DIAGNOSIS — C9250 Acute myelomonocytic leukemia, not having achieved remission: Secondary | ICD-10-CM | POA: Diagnosis not present

## 2019-01-10 DIAGNOSIS — J984 Other disorders of lung: Secondary | ICD-10-CM | POA: Diagnosis not present

## 2019-01-10 DIAGNOSIS — D61818 Other pancytopenia: Secondary | ICD-10-CM | POA: Diagnosis not present

## 2019-01-10 DIAGNOSIS — A419 Sepsis, unspecified organism: Secondary | ICD-10-CM

## 2019-01-10 DIAGNOSIS — S199XXA Unspecified injury of neck, initial encounter: Secondary | ICD-10-CM | POA: Diagnosis not present

## 2019-01-10 DIAGNOSIS — Z515 Encounter for palliative care: Secondary | ICD-10-CM | POA: Diagnosis not present

## 2019-01-10 DIAGNOSIS — S065X0A Traumatic subdural hemorrhage without loss of consciousness, initial encounter: Secondary | ICD-10-CM | POA: Diagnosis not present

## 2019-01-10 DIAGNOSIS — D689 Coagulation defect, unspecified: Secondary | ICD-10-CM | POA: Diagnosis not present

## 2019-01-10 DIAGNOSIS — I129 Hypertensive chronic kidney disease with stage 1 through stage 4 chronic kidney disease, or unspecified chronic kidney disease: Secondary | ICD-10-CM | POA: Insufficient documentation

## 2019-01-10 DIAGNOSIS — J181 Lobar pneumonia, unspecified organism: Secondary | ICD-10-CM | POA: Diagnosis not present

## 2019-01-10 DIAGNOSIS — Z87891 Personal history of nicotine dependence: Secondary | ICD-10-CM | POA: Insufficient documentation

## 2019-01-10 DIAGNOSIS — R279 Unspecified lack of coordination: Secondary | ICD-10-CM | POA: Diagnosis not present

## 2019-01-10 DIAGNOSIS — C9200 Acute myeloblastic leukemia, not having achieved remission: Secondary | ICD-10-CM | POA: Diagnosis not present

## 2019-01-10 DIAGNOSIS — I6200 Nontraumatic subdural hemorrhage, unspecified: Secondary | ICD-10-CM | POA: Insufficient documentation

## 2019-01-10 DIAGNOSIS — D696 Thrombocytopenia, unspecified: Secondary | ICD-10-CM

## 2019-01-10 DIAGNOSIS — N183 Chronic kidney disease, stage 3 (moderate): Secondary | ICD-10-CM | POA: Diagnosis not present

## 2019-01-10 DIAGNOSIS — I6202 Nontraumatic subacute subdural hemorrhage: Secondary | ICD-10-CM | POA: Diagnosis not present

## 2019-01-10 DIAGNOSIS — D649 Anemia, unspecified: Secondary | ICD-10-CM | POA: Diagnosis not present

## 2019-01-10 DIAGNOSIS — D709 Neutropenia, unspecified: Secondary | ICD-10-CM | POA: Diagnosis not present

## 2019-01-10 DIAGNOSIS — R000 Tachycardia, unspecified: Secondary | ICD-10-CM | POA: Diagnosis not present

## 2019-01-10 DIAGNOSIS — Z794 Long term (current) use of insulin: Secondary | ICD-10-CM | POA: Diagnosis not present

## 2019-01-10 DIAGNOSIS — I700 Atherosclerosis of aorta: Secondary | ICD-10-CM | POA: Diagnosis not present

## 2019-01-10 DIAGNOSIS — S066X0A Traumatic subarachnoid hemorrhage without loss of consciousness, initial encounter: Secondary | ICD-10-CM | POA: Diagnosis not present

## 2019-01-10 DIAGNOSIS — I6203 Nontraumatic chronic subdural hemorrhage: Secondary | ICD-10-CM | POA: Diagnosis not present

## 2019-01-10 DIAGNOSIS — N179 Acute kidney failure, unspecified: Secondary | ICD-10-CM | POA: Diagnosis not present

## 2019-01-10 DIAGNOSIS — Y929 Unspecified place or not applicable: Secondary | ICD-10-CM | POA: Insufficient documentation

## 2019-01-10 DIAGNOSIS — S065X9A Traumatic subdural hemorrhage with loss of consciousness of unspecified duration, initial encounter: Secondary | ICD-10-CM | POA: Diagnosis not present

## 2019-01-10 DIAGNOSIS — I340 Nonrheumatic mitral (valve) insufficiency: Secondary | ICD-10-CM | POA: Diagnosis not present

## 2019-01-10 DIAGNOSIS — M47812 Spondylosis without myelopathy or radiculopathy, cervical region: Secondary | ICD-10-CM | POA: Diagnosis not present

## 2019-01-10 DIAGNOSIS — R590 Localized enlarged lymph nodes: Secondary | ICD-10-CM | POA: Diagnosis not present

## 2019-01-10 DIAGNOSIS — R5381 Other malaise: Secondary | ICD-10-CM | POA: Diagnosis not present

## 2019-01-10 DIAGNOSIS — S065XAA Traumatic subdural hemorrhage with loss of consciousness status unknown, initial encounter: Secondary | ICD-10-CM

## 2019-01-10 DIAGNOSIS — R918 Other nonspecific abnormal finding of lung field: Secondary | ICD-10-CM | POA: Diagnosis not present

## 2019-01-10 DIAGNOSIS — I517 Cardiomegaly: Secondary | ICD-10-CM | POA: Diagnosis not present

## 2019-01-10 DIAGNOSIS — W06XXXA Fall from bed, initial encounter: Secondary | ICD-10-CM | POA: Diagnosis not present

## 2019-01-10 DIAGNOSIS — J90 Pleural effusion, not elsewhere classified: Secondary | ICD-10-CM | POA: Diagnosis not present

## 2019-01-10 DIAGNOSIS — E1122 Type 2 diabetes mellitus with diabetic chronic kidney disease: Secondary | ICD-10-CM | POA: Insufficient documentation

## 2019-01-10 DIAGNOSIS — R7989 Other specified abnormal findings of blood chemistry: Secondary | ICD-10-CM | POA: Diagnosis not present

## 2019-01-10 DIAGNOSIS — N189 Chronic kidney disease, unspecified: Secondary | ICD-10-CM | POA: Diagnosis not present

## 2019-01-10 DIAGNOSIS — I349 Nonrheumatic mitral valve disorder, unspecified: Secondary | ICD-10-CM | POA: Diagnosis not present

## 2019-01-10 DIAGNOSIS — I313 Pericardial effusion (noninflammatory): Secondary | ICD-10-CM | POA: Diagnosis not present

## 2019-01-10 DIAGNOSIS — Q743 Arthrogryposis multiplex congenita: Principal | ICD-10-CM

## 2019-01-10 HISTORY — DX: Acute leukemia of unspecified cell type not having achieved remission: C95.00

## 2019-01-10 LAB — BASIC METABOLIC PANEL
Anion gap: 12 (ref 5–15)
BUN: 40 mg/dL — ABNORMAL HIGH (ref 8–23)
CO2: 19 mmol/L — ABNORMAL LOW (ref 22–32)
Calcium: 6.9 mg/dL — ABNORMAL LOW (ref 8.9–10.3)
Chloride: 102 mmol/L (ref 98–111)
Creatinine, Ser: 1.98 mg/dL — ABNORMAL HIGH (ref 0.61–1.24)
GFR calc Af Amer: 39 mL/min — ABNORMAL LOW (ref >60)
GFR calc non Af Amer: 33 mL/min — ABNORMAL LOW (ref >60)
Glucose, Bld: 162 mg/dL — ABNORMAL HIGH (ref 70–99)
Potassium: 4.5 mmol/L (ref 3.5–5.1)
Sodium: 133 mmol/L — ABNORMAL LOW (ref 135–145)

## 2019-01-10 LAB — HEPATIC FUNCTION PANEL
ALK PHOS: 324 U/L — AB (ref 38–126)
ALT: 72 U/L — ABNORMAL HIGH (ref 0–44)
AST: 176 U/L — ABNORMAL HIGH (ref 15–41)
Albumin: 2.1 g/dL — ABNORMAL LOW (ref 3.5–5.0)
Bilirubin, Direct: 0.2 mg/dL (ref 0.0–0.2)
Indirect Bilirubin: 0.6 mg/dL (ref 0.3–0.9)
Total Bilirubin: 0.8 mg/dL (ref 0.3–1.2)
Total Protein: 5.1 g/dL — ABNORMAL LOW (ref 6.5–8.1)

## 2019-01-10 LAB — PATHOLOGIST SMEAR REVIEW

## 2019-01-10 LAB — CBC WITH DIFFERENTIAL/PLATELET
Abs Immature Granulocytes: 0 10*3/uL (ref 0.00–0.07)
Basophils Absolute: 0 10*3/uL (ref 0.0–0.1)
Basophils Relative: 0 %
Eosinophils Absolute: 0 10*3/uL (ref 0.0–0.5)
Eosinophils Relative: 0 %
HCT: 17 % — ABNORMAL LOW (ref 39.0–52.0)
Hemoglobin: 5.8 g/dL — ABNORMAL LOW (ref 13.0–17.0)
Lymphocytes Relative: 45 %
Lymphs Abs: 6.1 10*3/uL — ABNORMAL HIGH (ref 0.7–4.0)
MCH: 28.9 pg (ref 26.0–34.0)
MCHC: 34.1 g/dL (ref 30.0–36.0)
MCV: 84.6 fL (ref 80.0–100.0)
MONOS PCT: 2 %
Monocytes Absolute: 0.3 10*3/uL (ref 0.1–1.0)
Neutro Abs: 0 10*3/uL — ABNORMAL LOW (ref 1.7–7.7)
Neutrophils Relative %: 0 %
Other: 53 %
PLATELETS: 3 10*3/uL — AB (ref 150–400)
RBC: 2.01 MIL/uL — ABNORMAL LOW (ref 4.22–5.81)
RDW: 15.5 % (ref 11.5–15.5)
WBC: 13.5 10*3/uL — ABNORMAL HIGH (ref 4.0–10.5)
nRBC: 0.4 % — ABNORMAL HIGH (ref 0.0–0.2)

## 2019-01-10 LAB — DAT, POLYSPECIFIC AHG (ARMC ONLY): Polyspecific AHG test: NEGATIVE

## 2019-01-10 LAB — FIBRIN DERIVATIVES D-DIMER (ARMC ONLY)

## 2019-01-10 LAB — PROTIME-INR
INR: 1.17
Prothrombin Time: 14.8 seconds (ref 11.4–15.2)

## 2019-01-10 LAB — TROPONIN I
Troponin I.cardiac:MCnc:Pt:Ser/Plas:Qn:: 0.08
Troponin I.cardiac:MCnc:Pt:Ser/Plas:Qn:: 0.095
Troponin I: 0.07 ng/mL (ref <0.03)

## 2019-01-10 LAB — FIBRINOGEN: FIBRINOGEN: 731 mg/dL — AB (ref 210–475)

## 2019-01-10 LAB — LACTIC ACID, PLASMA
Lactic Acid, Venous: 1.6 mmol/L (ref 0.5–1.9)
Lactic Acid, Venous: 2.1 mmol/L (ref 0.5–1.9)

## 2019-01-10 LAB — INFLUENZA PANEL BY PCR (TYPE A & B)
Influenza A By PCR: NEGATIVE
Influenza B By PCR: NEGATIVE

## 2019-01-10 LAB — APTT
APTT: 27.1 s (ref 25.9–39.5)
Coagulation surface induced:Time:Pt:PPP:Qn:Coag: 27.1
aPTT: 37 seconds — ABNORMAL HIGH (ref 24–36)

## 2019-01-10 LAB — CBC W/ AUTO DIFF
BASOPHILS ABSOLUTE COUNT: 0.1 10*9/L (ref 0.0–0.1)
EOSINOPHILS ABSOLUTE COUNT: 0 10*9/L (ref 0.0–0.4)
EOSINOPHILS RELATIVE PERCENT: 0.1 %
HEMATOCRIT: 22.7 % — ABNORMAL LOW (ref 41.0–53.0)
HEMOGLOBIN: 7.5 g/dL — ABNORMAL LOW (ref 13.5–17.5)
LARGE UNSTAINED CELLS: 72 % — ABNORMAL HIGH (ref 0–4)
LYMPHOCYTES ABSOLUTE COUNT: 1.6 10*9/L (ref 1.5–5.0)
LYMPHOCYTES RELATIVE PERCENT: 15.6 %
MEAN CORPUSCULAR HEMOGLOBIN: 28.7 pg (ref 26.0–34.0)
MEAN CORPUSCULAR VOLUME: 86.3 fL (ref 80.0–100.0)
MEAN PLATELET VOLUME: 7.3 fL (ref 7.0–10.0)
MONOCYTES ABSOLUTE COUNT: 0.8 10*9/L (ref 0.2–0.8)
MONOCYTES RELATIVE PERCENT: 7.7 %
NEUTROPHILS ABSOLUTE COUNT: 0.4 10*9/L — CL (ref 2.0–7.5)
NEUTROPHILS RELATIVE PERCENT: 3.6 %
NUCLEATED RED BLOOD CELLS: 1 /100{WBCs} (ref <=4)
RED BLOOD CELL COUNT: 2.63 10*12/L — ABNORMAL LOW (ref 4.50–5.90)
RED CELL DISTRIBUTION WIDTH: 15.9 % — ABNORMAL HIGH (ref 12.0–15.0)
WBC ADJUSTED: 10.3 10*9/L (ref 4.5–11.0)

## 2019-01-10 LAB — COMPREHENSIVE METABOLIC PANEL
ALBUMIN: 2.3 g/dL — ABNORMAL LOW (ref 3.5–5.0)
ALKALINE PHOSPHATASE: 356 U/L — ABNORMAL HIGH (ref 38–126)
ALT (SGPT): 82 U/L — ABNORMAL HIGH (ref <50)
ANION GAP: 11 mmol/L (ref 7–15)
AST (SGOT): 199 U/L — ABNORMAL HIGH (ref 19–55)
BILIRUBIN TOTAL: 0.6 mg/dL (ref 0.0–1.2)
BUN / CREAT RATIO: 22
CALCIUM: 6.6 mg/dL — ABNORMAL LOW (ref 8.5–10.2)
CHLORIDE: 106 mmol/L (ref 98–107)
CO2: 20 mmol/L — ABNORMAL LOW (ref 22.0–30.0)
CREATININE: 1.92 mg/dL — ABNORMAL HIGH (ref 0.70–1.30)
EGFR CKD-EPI AA MALE: 40 mL/min/{1.73_m2} — ABNORMAL LOW (ref >=60)
EGFR CKD-EPI NON-AA MALE: 35 mL/min/{1.73_m2} — ABNORMAL LOW (ref >=60)
GLUCOSE RANDOM: 114 mg/dL (ref 70–179)
POTASSIUM: 4.3 mmol/L (ref 3.5–5.0)
PROTEIN TOTAL: 4.6 g/dL — ABNORMAL LOW (ref 6.5–8.3)
SODIUM: 137 mmol/L (ref 135–145)

## 2019-01-10 LAB — ALBUMIN: Albumin:MCnc:Pt:Ser/Plas:Qn:: 2.3 — ABNORMAL LOW

## 2019-01-10 LAB — SLIDE REVIEW

## 2019-01-10 LAB — SMEAR REVIEW

## 2019-01-10 LAB — INR: Lab: 1.35

## 2019-01-10 LAB — BASOPHILS ABSOLUTE COUNT: Lab: 0.1

## 2019-01-10 LAB — LACTATE BLOOD VENOUS: Lactate:SCnc:Pt:BldV:Qn:: 1.5

## 2019-01-10 MED ORDER — SODIUM CHLORIDE 0.9 % IV SOLN
10.0000 mL/h | Freq: Once | INTRAVENOUS | Status: AC
  Administered 2019-01-10: 10 mL/h via INTRAVENOUS

## 2019-01-10 MED ORDER — SODIUM CHLORIDE 0.9 % IV SOLN: 10.0000 mL/h | Freq: Once | INTRAVENOUS | Status: DC

## 2019-01-10 MED ORDER — SODIUM CHLORIDE 0.9 % IV SOLN
2.0000 g | Freq: Once | INTRAVENOUS | Status: AC
  Administered 2019-01-10: 2 g via INTRAVENOUS
  Filled 2019-01-10: qty 2

## 2019-01-10 MED ORDER — VANCOMYCIN HCL IN DEXTROSE 1-5 GM/200ML-% IV SOLN
1000.0000 mg | Freq: Once | INTRAVENOUS | Status: AC
  Administered 2019-01-10: 1000 mg via INTRAVENOUS
  Filled 2019-01-10: qty 200

## 2019-01-10 NOTE — ED Notes (Addendum)
Lactic acid level resulting at 1609 (level 1.6) was collected at 1528, NOT collected at 1229.   Lab called, tech states she will check to see if time collected can be corrected.

## 2019-01-10 NOTE — ED Notes (Signed)
Pt transported to [facility] by [facility] team [id number].

## 2019-01-10 NOTE — ED Notes (Signed)
[facility] present to transport pt to [facility]

## 2019-01-10 NOTE — Progress Notes (Signed)
CODE SEPSIS - PHARMACY COMMUNICATION  **Broad Spectrum Antibiotics should be administered within 1 hour of Sepsis diagnosis**  Time Code Sepsis Called/Page Received: 1229  Antibiotics Ordered: cefepime and vancomycin  Time of 1st antibiotic administration: [place] ,PharmD, BCPS Clinical Pharmacist  01/10/2019  1:30 PM

## 2019-01-10 NOTE — ED Notes (Signed)
Charge nurse has verified EMTALA is complete.

## 2019-01-10 NOTE — ED Triage Notes (Signed)
Pt comes into the ED via EMS from [facility] with reports from the pt that he fell out of bed, pt was found on the floor by staff and they are concerned he needs a blood transfusion. States b/p there was 83/54. Pt is alert on arrival with some confusion.

## 2019-01-10 NOTE — ED Provider Notes (Addendum)
[facility] Emergency Department Provider Note  ____________________________________________   First MD Initiated Contact with Patient 01/10/19 1141     (approximate)  I have reviewed the triage vital signs and the nursing notes.   HISTORY  Chief Complaint Weakness and Fall   HPI TAO SATZ is a 68 y.o. male with a history of CKD, leukemia as well as hypertension hyperlipidemia who was presented to the emergency department today with worsening weakness over the past week as well as decreased p.o. intake.  Patient is here with family who state that he was walking with a walker approximate 1 week ago and is now barely able to get out of bed.  The patient says that he was shifting positions in his bed today when he slid out of bed onto the floor.  He is denying any pain.  Does not report hitting his head or losing consciousness.  Patient was supposed to be seen by his oncologist this morning at [facility] for possible blood transfusion as he has required in the past.  Family states that he is currently taking about 1 blood transfusion a week.  Patient on January 03, 2019 transfused 2 units of PRBC.   Past Medical History:  Diagnosis Date  . Chronic kidney disease (CKD), stage III (moderate) ([place]) 11/03/2015  . Diabetes mellitus without complication ([place])   . Hyperlipidemia   . Hypertension   . Leukemia, acute ([place])   . Stroke [facility])    per pt 2 CVA in 2000,and 1998 (  R ,then L side effected seperately per pt )    Patient Active Problem List   Diagnosis Date Noted  . Acute myeloid leukemia not having achieved remission ([place]) 12/06/2018  . Protein-calorie malnutrition, severe 10/25/2018  . Endocarditis 10/16/2018  . Sepsis ([place]) 10/09/2018  . Decreased energy 07/09/2018  . Thrombocytopenia ([place]) 04/01/2018  . Syncope 03/09/2018  . AD (atopic dermatitis) 11/03/2015  . Chronic kidney disease (CKD), stage III (moderate) ([place]) 11/03/2015  . Personal history of  stroke with residual effects 11/03/2015  . Hyperlipidemia associated with type 2 diabetes mellitus ([place]) 07/09/2015  . Hypertension 07/09/2015  . DM type 2 with diabetic peripheral neuropathy ([place]) 05/22/2015    Past Surgical History:  Procedure Laterality Date  . CATARACT EXTRACTION    . TEE WITHOUT CARDIOVERSION N/A 10/18/2018   Procedure: TRANSESOPHAGEAL ECHOCARDIOGRAM (TEE);  Surgeon: Teodoro Spray, MD;  Location: [facility] ORS;  Service: Cardiovascular;  Laterality: N/A;  . TONSILLECTOMY      Prior to Admission medications   Medication Sig Start Date End Date Taking? Authorizing Provider  acetaminophen (TYLENOL) 325 MG tablet Take 650 mg by mouth every 4 (four) hours as needed.   Yes [provider]  amLODipine (NORVASC) 10 MG tablet Take 1 tablet by mouth daily. 11/13/18 11/13/19 Yes [provider]  celecoxib (CELEBREX) 200 MG capsule Take 200 mg by mouth daily. 01/07/19  Yes [provider]  gabapentin (NEURONTIN) 100 MG capsule Take 100 mg by mouth 3 (three) times daily. 01/07/19  Yes [provider]  insulin glargine (LANTUS) 100 UNIT/ML injection Inject 17 Units into the skin at bedtime. 11/13/18  Yes [provider]  levofloxacin (LEVAQUIN) 250 MG tablet Take 250 mg by mouth daily. 12/04/18 12/04/19 Yes [provider]  mirtazapine (REMERON) 7.5 MG tablet Take 1 tablet by mouth at bedtime. 11/13/18  Yes [provider]  OLANZapine zydis (ZYPREXA) 5 MG disintegrating tablet Take 2.5 tablets by mouth at bedtime.  11/13/18  Yes [provider]  ondansetron (ZOFRAN) 8 MG tablet Take 1 tablet (8 mg total) by mouth 2 (two) times daily as needed (Nausea or vomiting). Patient taking differently: Take 4 mg by mouth every 8 (eight) hours as needed (Nausea or vomiting).  12/06/18  Yes Lloyd Huger, MD  posaconazole (NOXAFIL) 100 MG TBEC delayed-release tablet Take 300 mg by mouth daily. 12/28/18  Yes [provider]  pravastatin (PRAVACHOL) 10 MG tablet Take 10 mg by mouth at bedtime. 11/13/18  Yes [provider]  traMADol (ULTRAM) 50 MG tablet Take 50-100 mg by mouth every 4 (four) hours as needed. 01/07/19  Yes [provider]  valACYclovir (VALTREX) 500 MG tablet Take 1 tablet by mouth daily. 11/13/18  Yes [provider]    Allergies Patient has no known allergies.  Family History  Problem Relation Age of Onset  . Hypertension Father   . Diabetes Father   . Brain cancer Mother        per pt he was 77 and thinks this was cancer   . Coronary artery disease Sister        scarlet fever as child affected heart per pt    Social History Social History   Tobacco Use  . Smoking status: Former Smoker    Packs/day: 0.50    Years: 25.00    Pack years: 12.50    Types: Cigarettes    Last attempt to quit: 04/04/1999    Years since quitting: 19.7  . Smokeless tobacco: Former Systems developer    Types: Snuff  Substance Use Topics  . Alcohol use: No    Alcohol/week: 0.0 standard drinks  . Drug use: No    Review of Systems  Constitutional: No fever/chills Eyes: No visual changes. ENT: No sore throat. Cardiovascular: Denies chest pain. Respiratory: Denies shortness of breath. Gastrointestinal: No abdominal pain.  No nausea, no vomiting.  No diarrhea.  No constipation. Genitourinary: Negative for dysuria. Musculoskeletal: Negative for back pain. Skin: Negative for rash. Neurological: Negative for headaches, focal weakness or numbness.   ____________________________________________   PHYSICAL EXAM:  VITAL SIGNS: ED Triage Vitals  Enc Vitals Group     BP 01/10/19 1148 126/66     Pulse Rate 01/10/19 1148 (!) 112     Resp 01/10/19 1148 17     Temp 01/10/19 1148 98.7 F (37.1 C)     Temp Source 01/10/19 1148 Oral     SpO2 01/10/19 1148 93 %     Weight 01/10/19 1149 138 lb (62.6 kg)     Height 01/10/19 1149 5' 8" (1.727 m)     Head Circumference --      Peak Flow --       Pain Score 01/10/19 1149 0     Pain Loc --      Pain Edu? --      Excl. in [place]? --     Constitutional: Alert and oriented.  Patient appears weak and ill but without any distress. Eyes: Conjunctivae are pale. Head: Atraumatic. Nose: No congestion/rhinnorhea. Mouth/Throat: Mucous membranes are moist.  Neck: No stridor.   Cardiovascular: Tachycardic, regular rhythm. Grossly normal heart sounds.   Respiratory: Normal respiratory effort.  No retractions. Lungs CTAB. Gastrointestinal: Soft and nontender. No distention. No CVA tenderness. Musculoskeletal: Weak 4 out of 5 strength throughout all extremities.. Neurologic:  Normal speech and language. No gross focal neurologic deficits are appreciated. Skin:  Skin is warm, dry and intact. No rash noted.  Right upper extremity PICC line CDI.  No erythema, induration, exudate surrounding the insertion site. Psychiatric: Mood and affect are normal. Speech and behavior are normal.  ____________________________________________   LABS (all labs ordered are listed, but only abnormal results are displayed)  Labs Reviewed  CBC WITH DIFFERENTIAL/PLATELET - Abnormal; Notable for the following components:      Result Value   WBC 13.5 (*)    RBC 2.01 (*)    Hemoglobin 5.8 (*)    HCT 17.0 (*)    Platelets 3 (*)    nRBC 0.4 (*)    Neutro Abs 0.0 (*)    Lymphs Abs 6.1 (*)    All other components within normal limits  BASIC METABOLIC PANEL - Abnormal; Notable for the following components:   Sodium 133 (*)    CO2 19 (*)    Glucose, Bld 162 (*)    BUN 40 (*)    Creatinine, Ser 1.98 (*)    Calcium 6.9 (*)    GFR calc non Af Amer 33 (*)    GFR calc Af Amer 39 (*)    All other components within normal limits  TROPONIN I - Abnormal; Notable for the following components:   Troponin I 0.07 (*)    All other components within normal limits  LACTIC ACID, PLASMA - Abnormal; Notable for the following components:   Lactic Acid, Venous 2.1 (*)    All  other components within normal limits  CULTURE, BLOOD (ROUTINE X 2)  CULTURE, BLOOD (ROUTINE X 2)  MRSA PCR SCREENING  URINE CULTURE  INFLUENZA PANEL BY PCR (TYPE A & B)  URINALYSIS, COMPLETE (UACMP) WITH MICROSCOPIC  LACTIC ACID, PLASMA  URINALYSIS, ROUTINE W REFLEX MICROSCOPIC  PATHOLOGIST SMEAR REVIEW  HEPATIC FUNCTION PANEL  FIBRINOGEN  PROTIME-INR  APTT  FIBRIN DERIVATIVES D-DIMER ([facility] ONLY)  PATHOLOGIST SMEAR REVIEW  TYPE AND SCREEN  PREPARE RBC (CROSSMATCH)  PREPARE PLATELET PHERESIS  DAT, POLYSPECIFIC AHG ([facility] ONLY)   ____________________________________________  EKG  ED ECG REPORT I, Doran Stabler, the attending physician, personally viewed and interpreted this ECG.   Date: 01/10/2019  EKG Time: 1143  Rate: 112  Rhythm: sinus tachycardia  Axis: Normal  Intervals:none  ST&T Change: No ST segment elevation or depression.  No abnormal T wave inversion.  ____________________________________________  RADIOLOGY  Chest x-ray consistent with left lower lobe pneumonia New small subdural hematoma over the left cerebral convexity.  No significant mass-effect.  New widespread sclerotic bone lesions likely related to patient's known hematologic malignancy. Left lower lobe consolidation with significant atelectasis.  Likely due to obstruction of the left lower lobe bronchus likely due to inspissated secretions.  Pericardial effusion measuring 9 mm in greatest thickness.  Numerous abnormal lymph nodes in the mediastinum and retroperitoneum suggestive of progression of disease.  No acute lesion to the cervical spine. ____________________________________________   PROCEDURES  Procedure(s) performed:   .Critical Care Performed by: Orbie Pyo, MD Authorized by: Orbie Pyo, MD   Critical care provider statement:    Critical care time (minutes):  35   Critical care time was exclusive of:  Separately billable procedures and treating other  patients   Critical care was necessary to treat or prevent imminent or life-threatening deterioration of the following conditions:  Sepsis   Critical care was time spent personally by me on the following activities:  Development of treatment plan with patient or surrogate, discussions with consultants, evaluation of patient's response to treatment, examination of patient, obtaining history from  patient or surrogate, ordering and performing treatments and interventions, ordering and review of laboratory studies, ordering and review of radiographic studies, pulse oximetry, re-evaluation of patient's condition and review of old [place] performed:    ____________________________________________   INITIAL IMPRESSION / [place] / ED COURSE  Pertinent labs & imaging results that were available during my care of the patient were reviewed by me and considered in my medical decision making (see chart for details).  Differential diagnosis includes, but is not limited to, alcohol, illicit or prescription medications, or other toxic ingestion; intracranial pathology such as stroke or intracerebral hemorrhage; fever or infectious causes including sepsis; hypoxemia and/or hypercarbia; uremia; trauma; endocrine related disorders such as diabetes, hypoglycemia, and thyroid-related diseases; hypertensive encephalopathy; etc. As part of my medical decision making, I reviewed the following data within the electronic MEDICAL RECORD NUMBER Notes from prior outpatient visits  ----------------------------------------- 3:36 PM on 01/10/2019 -----------------------------------------  I had a lengthy discussion with Dr. Emogene Morgan, the heme-onc fellow at [facility].  He is aware of the patient's current medical condition and recommends transfusion with a goal hemoglobin of 7 and platelets greater than 10 and afebrile greater than 20.  DIC panel also sent.  However, the patient will be a ER to ER transfer  because of the traumatic component of his presentation today with a new subdural hemorrhage.  I discussed the case with Dr. Pincus Badder, emergency department attending, who accepts the patient as an ER to ER transfer.  Patient receiving blood at this time.  Platelets ordered but patient will not likely receive the platelets prior to being transferred secondary to platelets having to come from offsite.  Patient and family also aware of need for transfer to [facility] and of the very critical status of the patient at this time.  Patient and family are aware of the traumatic finding on the head CT as well as the pneumonia, hemoglobin and platelet levels.  Schistocytes present.  Possible DIC versus hemolytic anemia.  LFT ordered as well.  No obvious source of hemorrhage.  Patient not vomiting blood.  No reports of blood in the stool.  Will defer rectal exam secondary to absolute neutrophil count of 0.  Patient also ordered sepsis protocol and given broad-spectrum antibiotics. ____________________________________________   FINAL CLINICAL IMPRESSION(S) / ED DIAGNOSES  Subdural hemorrhage.  Symptomatic anemia.  Thrombocytopenia.  Generalized weakness.  HCAP.    NEW MEDICATIONS STARTED DURING THIS VISIT:  New Prescriptions   No medications on file     Note:  This document was prepared using Dragon voice recognition software and may include unintentional dictation errors.     Orbie Pyo, MD 01/10/19 1539    Clearnce Hasten, Randall An, MD 01/10/19 1540

## 2019-01-10 NOTE — Unmapped (Signed)
Transfer Center Request Note    Date and Time: January 10, 2019 2:02 PM    Requesting Physician: Dr Benny Lennert     [facility]: [place]    Requesting Service: ED    Reason for Transfer: AML treatment with Dr. Vertell Limber    Patient been to [facility] before? yes    Brief Hospital Course:   Chief complain(s): Patient cannot walk anymore for the last week was the reason for coming to ED. At baseline gets around with walker. Generalized weakness on exam. No focal neuro deficits per ED. Denies any cough, chest pain, abdominal pain. Per physcian patient seemed confused but per family patient at baseline for mental status. No bleeding from any source per ED.  Low concern for brain bleed per ED.   Course: He was started vanc and cefepime  Labs on arrival: WBC 13.8, Hb 5.8, platelet count 3, differential: ANC 0  BUN/Cr 40 Cr 1.98. K 4.5. Na 133.  Ca 9    Vitals:  BP 120/68, T 98.2, HR 104, RR 19.  Satting 93% on room air.   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  Adverse-risk, s/p 2 cycles of Azacitidine + Venetoclax without response and persistent pancytopenia.  Given his monocytic phenotype, he received an LP with IT chemo on 1/15 that was equivocal.  He had repeat LP on 1/23 that was a bloody sample.  A rare blast was identified and thought to be contaminant from peripheral blood.  There was insufficient cellularity for flow analysis.  Given all this, we do not believe that he has CNS disease.     Kyle Huffman and his brother had an extensive conversation last week with Dr. Vertell Limber regarding his options at this time for his AML.  Conventional care vs clinical trial on TPI-202, a study of alvocidib +/- LDAC for patients that have failed to respond or relapsed on first line therapy of aza/venetoclax.  Kyle Huffman and his brother are interested in the clinical trial and are prepared to consent.    Current bed type:   Plan Upon Arrival:   Floor  Bed Type: Floor    Accepting Service:   Med E1    Checklist for admission to [facility] from outside ED:  MUST MEET THE FOLLOWING VITAL PARAMATERS:  HR >60 and <120 no  SBP >90 and <180 no  Respirations >8 and <25 no  O2 sat >90% on < or = to 40% or stable home O2 requirement no    SHOULD NOT HAVE ANY OF THE FOLLOWING (MUST ALL BE NO ANSWERS):  Concern for neurologic complications related to cancer or treatments no  Acute AMS no  Positive troponin not drawn  Concern for leukostasis no  Severe sepsis/hemodynamic instability no  Differential includes sepsis or leukostasis yes  Supportive nursing interventions at least every 2 hours no  Drips, titratable meds or meds requiring frequent monitoring no  Vitals/Assessments/Labs more than every 4 hours no  Undiagnosed malignancy no  Unclear which primary service would best serve the patient no    Records of consults, imaging, pathology and discharge summary request to be faxed to [facility] fax [phone] : no    Disk of imaging requested: no    Pathology slides requested:no  Please page Med E Fellow at [phone] when patient arrives.     Fellow Triaging Request:  Lucille Passy

## 2019-01-10 NOTE — Unmapped (Signed)
Hi,     Burna Cash, on behalf of patient Kyle Huffman, has contacted the Communication Center in regards to the following symptom:     Patient has lost all use of lower extremities. Patient scheduled for appointment and infusion today but patient unable to sit up in a wheelchair.    Please contact Burna Cash at [phone].    Check Indicates criteria has been reviewed and confirmed with the patient:    [x]  Preferred Name   [x]  DOB and/or MR#  [x]  Preferred Contact Method  [x]  Phone Number(s)   []  MyChart     A page or telephone call has been made to the corresponding clinic.     Thank you,  Kelli Hope   [place] [facility]   [phone]

## 2019-01-10 NOTE — Unmapped (Signed)
Emergency Medicine Access Physician [facility])  [facility] Patient Logistics Center - Transfer Request Note    Requesting Provider: Dr Benny Lennert    Requesting [facility]: [facility]    Requesting Service: Emergency Medicine    Reason for transfer: ongoing cancer treatment, higher level of care    Overview of ED course at transferring hospital: Kyle Huffman is a 68 y.o. male with AML who presented to outside ED for generalized weakness. No reported trauma except maybe slid of of his bed on to his butt. Pan scanned. Found to have Hbg 5 and Platelets 3. CT head with 4mm SDH mostly chronic without shift and possibly small areas of SAH. Normal neuro exam except mild generalized weakness. Normal vitals. ED plans to transfuse pRBCs and platelets if they arrive. Do not feel pt meets trauma criteria.    Was the patient accepted to the [facility] ED in transfer: Yes.  If no, why?: N/A    Accepting service/expected service information:  Has an inpatient or consulting service been contacted by the [facility], [facility] or the referring provider?: Yes.  If admitting/consult service contacted by [facility]:  Time of discussion: Contacted through the [facility] prior to [facility] involement  Name/service (e.g. Dr. Floyde Parkins, neprology) of inpatient team member/consultant: oncology  Role of admit/consult service member: Fellow  Brief summary of call: Documented above in overview section  Anticipated Inpatient Bed Type Needed: To be determined    Outside Hospital Imaging:  Was Imaging Done at the Referring Hospital:  Yes.  If yes, what studies?: pan scanned CT  [employer] [facility]:  Yes.   [facility] [phone])

## 2019-01-10 NOTE — Unmapped (Signed)
AOC Triage Note     Patient: Kyle Huffman     Reason for call: Patient has lost use of lower extremeties and is unable to sit in wheelchair    Time call returned: 0937     Phone Assessment: Spoke with Maisie Fus, patients emergency contact. Patient has lost all strength, cannot stand, cannot sit in wheelchair.      Triage Recommendations: Informed Maisie Fus that I have spoken with Dr. Vertell Limber and he recommends patient to be evaluated in the emergency room. They will be able to reach Dr. Vertell Limber if needed.     Patient Response: They are going to [facility]. He will call EMS.      Outstanding tasks:Notify care team

## 2019-01-11 LAB — BPAM RBC
Blood Product Expiration Date: 202002142359
Blood Product Expiration Date: 202003012359
ISSUE DATE / TIME: 202002061341
Unit Type and Rh: 5100
Unit Type and Rh: 6200

## 2019-01-11 LAB — TYPE AND SCREEN
ABO/RH(D): A POS
Antibody Screen: NEGATIVE
Unit division: 0
Unit division: 0

## 2019-01-11 LAB — PREPARE PLATELET PHERESIS: Unit division: 0

## 2019-01-11 LAB — BPAM PLATELET PHERESIS
Blood Product Expiration Date: 202002072359
Unit Type and Rh: 6200

## 2019-01-11 LAB — PROTIME-INR: PROTIME: 15.1 s — ABNORMAL HIGH (ref 10.2–13.1)

## 2019-01-11 LAB — ADDON DIFFERENTIAL ONLY
BASOPHILS ABSOLUTE COUNT: 0 10*9/L (ref 0.0–0.1)
BASOPHILS RELATIVE PERCENT: 0.1 %
EOSINOPHILS RELATIVE PERCENT: 0 %
LARGE UNSTAINED CELLS: 76 % — ABNORMAL HIGH (ref 0–4)
LYMPHOCYTES RELATIVE PERCENT: 16.2 %
MONOCYTES ABSOLUTE COUNT: 0.5 10*9/L (ref 0.2–0.8)
MONOCYTES RELATIVE PERCENT: 5.4 %
NEUTROPHILS ABSOLUTE COUNT: 0.2 10*9/L — CL (ref 2.0–7.5)
NEUTROPHILS RELATIVE PERCENT: 2.8 %

## 2019-01-11 LAB — NEUTROPHILS ABSOLUTE COUNT: Lab: 0.2 — CL

## 2019-01-11 LAB — COMPREHENSIVE METABOLIC PANEL
ALBUMIN: 2.3 g/dL — ABNORMAL LOW (ref 3.5–5.0)
ALKALINE PHOSPHATASE: 321 U/L — ABNORMAL HIGH (ref 38–126)
ALT (SGPT): 68 U/L — ABNORMAL HIGH (ref <50)
ANION GAP: 10 mmol/L (ref 7–15)
AST (SGOT): 134 U/L — ABNORMAL HIGH (ref 19–55)
BILIRUBIN TOTAL: 0.9 mg/dL (ref 0.0–1.2)
BLOOD UREA NITROGEN: 45 mg/dL — ABNORMAL HIGH (ref 7–21)
BUN / CREAT RATIO: 25
CALCIUM: 6.5 mg/dL — ABNORMAL LOW (ref 8.5–10.2)
CHLORIDE: 102 mmol/L (ref 98–107)
CO2: 23 mmol/L (ref 22.0–30.0)
CREATININE: 1.83 mg/dL — ABNORMAL HIGH (ref 0.70–1.30)
EGFR CKD-EPI AA MALE: 43 mL/min/{1.73_m2} — ABNORMAL LOW (ref >=60)
EGFR CKD-EPI NON-AA MALE: 37 mL/min/{1.73_m2} — ABNORMAL LOW (ref >=60)
POTASSIUM: 3.9 mmol/L (ref 3.5–5.0)
PROTEIN TOTAL: 4.6 g/dL — ABNORMAL LOW (ref 6.5–8.3)

## 2019-01-11 LAB — CBC
HEMATOCRIT: 21.4 % — ABNORMAL LOW (ref 41.0–53.0)
HEMOGLOBIN: 7.2 g/dL — ABNORMAL LOW (ref 13.5–17.5)
HEMOGLOBIN: 7.3 g/dL — ABNORMAL LOW (ref 13.5–17.5)
MEAN CORPUSCULAR HEMOGLOBIN CONC: 34.1 g/dL (ref 31.0–37.0)
MEAN CORPUSCULAR HEMOGLOBIN CONC: 34 g/dL (ref 31.0–37.0)
MEAN CORPUSCULAR HEMOGLOBIN: 29.6 pg (ref 26.0–34.0)
MEAN CORPUSCULAR VOLUME: 86.8 fL (ref 80.0–100.0)
MEAN CORPUSCULAR VOLUME: 86.3 fL (ref 80.0–100.0)
MEAN PLATELET VOLUME: 9.4 fL (ref 7.0–10.0)
MEAN PLATELET VOLUME: 9.3 fL (ref 7.0–10.0)
PLATELET COUNT: 44 10*9/L — ABNORMAL LOW (ref 150–440)
PLATELET COUNT: 102 10*9/L — ABNORMAL LOW (ref 150–440)
RED BLOOD CELL COUNT: 2.42 10*12/L — ABNORMAL LOW (ref 4.50–5.90)
RED BLOOD CELL COUNT: 2.48 10*12/L — ABNORMAL LOW (ref 4.50–5.90)
WBC ADJUSTED: 9.1 10*9/L (ref 4.5–11.0)
WBC ADJUSTED: 8.5 10*9/L (ref 4.5–11.0)

## 2019-01-11 LAB — RED BLOOD CELL COUNT
Lab: 2.42 — ABNORMAL LOW
Lab: 2.48 — ABNORMAL LOW

## 2019-01-11 LAB — MAGNESIUM: Magnesium:MCnc:Pt:Ser/Plas:Qn:: 1.8

## 2019-01-11 LAB — ALT (SGPT): Alanine aminotransferase:CCnc:Pt:Ser/Plas:Qn:: 68 — ABNORMAL HIGH

## 2019-01-11 LAB — PROTIME: Lab: 15.1 — ABNORMAL HIGH

## 2019-01-11 LAB — SMEAR REVIEW

## 2019-01-11 LAB — PHOSPHORUS: Phosphate:MCnc:Pt:Ser/Plas:Qn:: 4.1

## 2019-01-11 LAB — TROPONIN I: Troponin I.cardiac:MCnc:Pt:Ser/Plas:Qn:: 0.074

## 2019-01-11 NOTE — Unmapped (Signed)
Patient rounds completed. The following patient needs were addressed:  Plan of Care, Call Bell in Reach and Bed Position Low .

## 2019-01-11 NOTE — Unmapped (Signed)
Patient rounding complete, call bell in reach, bed locked and in lowest position, patient belongings at bedside and within reach of patient.  Patient updated on plan of care.

## 2019-01-11 NOTE — Unmapped (Signed)
?? 

## 2019-01-11 NOTE — Unmapped (Signed)
Vancomycin Therapeutic Monitoring Pharmacy Note    Kyle Huffman is a 68 y.o. male starting vancomycin. Date of therapy initiation: 01/11/19    Indication: Pneumonia    Prior Dosing Information: None/new initiation     Goals:  Therapeutic Drug Levels  Vancomycin trough goal: 15-20 mg/L    Additional Clinical Monitoring/Outcomes  Renal function, volume status (intake and output)    Results: Not applicable    Wt Readings from Last 1 Encounters:   01/02/19 61 kg (134 lb 9.5 oz)     Creatinine   Date Value Ref Range Status   01/10/2019 1.92 (H) 0.70 - 1.30 mg/dL Final   46/96/2952 8.41 (H) 0.70 - 1.30 mg/dL Final   32/44/0102 7.25 (H) 0.70 - 1.30 mg/dL Final        Pharmacokinetic Considerations and Significant Drug Interactions:  ? Adult (estimated initial): Vd = 43.31 L, ke = 0.0351 hr-1  ? Concurrent nephrotoxic meds: cefepime    Assessment/Plan:  Recommendation(s)  ? Start vancomycin 1000 mg q24h  ? Estimated trough on recommended regimen: 18 mg/L    Follow-up  ? Level due: prior to fourth or fifth dose.   ? A pharmacist will continue to monitor and order levels as appropriate    Please page service pharmacist with questions/clarifications.    Denman George, PharmD

## 2019-01-11 NOTE — Unmapped (Signed)
Patient rounds completed. The following patient needs were addressed:  Plan of Care, Call Bell in Reach and Bed Position Low     1st unit platelets complete. Called blood bank for second unit, blood bank states typically redraw CBC after 1 unit platelets. only one has been administered, may I proceed and re-draw CBC to reassess. report typically prepare 2nd unit based on results. Hospitalist paged for consent to proceed w/ CBC prior to second unit infused.

## 2019-01-11 NOTE — Unmapped (Signed)
[facility] Emergency Department Provider Note    Patient Identification  Kyle Huffman  Patient information was obtained from patient, EMS personnel and past medical records.  History/Exam limitations: none.    ED Clinical Impression     Final diagnoses:   Weakness (Primary)   Anemia, unspecified type   Thrombocytopenia (CMS-[name])   SDH (subdural hematoma) (CMS-[name])   SAH (subarachnoid hemorrhage) (CMS-[name])     Initial Impression, ED Course, Assessment and Plan     Impression: Kyle Huffman is a 68 y.o. male patient with history of acute myeloid leukemia, failed first line treatment s/p 2 cycles of Azacitidine + Venetoclax without response and persistent pancytopenia, CKD, hypertension, diabetes mellitus, hyperlipidemia, and stroke presenting today from OSH after being transferred for suspected unwitnessed fall, and found to have abrasion to right forehead, subdural hematoma with no midline shift, along with Hgb of 5 and given one unit of pRBC's and platelets.     ED Course: Plan for CBC, CMP, Blood cultures, Type and Screen, Troponin. Anticipate admission. Neurosurg consult.     [place] ED physician spoke to Hem-Onc fellow at [facility]: transfusion goals are Hgb >/= 7, platelets >10.     5:52 PM  I spoke to NSG who agrees to evaluate patient.     NSG with no acute surgical intervention. Recommends medical admission. I paged Med E for admission, but team is full. Paged Med H who agrees to evaluate for admission. Will transfuse with 2 additional units of platelets as not yet at goal after re-check after 1st unit.     Disposition: Admit    Additional Medical Decision Making     I independently visualized the EKG tracing.   I independently visualized the radiology images.   I reviewed the patient's prior medical records.   I discussed the case with the NSG consultant and admitting provider.     Any labs and radiology results that were available during my care of the patient were independently reviewed by me and considered in my medical decision making.    Portions of this record have been created using Scientist, clinical (histocompatibility and immunogenetics). Dictation errors have been sought, but may not have been identified and corrected.  ____________________________________________    I have reviewed the triage vital signs and the nursing notes. I have discussed case with ED attending, Dr. Yetta Barre.     Patient History     Chief Complaint  Abnormal Lab      HPI   Kyle Huffman is a 68 y.o. male patient with history of acute myeloid leukemia, failed first line treatment s/p 2 cycles of Azacitidine + Venetoclax without response and persistent pancytopenia, CKD, hypertension, diabetes mellitus, hyperlipidemia, and stroke presenting today with abnormal lab. According to EMS, patient was found at [facility] out of bed and on floor with an abrasion to his head. They called EMS given suspected fall. At OSH, he was found to have low platelets, low hemoglobin and received one unit of platelets and underlying 4 mm subdural hematoma with no midline shift. Patient denies any pain, chest pain, shortness of breath, or any other complaints at this time.     According to OSH records, patient was given vancomycin and cefepime. CT Chest, A/P showed likely bilateral pneumonia, pleural effusion, and metastatic disease.     Past Medical History:   Diagnosis Date   ??? Diabetes mellitus (CMS-[name])    ??? Hyperlipidemia    ??? Hypertension    ??? Stroke (CMS-[name])  Patient Active Problem List   Diagnosis   ??? Chronic kidney disease (CKD), stage III (moderate) (CMS-[name])   ??? Hyperlipidemia associated with type 2 diabetes mellitus (CMS-[name])   ??? Type 2 diabetes mellitus with diabetic polyneuropathy, with long-term current use of insulin (CMS-[name])   ??? Aortic valve endocarditis   ??? Hypertension   ??? 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 Weakness of both lower extremities   ??? Acute myeloid leukemia not having achieved remission (CMS-[name])       Past Surgical History:   Procedure Laterality Date   ??? CATARACT EXTRACTION     ??? TONSILLECTOMY         No current facility-administered medications for this encounter.     Current Outpatient Medications:   ???  amLODIPine (NORVASC) 10 MG tablet, Take 1 tablet (10 mg total) by mouth daily., Disp: 90 tablet, Rfl: 3  ???  insulin glargine (LANTUS) 100 unit/mL injection, Inject 0.15 mL (15 Units total) under the skin nightly., Disp: 4.5 mL, Rfl: 0  ???  insulin lispro (HUMALOG) 100 unit/mL injection, Inject 0-0.12 mL (0-12 Units total) under the skin Four (4) times a day (before meals and nightly). See discharge summary for titration., Disp: 20 mL, Rfl: 0  ???  levoFLOXacin (LEVAQUIN) 250 MG tablet, Take 1 tablet (250 mg total) by mouth daily., Disp: 30 tablet, Rfl: 3  ???  mirtazapine (REMERON) 7.5 MG tablet, Take 1 tablet (7.5 mg total) by mouth nightly., Disp: 90 tablet, Rfl: 3  ???  OLANZapine zydis (ZYPREXA) 5 MG disintegrating tablet, Take 0.5 tablets (2.5 mg total) by mouth nightly., Disp: 15 tablet, Rfl: 0  ???  ondansetron (ZOFRAN-ODT) 4 MG disintegrating tablet, Take 1 tablet (4 mg total) by mouth every eight (8) hours as needed for nausea., Disp: 30 tablet, Rfl: 0  ???  ONETOUCH VERIO Strp, , Disp: , Rfl:   ???  posaconazole (NOXAFIL) 100 mg TbEC delayed released tablet, Take 3 TABLETS (300 mg) by mouth daily., Disp: 90 tablet, Rfl: 3  ???  pravastatin (PRAVACHOL) 10 MG tablet, Take 1 tablet (10 mg total) by mouth nightly., Disp: 30 tablet, Rfl: 0  ???  sevelamer (RENVELA) 800 mg tablet, Take 1 tablet (800 mg total) by mouth Three (3) times a day with a meal., Disp: 30 tablet, Rfl: 0  ???  valACYclovir (VALTREX) 500 MG tablet, Take 1 tablet (500 mg total) by mouth daily., Disp: , Rfl: 0    Allergies  Patient has no known allergies.    Family History   Problem Relation Age of Onset   ??? Heart disease Father    ??? Hypertension Father    ??? Diabetes Father    ??? Brain cancer Mother    ??? Heart disease Sister        Social History  Social History Tobacco Use   ??? Smoking status: Former Smoker     Packs/day: 0.50     Years: 25.00     Pack years: 12.50     Types: Cigarettes     Last attempt to quit: 2000     Years since quitting: 20.1   ??? Smokeless tobacco: Former Neurosurgeon     Types: Snuff   Substance Use Topics   ??? Alcohol use: Not Currently   ??? Drug use: Never       Review of Systems  General: no fevers, chills  Cardiac: no CP, SOB, palpitations  Pulmonary: no cough, sputum production, hemoptysis  A 10 point  review of systems was negative except for those previously mentioned in the HPI and above.    Physical Exam     Vitals:    01/11/19 1516   BP: 133/63   Pulse: 106   Resp: 16   Temp: 36 ??C (96.8 ??F)   SpO2: 98%     Constitutional: chronically ill-appearing, alert and cooperative in NAD  ENT:       Head: [place], abrasion noted above right eyebrow       Eyes: PERRL, conjunctiva clear       Nose: no congestion       Mouth/Throat: MM moist, gingival bleeding, no focal source of bleeding       Neck: supple, midline trachea, no tenderness or cervical adenopathy  Cardiac: mild tachycardia, RR, normal S1, S2, no appreciable murmurs or rubs. Distal pulses present and symmetrical B/L  Respiratory: CTA bilaterally, diminished breath sounds on right compared to left, non-labored breathing, no stridor, wheezing or crackles  Abdomen: soft, NT, normal BS. No masses, rebound or guarding  Extremities: LE normal with no CCE  Skin: warm and dry, no rashes or lesions  Neurologic: CN II-XII intact, 4/5 strength in bilateral lower extremities, 5/5 strength in bilateral upper extremities, no gross focal neurologic deficits appreciated  Psychiatric: normal mood, affect, speech and behavior    EKG     Ventricular rate 101  PR interval 134  QRS 76  QTc 440    Sinus tachycardia. Narrow QRS, normal QTc. No acute ST or T wave changes consistent with ischemia.     Documentation assistance was provided by Woody Seller, Scribe, on January 10, 2019 at 5:26 PM for Marlynn Perking, MD.    January 11, 2019 8:35 PM. Documentation assistance provided by the scribe. I was present during the time the encounter was recorded. The information recorded by the scribe was done at my direction and has been reviewed and validated by me. Marlynn Perking, MD, MS.          Ulice Dash, MD  Resident  01/11/19 [phone]

## 2019-01-11 NOTE — Unmapped (Signed)
Bed: 06-P  Expected date:   Expected time:   Means of arrival:   Comments:  RESUS

## 2019-01-11 NOTE — Unmapped (Addendum)
Patient rounds completed. The following patient needs were addressed:  Plan of Care, Call Bell in Reach and Bed Position Low .    Second unit initiated and night medications given. Pt in NAD. No transfusion reaction.

## 2019-01-11 NOTE — Unmapped (Signed)
Patient rounds completed. The following patient needs were addressed:  Pain, Toileting, Personal Belongings, Plan of Care, Call Bell in Reach and Bed Position Low .

## 2019-01-11 NOTE — Unmapped (Addendum)
Patient rounds completed. The following patient needs were addressed:  Pain, Plan of Care, Call Bell in Reach and Bed Position Low .    Pt in NAD. Admitting MD, hospitalist at bedside for pt assessment. Pt in NAD. Brother at bedside, will continue to monitor.

## 2019-01-11 NOTE — Unmapped (Addendum)
Patient rounds completed. The following patient needs were addressed:  Pain, Toileting, Personal Belongings, Plan of Care, Call Bell in Reach and Bed Position Low       Pt reassessment unchanged from previous.

## 2019-01-11 NOTE — Unmapped (Signed)
Patient rounds completed. The following patient needs were addressed:  Pain, Plan of Care, Call Bell in Reach and Bed Position Low .    Blood bank called for 2nd unit of platelets.

## 2019-01-11 NOTE — Unmapped (Signed)
Patient rounds completed. The following patient needs were addressed:  Plan of Care, Call Bell in Reach and Bed Position Low     Pt in NAD, updated on plan of care, will continue to monitor. Marland Kitchen

## 2019-01-11 NOTE — Unmapped (Signed)
Report given to Mardella Layman, RN and care transferred.

## 2019-01-11 NOTE — Unmapped (Addendum)
[facility] Medicine   History and Physical    Assessment/Plan:    Principal Problem:    Bilateral pneumonia  Active Problems:    Chronic kidney disease (CKD), stage III (moderate) (CMS-[name])    Hyperlipidemia associated with type 2 diabetes mellitus (CMS-[name])    Type 2 diabetes mellitus with diabetic polyneuropathy, with long-term current use of insulin (CMS-[name])    Hypertension    Blastic plasmacytoid dendritic cell neoplasm (BPDCN) (CMS-[name])    Weakness of both lower extremities    Acute myeloid leukemia not having achieved remission (CMS-[name])    Chronic subdural hematoma (CMS-[name])      Kyle Huffman is a 68 y.o. male with PMHx of acute myeloid leukemia, failed first line treatment s/p 2 cycles of Azacitidine + Venetoclax without response and persistent pancytopenia, CKD, hypertension, diabetes mellitus, hyperlipidemia, and recent admission for culture negative endocarditis and remote stroke presenting to [facility] with weakness and decreased counts.     Bilateral Pneumonia - Per OSH CT chest concern for inspissated secretions causing consolidation/atelectasis of LLL. In the setting of neutropenia. Pressures initially low but stabilizing with blood, fluids and antibiotics. No oxygen requirement.Patient was given broad spectrum antibiotics at OSH. Will continue broad coverage.  - Follow up on [place] blood cultures unclear from OSH records if these were done at [facility] as well  - Vanc/cefepime/ can add flagyl if not improving as expected   - Consider PICC exchange if blood cultures positive    Subdural hematoma- Visualized on OSH imaging earlier today.The patient was seen by neurosurgery in the ED. No surgical intervention at this time. Thought to be a chronic subdural and not clinically significant.  - CTM  - Platelet repletion as below    AML- Concern for disease progression on scans and clinically. Plan with oncology is to enroll in clinical trial TPI-202 (study of alvocidib +/- LDAC for patients that have failed to respond or relapsed on first line therapy of aza/venetoclax). Will reach out to oncologist to coordinate care.   - Heme/onc consult in am, ordered, not formally called  - Daily CBC, Hg goal > 7, platelet goal >50 given subdural (30 if 50 is not possible)-- this is per discussion w/ covering onc fellow  - Continue valtrex and posaconazole ppx  - Hold levaquin PPx while on vanc/cefepime    Elevated troponin: Likely demand in the setting of infection.   - Trend troponin until downtrend    Elevated Transaminases: Bili stable. Suspect this is related to systemic infection as well.  - Trend until downtrend    Pericardial Effusion: Does not appear to be hemodynamically significant.  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 He states he receives insulin once a night at his nursing home. Will start with decreased dose of lantus and titrate as needed.  - Lantus 10 units nightly  - SSI PRN    HTN- Hold home amlodipine while acutely ill, restart as able      Code Status:  Full code- Patient wound like to be full code. Denied he ever had a previous DNR/DNI. This was discussed at length  with the patient on admission with his brother and POA present.     POA: Brother. [name]. [phone]     ___________________________________________________________________    Chief Complaint  Chief Complaint   Patient presents with   ??? Abnormal Lab     History obtained from patient and brother/POA at the bedside. Prior records and OSH records reviewed. Care discussed with ED provider and covering ONC fellow.     HPI:  Kyle Huffman is a 68 y.o. male with PMHx of acute myeloid leukemia, failed first line treatment s/p 2 cycles of Azacitidine + Venetoclax without response and persistent pancytopenia, CKD, hypertension, diabetes mellitus, hyperlipidemia, and stroke presenting to [facility] with weakness and decreased counts.     The patient's brother arrived at his ALF earlier this morning to drive the patient to a scheduled blood transfusion. The patient was unable to get out of bed due to weakness and fatigue. His brother called his oncologist, who advised the patient go to the ED. Upon returning to the patient's room, his brother found the patient had fallen and was on the floor with an abrasion to the head. Patient remembers fall completely. EMS was called. At OSH, he was found to have low platelets and low hemoglobin and was given one unit of pRBC's and platelets, per ED provider note earlier today. However, the patient reports he did not receive platelets at OSH. According to OSH records, patient was also given vancomycin and cefepime. CT Chest, A/P showed likely bilateral pneumonia, pleural effusion, and metastatic disease also with a 9mm pericardial effusion. CT head revealed underlying 4 mm subdural hematoma with no midline shift. Patient was transferred to [facility] for higher level care. Neurosurgery evaluated and felt subdural to be chronic and not in need of intervention.    Currently, patient is seen at bedside with his brother. The patient states he remembers hitting his head earlier today. The patient endorses recent onset of worsening leg weakness 4-5 days ago, with associated inability to walk, inability to perform ADL, and significant fatigue different from baseline.His brother reports the patient has been sleeping very often. His brother reports the patient makes a gurgling sound when he sleeps heavily.Before receiving blood earlier today, the patient's brother reports the patient was very confused with AMS, and also presented with gurgling sounds with breathing. His brother reports the patient is a different person now after receiving blood at OSH. Currently, the patient denies cough, SOB, fevers, stomach pain, diarrhea, or dizziness. He states he has not been eating well.     The patient has a history of AML diagnosed in 10/2018 s/p 2 cycles of Azacitidine + Venetoclax without response and persistent pancytopenia. Given his monocytic phenotype, patient received an LP with IT chemo recently on 1/15 that was equivocal. Repeat LP on 1/23 that was a bloody sample. Oncology does not believe he has CNS disease. Plan is for patient to enroll in clinical trial on TPI-202 (study of alvocidib +/- LDAC for patients that have failed to respond or relapsed on first line therapy of aza/venetoclax).         Allergies:  Patient has no known allergies.     Medications:   Prior to Admission medications    Medication Dose, Route, Frequency   amLODIPine (NORVASC) 10 MG tablet 10 mg, Oral, Daily (standard)   celecoxib (CELEBREX) 200 MG capsule 200 mg, Oral, Daily   gabapentin (NEURONTIN) 100 MG capsule 100 mg, Oral, Nightly   insulin glargine (LANTUS) 100 unit/mL injection  15 Units, Subcutaneous, Nightly   insulin lispro (HUMALOG) 100 unit/mL injection 0-12 Units, Subcutaneous, 4 times a day ([facility]), See discharge summary for titration.   levoFLOXacin (LEVAQUIN) 250 MG tablet 250 mg, Oral, Daily (standard)   OLANZapine zydis (ZYPREXA) 5 MG disintegrating tablet 2.5 mg, Oral, Nightly   ondansetron (ZOFRAN-ODT) 4 MG disintegrating tablet 4 mg, Oral, Every 8 hours PRN   ONETOUCH VERIO Strp No dose, route, or frequency recorded.   posaconazole (NOXAFIL) 100 mg TbEC delayed released tablet 300 mg, Oral, Daily (standard)   pravastatin (PRAVACHOL) 10 MG tablet 10 mg, Oral, Nightly   sevelamer (RENVELA) 800 mg tablet 800 mg, Oral, 3 times a day (with meals)   traMADol (ULTRAM) 50 mg tablet 50 mg, Oral, Daily   valACYclovir (VALTREX) 500 MG tablet 500 mg, Oral, Daily (standard)       Medical History:  Past Medical History:   Diagnosis Date   ??? Diabetes mellitus (CMS-[name])    ??? Hyperlipidemia    ??? Hypertension    ??? Stroke (CMS-[name])        Surgical History:  Past Surgical History:   Procedure Laterality Date   ??? CATARACT EXTRACTION     ??? TONSILLECTOMY         Social History:  Social History     Socioeconomic History   ??? Marital status: Single     Spouse name: Not on file   ??? Number of children: Not on file   ??? Years of education: Not on file   ??? Highest education level: Not on file   Occupational History   ??? Not on file   Social Needs   ??? Financial resource strain: Not on file   ??? Food insecurity     Worry: Not on file     Inability: Not on file   ??? Transportation needs     Medical: Not on file     Non-medical: Not on file   Tobacco Use   ??? Smoking status: Former Smoker     Packs/day: 0.50     Years: 25.00     Pack years: 12.50     Types: Cigarettes     Last attempt to quit: 2000     Years since quitting: 20.1   ??? Smokeless tobacco: Former Neurosurgeon     Types: Snuff   Substance and Sexual Activity   ??? Alcohol use: Not Currently   ??? Drug use: Never   ??? Sexual activity: Not on file   Lifestyle   ??? Physical activity     Days per week: Not on file     Minutes per session: Not on file   ??? Stress: Not on file   Relationships   ??? Social Wellsite geologist on phone: Not on file     Gets together: Not on file     Attends religious service: Not on file     Active member of club or organization: Not on file     Attends meetings of clubs or organizations: Not on file     Relationship status: Not on file   Other Topics Concern   ??? Not on file   Social History Narrative        Patient is currently a resident at [facility] in [place], [place]. Social support includes his brother.                Family History:  Family History   Problem Relation Age of Onset   ???  Heart disease Father    ??? Hypertension Father    ??? Diabetes Father    ??? Brain cancer Mother    ??? Heart disease Sister        Review of Systems:  10 systems reviewed and are negative unless otherwise mentioned in HPI      Physical Exam:  Temp:  [36.9 ??C (98.5 ??F)-37 ??C (98.6 ??F)] 37 ??C (98.6 ??F)  Heart Rate:  [102-112] 112  SpO2 Pulse:  [103-108] 108  Resp:  [16-20] 18  BP: (114-124)/(51-64) 124/64  SpO2:  [95 %-98 %] 97 %  There is no height or weight on file to calculate BMI.      General: No acute distress, alert, oriented and communicative  HEENT: PEERL, EOMI, evidence of dried blood around the gums and lips. Otherwise oropharynx is clear of masses or lesions. No cervical or supraclavicular LAD, no apparent elevated JVD  Respiratory: In the left lung,  rhonchi in the lower lobe. In the right lung, breath sounds are diminished at the base. Otherwise lungs sound clear. No crackles. No wheezes.   Cardiovascular: Mildly tachycardic. no murmurs, rubs or gallops, extremities warm  Gastrointestinal: Bowel sounds present, Abdomen non-tender to palpation in all quadrants, no rebound or guarding, no obvious organomegally  Extremities: No LE edema, distal pulses palpable.  MSK: No joint or limb deformities, no muscle tenderness to palpation  Skin: No diaphoresis, no rashes or wounds on examined skin   Neuro: Alert and oriented, no facial droop, moving all extremities symmetrically.  Grip strength is 4/5 bilaterally. Biceps and triceps 5/5. LE strength 4/5 bilaterally. Reflexes not hyperactive. Babinski's are downgoing bilaterally.    Test Results:  Data Review:    All lab results last 24 hours:    Recent Results (from the past 24 hour(s))   Comprehensive Metabolic Panel    Collection Time: 01/10/19  5:32 PM   Result Value Ref Range    Sodium 137 135 - 145 mmol/L    Potassium 4.3 3.5 - 5.0 mmol/L    Chloride 106 98 - 107 mmol/L    Anion Gap 11 7 - 15 mmol/L    CO2 20.0 (L) 22.0 - 30.0 mmol/L    BUN 42 (H) 7 - 21 mg/dL    Creatinine 8.65 (H) 0.70 - 1.30 mg/dL    BUN/Creatinine Ratio 22     EGFR CKD-EPI Non-African American, Male 35 (L) >=60 mL/min/1.7m2    EGFR CKD-EPI African American, Male 40 (L) >=60 mL/min/1.79m2    Glucose 114 70 - 179 mg/dL Calcium 6.6 (L) 8.5 - 78.4 mg/dL    Albumin 2.3 (L) 3.5 - 5.0 g/dL    Total Protein 4.6 (L) 6.5 - 8.3 g/dL    Total Bilirubin 0.6 0.0 - 1.2 mg/dL    AST 696 (H) 19 - 55 U/L    ALT 82 (H) <50 U/L    Alkaline Phosphatase 356 (H) 38 - 126 U/L   PT-INR    Collection Time: 01/10/19  5:32 PM   Result Value Ref Range    PT 15.6 (H) 10.2 - 13.1 sec    INR 1.35    APTT    Collection Time: 01/10/19  5:32 PM   Result Value Ref Range    APTT 27.1 25.9 - 39.5 sec    Heparin Correlation <0.2    Troponin I    Collection Time: 01/10/19  5:32 PM   Result Value Ref Range    Troponin I 0.095 (HH) <0.034 ng/mL  CBC w/ Differential    Collection Time: 01/10/19  5:32 PM   Result Value Ref Range    WBC 10.3 4.5 - 11.0 10*9/L    RBC 2.63 (L) 4.50 - 5.90 10*12/L    HGB 7.5 (L) 13.5 - 17.5 g/dL    HCT 16.1 (L) 09.6 - 53.0 %    MCV 86.3 80.0 - 100.0 fL    MCH 28.7 26.0 - 34.0 pg    MCHC 33.3 31.0 - 37.0 g/dL    RDW 04.5 (H) 40.9 - 15.0 %    MPV 7.3 7.0 - 10.0 fL    Platelet 7 (LL) 150 - 440 10*9/L    nRBC 1 <=4 /100 WBCs    Neutrophils % 3.6 %    Lymphocytes % 15.6 %    Monocytes % 7.7 %    Eosinophils % 0.1 %    Basophils % 0.6 %    Absolute Neutrophils 0.4 (LL) 2.0 - 7.5 10*9/L    Absolute Lymphocytes 1.6 1.5 - 5.0 10*9/L    Absolute Monocytes 0.8 0.2 - 0.8 10*9/L    Absolute Eosinophils 0.0 0.0 - 0.4 10*9/L    Absolute Basophils 0.1 0.0 - 0.1 10*9/L    Large Unstained Cells 72 (H) 0 - 4 %    Microcytosis Slight (A) Not Present   Morphology Review    Collection Time: 01/10/19  5:32 PM   Result Value Ref Range    Smear Review Comments See Comment (A) Undefined   Lactic Acid, Venous, Whole Blood    Collection Time: 01/10/19  5:38 PM   Result Value Ref Range    Lactate, Venous 1.5 0.5 - 1.8 mmol/L   ECG 12 lead (Adult)    Collection Time: 01/10/19  5:40 PM   Result Value Ref Range    EKG Systolic BP  mmHg    EKG Diastolic BP  mmHg    EKG Ventricular Rate 101 BPM    EKG Atrial Rate 101 BPM    EKG P-R Interval 134 ms    EKG QRS Duration 76 ms EKG Q-T Interval 340 ms    EKG QTC Calculation 440 ms    EKG Calculated P Axis 54 degrees    EKG Calculated R Axis 49 degrees    EKG Calculated T Axis 65 degrees    QTC Fredericia 404 ms   Type and Screen    Collection Time: 01/10/19  5:43 PM   Result Value Ref Range    Antibody Screen NEG     Blood Type A POS     ABO Grouping Invalid    Prepare Platelet Pheresis    Collection Time: 01/10/19  9:33 PM   Result Value Ref Range    Unit Blood Type A Pos     ISBT Number 6200     Unit # [id number]     Status Ready     Product ID Platelets     PRODUCT CODE [id number]        Imaging: Uploads from OSH are pending    EKG: EKG was personally reviewed and noted to be and unchanged from previous tracing     Scribe's Attestation: Zannie Cove, MD obtained and performed the history, physical exam and medical decision making elements that were  entered into the chart. Documentation assistance was provided by me personally, a scribe. Signed by Esperanza Sheets, scribe, on January 10, 2019 at 9:17 PM.       I personally performed the history, physical exam,  and medical decision making.  The portions of the above document transcribed by the scribe were the History of Present Illness, Allergies, Medications, Medical History, Surgical History, Social History, and Review of Systems; I have reviewed and edited these for accuracy.  The other portions including the Physical Exam and Assessment/Plan were dictated by me.     Zannie Cove, MD  10:29 PM

## 2019-01-11 NOTE — Unmapped (Signed)
Patient rounds completed. The following patient needs were addressed:  Pain, Toileting, Personal Belongings, Plan of Care, Call Bell in Reach and Bed Position Low     Pt reassessment unchanged from previous. Pt linens changed and new gown placed. Mouthcare provided.

## 2019-01-11 NOTE — Unmapped (Signed)
Pt had a bowel movement, pt was cleaned, turned, repositioned, and given new clean linens, Patient rounding complete, call bell in reach, bed locked and in lowest position, patient belongings at bedside and within reach of patient.  Patient updated on plan of care.

## 2019-01-11 NOTE — Unmapped (Signed)
-----------------------------------------    7:10 AM (January 11, 2019)  -----------------------------------------  Assumed pt care from previous resident. Briefly, this is a 68 y.o. male with a pmhx of AML w/ persistent pancytopenia, CKD, HTN, DM, HLD, recent culture negative endocarditis presenting with PNA, weakness and worsening leukopenia.      BP 122/64  - Pulse 105  - Temp 37.2 ??C (98.9 ??F) (Oral) Comment: unit completion - Resp 20  - SpO2 96%     PLAN:  --Bilateral PNA: on Vanc/Cefe  --SDH. Chronic, NSG consulted  --AML. Continue valtrex and posaconazole ppx  --Type II NSTEMI and pericardial effusion being monitored

## 2019-01-11 NOTE — Unmapped (Signed)
Patient [name] lInk from [facility] with reports of generalized weakness, abnormal labs and new SDH and possible PNA. History AML which he is in treatment for at this time.

## 2019-01-11 NOTE — Unmapped (Signed)
Pt resting comfortably, platelets infusing, no signs of reaction at this time Patient rounding complete, call bell in reach, bed locked and in lowest position, patient belongings at bedside and within reach of patient.  Patient updated on plan of care.

## 2019-01-11 NOTE — Unmapped (Signed)
NEUROSURGERY CONSULT NOTE    Requesting Attending Physician:  Wendelyn Breslow, MD  Service Requesting Consult:  Emergency Medicine    Assessment and Recommendations  Kyle Huffman is a 68 y.o. male who presents after a fall. CT head shows a very small chronic left SDH with a acute cortical contusion without midline shift. He takes ASA but denies any other blood thinners. Neurologically his exam is reassuring and he remains intact. Given his stable neurologic status, no neurosurgical intervention is indicated at this time. Additionally, he has significant medical co-morbidities that likely require additional work-up from a medical standpoint. His platelets are critically too low to warrant any surgical intervention.     -No neurosurgical intervention or follow-up required at this time  -Please inform us if there is any change to his current neurologic status    Patient was discussed with Scot Jun, MD and staffed with Deb A. Bhowmick, MD. Please page the Neurosurgery consult pager at [phone] with questions/concerns.     Problem List  Active Problems:    * No active hospital problems. *  Resolved Problems:    * No resolved hospital problems. *      History of Present Illness  Kyle Huffman is a 68 y.o.  male with PMH significant for AML who is seen in consultation at the request of Wendelyn Breslow, MD for evaluation of SDH. He reports falling at his nursing home from bed height today. He did not loose consciousness. He denies any neurologic symptoms of weakness, numbness, changes in vision, nausea, vomiting, incontinence.     Review of Systems  A 10-system review of systems was conducted and was negative except as documented above in the HPI.  ______________________________________________________________________    Physical Exam  General: No acute distress; There is no height or weight on file to calculate BMI.    Neurological Exam:  Eyes open spontaneously  Oriented to name, location, time Pupils equal and reactive bilaterally  Extraocular movements intact bilaterally  Face symmetric  Tongue protrudes in the midline  Full shrug bilaterally  Motor strength 5/5 x 4  No pronator drift    Cardiovascular: Warm and well perfused with good pulses, no edema  Pulmonary:  Unlabored breathing, no pursed lips or wheezing, acyanotic  Abdomen: Soft, nontender, nondistended    Neurological imaging reviewed  CT head - as described above    The patient's available vitals, intake/output, medications, labs, and relevant neurological imaging were independently reviewed.    ---Historical data---    Allergies  Patient has no known allergies.    Medications  Reviewed in Epic.    Past Medical History  Past Medical History:   Diagnosis Date   ??? Diabetes mellitus (CMS-[name])    ??? Hyperlipidemia    ??? Hypertension    ??? Stroke (CMS-[name])        Past Surgical History  Past Surgical History:   Procedure Laterality Date   ??? CATARACT EXTRACTION     ??? TONSILLECTOMY         Family History  Family History   Problem Relation Age of Onset   ??? Heart disease Father    ??? Hypertension Father    ??? Diabetes Father    ??? Brain cancer Mother    ??? Heart disease Sister        Social History  Social History     Tobacco Use   ??? Smoking status: Former Smoker     Packs/day: 0.50  Years: 25.00     Pack years: 12.50     Types: Cigarettes     Last attempt to quit: 2000     Years since quitting: 20.1   ??? Smokeless tobacco: Former Neurosurgeon     Types: Snuff   Substance Use Topics   ??? Alcohol use: Not Currently   ??? Drug use: Never

## 2019-01-11 NOTE — Unmapped (Signed)
Malignant Hematology Consult H&P    Service requesting consult: Hospitalist  Referring Physician: Salli Quarry, MD  Reason for Consult: acute myeloid leukemia  Primary Oncologist: Dr. 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  He also has history of recent culture negative endocarditis s/p treatment.  He presents to the hospital with diffuse weakness and new subdural hematoma in the setting of a recent fall.  We have been asked to consult regarding patient's acute leukemia and associated cytopenias.    Regarding his leukemia, patient was diagnosed 10/2018 with intermediate risk cytogenetics and complex abnormalities (mutations in NPM1, ASXL1, CBL, SRSF2, and TET2), likely from preexisting CMML or MDS/MPN overlap.  He underwent 2 cycles of aza/venetoclax  without response and with persistent pancytopenia, now felt to have adverse-risk disease.  Given his monocytic phenotype, he received an LP with with IT chemo on 1/15 that was equivocal.  He ultimately felt to not have CNS disease.  Most recent bone marrow showed persistent involvement of AML (86% blasts by manual) with abnormal karyotype and  NPM1+. Prior to this admission, the tentative plan was for clinical trial on TPI-202, a study of alvocidib +/- LDAC for patients who have failed to respond or relapsed on first line therapy with aza/venetoclax.      At this point in time, we are quite worried about the patient's performance status (ECOG 4).  He has had significant decline and is largely bed-bound.  While some of this decline may be related to acute issues, more likely, this is result of his refractory leukemia.  Although he has not faced much treatment, we worry that any additional cancer-directed therapy will be too toxic in a patient who has limited functional status.  We discussed with the patient providing a comfort-based focus with hospice, and patient agreed that it is time to stop cancer-directed therapies and transition to hospice.  We would like to involve patient's brother, who is extremely supportive as we finalize the logistics for hospice enrollment.         Problem List:   1) Subdural hematoma, likely chronic  2) AML, initially intermediate-risk cytogenetics, now adverse-risk s/p 2 cycles of aza/venetoclax without response and with persistent pancytopenia.   3) Pericardial effusion  4) Bicytopenia (normocytic anemia and thrombocytopenia)   5) Chest wall hematoma  6) Possible bilateral pneumonia  7) Acute on chronic kidney disease  8) Mild transaminitis  9) Hx of culture negative endocarditis    Recommendations:  - Goals of care with the patient - he would like to proceed with hospice enrollment  - We will discuss with patient's brother tomorrow.    - In the meantime, please continue with supportive interventions and maintain platelets > 50 g/dL in setting of subdural hematoma     This patient has been staffed with Dr. Leotis Pain. These recommendations were discussed with the primary team.     Please contact the hematology team at [phone] with any further questions.    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  He also has history of recent culture negative endocarditis s/p treatment.  He presents to the hospital with diffuse weakness and new subdural hematoma in the setting of a recent fall.  We have been asked to consult regarding patient's acute leukemia.    PAtient's brother had arrived at patient's SNF on morning of presentation to take him to scheduled transfusion.  The patient could not get out of bed due to weakness and fatigue.   His brother called his oncologist, who advised the patient go to the ED. Upon returning to the patient's room, his brother found the patient had fallen and was on the floor with an abrasion to the head. Patient remembers the fall completely.  At OSH ED, he was found to be thrombocytopenic and anemic.  He was given 1 unit of PRBCs and 1 unit of platelets.  At OSH, CT head showed 4 mm subdural hematoma with no midline shift. CT C/A/P showed possobile bilateral pneumonia and bilateral effusions, as well as small pericardial effusion.        Upon arrival to [facility], NSGY has evaluated and felt subdural to be chronic and not in need of intervention.  Patient was started on vanc/cefepeime empirically.  However, exam reveals large chest wall ecchymosis.      Regarding his leukemia, patient was diagnosed 10/2018 with intermediate risk cytogenetics and complex abnormalities with mutations in NPM1, ASXL1, CBL, SRSF2, and TET2, likely from preexisting CMML or MDS/MPN overlap.  He underwent 2 cycles of aza/venetoclax (10/31/18 and 11/29/18) without response and with persistent pancytopenia, now felt to have adverse-risk disease.  Most recent bone marrow on 12/25/18 showed hypercellular marrow with persistent involvement of AML (86% blasts by manual) with abnormal karyotype and  NPM1+    Given his monocytic phenotype, he received an LP with with IT chemo on 1/15 that was equivocal.  Repeat LP on 1/23 was a bloody sample.  A rare blast was identified and thought to be contaminant from peripheral blood.  In light of this, we believe he does not have CNS disease.      Plan is for clinical trial on TPI-202, a study of alvocidib +/- LDAC for patients who have failed to respond or relapsed on first line therapy with aza/venetoclax.  He has not yet consented for clinical trial.         Of note, Mr. Dunlap resides in a SNF and uses a walker to ambulate.  He has been eating poorly and lost weight.  He has been unable to perform ADLs.  He has also been transfusion dependent related to his pancytopenia.        PAST MEDICAL HISTORY      Past Medical History:   Diagnosis Date   ??? Diabetes mellitus (CMS-[name])    ??? Hyperlipidemia    ??? Hypertension    ??? Stroke (CMS-[name])        PAST SURGICAL HISTORY     Past Surgical History:   Procedure Laterality Date   ??? CATARACT EXTRACTION     ??? TONSILLECTOMY         SOCIAL HISTORY     Social History     Tobacco Use   Smoking Status Former Smoker   ??? Packs/day: 0.50   ??? Years: 25.00   ??? Pack years: 12.50   ??? Types: Cigarettes   ??? Last attempt to quit: 2000   ??? Years since quitting: 20.1   Smokeless Tobacco Former Neurosurgeon   ???  Types: Snuff     Social History     Substance and Sexual Activity   Alcohol Use Not Currently     Social History     Social History Narrative        Patient is currently a resident at [facility] in [place], [place]. Social support includes his brother.                FAMILY HISTORY     Family History   Problem Relation Age of Onset   ??? Heart disease Father    ??? Hypertension Father    ??? Diabetes Father    ??? Brain cancer Mother    ??? Heart disease Sister        MEDICATIONS and ALLERGIES     No current facility-administered medications on file prior to encounter.      Current Outpatient Medications on File Prior to Encounter   Medication Sig Dispense Refill   ??? amLODIPine (NORVASC) 10 MG tablet Take 1 tablet (10 mg total) by mouth daily. 90 tablet 3   ??? celecoxib (CELEBREX) 200 MG capsule Take 200 mg by mouth daily.     ??? gabapentin (NEURONTIN) 100 MG capsule Take 100 mg by mouth nightly.     ??? insulin glargine (LANTUS) 100 unit/mL injection Inject 0.15 mL (15 Units total) under the skin nightly. 4.5 mL 0   ??? insulin lispro (HUMALOG) 100 unit/mL injection Inject 0-0.12 mL (0-12 Units total) under the skin Four (4) times a day (before meals and nightly). See discharge summary for titration. 20 mL 0   ??? levoFLOXacin (LEVAQUIN) 250 MG tablet Take 1 tablet (250 mg total) by mouth daily. 30 tablet 3   ??? OLANZapine zydis (ZYPREXA) 5 MG disintegrating tablet Take 0.5 tablets (2.5 mg total) by mouth nightly. 15 tablet 0   ??? ondansetron (ZOFRAN-ODT) 4 MG disintegrating tablet Take 1 tablet (4 mg total) by mouth every eight (8) hours as needed for nausea. 30 tablet 0   ??? ONETOUCH VERIO Strp      ??? posaconazole (NOXAFIL) 100 mg TbEC delayed released tablet Take 3 TABLETS (300 mg) by mouth daily. 90 tablet 3   ??? pravastatin (PRAVACHOL) 10 MG tablet Take 1 tablet (10 mg total) by mouth nightly. 30 tablet 0   ??? sevelamer (RENVELA) 800 mg tablet Take 1 tablet (800 mg total) by mouth Three (3) times a day with a meal. 30 tablet 0   ??? traMADol (ULTRAM) 50 mg tablet Take 50 mg by mouth daily.     ??? valACYclovir (VALTREX) 500 MG tablet Take 1 tablet (500 mg total) by mouth daily.  0   ??? [EXPIRED] micafungin 50 mg, OVERFILL 10 mL in sodium chloride 0.9 % 100 mL IVPB Infuse 50 mg into a venous catheter daily. 1 each 0       REVIEW OF SYSTEMS     A 10-point review of systems was asked and negative except as noted in the HPI    PHYSICAL EXAM:      Vitals:    01/11/19 0856   BP: 113/57   Pulse: 105   Resp: 18   Temp:    SpO2: 96%       General: Lying slumped down in bed, in pile of his stool (incontinent right before we entered the room, nurse helped change him immediately afterward)  HEENT: OP clear, MMM, no petichiae   Heart: RRR, no m/g/r, normal S1 and S2  Chest: breathing comfortably on RA,  CTABL  Abd: soft, non-tender. BS + No HSM.   Ext: no clubbing or edema  Neuro: Grossly intact No FND     Patient Lines/Drains/Airways Status    Active Peripheral & Central Intravenous Access     Name:   Placement date:   Placement time:   Site:   Days:    Peripheral IV 01/10/19 Left Wrist   01/10/19    0000    Wrist   1    PICC Single Lumen 11/07/18 Right Basilic   11/07/18    1215    Basilic   64                ECOG PERFORMANCE STATUS: 4 = Completely disabled. Cannot carry on any selfcare. Totally confined to bed or chair    LABORATORY and RADIOLOGY DATA:      Pertinent Laboratory Data:  Lab Results   Component Value Date    WBC 8.5 01/11/2019    HGB 7.3 (L) 01/11/2019    HCT 21.4 (L) 01/11/2019    PLT 102 (L) 01/11/2019     Lab Results   Component Value Date    NA 135 01/11/2019    K 3.9 01/11/2019    CL 102 01/11/2019    CO2 23.0 01/11/2019     Lab Results   Component Value Date    ALKPHOS 321 (H) 01/11/2019    BILITOT 0.9 01/11/2019    PROT 4.6 (L) 01/11/2019    ALBUMIN 2.3 (L) 01/11/2019    ALT 68 (H) 01/11/2019    AST 134 (H) 01/11/2019     Lab Results   Component Value Date    PT 15.1 (H) 01/11/2019    PT 15.6 (H) 01/10/2019    PT 14.7 (H) 11/12/2018    INR 1.31 01/11/2019    INR 1.35 01/10/2019    INR 1.27 11/12/2018       Creatinine   Date Value Ref Range Status   01/11/2019 1.83 (H) 0.70 - 1.30 mg/dL Final   78/46/9629 5.28 (H) 0.70 - 1.30 mg/dL Final   41/32/4401 0.27 (H) 0.70 - 1.30 mg/dL Final     Calcium   Date Value Ref Range Status   01/11/2019 6.5 (L) 8.5 - 10.2 mg/dL Final   25/36/6440 6.6 (L) 8.5 - 10.2 mg/dL Final   34/74/2595 8.2 (L) 8.5 - 10.2 mg/dL Final     Albumin   Date Value Ref Range Status   01/11/2019 2.3 (L) 3.5 - 5.0 g/dL Final   63/87/5643 2.3 (L) 3.5 - 5.0 g/dL Final   32/95/1884 3.2 (L) 3.5 - 5.0 g/dL Final     LDH   Date Value Ref Range Status   11/12/2018 646 (H) 338 - 610 U/L Final   11/09/2018 928 (H) 338 - 610 U/L Final   11/08/2018 1,026 (H) 338 - 610 U/L Final     Total Protein   Date Value Ref Range Status   01/11/2019 4.6 (L) 6.5 - 8.3 g/dL Final   16/60/6301 4.6 (L) 6.5 - 8.3 g/dL Final   60/09/9322 5.8 (L) 6.5 - 8.3 g/dL Final     Pertinent Imaging:

## 2019-01-11 NOTE — Unmapped (Signed)
Patient rounds completed. The following patient needs were addressed:  Plan of Care, Call Bell in Reach and Bed Position Low .    Pt in NAD. Family remains at bedside. Respirations regular and unlabored, chest expansion symmetrical. No needs or concerns identified. VSS. Call bell within reach. Will continue to monitor.

## 2019-01-12 DIAGNOSIS — D696 Thrombocytopenia, unspecified: Principal | ICD-10-CM

## 2019-01-12 LAB — PREPARE RBC (CROSSMATCH)

## 2019-01-12 LAB — COMPREHENSIVE METABOLIC PANEL
ALBUMIN: 2.1 g/dL — ABNORMAL LOW (ref 3.5–5.0)
ALT (SGPT): 53 U/L — ABNORMAL HIGH (ref <50)
ANION GAP: 10 mmol/L (ref 7–15)
AST (SGOT): 110 U/L — ABNORMAL HIGH (ref 19–55)
BILIRUBIN TOTAL: 0.6 mg/dL (ref 0.0–1.2)
BLOOD UREA NITROGEN: 40 mg/dL — ABNORMAL HIGH (ref 7–21)
BUN / CREAT RATIO: 21
CALCIUM: 6.7 mg/dL — ABNORMAL LOW (ref 8.5–10.2)
CHLORIDE: 104 mmol/L (ref 98–107)
CO2: 22 mmol/L (ref 22.0–30.0)
EGFR CKD-EPI AA MALE: 40 mL/min/{1.73_m2} — ABNORMAL LOW (ref >=60)
EGFR CKD-EPI NON-AA MALE: 35 mL/min/{1.73_m2} — ABNORMAL LOW (ref >=60)
GLUCOSE RANDOM: 87 mg/dL (ref 70–179)
POTASSIUM: 3.6 mmol/L (ref 3.5–5.0)
PROTEIN TOTAL: 4.3 g/dL — ABNORMAL LOW (ref 6.5–8.3)
SODIUM: 136 mmol/L (ref 135–145)

## 2019-01-12 LAB — CBC W/ AUTO DIFF
BASOPHILS ABSOLUTE COUNT: 0 10*9/L (ref 0.0–0.1)
BASOPHILS RELATIVE PERCENT: 0.2 %
EOSINOPHILS ABSOLUTE COUNT: 0 10*9/L (ref 0.0–0.4)
HEMATOCRIT: 18.4 % — ABNORMAL LOW (ref 41.0–53.0)
LARGE UNSTAINED CELLS: 75 % — ABNORMAL HIGH (ref 0–4)
LYMPHOCYTES ABSOLUTE COUNT: 1.1 10*9/L — ABNORMAL LOW (ref 1.5–5.0)
MEAN CORPUSCULAR HEMOGLOBIN CONC: 34.5 g/dL (ref 31.0–37.0)
MEAN CORPUSCULAR HEMOGLOBIN: 29.5 pg (ref 26.0–34.0)
MEAN CORPUSCULAR VOLUME: 85.3 fL (ref 80.0–100.0)
MEAN PLATELET VOLUME: 8.8 fL (ref 7.0–10.0)
MONOCYTES ABSOLUTE COUNT: 0.3 10*9/L (ref 0.2–0.8)
MONOCYTES RELATIVE PERCENT: 4.7 %
NEUTROPHILS ABSOLUTE COUNT: 0.2 10*9/L — CL (ref 2.0–7.5)
NEUTROPHILS RELATIVE PERCENT: 3.7 %
RED BLOOD CELL COUNT: 2.16 10*12/L — ABNORMAL LOW (ref 4.50–5.90)
RED CELL DISTRIBUTION WIDTH: 15.8 % — ABNORMAL HIGH (ref 12.0–15.0)
WBC ADJUSTED: 6.5 10*9/L (ref 4.5–11.0)

## 2019-01-12 LAB — RED BLOOD CELL COUNT: Lab: 2.16 — ABNORMAL LOW

## 2019-01-12 LAB — MAGNESIUM: Magnesium:MCnc:Pt:Ser/Plas:Qn:: 1.8

## 2019-01-12 LAB — PHOSPHORUS: Phosphate:MCnc:Pt:Ser/Plas:Qn:: 3.4

## 2019-01-12 LAB — PROTIME-INR
INR: 1.27
PROTIME: 14.7 s — ABNORMAL HIGH (ref 10.2–13.1)

## 2019-01-12 LAB — SLIDE REVIEW

## 2019-01-12 LAB — INR: Lab: 1.27

## 2019-01-12 LAB — POIKILOCYTES

## 2019-01-12 LAB — EGFR CKD-EPI NON-AA MALE: Lab: 35 — ABNORMAL LOW

## 2019-01-12 NOTE — Unmapped (Signed)
Patient AOx4, VSS, afebrile. Patient unsteady on feet though he thought he was ambulatory.  Bedpan and urinal to be used until PT/OT consult done.  Patient able to take meds with applesauce.  No falls or acute events this shift. Safety measures in place of bed low with brakes locked, non-skid footwear on while out of bed, call bell within reach.  Will CTM.      Problem: Adult Inpatient Plan of Care  Goal: Plan of Care Review  Outcome: Ongoing - Unchanged  Goal: Patient-Specific Goal (Individualization)  Outcome: Ongoing - Unchanged  Goal: Absence of Hospital-Acquired Illness or Injury  Outcome: Ongoing - Unchanged  Goal: Optimal Comfort and Wellbeing  Outcome: Ongoing - Unchanged  Goal: Readiness for Transition of Care  Outcome: Ongoing - Unchanged  Goal: Rounds/Family Conference  Outcome: Ongoing - Unchanged     Problem: Diabetes Comorbidity  Goal: Blood Glucose Level Within Desired Range  Outcome: Ongoing - Unchanged     Problem: Infection (Pneumonia)  Goal: Resolution of Infection Signs/Symptoms  Outcome: Ongoing - Unchanged     Problem: Self-Care Deficit  Goal: Improved Ability to Complete Activities of Daily Living  Outcome: Ongoing - Unchanged     Problem: Fall Injury Risk  Goal: Absence of Fall and Fall-Related Injury  Outcome: Ongoing - Unchanged     Problem: Skin Injury Risk Increased  Goal: Skin Health and Integrity  Outcome: Ongoing - Unchanged     Problem: Infection  Goal: Infection Symptom Resolution  Outcome: Ongoing - Unchanged

## 2019-01-12 NOTE — Unmapped (Addendum)
Care Management  Initial Transition Planning Assessment      SW met with the pt at bedside to obtain the information needed to complete his assessment. Pt was alert and oriented xs 4 and the SW didn't have any trouble engaging the pt in conversation. Pt stated that he lives at home alone and prior to his admission, he was independent with his ADLs at baseline and ambulatory with a single point cane. He has no other HME and he is currently retired. When SW asked pt about who would assist him at home, pt informed her that he has his brother but they have both discussed the fact that he wouldn't go home b/c this was the end for him.    SW provided the pt with presence and emotional support as the two of them discussed his feelings towards knowing that he might not leave the hospital. Pt was at peace. He has no advanced directives and if he has to go to hospice he prefers [facility]. SW made pt aware that he would be in her thoughts and he thanked her for that. Encounter concluded at this time and transportation for the pt will be determined at d/c.              General  Care Manager assessed the patient by : In person interview with patient  Orientation Level: Oriented X4  Reason for referral: Discharge Planning    Contact/Decision Maker  Extended Emergency Contact Information  Primary Emergency Contact: Domingos,thomas  Mobile Phone: [phone]  Relation: Brother  Preferred language: ENGLISH  Interpreter needed? No    Legal Next of Kin / Guardian / POA / Advance Directives       Advance Directive (Medical Treatment)  Does patient have an advance directive covering medical treatment?: Patient does not have advance directive covering medical treatment.  Reason patient does not have an advance directive covering medical treatment:: Patient does not wish to complete one at this time  Information provided on advance directive:: Other (Comment)(Per Pt, My brother knows)  Patient requests assistance:: No Advance Directive (Mental Health Treatment)  Does patient have an advance directive covering mental health treatment?: Unable to assess (Pt. cognitively impaired, and/or unaccompanied).    Patient Information  Lives with: Alone    Type of Residence: Private residence             Support Systems: Family Members    Responsibilities/Dependents at home?: No    Home Care services in place prior to admission?: No                  Equipment Currently Used at Home: cane, straight       Currently receiving outpatient dialysis?: N/A       Financial Information       Need for financial assistance?: No       Social Determinants of Health  Social Determinants of Health were addressed in provider documentation.  Please refer to patient history.    Discharge Needs Assessment  Concerns to be Addressed: no discharge needs identified    Clinical Risk Factors: > 65, Principal Diagnosis: Cancer, Stroke, COPD, Heart Failure, AMI, Pneumonia, Joint Replacment    Barriers to taking medications: No    Prior overnight hospital stay or ED visit in last 90 days: No    Readmission Within the Last 30 Days: no previous admission in last 30 days    Patient's Choice of Community Agency(s): N/A    Anticipated Changes Related to  Illness: other (see comments)(TBD)    Equipment Needed After Discharge: other (see comments)(TBS)    Discharge Facility/Level of Care Needs: other (see comments)(TBD)    Readmission  Risk of Unplanned Readmission Score: UNPLANNED READMISSION SCORE: 24%  Predictive Model Details           25% (High) Factors Contributing to Score   Calculated 01/12/2019 15:21 18% Number of active Rx orders is 40    Risk of Unplanned Readmission Model 13% Active NSAID Rx order is present     7% Diagnosis of cancer is present     6% Active antipsychotic Rx order is present     6% ECG/EKG order is present in last 6 months     6% Latest calcium is low (6.7 mg/dL)     5% Latest BUN is high (40 mg/dL)     5% Encounter of ten days or longer in last year is present     Readmitted Within the Last 30 Days? (No if blank)   Patient at risk for readmission?: No    Discharge Plan  Screen findings are: Care Manager reviewed the plan of the patient's care with the Multidisciplinary Team. No discharge planning needs identified at this time. Care Manager will continue to manage plan and monitor patient's progress with the team.    Expected Discharge Date: 01/14/19    Expected Transfer from Critical Care:      Patient and/or family were provided with choice of facilities / services that are available and appropriate to meet post hospital care needs?: N/A       Initial Assessment complete?: Yes    Type of Residence: Mailing Address:  [address]  [place] [address]  Contacts:    Patient Phone Number: [phone] (home)         Medical Provider(s): Lauro Regulus  Reason for Admission: Admitting Diagnosis:  No admission diagnoses are documented for this encounter.  Past Medical History:   has a past medical history of Diabetes mellitus (CMS-[name]), Hyperlipidemia, Hypertension, and Stroke (CMS-[name]).  Past Surgical History:   has a past surgical history that includes Tonsillectomy and Cataract extraction.   Previous admit date: 10/26/2018    Primary Insurance- Payor: AETNA MEDICARE ADV / Plan: AETNA MEDICARE ADV / Product Type: *No Product type* /   Secondary Insurance ??? None  Prescription Coverage ??? Nurse, learning disability listed above  Preferred Pharmacy - [facility] - [place], [place] - [address] AT [address]AL TO [address]  [id number] [place], [place] - [address]  [facility]    Transportation home: TBD  Level of function prior to admission: Requires Assistance of a straight point cane

## 2019-01-12 NOTE — Unmapped (Signed)
Pt alert and oriented x3, but forgetful.  Bed alarm on d/t poor judgement and high falls risk.  Educated on q2hour turning; pt often refuses and makes frequent movements independently in bed.  Bedrest until PT/OT evaluates, d/t increased weakness.  VSS and afebrile. Will continue to monitor.

## 2019-01-12 NOTE — Unmapped (Signed)
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 Kyle Huffman is a 68 y.o. male with PMHx of acute myeloid leukemia,??failed first line treatment??s/p 2 cycles of Azacitidine + Venetoclax without response and persistent pancytopenia, CKD, hypertension, diabetes mellitus, hyperlipidemia, and recent admission for culture negative endocarditis and remote stroke presenting to [facility] with weakness, pneumonia, acute vs chronic SDH, pancytopenia with neutropenia  ??  Bilateral Pneumonia with neutropenia - Per OSH CT chest concern for inspissated secretions causing consolidation/atelectasis of LLL and debris noted in the bronchus.  In the setting of neutropenia. Pressures initially low but stabilizing with blood, fluids and antibiotics. No oxygen requirement.Patient was given broad spectrum antibiotics at OSH.   - Will continue broad coverage for now, narrow if blood cx neg x48 hrs  - Follow up on [facility] blood cultures and [facility] [phone]  - Vanc/cefepime, can add flagyl if not improving as expected   - Consider PICC exchange if blood cultures positive  - consider pulm c/s if not improving for consideration of bronch  - incentive spirometry  - avoiding chest pt given cytopenias and chest wall contusion  - consider saline nebs if not improving    Subdural hematoma with acute cortical contusion- Visualized on OSH imaging earlier today.The patient was seen by neurosurgery in the ED. No surgical intervention at this time. Thought to be a chronic subdural and not clinically significant.  - CTM  - Platelet repletion as below goal >50  ??  AML- Concern for disease progression on scans, smear with blasts and clinically. Plan with oncology is to enroll in clinical trial TPI-202 (study of alvocidib +/- LDAC for patients that have failed to respond or relapsed on first line therapy of aza/venetoclax).  - Heme/onc consult appreciated.  - Daily CBC, Hg goal > 7, platelet goal >50 given subdural (30 if 50 is not possible)-- this is per discussion w/ covering onc fellow  - Continue valtrex and posaconazole ppx  - Hold levaquin PPx while on vanc/cefepime  ??  Elevated troponin: Likely demand in the setting of infection. Downtrending.  ??  Elevated Transaminases: Bili stable. Suspect this is related to systemic infection as well. Downtrending.  ??  Pericardial Effusion: Does not appear to be hemodynamically significant.  - Follow up ECHO ordered  ??  Hx of Cx negative aortic valve endocarditis: Completed therapy, now on oral PPX  - Blood cx as above  - f/u echo as above  ??  AKI vs progression of CKD- Creatinine 1.9 from BL 1.4, improving  - Fluids as needed  - Continue sevelamer  ??  Hyperlipidemia- Pravastatin  ??  DM Type 2- Patient is unsure of his current medication list. He states he receives insulin once a night at his nursing home. Will start with decreased dose of lantus and titrate  as needed.  - Lantus 10 units nightly  - SSI PRN  ??  HTN- Hold home amlodipine while acutely ill, restart as able    Coagulopathy - INR mildly elevated 1.3, likely nutritional. Po vitamin K 5mg  x3 days.   ??  Code Status:  Full code- Patient wound like to be full code. Denied he ever had a previous DNR/DNI. This was discussed at length with the patient on admission with his brother and POA present by Dr. Mordecai Rasmussen.   ??  POA: Brother. Kyle Huffman. [phone] ___________________________________________________________________    Subjective:  Seen in ED, feeling alright, denies headache, vision changes, weakness, numbness or difficulty with speech. His brother at the bedside endorses that he is much improved from when he found him in bed at the ALF yesterday. After exam was notable for large ecchymosis on the R side, the patient endorsed a fall about 2 days PTA (in addition to the fall onto his head leading to admission).     Labs/Studies:  Labs and Studies from the last 24hrs per EMR and Reviewed    Objective:  Temp:  [35.9 ??C (96.6 ??F)-37.4 ??C (99.4 ??F)] 36 ??C (96.8 ??F)  Heart Rate:  [100-117] 106  SpO2 Pulse:  [100-114] 104  Resp:  [16-26] 16  BP: (107-166)/(28-95) 133/63  SpO2:  [94 %-100 %] 98 %,   Intake/Output Summary (Last 24 hours) at 01/11/2019 1625  Last data filed at 01/11/2019 0741  Gross per 24 hour   Intake 1617.67 ml   Output ???   Net 1617.67 ml   ,   Wt Readings from Last 3 Encounters:   01/02/19 61 kg (134 lb 9.5 oz)   01/02/19 60.4 kg (133 lb 3.2 oz)   12/27/18 63.3 kg (139 lb 9.6 oz)       GEN: A&Ox3, NAD, lying in hospital bed, pale  HEENT: MMM, sclerae anicteric. Small abrasion/ecchymosis on R frontal area.   CV: RRR s1 s2 no m/g/r, no peripheral edema  LUNGS: diminished BS at bases, otherwise CTA without rales/wheeze  ABD: soft, NT, ND, no masses  NEURO: alert and interactive, face symmetric, MAEE  SKIN: Large ecchymosis over posterior R ribs/flank  LINES: RUE PICC w/dressing intact, c/d, no surrounding erythema    Blood cx [facility] - 2/6 Periph x2 NGTD  Blood cx [place] - 2/6 periph x2 NGTD        [place] OVERREAD OF OSH CHEST CT  Left lower lobe opacity with volume loss, most likely atelectasis. Debris within the left lower lobe bronchus.  Scattered patchy pulmonary parenchymal opacities likely infectious.  Subcarinal and right cardiophrenic adenopathy likely reactive.  Small left and trace right pleural effusions. Small pericardial effusion.    CT ABD/PELVIS:  - Interval increase in the size of  enlarged retroperitoneal and gastrohepatic ligament lymph nodes consistent.   - Diffuse osseous sclerotic lesions involving the ribs, spine and pelvic bones and bilateral femurs. This could be secondary to patient's MDS although metastatic disease is also in the differential.  -The kidneys demonstrate ill-defined borders and bilateral renal renal collecting systems are obscured. The lesion is limited due to technique; however, this could be secondary to infiltrative disease such as leukemic infiltration.

## 2019-01-13 NOTE — Unmapped (Signed)
Nutrition consult received and appreciated. Patient on comfort measures, therefore nutrition will sign off. Please reconsult if condition changes.     Lavella Lemons, MS, RD, LDN  Pager: [phone]

## 2019-01-13 NOTE — Unmapped (Signed)
Patient AOx4, VSS, afebrile. Patient seen in ECHO today.  Patient and brother had goals of care discussion with MDs and patient chose to change code status and go comfort care with the goal of discharging to hospice.  No falls or acute events this shift. Safety measures in place of bed low with brakes locked, non-skid footwear on while out of bed, call bell within reach.  Will CTM.      Problem: Adult Inpatient Plan of Care  Goal: Plan of Care Review  Outcome: Ongoing - Unchanged  Goal: Patient-Specific Goal (Individualization)  Outcome: Ongoing - Unchanged  Goal: Absence of Hospital-Acquired Illness or Injury  Outcome: Ongoing - Unchanged  Goal: Optimal Comfort and Wellbeing  Outcome: Ongoing - Unchanged  Goal: Readiness for Transition of Care  Outcome: Ongoing - Unchanged  Goal: Rounds/Family Conference  Outcome: Ongoing - Unchanged     Problem: Diabetes Comorbidity  Goal: Blood Glucose Level Within Desired Range  Outcome: Ongoing - Unchanged     Problem: Infection (Pneumonia)  Goal: Resolution of Infection Signs/Symptoms  Outcome: Ongoing - Unchanged     Problem: Self-Care Deficit  Goal: Improved Ability to Complete Activities of Daily Living  Outcome: Ongoing - Unchanged     Problem: Fall Injury Risk  Goal: Absence of Fall and Fall-Related Injury  Outcome: Ongoing - Unchanged     Problem: Skin Injury Risk Increased  Goal: Skin Health and Integrity  Outcome: Ongoing - Unchanged     Problem: Infection  Goal: Infection Symptom Resolution  Outcome: Ongoing - Unchanged

## 2019-01-13 NOTE — Unmapped (Signed)
Pt is active comfort care. No distress noted - denies pain. Bed alarm in use - will monitor

## 2019-01-13 NOTE — Unmapped (Signed)
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 Kyle Huffman is a 68 y.o. male with PMHx of acute myeloid leukemia,??failed first line treatment??s/p 2 cycles of Azacitidine + Venetoclax without response and persistent pancytopenia, CKD, hypertension, diabetes mellitus, hyperlipidemia, and recent admission for culture negative endocarditis and remote stroke presenting to [facility] with weakness, pneumonia, acute vs chronic SDH, pancytopenia with neutropenia.    Comfort focused care He has decided to pursue hospice directed care given his AML has not responded to treatment and now is complicated by transfusion dependent pancytopenia and subdural hematoma.   - Stop lab checks  - Can discuss transfusion for comfort  - Oxycodone for pain  - Lorazepam for anxiety  - Haloperidol for agitation  - Appreciate CM help with hospice discharge planning / family and patient interested in residential or inpatient hospice depending on what he qualifies for    Possible Bilateral Pneumonia with neutropenia - Per OSH CT chest concern for inspissated secretions causing consolidation/atelectasis of LLL and debris noted in the bronchus.  In the setting of neutropenia. No active pulmonary symptoms and given poor overall prognosis and his goal to pursue inpatient hospice have decided to stop antimicrobials.    - stop antimicrobials    Subdural hematoma with acute cortical contusion - Visualized on OSH imaging earlier today. The patient was seen by neurosurgery in the ED. No surgical intervention at this time. Thought to be a chronic subdural and not clinically significant. Platelets given with goal >50. Now with goal of hospice will stop checking CBC.  - CTM  ??  AML - Concern for disease progression on scans, smear with blasts and clinically. Plan with oncology is to enroll in clinical trial TPI-202 (study of alvocidib +/- LDAC for patients that have failed to respond or relapsed on first line therapy of aza/venetoclax). Appreciate oncology consult and agree with recommendation to pursue hospice care. He and his brother wish to pursue inpatient hospice even if they need to pay for residential status initially.   - Stop prophylaxis and CBC checks   - Consider additional pRBC transfusion before discharge  ??  Code Status: DNR/DNI  ??  POA: Brother. Kyle Huffman. [phone]  ___________________________________________________________________    Subjective:  He reports feeling well today, no complaints.     Labs/Studies:  Labs and Studies from the last 24hrs per EMR and Reviewed    Objective:  Temp:  [36.4 ??C (97.5 ??F)] 36.4 ??C (97.5 ??F)  Heart Rate:  [104] 104  Resp:  [20] 20  BP: (131)/(61) 131/61  SpO2:  [97 %] 97 %,   No intake or output data in the 24 hours ending 01/13/19 1518,   Wt Readings from Last 3 Encounters:   01/02/19 61 kg (134 lb 9.5 oz)   01/02/19 60.4 kg (133 lb  3.2 oz)   12/27/18 63.3 kg (139 lb 9.6 oz)       GEN: A&Ox3, NAD, lying in hospital bed, pale  HEENT: MMM, sclerae anicteric. Bandage on right forehead.   CV: RRR  LUNGS: Normal work of breathing  ABD: soft, NT, ND  NEURO: alert and interactive, face symmetric, MAEE  LINES: RUE PICC w/dressing intact, c/d, no surrounding erythema

## 2019-01-13 NOTE — Unmapped (Signed)
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 Kyle Huffman is a 68 y.o. male with PMHx of acute myeloid leukemia,??failed first line treatment??s/p 2 cycles of Azacitidine + Venetoclax without response and persistent pancytopenia, CKD, hypertension, diabetes mellitus, hyperlipidemia, and recent admission for culture negative endocarditis and remote stroke presenting to [facility] with weakness, pneumonia, acute vs chronic SDH, pancytopenia with neutropenia.    Comfort focused care He has decided to pursue hospice directed care given his AML has not responded to treatment and now is complicated by transfusion dependent pancytopenia and subdural hematoma.   - Stop lab checks  - Can discuss transfusion for comfort  - Oxycodone for pain  - Lorazepam for anxiety  - Haloperidol for agitation  - Appreciate CM help with hospice discharge planning / family and patient interested in residential or inpatient hospice depending on what he qualifies for    Bilateral Pneumonia with neutropenia - Per OSH CT chest concern for inspissated secretions causing consolidation/atelectasis of LLL and debris noted in the bronchus.  In the setting of neutropenia. No active pulmonary symptoms and given poor overall prognosis and his goal to pursue inpatient hospice have decided to stop antimicrobials.    - stop antimicrobials    Subdural hematoma with acute cortical contusion - Visualized on OSH imaging earlier today. The patient was seen by neurosurgery in the ED. No surgical intervention at this time. Thought to be a chronic subdural and not clinically significant. Platelets given with goal >50. Now with goal of hospice will stop checking CBC.  - CTM  ??  AML - Concern for disease progression on scans, smear with blasts and clinically. Plan with oncology is to enroll in clinical trial TPI-202 (study of alvocidib +/- LDAC for patients that have failed to respond or relapsed on first line therapy of aza/venetoclax). Appreciate oncology consult and agree with recommendation to pursue hospice care. He and his brother wish to pursue inpatient hospice even if they need to pay for residential status initially.   - Stop prophylaxis and CBC checks   - Consider additional pRBC transfusion before discharge  ??  Code Status: DNR/DNI, discussed today at bedside with patient and his brother  ??  POA: Brother. Thomas Vajda. [phone]    Floor time 35 minutes minutes, > 50% spent in counseling and coordination of care about the following issues:  transition to hospice, discussions with Family, Nurse and oncology. January 12, 2019 4:44 PM   ___________________________________________________________________    Subjective:  Discussed his plan of care with brother, patient and his RN. They are in agreement to pursue hospice care and wish to go to inpatient hospice; if he does not yet qualify they are willing to figure out residential hospice costs.     He agrees to stop medication and monitoring that are  directed at longer term treatment goals; insulin, ppx.     Labs/Studies:  Labs and Studies from the last 24hrs per EMR and Reviewed    Objective:  Temp:  [35.7 ??C (96.3 ??F)-37.2 ??C (99 ??F)] 36.4 ??C (97.5 ??F)  Heart Rate:  [97-111] 100  Resp:  [16-24] 24  BP: (116-135)/(56-92) 121/58  SpO2:  [94 %-99 %] 97 %,     Intake/Output Summary (Last 24 hours) at 01/12/2019 1630  Last data filed at 01/11/2019 2007  Gross per 24 hour   Intake ???   Output 200 ml   Net -200 ml   ,   Wt Readings from Last 3 Encounters:   01/02/19 61 kg (134 lb 9.5 oz)   01/02/19 60.4 kg (133 lb 3.2 oz)   12/27/18 63.3 kg (139 lb 9.6 oz)       GEN: A&Ox3, NAD, lying in hospital bed, pale  HEENT: MMM, sclerae anicteric. Small abrasion/ecchymosis on R frontal area.   CV: RRR s1 s2 no m/g/r, no peripheral edema  LUNGS: diminished BS at bases, otherwise CTA without rales/wheeze  ABD: soft, NT, ND, no masses  NEURO: alert and interactive, face symmetric, MAEE  LINES: RUE PICC w/dressing intact, c/d, no surrounding erythema

## 2019-01-14 ENCOUNTER — Ambulatory Visit: Payer: Medicare HMO | Admitting: Oncology

## 2019-01-14 ENCOUNTER — Other Ambulatory Visit: Payer: Medicare HMO

## 2019-01-14 NOTE — Unmapped (Signed)
Pt is active comfort care. No distress noted - denies pain. Bed alarm in use - will monitor  Problem: Adult Inpatient Plan of Care  Goal: Plan of Care Review  Outcome: Progressing  Goal: Patient-Specific Goal (Individualization)  Outcome: Progressing  Goal: Absence of Hospital-Acquired Illness or Injury  Outcome: Progressing  Goal: Optimal Comfort and Wellbeing  Outcome: Progressing  Goal: Readiness for Transition of Care  Outcome: Progressing  Goal: Rounds/Family Conference  Outcome: Progressing     Problem: Diabetes Comorbidity  Goal: Blood Glucose Level Within Desired Range  Outcome: Progressing     Problem: Infection (Pneumonia)  Goal: Resolution of Infection Signs/Symptoms  Outcome: Progressing     Problem: Self-Care Deficit  Goal: Improved Ability to Complete Activities of Daily Living  Outcome: Progressing     Problem: Fall Injury Risk  Goal: Absence of Fall and Fall-Related Injury  Outcome: Progressing     Problem: Skin Injury Risk Increased  Goal: Skin Health and Integrity  Outcome: Progressing     Problem: Infection  Goal: Infection Symptom Resolution  Outcome: Progressing     Problem: Adult Inpatient Plan of Care  Goal: Plan of Care Review  Outcome: Progressing  Goal: Patient-Specific Goal (Individualization)  Outcome: Progressing  Goal: Absence of Hospital-Acquired Illness or Injury  Outcome: Progressing  Goal: Optimal Comfort and Wellbeing  Outcome: Progressing  Goal: Readiness for Transition of Care  Outcome: Progressing  Goal: Rounds/Family Conference  Outcome: Progressing     Problem: Diabetes Comorbidity  Goal: Blood Glucose Level Within Desired Range  Outcome: Progressing     Problem: Infection (Pneumonia)  Goal: Resolution of Infection Signs/Symptoms  Outcome: Progressing     Problem: Self-Care Deficit  Goal: Improved Ability to Complete Activities of Daily Living  Outcome: Progressing     Problem: Fall Injury Risk  Goal: Absence of Fall and Fall-Related Injury  Outcome: Progressing     Problem: Skin Injury Risk Increased  Goal: Skin Health and Integrity  Outcome: Progressing     Problem: Infection  Goal: Infection Symptom Resolution  Outcome: Progressing

## 2019-01-14 NOTE — Unmapped (Signed)
Patient AOx2 but varies throughout the day.  Patient on comfort care.  Brother in to visit today.  Patient denied any needs, able to make some needs known and carry on conversations.  No falls or acute events this shift. Safety measures in place of bed low with brakes locked, call bell within reach.  Will CTM.    Problem: Adult Inpatient Plan of Care  Goal: Plan of Care Review  Outcome: Ongoing - Unchanged  Goal: Patient-Specific Goal (Individualization)  Outcome: Ongoing - Unchanged  Goal: Absence of Hospital-Acquired Illness or Injury  Outcome: Ongoing - Unchanged  Goal: Optimal Comfort and Wellbeing  Outcome: Ongoing - Unchanged  Goal: Readiness for Transition of Care  Outcome: Ongoing - Unchanged  Goal: Rounds/Family Conference  Outcome: Ongoing - Unchanged     Problem: Diabetes Comorbidity  Goal: Blood Glucose Level Within Desired Range  Outcome: Ongoing - Unchanged     Problem: Infection (Pneumonia)  Goal: Resolution of Infection Signs/Symptoms  Outcome: Ongoing - Unchanged     Problem: Self-Care Deficit  Goal: Improved Ability to Complete Activities of Daily Living  Outcome: Ongoing - Unchanged     Problem: Fall Injury Risk  Goal: Absence of Fall and Fall-Related Injury  Outcome: Ongoing - Unchanged     Problem: Skin Injury Risk Increased  Goal: Skin Health and Integrity  Outcome: Ongoing - Unchanged     Problem: Infection  Goal: Infection Symptom Resolution  Outcome: Ongoing - Unchanged

## 2019-01-14 NOTE — Unmapped (Signed)
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 Kyle Huffman is a 68 y.o. male with PMHx of acute myeloid leukemia,??failed first line treatment??s/p 2 cycles of Azacitidine + Venetoclax without response and persistent pancytopenia, CKD, hypertension, diabetes mellitus, hyperlipidemia, and recent admission for culture negative endocarditis and remote stroke presenting to [facility] with weakness, pneumonia, acute vs chronic SDH, pancytopenia with neutropenia.    Comfort focused care He has decided to pursue hospice directed care given his AML has not responded to treatment and now is complicated by transfusion dependent pancytopenia and subdural hematoma.   - Stop lab checks  - Can discuss transfusion for comfort but denying dyspnea  - Oxycodone for pain (currently only reporting leg pain)  - Lorazepam for anxiety  - Haloperidol for agitation  - Appreciate CM help with hospice discharge planning / family and patient interested in residential or inpatient hospice depending on what he qualifies for  ??  Code Status: DNR/DNI  ??  POA: Brother. Thomas Luna. [phone]  ___________________________________________________________________    Subjective:  He reports feeling well today, no complaints. Later heard from his nurse that he has been having leg pain. Updated brother at bedside.    Labs/Studies:  Labs and Studies from the last 24hrs per EMR and Reviewed    Objective:   ,     Intake/Output Summary (Last 24 hours) at 01/14/2019 1116  Last data filed at 01/14/2019 0900  Gross per 24 hour   Intake 50 ml   Output ???   Net 50 ml   ,   Wt Readings from Last 3 Encounters:   01/02/19 61 kg (134 lb 9.5 oz)   01/02/19 60.4 kg (133 lb 3.2 oz)   12/27/18 63.3 kg (139 lb 9.6 oz)       GEN: A&Ox3, NAD, lying in hospital bed, pale  HEENT: MMM, sclerae anicteric. Bandage on right forehead.   CV: RRR  LUNGS: Normal work of breathing  ABD: soft, NT, ND  NEURO: alert and interactive, face symmetric, MAEE  LINES: RUE PICC w/dressing intact, c/d, no surrounding erythema

## 2019-01-15 LAB — CULTURE, BLOOD (ROUTINE X 2)
Culture: NO GROWTH
Culture: NO GROWTH
Special Requests: ADEQUATE
Special Requests: ADEQUATE

## 2019-01-15 MED ORDER — OXYCODONE 5 MG TABLET
ORAL_TABLET | ORAL | 0 refills | 0 days | PRN
Start: 2019-01-15 — End: 2019-01-20

## 2019-01-15 MED ORDER — MELATONIN 3 MG TABLET
Freq: Every evening | ORAL | 0 refills | 0.00000 days | PRN
Start: 2019-01-15 — End: ?

## 2019-01-15 MED ORDER — ONDANSETRON 8 MG DISINTEGRATING TABLET
Freq: Three times a day (TID) | ORAL | 0 refills | 0.00000 days | PRN
Start: 2019-01-15 — End: 2019-01-22

## 2019-01-15 MED ORDER — GUAIFENESIN 100 MG/5 ML ORAL LIQUID
ORAL | 0 refills | 0.00000 days | PRN
Start: 2019-01-15 — End: ?

## 2019-01-15 MED ORDER — ALBUTEROL SULFATE 2.5 MG/3 ML (0.083 %) SOLUTION FOR NEBULIZATION
RESPIRATORY_TRACT | 0 refills | 0 days | PRN
Start: 2019-01-15 — End: 2020-01-15

## 2019-01-15 MED ORDER — LOPERAMIDE 2 MG CAPSULE
ORAL_CAPSULE | Freq: Three times a day (TID) | ORAL | 0 refills | 0.00000 days | PRN
Start: 2019-01-15 — End: 2019-02-14

## 2019-01-15 MED ORDER — LORAZEPAM 0.5 MG TABLET
ORAL_TABLET | ORAL | 0 refills | 0.00000 days | PRN
Start: 2019-01-15 — End: 2019-01-20

## 2019-01-15 MED ORDER — HALOPERIDOL 0.5 MG TABLET
Freq: Four times a day (QID) | ORAL | 0 refills | 0 days | PRN
Start: 2019-01-15 — End: 2019-02-14

## 2019-01-15 MED ORDER — OXYCODONE 10 MG TABLET
ORAL_TABLET | ORAL | 0 refills | 0 days | PRN
Start: 2019-01-15 — End: 2019-01-20

## 2019-01-15 NOTE — Unmapped (Signed)
Physician Discharge Summary [facility]  4 ONC [facility]  [address]  [place] [address]  Dept: [phone]  Loc: [phone]     Identifying Information:   Kyle Huffman  [birth year]  [id number]    Primary Care Physician: Lauro Regulus   Code Status: DNR and DNI    Admit Date: 01/10/2019    Discharge Date: 01/15/2019     Discharge To: Skilled nursing facility    Discharge Service: MDL - Hospitalist (Med L)     Discharge Attending Physician: Romie Levee, MD    Discharge Diagnoses:  Principal Problem:    Bilateral pneumonia POA: Unknown  Active Problems:    Chronic kidney disease (CKD), stage III (moderate) (CMS-[name]) POA: Yes    Hyperlipidemia associated with type 2 diabetes mellitus (CMS-[name]) POA: Yes    Type 2 diabetes mellitus with diabetic polyneuropathy, with long-term current use of insulin (CMS-[name]) POA: Not Applicable    Hypertension POA: Yes    Blastic plasmacytoid dendritic cell neoplasm (BPDCN) (CMS-[name]) POA: Yes    Weakness of both lower extremities POA: Yes    Acute myeloid leukemia not having achieved remission (CMS-[name]) POA: Yes    Chronic subdural hematoma (CMS-[name]) POA: Unknown  Resolved Problems:    * No resolved hospital problems. *      Hospital Course:     Kyle Huffman??is a 68 y.o.??male??with past medical history significant for acute myeloid leukemia,??failed first line treatment??s/p 2 cycles of Azacitidine + Venetoclax without response, CKD, hypertension, diabetes mellitus, hyperlipidemia,??and recent admission for culture negative endocarditis??and remote??stroke presenting??to [facility]??with weakness, pneumonia, acute vs chronic SDH, pancytopenia with neutropenia. He decided to pursue hospice directed care given his AML has not responded to treatment and now is complicated by transfusion dependent pancytopenia and subdural hematoma. He was therefore discharged to inpatient hospice.     This admission there as concern for possible pneumonia and the patient was found to have subdural hematoma with acute cortical contusion.  He was initiated on antibiotics for possible PNA but this was stopped when elected for comfort measures only.  He was seen by neurosurgery for SDH seen on OSH imaging; the SDH was felt to be chronic and not clinically significant.       Procedures:     No admission procedures for hospital encounter.  ______________________________________________________________________  Discharge Medications:     Your Medication List      STOP taking these medications    amLODIPine 10 MG tablet  Commonly known as:  NORVASC     celecoxib 200 MG capsule  Commonly known as:  CeleBREX     insulin glargine 100 unit/mL injection  Commonly known as:  LANTUS     insulin lispro 100 unit/mL injection  Commonly known as:  HumaLOG     levoFLOXacin 250 MG tablet  Commonly known as:  LEVAQUIN     micafungin 50 mg, OVERFILL 10 mL in sodium chloride 0.9 % 100 mL IVPB     mirtazapine 7.5 MG tablet  Commonly known as:  REMERON     OLANZapine zydis 5 MG disintegrating tablet  Commonly known as:  ZyPREXA     ONETOUCH VERIO Strp  Generic drug:  blood sugar diagnostic     posaconazole 100 mg Tbec delayed released tablet  Commonly known as:  NOXAFIL     pravastatin 10 MG tablet  Commonly known as:  PRAVACHOL     sevelamer 800 mg tablet  Commonly known as:  RENVELA  traMADol 50 mg tablet  Commonly known as:  ULTRAM     valACYclovir 500 MG tablet  Commonly known as:  VALTREX        START taking these medications    albuterol 2.5 mg /3 mL (0.083 %) nebulizer solution  Inhale 3 mL (2.5 mg total) by nebulization every four (4) hours as needed for wheezing or shortness of breath.     guaiFENesin 100 mg/5 mL syrup  Commonly known as:  ROBITUSSIN  Take 10 mL (200 mg total) by mouth every four (4) hours as needed for cough.     haloperidol 0.5 MG tablet  Commonly known as:  HALDOL  Take 1 tablet (0.5 mg total) by mouth every six (6) hours as needed (agitated delirium).     loperamide 2 mg capsule  Commonly known as: IMODIUM  Take 1 capsule (2 mg total) by mouth Three (3) times a day as needed.     LORazepam 0.5 MG tablet  Commonly known as:  ATIVAN  Take 1 tablet (0.5 mg total) by mouth every four (4) hours as needed for anxiety for up to 5 days.     melatonin 3 mg Tab  Take 1 tablet (3 mg total) by mouth nightly as needed.     oxyCODONE 5 MG immediate release tablet  Commonly known as:  ROXICODONE  Take 1 tablet (5 mg total) by mouth every four (4) hours as needed for up to 5 days.     oxyCODONE 10 mg immediate release tablet  Commonly known as:  ROXICODONE  Take 1 tablet (10 mg total) by mouth every four (4) hours as needed for up to 5 days.        CHANGE how you take these medications    ondansetron 8 MG disintegrating tablet  Commonly known as:  ZOFRAN-ODT  Take 1 tablet (8 mg total) by mouth every eight (8) hours as needed for up to 7 days.  What changed:    ?? medication strength  ?? how much to take  ?? reasons to take this        CONTINUE taking these medications    acetaminophen 325 MG tablet  Commonly known as:  TYLENOL  Take 650 mg by mouth every four (4) hours as needed for pain.     gabapentin 100 MG capsule  Commonly known as:  NEURONTIN  Take 100 mg by mouth nightly.            Allergies:  Patient has no known allergies.  ______________________________________________________________________  Pending Test Results (if blank, then none):  Pending Labs     Order Current Status    Blood Culture #1 Preliminary result    Blood Culture #2 Preliminary result          Most Recent Labs:  All lab results last 24 hours - No results found for this or any previous visit (from the past 24 hour(s)).    Relevant Studies/Radiology (if blank, then none):  Echocardiogram W Colorflow Spectral Doppler    Result Date: 01/12/2019  ?? Left ventricular hypertrophy - mild ?? Normal left ventricular systolic function, ejection fraction > 55% ?? Degenerative mitral valve disease ?? Mitral regurgitation - mild ?? Aortic sclerosis ?? 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 Correlation with PET/CT from 10/30/2018, chest radiograph from 11/04/2018. TECHNIQUE: Portable AP chest radiograph presented for interpretation 01/10/2019 7:18 PM.  The study is dated 01/10/2019, was obtained at [facility] and is  interpreted at the request of Dr. Gilman Schmidt . FINDINGS: There is a right-sided PICC line with tip projecting over the cavoatrial junction. Hazy densities at the left greater than right lung bases, morphology better demonstrated on same-day chest CT. No pneumothorax. Thoracic aorta is calcified at the aortic arch. Prominent cardiac silhouette is probably exaggerated by technique. There are diffuse osseous sclerotic foci, also seen on 10/30/2018 PET/CT. CONCLUSIONS: Hazy densities in the left greater than right lung bases.    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 Forest Gleason to interpret a CT of the abdomen and pelvis performed at [facility] on 01/10/2019. Images consist of noncontrast enhanced axial images. Images were loaded into the [facility] PACS. FINDINGS: LOWER THORAX: See same day CT of the chest for findings above the level of the diaphragm HEPATOBILIARY: Unremarkable noncontrast enhanced appearance of the liver. The gallbladder is present and otherwise unremarkable. No biliary dilatation.  SPLEEN: Prominent splenic size measuring up to approximately 12.7 cm in craniocaudal dimension. PANCREAS: Unremarkable. ADRENALS: Bilateral adrenal thickening. KIDNEYS/URETERS: No hydronephrosis or nephrolithiasis. The kidneys demonstrate ill-defined borders and bilateral renal renal collecting systems are obscured. BLADDER: Unremarkable. PELVIC ORGANS: Unremarkable. GI TRACT: No dilated or thick walled loops of bowel. The appendix is unremarkable. PERITONEUM/RETROPERITONEUM AND MESENTERY: No free air or fluid. LYMPH NODES: Interval increase in size of retroperitoneal adenopathy including a 1.2 cm left periaortic node (axial 77) previously approximately 0.8 cm. Aortocaval node measuring up to 1.1 cm (axial 86) previously 0.8 cm. Additional newly enlarged gastrohepatic nodes measuring up to 1.0 cm (axial 58). VESSELS: Atherosclerotic disease of the abdominal aorta and branch vessels. BONES AND SOFT TISSUES: Diffuse osseous sclerotic disease.     - Interval increase in the size of  enlarged retroperitoneal and gastrohepatic ligament lymph nodes consistent. - Diffuse osseous sclerotic lesions involving the ribs, spine and pelvic bones and bilateral femurs. This could be secondary to patient's MDS although metastatic disease is also in the differential. -The kidneys demonstrate ill-defined borders and bilateral renal renal collecting systems are obscured. The lesion is limited due to technique; however, this could be secondary to infiltrative disease such as leukemic infiltration. PLEASE NOTE:  Our interpretation of studies performed at an outside institution is limited by factors including the absence of technical specifics of the image, undisclosed clinical information and the unavailability of the original interpretation.  Specialists at the institution that performed the study may have access to information not available to [place] that could make a difference in this interpretation.  We suggest that you obtain the original interpretation from the site where the study was performed.    Ct Body Interpretation Of Outside Film (  Corky Sox)    Result Date: 01/11/2019  EXAM: OUTSIDE CT Chest without contrast. 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 Serial axial CT images of the chest from the thoracic inlet through the hemidiaphragms were obtained without IV contrast. FINDINGS: Right PICC terminates in the upper cavoatrial junction. Debris in the left lower lobe bronchus. Left lower lobe opacity with volume loss. Patchy pulmonary parenchymal opacities in the right upper and lower lobes. Small left and trace right pleural effusions. Heart size normal. Small pericardial effusion. Coronary and aortic atherosclerosis. Normal caliber thoracic aorta. 1 cm subcarinal and right cardiophrenic lymph nodes. Diffuse sclerosis throughout the axial and appendicular skeleton, likely underlying hematologic malignancy. Please see concurrent CT abdomen/pelvis report for findings below the diaphragm.     Left lower lobe opacity with volume loss, most likely atelectasis. Debris within the left lower lobe bronchus. Scattered patchy pulmonary parenchymal opacities likely infectious. Subcarinal and right cardiophrenic adenopathy likely reactive. Small left and trace right pleural effusions. Small pericardial effusion. PLEASE NOTE: Our interpretation of studies performed at an outside institution is limited by factors including the absence of technical specifics of the image, undisclosed clinical information and the unavailability of the original interpretation.  Specialists at the institution that performed the study may have access to information not available to [place] that could make a difference in this interpretation.  We suggest that you obtain the original interpretation from the site where the study was performed.    Ct Neuro Interpretation Of Outside Film Corky Sox)    Result Date: 01/11/2019  Cervical spine CT presented for interpretation 01/11/2019 8:12 AM.  The study is dated 01/10/2019, was obtained at [facility] and is interpreted at the request of Dr. Gilman Schmidt. EXAM: Computed tomography, cervical spine without contrast material. 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 TECHNIQUE: Axial CT images through the cervical spine without contrast. Coronal and sagittal reformatted images, bone and soft tissue algorithm are provided. FINDINGS: No acute fracture. Heterogeneous sclerosis throughout the cervical spine is unchanged from PET/CT 10/30/2018. Multilevel uncovertebral spurring, worse on the left at C6-C7 with severe left neural foraminal narrowing. Facet arthropathy, worst on the right at C3-C4 with severe right neural foraminal narrowing. Multilevel marginal endplate osteophyte formation, greatest at C6-C7 with at least mild osseous spinal canal narrowing. Calcified plaque of the carotid bifurcations bilaterally.     No acute fracture or spondylolisthesis. Unchanged heterogeneous sclerosis throughout the spine compared to recent PET/CT. Degenerative cervical spondylosis as above.    Ct Neuro Interpretation Of Outside Film Corky Sox)    Result Date: 01/11/2019  CT brain presented for interpretation 01/11/2019 8:21 AM.  The study is dated 01/10/2019, was obtained at [facility] and is interpreted at the request of Dr. Gilman Schmidt. EXAM: Computed tomography, head or brain without contrast material. 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 TECHNIQUE: Axial CT images of the head  from skull base to vertex without contrast. FINDINGS: There is no midline shift.  There is no evidence of acute infarct. Mild diffuse parenchymal volume loss, with advanced volume loss in the cerebellum. New small left subdural collection with mild hyperdensity relative to CSF measuring 5 mm in thickness along the left convexity. There is a small area of hypodensity in the posterior left frontal lobe near the vertex, likely representing trace subarachnoid blood products. No fractures are evident. Mild diffuse paranasal sinus mucosal thickening, with sparing of the left maxillary sinus.     New acute subacute left convexity subdural hematoma without significant midline shift. There are also trace subarachnoid blood products in the posterior left frontal sulcus near the vertex. Mild diffuse parenchymal volume loss, with advanced volume loss in the cerebellum.    ______________________________________________________________________  Discharge Instructions:   Activity Instructions     Activity as tolerated            Diet Instructions     Discharge diet (specify)      Discharge Nutrition Therapy:  General                    ______________________________________________________________________  Discharge Day Services:  BP 131/61  - Pulse 104  - Temp 36.4 ??C (97.5 ??F) (Oral)  - Resp 20  - SpO2 97%   Pt seen on the day of discharge and determined appropriate for discharge.    Condition at Discharge: stable    Length of Discharge: I spent greater than 30 mins in the discharge of this patient.

## 2019-01-15 NOTE — Unmapped (Signed)
Patient continues on comfort measures. No acute changes in status this shift. Given oxy for leg pain with good response. Brother (HCDM) at bedside. Will CTM.     Problem: Adult Inpatient Plan of Care  Goal: Plan of Care Review  Outcome: Ongoing - Unchanged  Goal: Patient-Specific Goal (Individualization)  Outcome: Ongoing - Unchanged  Goal: Absence of Hospital-Acquired Illness or Injury  Outcome: Ongoing - Unchanged  Goal: Optimal Comfort and Wellbeing  Outcome: Ongoing - Unchanged  Goal: Readiness for Transition of Care  Outcome: Ongoing - Unchanged  Goal: Rounds/Family Conference  Outcome: Ongoing - Unchanged

## 2019-01-15 NOTE — Unmapped (Signed)
Pt remains on comfort care measures only. Pt refused all turns this shift. Pt given PRN pain medication x1 with good effect. Pts linen and gown changed d/t incontinence. Although confused and drowsy most of shift, pt oriented x3 and only disoriented to time. Pt resting comfortably with no complaints/concerns. Will continue to monitor.   Problem: Adult Inpatient Plan of Care  Goal: Plan of Care Review  Outcome: Ongoing - Unchanged  Goal: Patient-Specific Goal (Individualization)  Outcome: Ongoing - Unchanged  Goal: Absence of Hospital-Acquired Illness or Injury  Outcome: Ongoing - Unchanged  Goal: Optimal Comfort and Wellbeing  Outcome: Ongoing - Unchanged  Goal: Readiness for Transition of Care  Outcome: Ongoing - Unchanged  Goal: Rounds/Family Conference  Outcome: Ongoing - Unchanged

## 2019-01-16 NOTE — Unmapped (Signed)
Patient maintained on comfort care. Disoriented much of shift. Given prn oxy x2 and morphine x1 for pain in legs. Incontinent of urine. Brother at bedside this morning; updated on POC. Now en route to [facility] w/ BLS transport. Left call back number for report.     Problem: Adult Inpatient Plan of Care  Goal: Plan of Care Review  Outcome: Transitioned to Another Facility  Goal: Patient-Specific Goal (Individualization)  Outcome: Transitioned to Another Facility  Goal: Absence of Hospital-Acquired Illness or Injury  Outcome: Transitioned to Another Facility  Goal: Optimal Comfort and Wellbeing  Outcome: Transitioned to Another Facility  Goal: Readiness for Transition of Care  Outcome: Transitioned to Another Facility  Goal: Rounds/Family Conference  Outcome: Transitioned to Another Facility

## 2019-02-03 DEATH — deceased

## 2019-04-30 IMAGING — CR DG CHEST 2V
1 series · 2 of 2 positions shown · non-contrast
Comparison: 10/09/2018.

CLINICAL DATA: Fever.

EXAM:
CHEST - 2 VIEW

[Series 1: dg chest 2 view · 0.14mm/px · 2 of 2 slices shown]
[im 1/2]
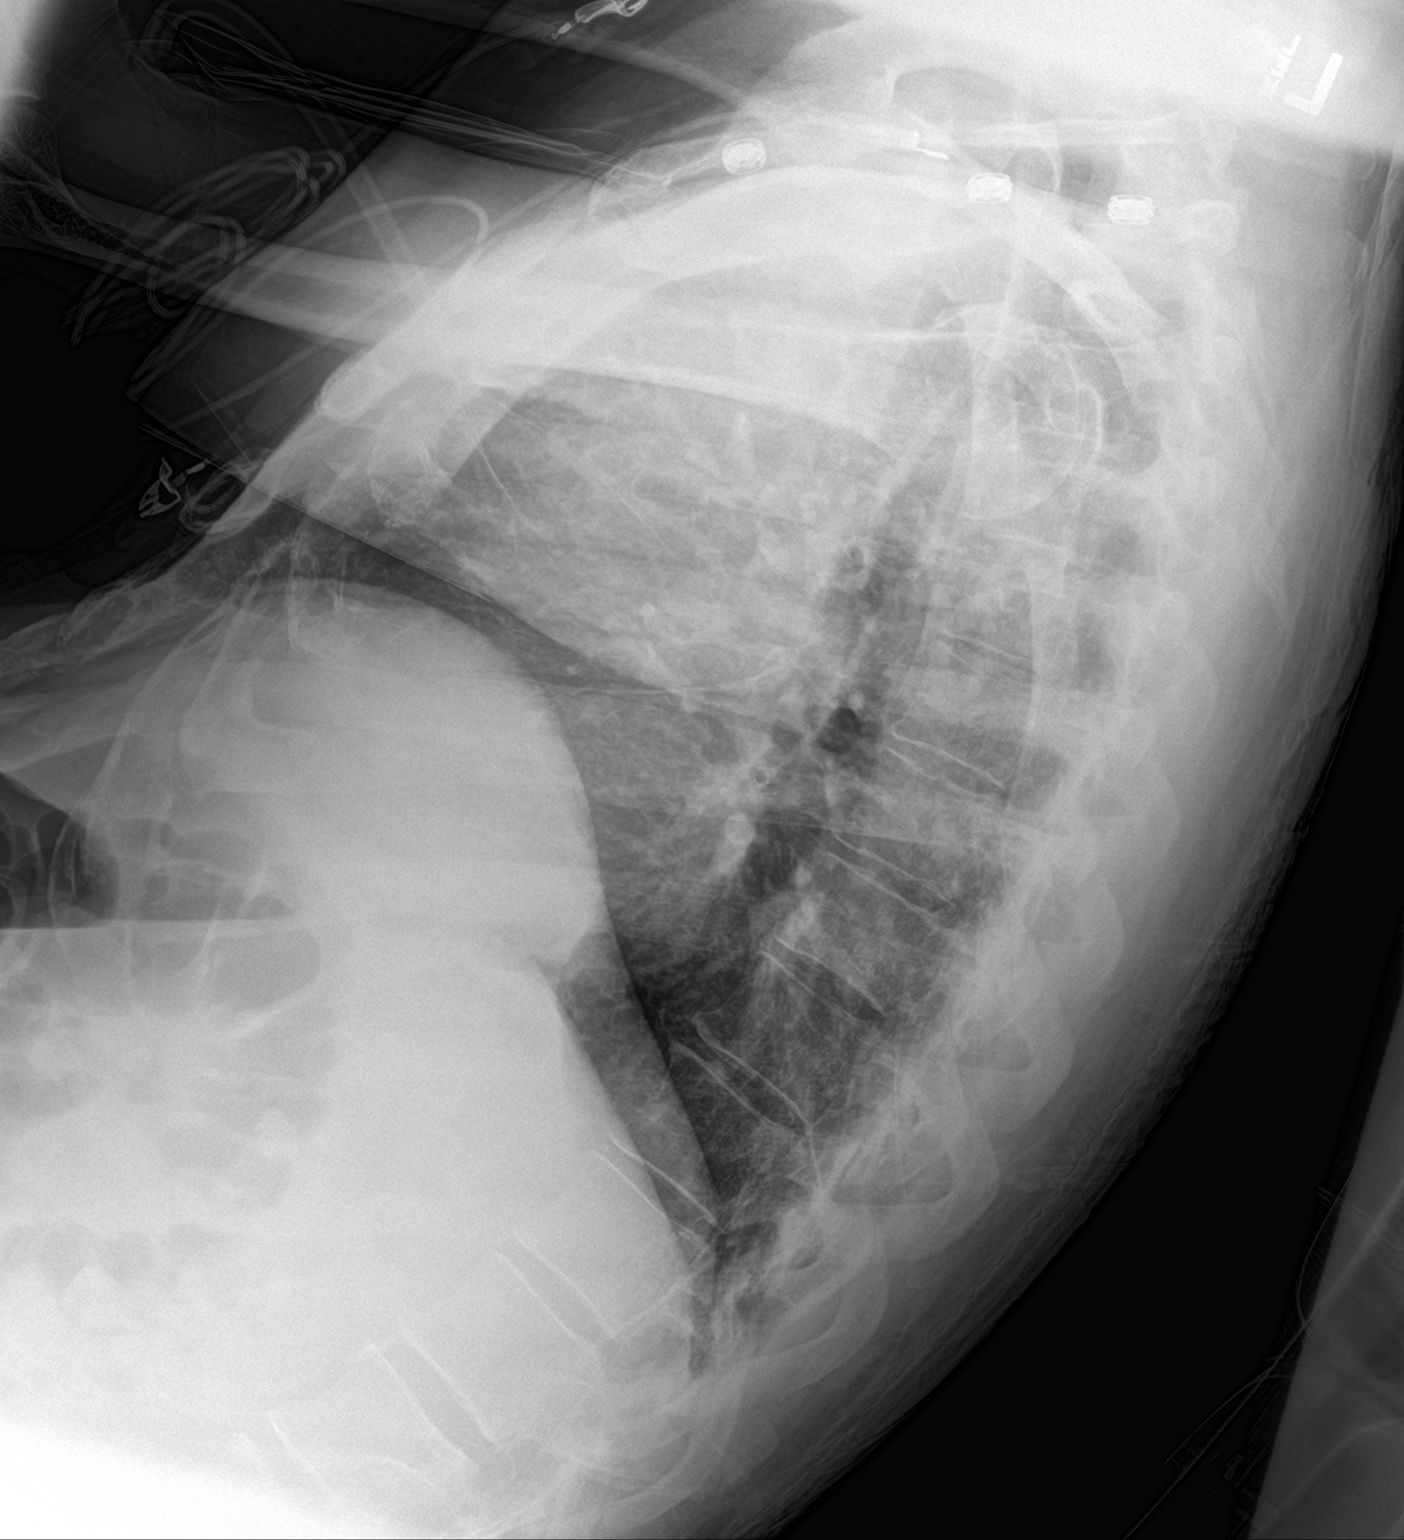
[im 2/2]
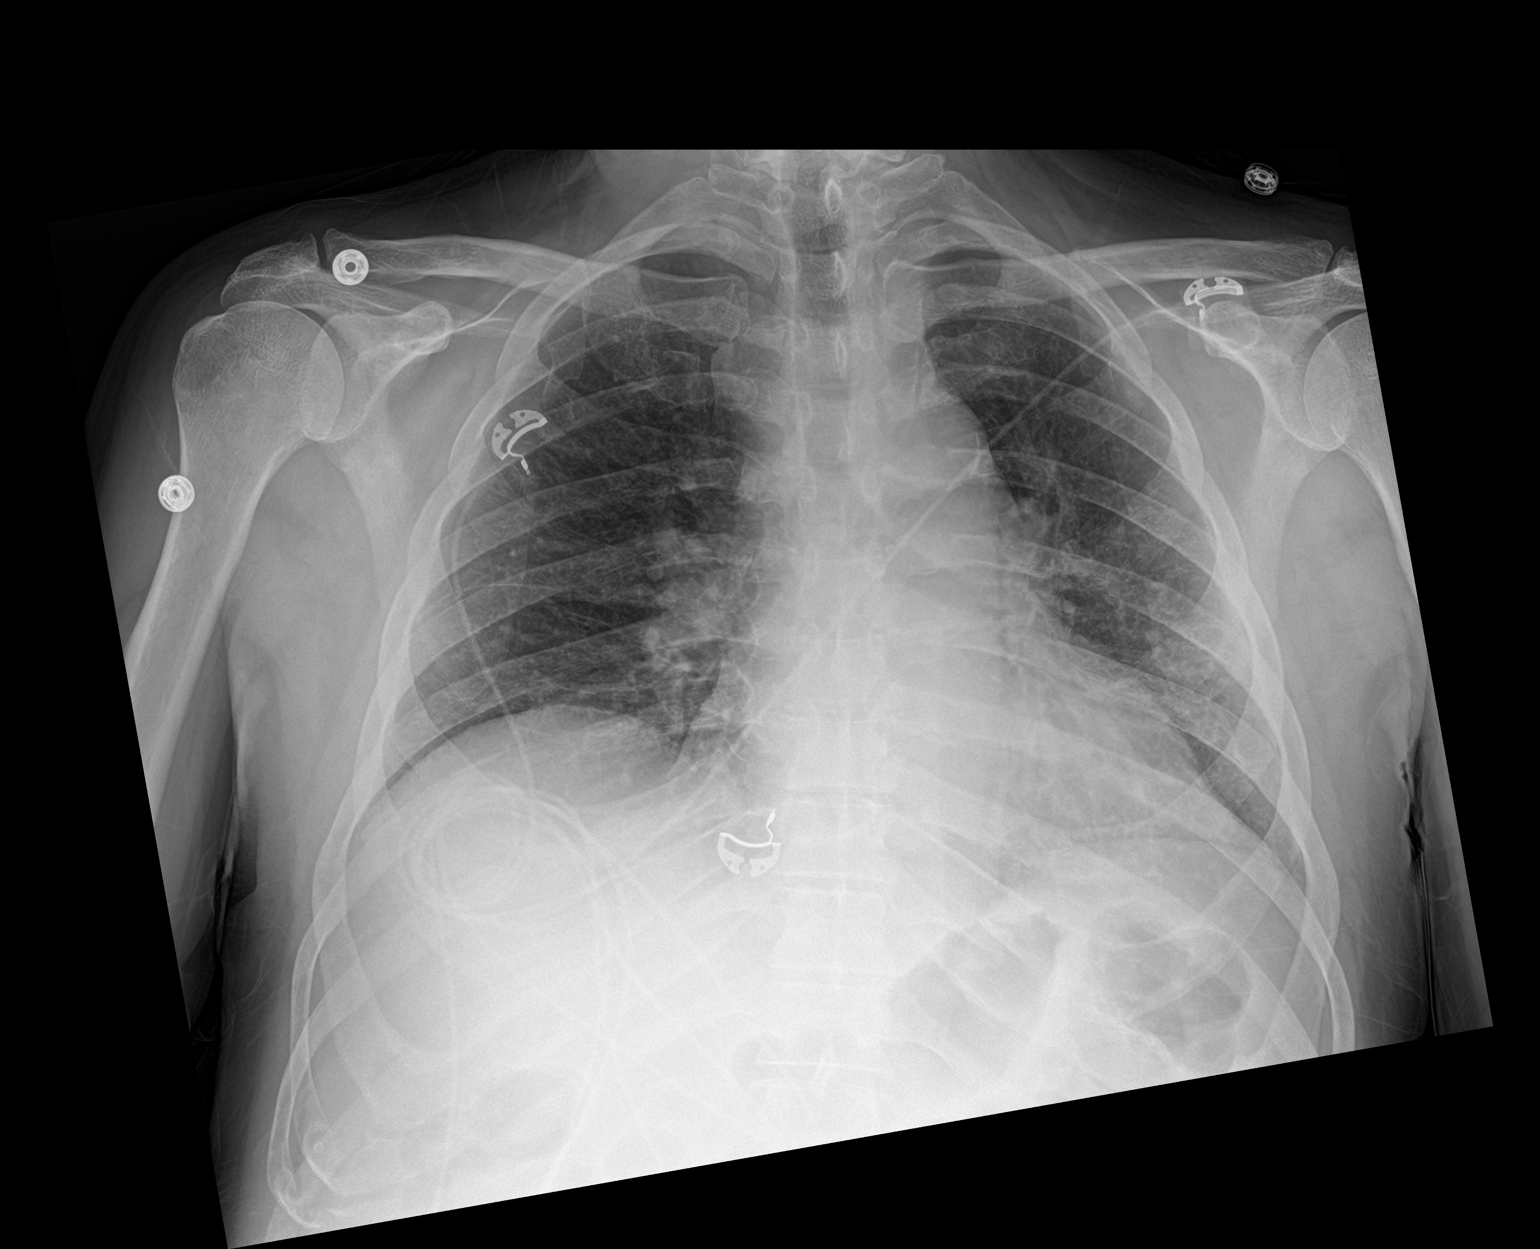

[2 of 2 positions shown; findings below may reference images not displayed]

FINDINGS: Mediastinum and hilar structures normal. Heart size stable. Mild
bibasilar atelectasis. Mild left base infiltrate. No pleural
effusion or pneumothorax.
IMPRESSION: Mild bibasilar atelectasis.  Mild left base infiltrate.

## 2019-04-30 IMAGING — CT CT ABD-PELV W/ CM
2 of 5 series · 14 of 46 positions shown, 16 images · IV contrast (APPLIED)
Comparison: None.

CLINICAL DATA: Sepsis.  Fever of unknown origin.

EXAM:
CT ABDOMEN AND PELVIS WITH CONTRAST
TECHNIQUE: Multidetector CT imaging of the abdomen and pelvis was performed
using the standard protocol following bolus administration of
intravenous contrast.
CONTRAST:  100mL T2NRKP-ADD IOPAMIDOL (T2NRKP-ADD) INJECTION 61%

[Series 2: routine abd/pel with · axial · 0.78mm/px · z∈[-861,-366]mm · 11 of 111 slices shown, 13 images]
[im 6/111  soft-tissue]
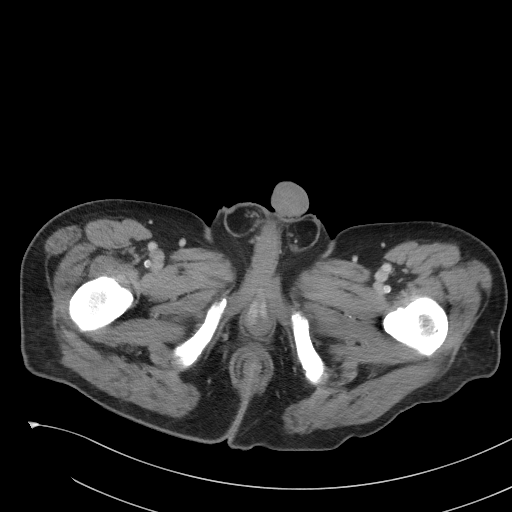
[im 6/111  bone]
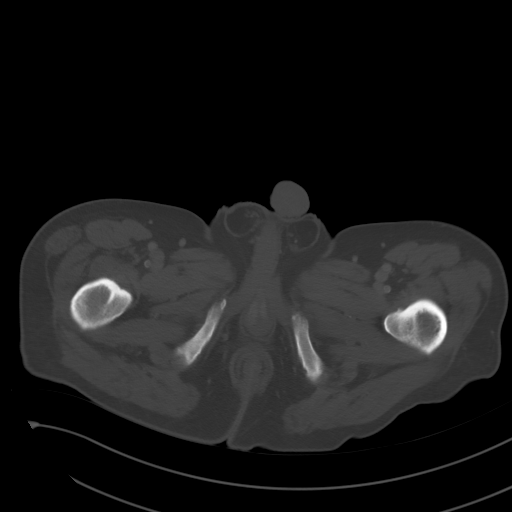
[im 18/111  soft-tissue]
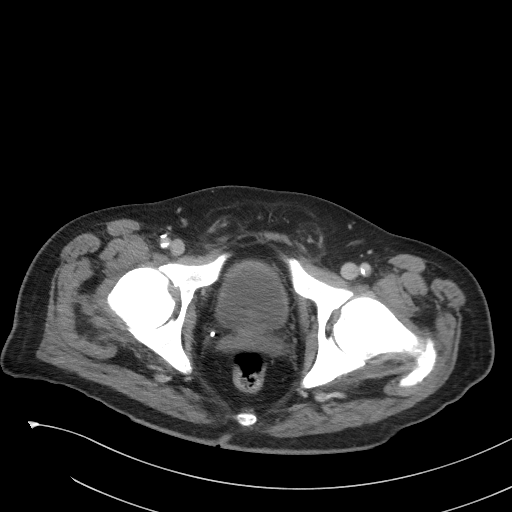
[im 29/111  soft-tissue]
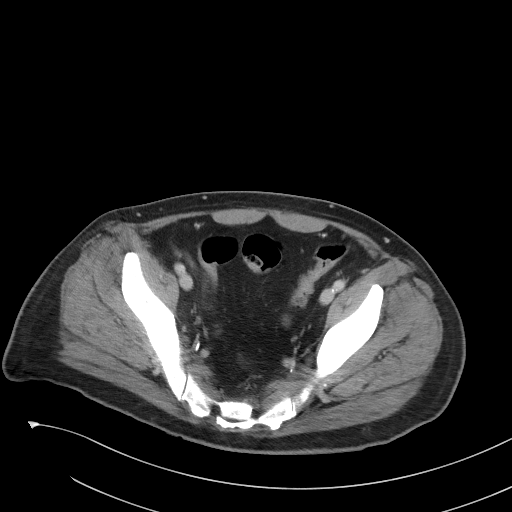
[im 35/111  soft-tissue]
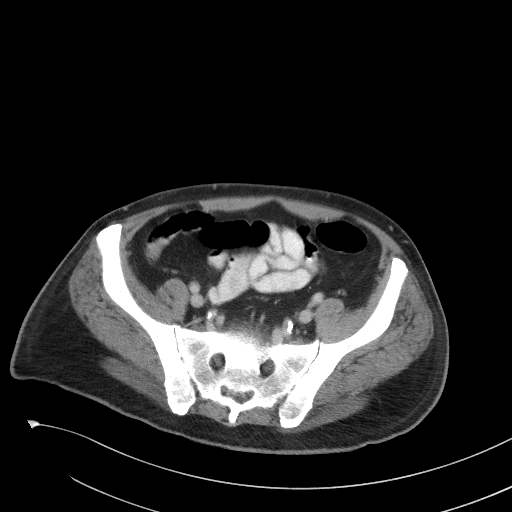
[im 47/111  soft-tissue]
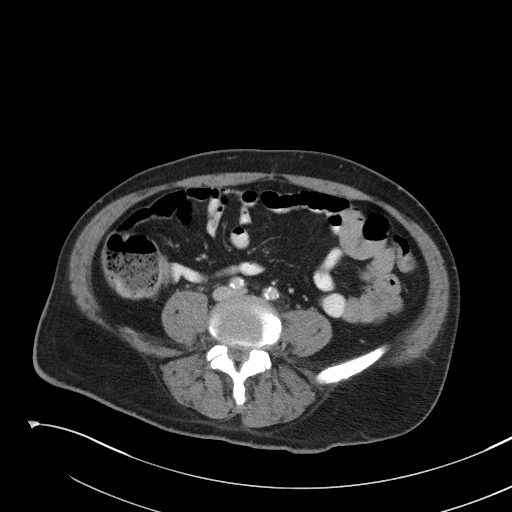
[im 58/111  soft-tissue]
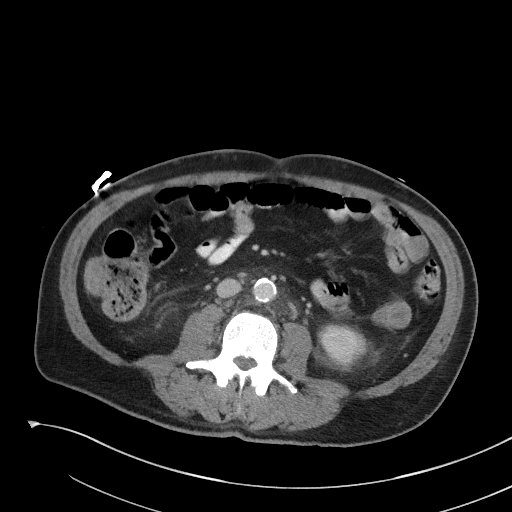
[im 64/111  soft-tissue]
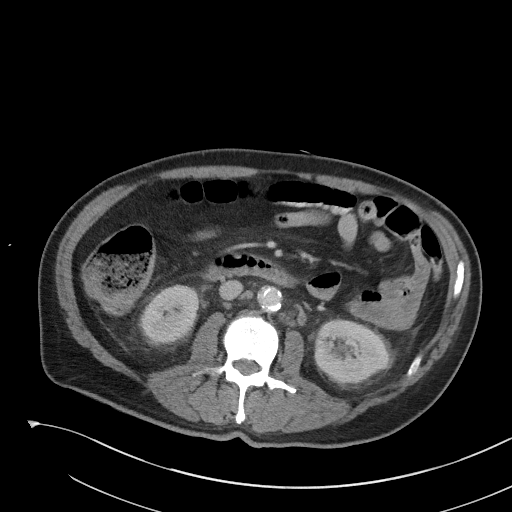
[im 76/111  soft-tissue]
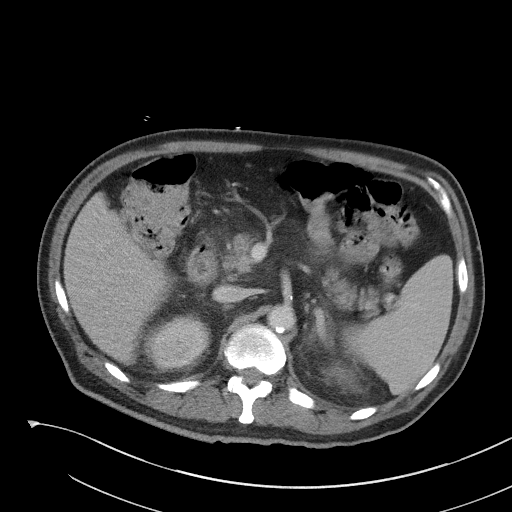
[im 82/111  soft-tissue]
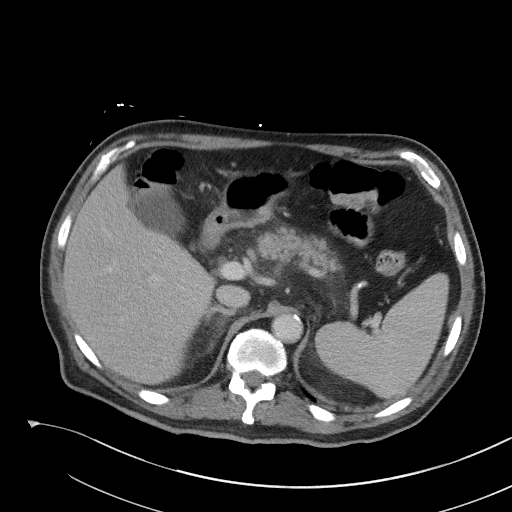
[im 82/111  bone]
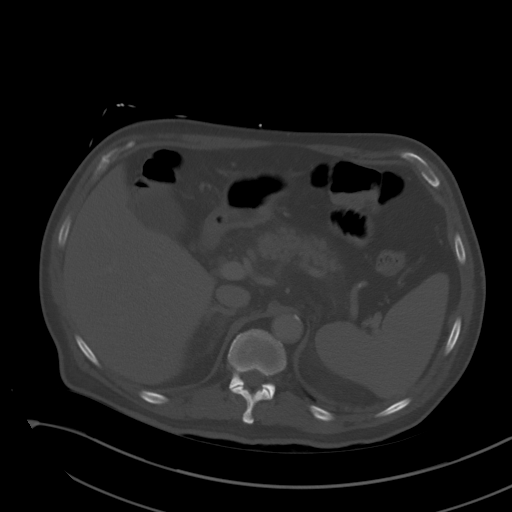
[im 93/111  soft-tissue]
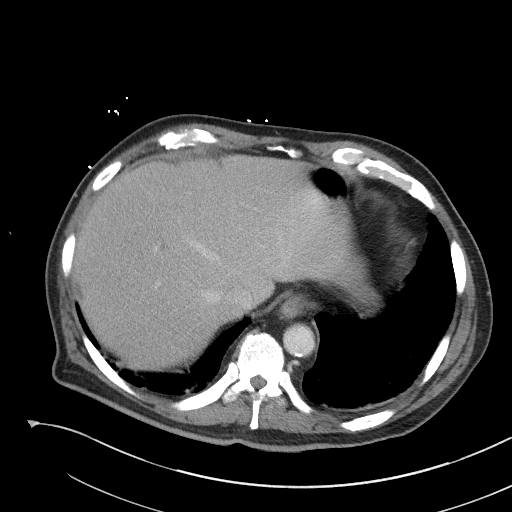
[im 105/111  soft-tissue]
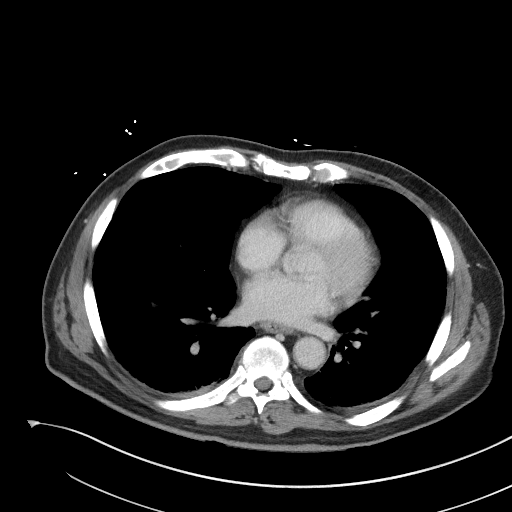

[Series 5: coronal st · coronal · 0.79mm/px · 3 of 86 slices shown]
[im 29/86  soft-tissue]
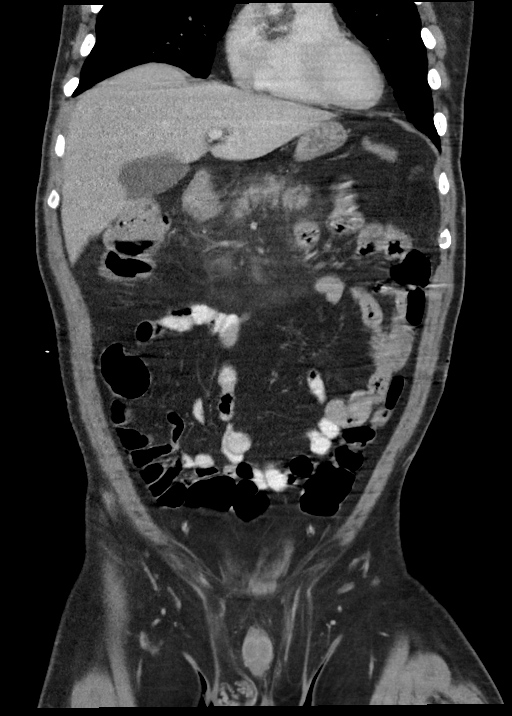
[im 38/86  soft-tissue]
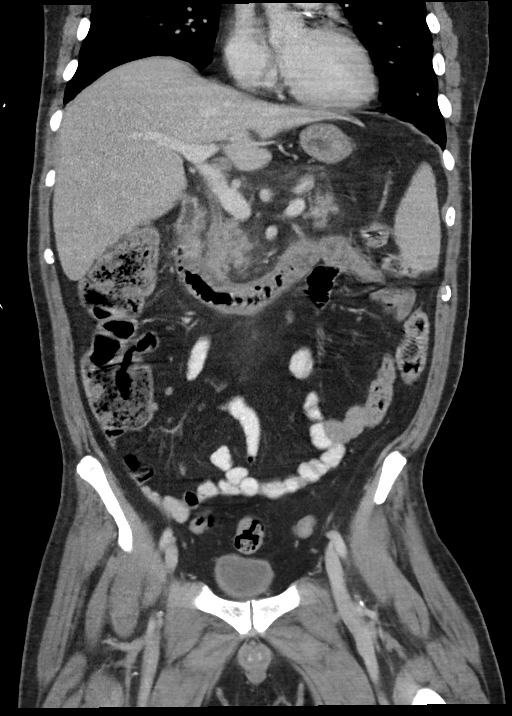
[im 48/86  soft-tissue]
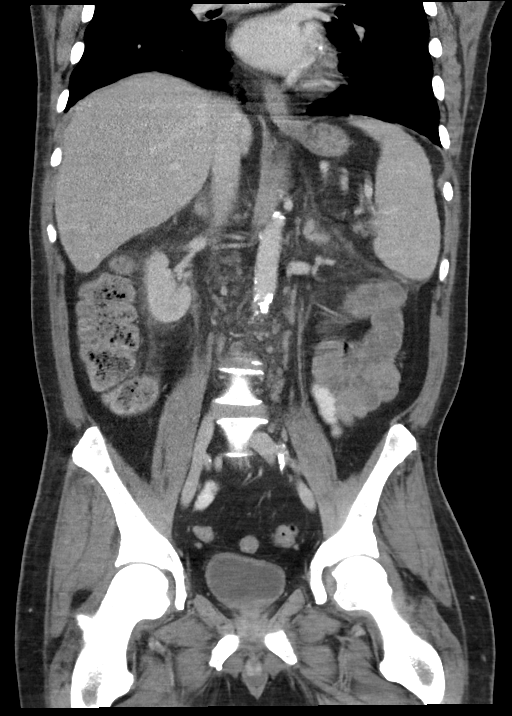

[14 of 46 positions shown; findings below may reference images not displayed]

FINDINGS: Lower chest: Coronary artery calcification is evident. 9 mm short
axis paraesophageal lymph node identified image 3/series 2. 4 mm
right lower lobe pulmonary nodule identified ([DATE]).

Hepatobiliary: No focal abnormality within the liver parenchyma.
There is no evidence for gallstones, gallbladder wall thickening, or
pericholecystic fluid. No intrahepatic or extrahepatic biliary
dilation.

Pancreas: Subtle peripancreatic edema evident with a suggestion of
edema within the head of pancreas. No dilatation of the main
pancreatic duct.

Spleen: No splenomegaly. No focal mass lesion.

Adrenals/Urinary Tract: No adrenal nodule or mass although there is
surrounding stranding of both adrenal glands. Perinephric edema
noted bilaterally without hydronephrosis or focal renal mass lesion.
No evidence for hydroureter. The urinary bladder appears normal for
the degree of distention.

Stomach/Bowel: Stomach is nondistended. No gastric wall thickening.
No evidence of outlet obstruction. Duodenum is normally positioned
as is the ligament of Treitz. No small bowel wall thickening. No
small bowel dilatation. The terminal ileum is normal. The appendix
is normal. No gross colonic mass. No colonic wall thickening. No
substantial diverticular change.

Vascular/Lymphatic: There is abdominal aortic atherosclerosis
without aneurysm. Portal vein and superior mesenteric vein are
patent. Splenic vein is patent. There is no gastrohepatic or
hepatoduodenal ligament lymphadenopathy. No intraperitoneal or
retroperitoneal lymphadenopathy.. Upper normal portal caval lymph
node evident. Small para-aortic lymph nodes are associated. No
pelvic sidewall lymphadenopathy.

Reproductive: The prostate gland and seminal vesicles have normal
imaging features.

Other: No intraperitoneal free fluid.

Musculoskeletal: Small bilateral groin hernias contain only fat.
Tiny gas bubbles in the subcutaneous fat of the right paramidline
anterior abdominal wall likely related to an injection site. No
worrisome lytic or sclerotic osseous abnormality. Heterogeneous bony
mineralization likely related to the patient's known chronic kidney
disease.
IMPRESSION: 1. Soft tissue stranding is identified in the retroperitoneal space
of the abdomen, diffusely around the pancreas, adrenal glands, and
kidneys. These changes are fairly evenly distributed without a
definite epicenter making the appearance indeterminate. Changes
around the pancreas could be related to pancreatitis and the
perirenal edema could reflect pyelonephritis. Bilateral symmetric
periadrenal edema/stranding is of uncertain significance.
2. No evidence for discrete, focal abscess in the abdomen/pelvis.
3. 4 mm right lower lobe pulmonary nodule. No follow-up needed if
patient is low-risk. Non-contrast chest CT can be considered in 12
months if patient is high-risk. This recommendation follows the
consensus statement: Guidelines for Management of Incidental
Pulmonary Nodules Detected on CT Images: From the [HOSPITAL]
4.  Aortic Atherosclerois (HEUBT-170.0)

## 2019-07-31 IMAGING — DX DG CHEST 1V
1 series · 1 of 1 positions shown · non-contrast
Comparison: Chest x-ray 10/16/2018.

CLINICAL DATA: Weakness.

EXAM:
CHEST  1 VIEW

[chest ap]
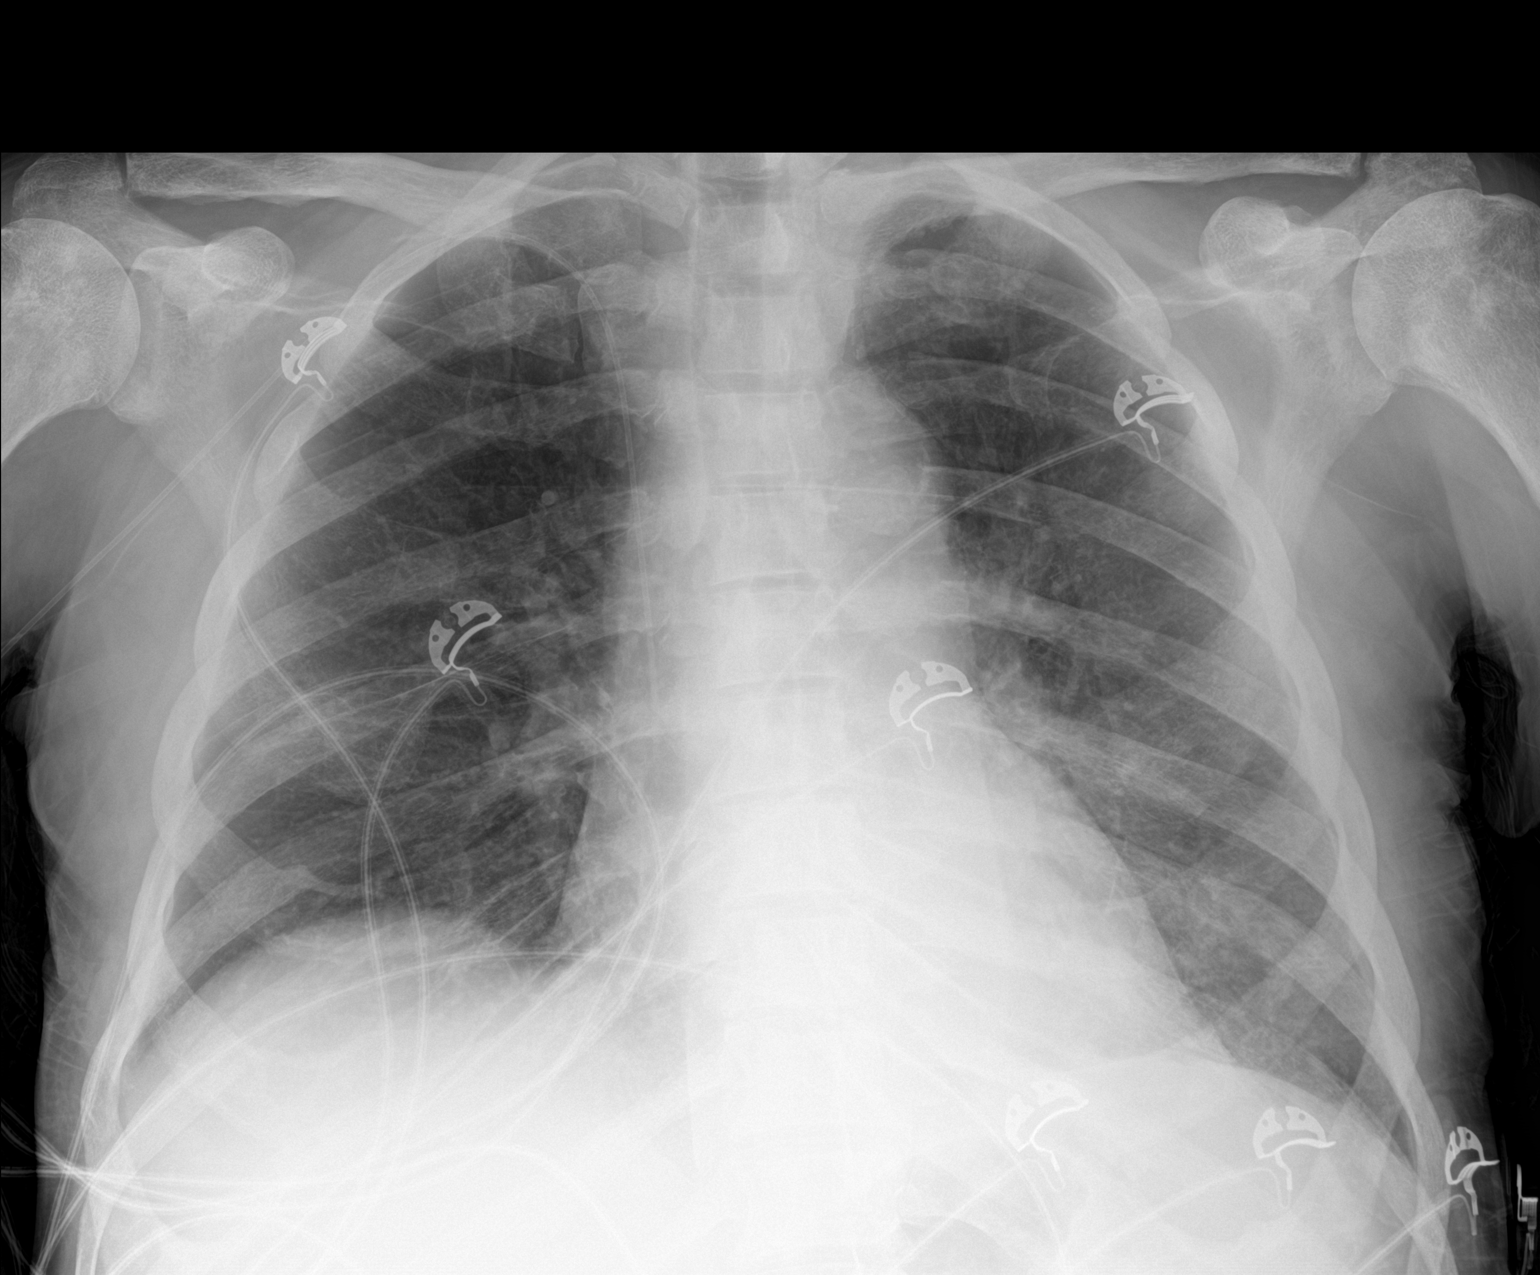

[1 of 1 positions shown; findings below may reference images not displayed]

FINDINGS: PICC line noted with tip over superior vena cava. Heart size normal.
Left lower lobe infiltrate consistent pneumonia. No pleural effusion
or pneumothorax. No acute bony abnormality.
IMPRESSION: 1.  PICC line noted with tip over superior vena cava.

2.  Left lower lobe infiltrate consistent pneumonia.

## 2019-07-31 IMAGING — CT CT ABD-PELV W/O
2 of 4 series · 12 of 36 positions shown, 15 images · non-contrast
Comparison: Chest CT 10/20/2018

CLINICAL DATA: Patient found on the floor, hypotensive and pale.
History of acute myelocytic leukemia.

EXAM:
CT CHEST, ABDOMEN AND PELVIS WITHOUT CONTRAST
TECHNIQUE: Multidetector CT imaging of the chest, abdomen and pelvis was
performed following the standard protocol without IV contrast.

[Series 2: cap wo st · axial · 0.78mm/px · z∈[-885,-305]mm · 9 of 140 slices shown, 12 images]
[im 12/140  mediastinal]
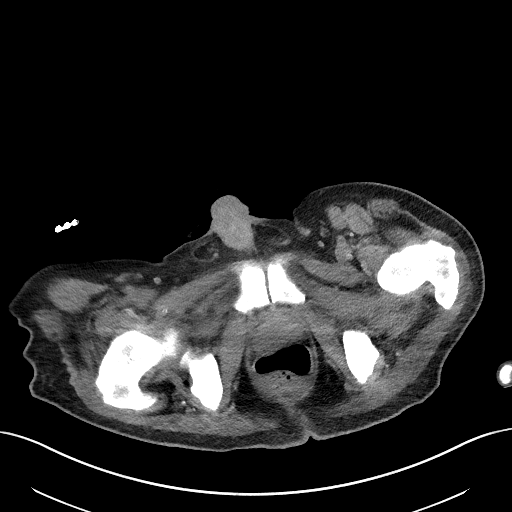
[im 12/140  lung]
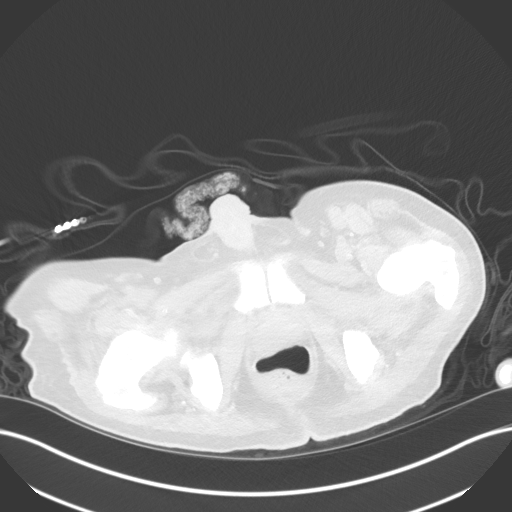
[im 24/140  lung]
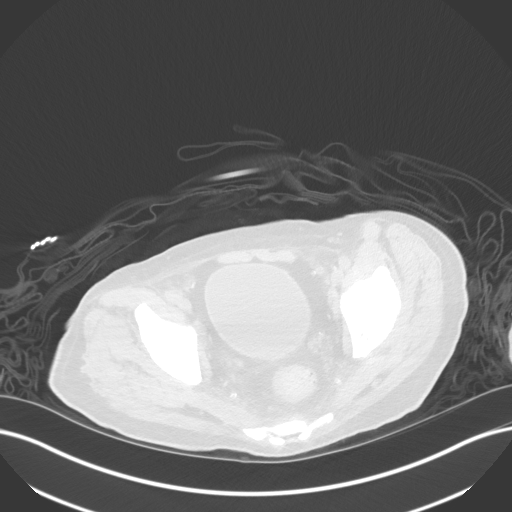
[im 47/140  lung]
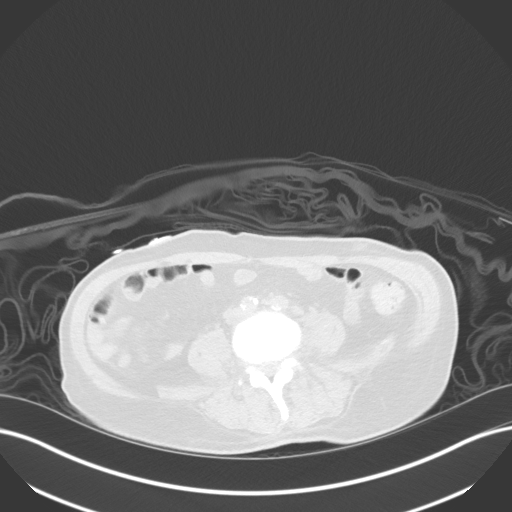
[im 58/140  lung]
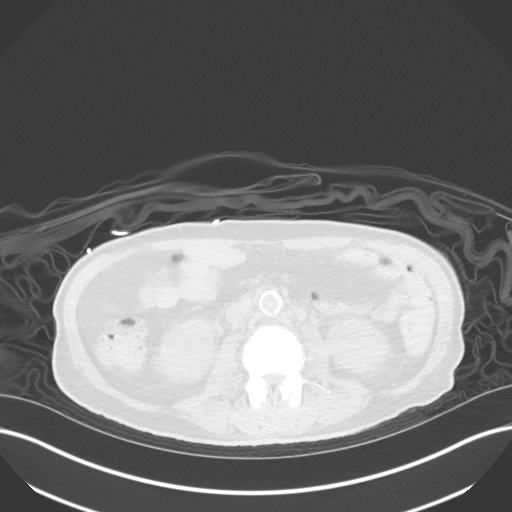
[im 70/140  mediastinal]
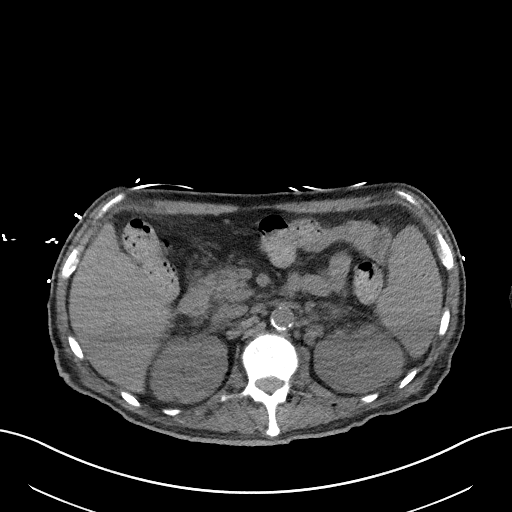
[im 70/140  lung]
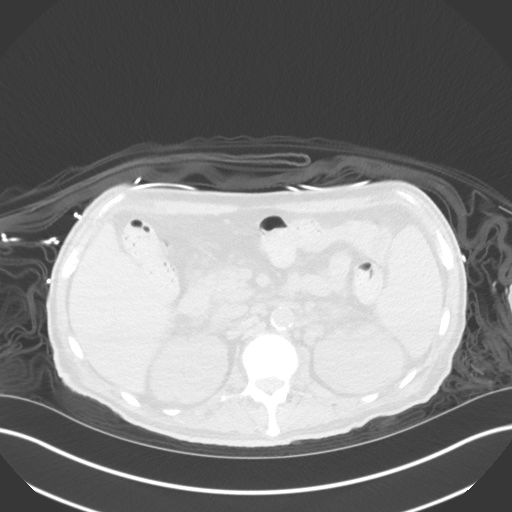
[im 82/140  lung]
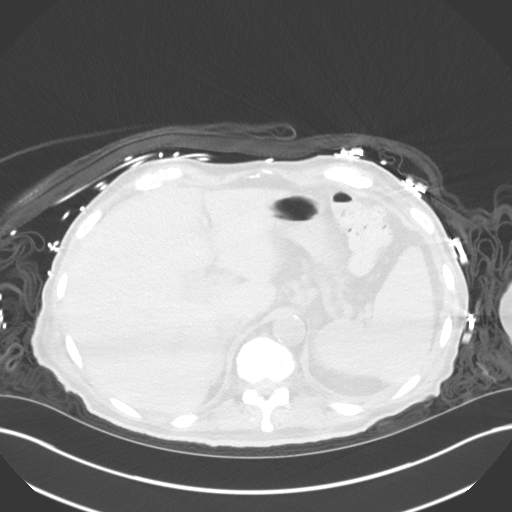
[im 93/140  lung]
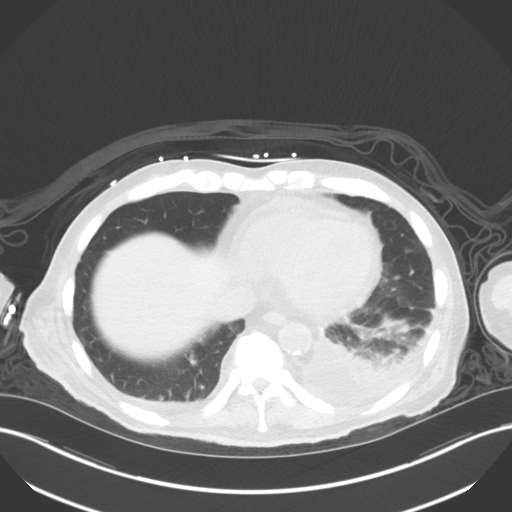
[im 116/140  lung]
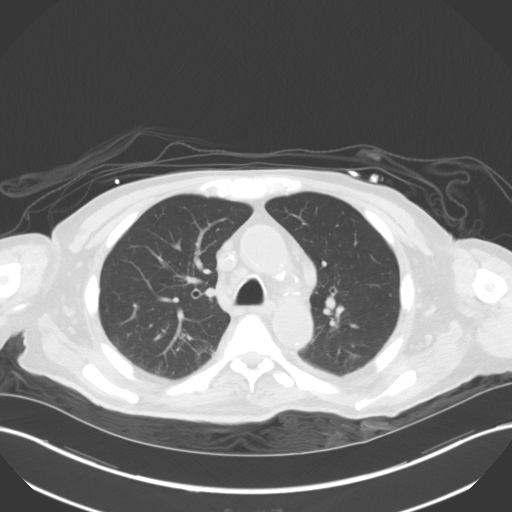
[im 128/140  mediastinal]
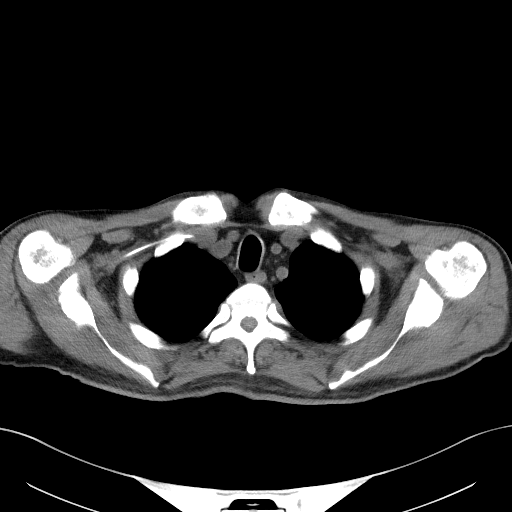
[im 128/140  lung]
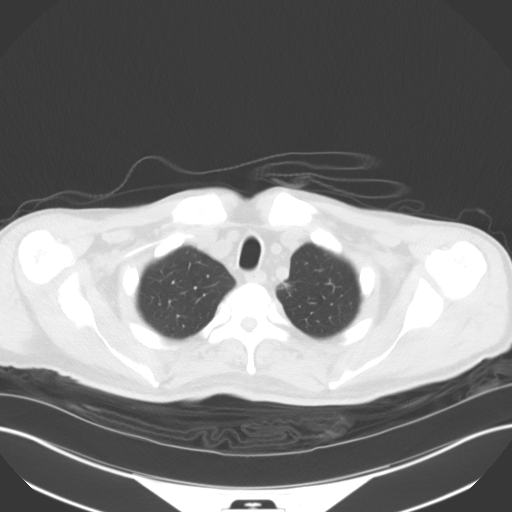

[Series 5: coronal · coronal · 0.70mm/px · 3 of 122 slices shown]
[im 25/122  lung]
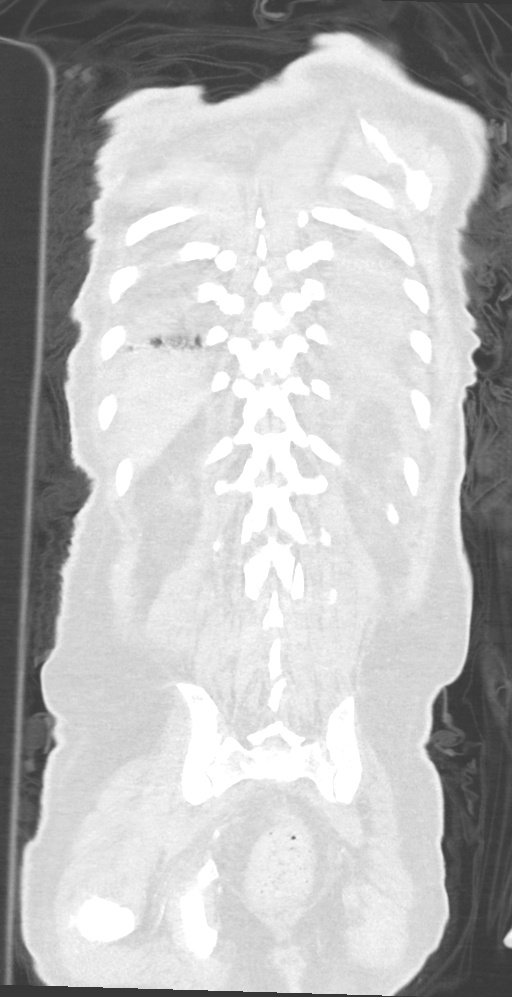
[im 49/122  lung]
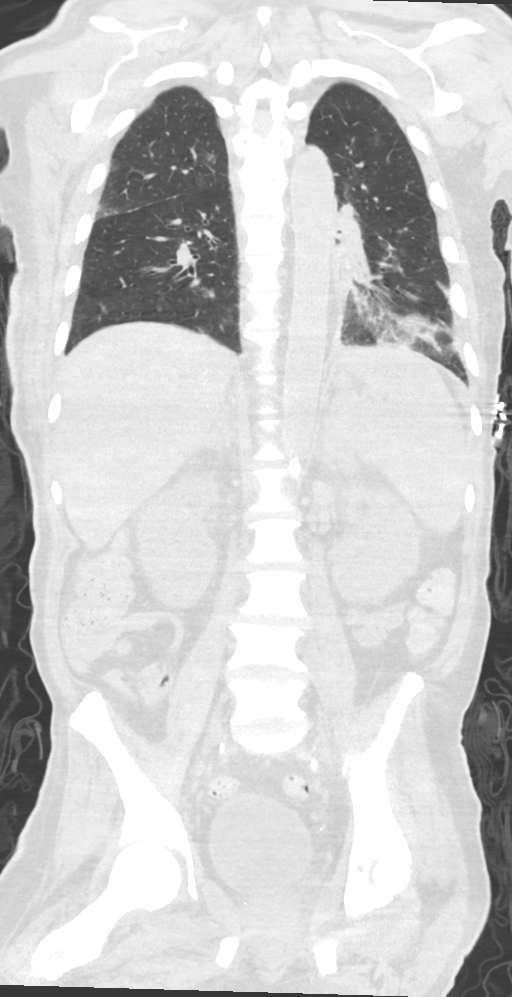
[im 73/122  lung]
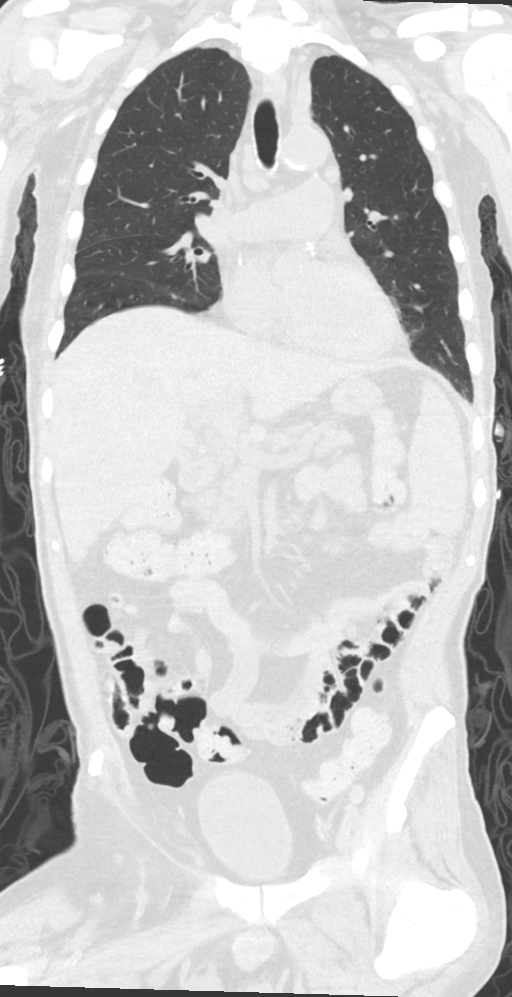

[12 of 36 positions shown; findings below may reference images not displayed]

FINDINGS: CT CHEST FINDINGS

Cardiovascular: Normal heart size. Pericardial thickening versus
effusion, measuring 9 mm in greatest transverse dimension. Calcific
atherosclerotic disease of the aorta and the coronary arteries.
Right-sided PICC line terminates at the cavoatrial junction.

Mediastinum/Nodes: Multiple borderline enlarged mediastinal lymph
nodes, slightly more prominent than on the prior exam. For instance,
a lymph node in the azygoesophageal recess measures 11 mm in short
axis, previously measured 7 mm.

Lungs/Pleura: Dense airspace consolidation in the posterior aspect
of the left lower lobe, with associated small left pleural effusion.
The left lower lobar bronchus is obliterated, which may be due to
inspissated secretions. Additional patchy areas of airspace
consolidation in the dependent portion of the right upper lobe and
centrally in the left upper lobe, and right lower lobe.

Musculoskeletal: Diffuse heterogeneous osseous sclerotic involvement
of the axial and appendicular skeleton, with increased sclerotic
appearance.

CT ABDOMEN PELVIS FINDINGS

Hepatobiliary: No focal liver abnormality is seen. No gallstones,
gallbladder wall thickening, or biliary dilatation.

Pancreas: Unremarkable. No pancreatic ductal dilatation or
surrounding inflammatory changes.

Spleen: Normal in size without focal abnormality.

Adrenals/Urinary Tract: Diffuse thickening of the bilateral adrenal
glands. Featureless appearance of the bilateral kidneys. No
nephrolithiasis or hydronephrosis. Normal appearance of the urinary
bladder.

Stomach/Bowel: Stomach is within normal limits. Appendix appears
normal. No evidence of bowel wall thickening, distention, or
inflammatory changes.

Vascular/Lymphatic: Numerous abnormal lymph nodes in the
retroperitoneum and central mesentery.

Reproductive: Prostate is unremarkable.

Other: No abdominal wall hernia or abnormality. No abdominopelvic
ascites.

Musculoskeletal: Widespread diffuse skeletal metastatic disease with
more sclerotic appearance. No pathologic fractures are seen.
IMPRESSION: Left lower lobe consolidation with significant element of
atelectasis, secondary to obstruction of the left lower lobar
bronchus, likely due to inspissated secretions.

Other scattered areas of airspace consolidation in the bilateral
upper lobes and right lower lobe, likely infectious.

Pericardial effusion, measuring 9 mm in greatest thickness.

Numerous abnormal lymph nodes in the mediastinum and retroperitoneum
suggestive of progression of disease. Thickening of the bilateral
adrenal glands and featureless appearance of the bilateral kidneys,
possibly also due to involvement with leukemia.

More prominent appearance of diffuse widespread sclerotic skeletal
metastatic disease. No pathologic fractures are seen.
